# Patient Record
Sex: Male | Born: 1940 | Race: White | Hispanic: No | Marital: Married | Smoking: Never smoker
Health system: Southern US, Community
[De-identification: ages and names within clinical notes are randomized; demographics above are authoritative.]

## PROBLEM LIST (undated history)

## (undated) DIAGNOSIS — I2510 Atherosclerotic heart disease of native coronary artery without angina pectoris: Secondary | ICD-10-CM

## (undated) DIAGNOSIS — R410 Disorientation, unspecified: Secondary | ICD-10-CM

## (undated) DIAGNOSIS — Z9114 Patient's other noncompliance with medication regimen: Secondary | ICD-10-CM

## (undated) DIAGNOSIS — I4891 Unspecified atrial fibrillation: Secondary | ICD-10-CM

## (undated) DIAGNOSIS — Z91148 Patient's other noncompliance with medication regimen for other reason: Secondary | ICD-10-CM

## (undated) DIAGNOSIS — I219 Acute myocardial infarction, unspecified: Secondary | ICD-10-CM

## (undated) DIAGNOSIS — J90 Pleural effusion, not elsewhere classified: Secondary | ICD-10-CM

## (undated) DIAGNOSIS — I639 Cerebral infarction, unspecified: Secondary | ICD-10-CM

## (undated) DIAGNOSIS — M79606 Pain in leg, unspecified: Secondary | ICD-10-CM

## (undated) DIAGNOSIS — E785 Hyperlipidemia, unspecified: Secondary | ICD-10-CM

## (undated) DIAGNOSIS — E119 Type 2 diabetes mellitus without complications: Secondary | ICD-10-CM

## (undated) DIAGNOSIS — R600 Localized edema: Secondary | ICD-10-CM

## (undated) DIAGNOSIS — I255 Ischemic cardiomyopathy: Secondary | ICD-10-CM

## (undated) DIAGNOSIS — I10 Essential (primary) hypertension: Secondary | ICD-10-CM

## (undated) DIAGNOSIS — D369 Benign neoplasm, unspecified site: Secondary | ICD-10-CM

## (undated) DIAGNOSIS — G8929 Other chronic pain: Secondary | ICD-10-CM

## (undated) DIAGNOSIS — D7589 Other specified diseases of blood and blood-forming organs: Secondary | ICD-10-CM

## (undated) DIAGNOSIS — I5022 Chronic systolic (congestive) heart failure: Secondary | ICD-10-CM

## (undated) HISTORY — DX: Benign neoplasm, unspecified site: D36.9

## (undated) HISTORY — DX: Hyperlipidemia, unspecified: E78.5

## (undated) HISTORY — DX: Other specified diseases of blood and blood-forming organs: D75.89

## (undated) HISTORY — DX: Cerebral infarction, unspecified: I63.9

## (undated) HISTORY — DX: Ischemic cardiomyopathy: I25.5

## (undated) HISTORY — PX: CORONARY ARTERY BYPASS GRAFT: SHX141

## (undated) HISTORY — PX: TONSILLECTOMY: SUR1361

## (undated) HISTORY — DX: Unspecified atrial fibrillation: I48.91

---

## 2000-09-24 ENCOUNTER — Inpatient Hospital Stay (HOSPITAL_COMMUNITY): Admission: AD | Admit: 2000-09-24 | Discharge: 2000-10-09 | Payer: Self-pay | Admitting: Cardiology

## 2000-09-24 ENCOUNTER — Encounter: Payer: Self-pay | Admitting: *Deleted

## 2000-09-25 ENCOUNTER — Encounter: Payer: Self-pay | Admitting: *Deleted

## 2000-09-28 ENCOUNTER — Encounter: Payer: Self-pay | Admitting: Thoracic Surgery (Cardiothoracic Vascular Surgery)

## 2000-09-29 ENCOUNTER — Encounter: Payer: Self-pay | Admitting: Thoracic Surgery (Cardiothoracic Vascular Surgery)

## 2000-09-30 ENCOUNTER — Encounter: Payer: Self-pay | Admitting: Thoracic Surgery (Cardiothoracic Vascular Surgery)

## 2000-10-01 ENCOUNTER — Encounter: Payer: Self-pay | Admitting: Thoracic Surgery (Cardiothoracic Vascular Surgery)

## 2000-10-02 ENCOUNTER — Encounter: Payer: Self-pay | Admitting: Thoracic Surgery (Cardiothoracic Vascular Surgery)

## 2000-10-03 ENCOUNTER — Encounter: Payer: Self-pay | Admitting: Thoracic Surgery (Cardiothoracic Vascular Surgery)

## 2000-10-04 ENCOUNTER — Encounter: Payer: Self-pay | Admitting: Cardiothoracic Surgery

## 2000-10-17 ENCOUNTER — Inpatient Hospital Stay (HOSPITAL_COMMUNITY): Admission: EM | Admit: 2000-10-17 | Discharge: 2000-10-20 | Payer: Self-pay | Admitting: Cardiology

## 2000-11-04 ENCOUNTER — Encounter: Payer: Self-pay | Admitting: Thoracic Surgery (Cardiothoracic Vascular Surgery)

## 2000-11-04 ENCOUNTER — Encounter
Admission: RE | Admit: 2000-11-04 | Discharge: 2000-11-04 | Payer: Self-pay | Admitting: Thoracic Surgery (Cardiothoracic Vascular Surgery)

## 2006-07-27 ENCOUNTER — Ambulatory Visit: Payer: Self-pay | Admitting: Cardiology

## 2006-08-03 ENCOUNTER — Ambulatory Visit: Payer: Self-pay | Admitting: Cardiology

## 2006-09-03 ENCOUNTER — Ambulatory Visit: Payer: Self-pay | Admitting: Cardiology

## 2006-09-22 HISTORY — PX: CHOLECYSTECTOMY: SHX55

## 2009-09-15 ENCOUNTER — Encounter (INDEPENDENT_AMBULATORY_CARE_PROVIDER_SITE_OTHER): Payer: Self-pay | Admitting: Internal Medicine

## 2009-09-15 ENCOUNTER — Inpatient Hospital Stay (HOSPITAL_COMMUNITY): Admission: EM | Admit: 2009-09-15 | Discharge: 2009-09-18 | Payer: Self-pay | Admitting: Emergency Medicine

## 2009-09-15 ENCOUNTER — Ambulatory Visit: Payer: Self-pay | Admitting: Cardiology

## 2009-09-17 ENCOUNTER — Encounter (INDEPENDENT_AMBULATORY_CARE_PROVIDER_SITE_OTHER): Payer: Self-pay | Admitting: Internal Medicine

## 2009-09-18 ENCOUNTER — Encounter (INDEPENDENT_AMBULATORY_CARE_PROVIDER_SITE_OTHER): Payer: Self-pay | Admitting: Internal Medicine

## 2009-09-18 ENCOUNTER — Ambulatory Visit: Payer: Self-pay | Admitting: Vascular Surgery

## 2010-12-23 LAB — ETHANOL

## 2010-12-23 LAB — GLUCOSE, CAPILLARY
Glucose-Capillary: 231 mg/dL — ABNORMAL HIGH (ref 70–99)
Glucose-Capillary: 257 mg/dL — ABNORMAL HIGH (ref 70–99)
Glucose-Capillary: 268 mg/dL — ABNORMAL HIGH (ref 70–99)
Glucose-Capillary: 306 mg/dL — ABNORMAL HIGH (ref 70–99)
Glucose-Capillary: 309 mg/dL — ABNORMAL HIGH (ref 70–99)
Glucose-Capillary: 309 mg/dL — ABNORMAL HIGH (ref 70–99)
Glucose-Capillary: 321 mg/dL — ABNORMAL HIGH (ref 70–99)
Glucose-Capillary: 332 mg/dL — ABNORMAL HIGH (ref 70–99)
Glucose-Capillary: 334 mg/dL — ABNORMAL HIGH (ref 70–99)
Glucose-Capillary: 351 mg/dL — ABNORMAL HIGH (ref 70–99)
Glucose-Capillary: 406 mg/dL — ABNORMAL HIGH (ref 70–99)
Glucose-Capillary: 417 mg/dL — ABNORMAL HIGH (ref 70–99)

## 2010-12-23 LAB — LIPID PANEL
Cholesterol: 114 mg/dL (ref 0–200)
LDL Cholesterol: 49 mg/dL (ref 0–99)
Total CHOL/HDL Ratio: 2 RATIO
Total CHOL/HDL Ratio: 2.4 RATIO
Triglycerides: 62 mg/dL (ref <150)
Triglycerides: 84 mg/dL (ref <150)
VLDL: 12 mg/dL (ref 0–40)
VLDL: 17 mg/dL (ref 0–40)

## 2010-12-23 LAB — CK TOTAL AND CKMB (NOT AT ARMC)
CK, MB: 4.7 ng/mL — ABNORMAL HIGH (ref 0.3–4.0)
CK, MB: 5.9 ng/mL — ABNORMAL HIGH (ref 0.3–4.0)
Relative Index: 2.9 — ABNORMAL HIGH (ref 0.0–2.5)
Total CK: 183 U/L (ref 7–232)
Total CK: 205 U/L (ref 7–232)

## 2010-12-23 LAB — BASIC METABOLIC PANEL
CO2: 21 mEq/L (ref 19–32)
Calcium: 8.6 mg/dL (ref 8.4–10.5)
Chloride: 101 mEq/L (ref 96–112)
Chloride: 111 mEq/L (ref 96–112)
Creatinine, Ser: 2.07 mg/dL — ABNORMAL HIGH (ref 0.4–1.5)
Creatinine, Ser: 2.26 mg/dL — ABNORMAL HIGH (ref 0.4–1.5)
GFR calc Af Amer: 35 mL/min — ABNORMAL LOW (ref >60)
GFR calc Af Amer: 39 mL/min — ABNORMAL LOW (ref >60)
GFR calc Af Amer: 42 mL/min — ABNORMAL LOW (ref >60)
GFR calc non Af Amer: 29 mL/min — ABNORMAL LOW (ref >60)
GFR calc non Af Amer: 32 mL/min — ABNORMAL LOW (ref >60)
Potassium: 3.8 mEq/L (ref 3.5–5.1)
Sodium: 140 mEq/L (ref 135–145)

## 2010-12-23 LAB — URINALYSIS, ROUTINE W REFLEX MICROSCOPIC
Bilirubin Urine: NEGATIVE
Hgb urine dipstick: NEGATIVE
Ketones, ur: NEGATIVE mg/dL
Specific Gravity, Urine: 1.018 (ref 1.005–1.030)
Urobilinogen, UA: 0.2 mg/dL (ref 0.0–1.0)

## 2010-12-23 LAB — HEMOCCULT GUIAC POC 1CARD (OFFICE): Fecal Occult Bld: NEGATIVE

## 2010-12-23 LAB — CBC
HCT: 35.2 % — ABNORMAL LOW (ref 39.0–52.0)
HCT: 35.4 % — ABNORMAL LOW (ref 39.0–52.0)
Hemoglobin: 12.4 g/dL — ABNORMAL LOW (ref 13.0–17.0)
MCHC: 34.1 g/dL (ref 30.0–36.0)
MCHC: 34.3 g/dL (ref 30.0–36.0)
MCHC: 35 g/dL (ref 30.0–36.0)
MCHC: 35.3 g/dL (ref 30.0–36.0)
MCV: 107.6 fL — ABNORMAL HIGH (ref 78.0–100.0)
MCV: 108.3 fL — ABNORMAL HIGH (ref 78.0–100.0)
Platelets: 119 10*3/uL — ABNORMAL LOW (ref 150–400)
RBC: 3.24 MIL/uL — ABNORMAL LOW (ref 4.22–5.81)
RBC: 3.29 MIL/uL — ABNORMAL LOW (ref 4.22–5.81)
RDW: 13.1 % (ref 11.5–15.5)
RDW: 13.2 % (ref 11.5–15.5)
WBC: 7.2 10*3/uL (ref 4.0–10.5)
WBC: 8 10*3/uL (ref 4.0–10.5)
WBC: 9.4 10*3/uL (ref 4.0–10.5)

## 2010-12-23 LAB — COMPREHENSIVE METABOLIC PANEL
AST: 176 U/L — ABNORMAL HIGH (ref 0–37)
Albumin: 3.5 g/dL (ref 3.5–5.2)
BUN: 55 mg/dL — ABNORMAL HIGH (ref 6–23)
Calcium: 8.5 mg/dL (ref 8.4–10.5)
Creatinine, Ser: 2.68 mg/dL — ABNORMAL HIGH (ref 0.4–1.5)
Total Protein: 6.5 g/dL (ref 6.0–8.3)

## 2010-12-23 LAB — CARDIAC PANEL(CRET KIN+CKTOT+MB+TROPI)
Total CK: 156 U/L (ref 7–232)
Troponin I: 0.1 ng/mL — ABNORMAL HIGH (ref 0.00–0.06)

## 2010-12-23 LAB — FOLATE RBC: RBC Folate: 1727 ng/mL — ABNORMAL HIGH (ref 180–600)

## 2010-12-23 LAB — URINE CULTURE

## 2010-12-23 LAB — APTT: aPTT: 27 seconds (ref 24–37)

## 2010-12-23 LAB — TROPONIN I

## 2010-12-23 LAB — DIFFERENTIAL
Basophils Relative: 0 % (ref 0–1)
Lymphocytes Relative: 18 % (ref 12–46)
Lymphs Abs: 1.1 10*3/uL (ref 0.7–4.0)
Monocytes Absolute: 0.4 10*3/uL (ref 0.1–1.0)
Monocytes Relative: 7 % (ref 3–12)
Neutro Abs: 4.6 10*3/uL (ref 1.7–7.7)
Neutrophils Relative %: 73 % (ref 43–77)

## 2010-12-23 LAB — RAPID URINE DRUG SCREEN, HOSP PERFORMED
Benzodiazepines: NOT DETECTED
Opiates: NOT DETECTED

## 2010-12-23 LAB — AMMONIA: Ammonia: 31 umol/L (ref 11–35)

## 2010-12-23 LAB — PROTIME-INR: INR: 1.1 (ref 0.00–1.49)

## 2011-02-07 NOTE — Op Note (Signed)
[place]. [facility]  Patient:    Chris [name], Chris [name]                          MRN: [id number] Proc. Date: 09/28/00 Adm. Date:  [id number] Attending:  Learta Codding CC:         Veneda Melter, M.D. 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  OPERATION PERFORMED:  Median sternotomy, extracorporeal circulation, coronary artery bypass grafting x 4 (left internal mammary artery to left anterior descending, saphenous vein graft to first diagonal, sequential saphenous vein graft to posterior descending and posterolateral #2).  SURGEON:  Salvatore Decent. Dorris Fetch, M.D.  ASSISTANT:  Maxwell Marion, RN, FA  ANESTHESIA:  General.  FINDINGS:  Cardiomegaly with biventricular hypertrophy, dilatation, anterior apical scar, inferior scar, left anterior descending fair quality, small target. Main target is good quality but overall diffusely diseased coronaries.  INDICATIONS FOR PROCEDURE:  Chris [name] is a 70 year old diabetic gentleman with known three-vessel coronary disease who presented status post an acute myocardial infarction.  Cardiac catheterization revealed progression of three-vessel disease now with left main disease as well. There was ischemic cardiomyopathy with an ejection fraction of 30% and end diastolic pressure of 30.  The patient was referred for coronary artery bypass grafting. He was moderately high risk secondary to his comorbidities and his cardiac dysfunction.  The indications, risks, benefits and alternatives to the procedure were discussed in detail with the patient and his family.  They accepted the risks as outlined in his chart and agreed to proceed.  DESCRIPTION OF PROCEDURE:  Chris [name] was brought to the preop holding area on September 28, 2000.  Lines were placed to monitor arterial, central venous  and pulmonary arterial pressure.  EKG leads were placed for continuous telemetry. The patient was taken to the operating room, anesthetized and intubated.  A Foley catheter was placed.  Intravenous antibiotics were administered and the chest, abdomen and legs were prepped and draped in the usual fashion.  A median sternotomy was performed. Simultaneously incision was made in the medial aspect of the right leg and the greater saphenous vein was harvested in a standard fashion from the groin to the knee.  Prior to making incisions, a left femoral arterial line had been placed using a Seldinger technique.  The left internal mammary artery was harvested using standard technique.  The patient had a very stiff, noncompliant chest wall.  Mammary was of good quality and was harvested without undue difficulty.  The patient was fully heparinized prior to dividing the distal end of the mammary artery.  There was good flow through the cut end of the vessel.  The mammary artery was placed in a papaverine soaked sponge and placed into the above pleural space.  The pericardium was opened.  The ascending aorta was inspected.  There was no palpable atherosclerotic disease.  The aorta was cannulated via a concentric 2-0 Ethibond pledgeted pursestring sutures.  The dual stage venous cannula was placed via pursestring suture in the right atrial appendage.  Cardiopulmonary bypass was instituted and the patient was cooled to 32 degrees Celsius.  The coronary arteries were inspected and anastomotic sites were chosen.  The conduits were inspected and cut to length and a foam pad was placed in the pericardium to protect the phrenic nerve.  A temperature probe was placed in the myocardial  septum and a cardioplegia cannula was placed in the ascending aorta.  The sites chosen for grafting were the posterior descending vessel awa the large second posterolateral, large anterolateral diagonal branch and the LAD.  The  OM1 from the circumflex was too small to graft.  All vessels that had diffuse disease.  Only the LAD was significantly diseased at the site of the anastomosis.  The aorta was crossclamped.  The left ventricle was ____________ aortic root vent.  Cardiac arrest was then achieved with a combination of cold antegrade blood cardioplegia and topical iced saline.  After achieving a complete diastolic arrest and myocardial septal temperature of 10 degrees Celsius, the following distal anastomoses were performed.  First a reversed saphenous vein was placed sequentially to the posterior descending and second posterolateral branch.  The posterior descending was a 1.5 mm vessel.  It was good quality at the site of the anastomosis.  A side-to-side anastomosis was performed to the vein graft using a running 7-0 Prolene suture.  There was excellent flow through this anastomosis.  An end-to-side anastomosis then was performed of PL2 again using a running 7-0 Prolene suture.  Again PL2 was also 1.5 mm and good quality at the site of the anastomosis although it did have some significant distal disease.  At the completion of the second anastomosis, cardioplegia was administered down the graft.  There was good flow and good hemostasis.  Next, a reversed saphenous vein graft was placed end-to-side to the first diagonal branch of the LAD.  This was a large dominant anterolateral vessel which along with the popsterolateral branch supplied most of the lateral wall of the heart.  It was 1.5 mm in diameter at the site of the anastomosis.  It bifurcated distal to the anastomosis.  A 1.5 mm probe would only pass down one limb of the bifurcation.  The vessel was good quality at the site where the anastomosis was performed.  However, the anastomosis was done with a running 7-0 Prolene suture.  There was very good flow through the graft.  Cardioplegia was administered and there was good hemostasis.  Next, the left  internal mammary artery was brought through a window in the  pericardium anterior to the left phrenic nerve.  The distal end of the mammary was spatulated and was anastomosed end-to-side to the mid-LAD using a running 8-0 Prolene suture.  The LAD was only fair quality at the site of the anastomosis.  There was lateral plaque.  There was significant disease both proximal and distal to the anastomosis but a 1 mm probe did pass in both directions.  The anastomosis was performed with a running 8-0 Prolene suture. At the completion of the anastomosis, a bulldog clamp was removed.  Immediate and rapid septal rewarming was noted.  There was a small leak from the heel which was controlled with a single 8-0 Prolene suture.  The mammary pedicle was tacked to the epicardial surface of the heart with 6-0 Prolene sutures. The aortic crossclamp was removed.  Lidocaine was administered.  The total crossclamp time was 45 minutes.  The patient required two defibrillations with 20 joules before resuming sinus rhtyhm.  The vein grafts were cut to length.  A partial occlusion clamp was placed on the ascending aorta.  The cardioplegia cannula was removed.  The proximal vein graft anastomoses were performed at 4.4 mm punch aortotomies with running 6-0 Prolene sutures.  At the completion of the final proximal anastomosis, the patient was placed in Trendelenburg position.  Air was allowed to vent.  The partial clamp was removed.  Air was aspirated from the vein grafts and the bulldog clamps were removed.  All proximal and distal anastomoses were inspected for hemostasis.  The patient was loaded with milrinone during the proximal anastomoses and also rewarming was begun.  A dopamine drip was initiated just before coming off bypass.  Epicardial pacing wires were placed on the right ventricle and right atrium after the patient had been rewarmed to 37 degrees Celsius and all anastomoses were inspected  for hemostasis.  The patient was weaned from cardiopulmonary bypass on milrinone and dopamine drips and milrinone was at 0.3 and dopamine at 5 mcg/kg per minute.  The total bypass time was 106 minutes.  Transesophageal echocardiogram performed postbypass revealed no change in mild aortic insufficiency and mitral regurgitation.  There was slight improvement of ventricular function consistent with the ionotropic support.  A test dose of protamine was administered and was well tolerated.  The atrial and aortic cannulae were removed.  The remainder of the protamine was administered without incident.  The chest was irrigated with 1L of warm normal saline containing 1 gm of vancomycin.  Hemostasis was achieved.  The pericardium was reapproximated with interrupted 3-0 silk sutures.  The chest was drained with left pleural and two mediastinal chest tubes.  The sternum was closed with heavy gauge interrupted stainless steel wires.  The pectoralis fascia was closed wtih a running #1 Vicryl suture.  The subcutaneous tissues in both incisions was closed with running 2-0 Vicryl suture and the skin was closed with 3-0 Vicryl sutures.  Sponge, needle and instrument counts were correct at the end of the procedure.  The patient was in stable condition and was transported to the surgical intensive care unit. DD:  09/28/00 TD:  09/28/00 Job: [id number]

## 2011-02-07 NOTE — Procedures (Signed)
[place]. [facility]  Patient:    Chris [name], Chris [name]                          MRN: [id number] Proc. Date: 10/02/00 Adm. Date:  [id number] Attending:  Charlett Lango CC:         Anesthesia Department   Procedure Report  PROCEDURE:  Transesophageal echocardiographic monitoring during coronary artery bypass grafting.  BODY OF REPORT:  I was consulted by Dr. Andrey Spearman to place a transesophageal echocardiographic probe into Chris [name] because of a decreased LV function.  The patient was undergoing coronary artery bypass grafting and with films indicated to monitoring using the transesophageal echocardiographic probe during the surgery.  After uneventful monitoring line insertion, induction of anesthesia and endotracheal intubation, I passed the transesophageal echocardiographic probe easily into the stomach.  Initial short-axis view of the left ventricle showed some anterior hypokinesis, especially down towards the apex.  Other quadrants seemed to move pretty well.  Mitral valve was normal.  Aortic valve was normal.  Left ventricular outflow tract was normal.   Right side of the heart was normal.  Interatrial septum was normal.  Flow in the pulmonary veins was forward.  Patient was placed on cardiopulmonary bypass by Dr. Dorris Fetch and after the procedure was completed and prior to discontinuing cardiopulmonary bypass, the left ventricle looked unchanged from preoperatively.  The rest of the structures were within normal limits.  Cardiopulmonary bypass was discontinued uneventfully and the rest of the course was uneventful.  At the end of the procedure, the echocardiographic probe was removed from the patient and he was taken to the intensive care unit. DD:  10/02/00 TD:  10/03/00 Job: [id number]

## 2011-02-07 NOTE — Discharge Summary (Signed)
[place]. [facility]  Patient:    Chris [name], Chris [name]                          MRN: [id number] Adm. Date:  [id number] Attending:  Charlett Lango Dictator:   Sherrie George, P.A. CC:         Weyman Pedro, M.D.  Veneda Melter, M.D. [facility]   Referring Physician Discharge [place]  DATE OF BIRTH:  1941-03-30  ADMITTING PHYSICIAN:  Lewayne Bunting, M.D.  PRIMARY CARE PHYSICIAN:  Weyman Pedro, M.D.  CARDIOLOGIST OF RECORD:  Veneda Melter, M.D.  ADMISSION DIAGNOSES: 1. Chest pain, rule out myocardial infarction, status post myocardial    infarction 1993. 2. Adult onset diabetes mellitus x 50 years with a history of poor control. 3. Diabetic retinopathy. 4. Hypertension. 5. Hyperlipidemia. 6. Gastroesophageal reflux disease. 7. Questionable cerebrovascular accident with no residual in 1994.  DISCHARGE DIAGNOSES: 1. Acute myocardial infarction with three-vessel coronary artery disease;    ejection fraction 33%, 99% and 100% right coronary artery stenoses, 50%    left main, 80% left anterior descending artery, subtotal left circumflex    after the first obtuse marginal. 2. Adult onset diabetes mellitus x 50 years with poor control. 3. Diabetic retinopathy, diabetic gastroparesis, and bilateral diabetic    peripheral neuropathy. 4. Hypertension. 5. Gastroesophageal reflux disease. 6. Postoperative acute tubular necrosis, resolving, with creatinine elevation    up to 3.5. 7. Hyperlipidemia.  PROCEDURES: 1. Cardiac catheterization on September 23, 2000 - Dr. Andee Lineman. 2. Coronary artery bypass grafting x 4 with left internal mammary to the LAD,    saphenous vein graft to the diagonal, saphenous vein graft to the    posterior descending and posterolateral on September 29, 2000 by    Dr. Dorris Fetch.  BRIEF HISTORY:  The patient is a 70 year old white male transferred to [facility] from [facility] where he was evaluated in the ER for chest pain.  He had  a significant myocardial infarction in 1993.  He was catheterized at [facility] and CABG was recommended; however, the patient declined, opting for medical therapy.  He also has a history of hyperlipidemia, gastroesophageal reflux disease, tonsillectomy, hypertension, retinopathy, and peripheral vascular occlusive disease.  Patient states no heart trouble since 1993 until the night prior to admission.  He thought he had indigestion.  He woke up around 1 a.m. and took Axid without relief.  He got up and walked around.  The pain continued as a dull ache to the right of the sternum, painful to touch.  He did not take nitroglycerin.  He had shortness of breath, mild nausea, and diaphoresis.  ALLERGIES:  None known.  MEDICATIONS ON ADMISSION: 1. A.m. insulin is NPH 62 and 10 of regular. 2. P.m. insulin is NPH 24 and 10 of regular. 3. Niacin 500 mg t.i.d. 4. Captopril 100 mg p.o. b.i.d. 5. Pravachol 10 mg q.d. 6. Multivitamin. 7. Lasix 10 mg Saturday, Sunday, Monday, Wednesday, and Friday. 8. Aspirin 325 mg q.d. 9. Lopressor 100 mg b.i.d.  For further history and physical, please see the dictated note.  HOSPITAL COURSE:  Patient was admitted for observation and further treatment. CPKs were positive despite no significant changes on the EKG.  Patient was judged to have a subendocardial myocardial infarction and underwent cardiac catheterization with the above-noted results and an ejection fraction of 33%. At that point, patient was referred to CVTS and Dr. Charlett Lango for evaluation.  He evaluated  the patient, noted that with a 50% left main and unstable angina along with three-vessel disease, the best option would be the coronary artery bypass grafting.  The risks and benefits were discussed in detail and informed operative consent was obtained.  The patient was stabilized and then taken to the operating room on September 28, 2000.  Patient tolerated the procedure well and  returned to the ICU.  During transportation, he became hypotensive with systolic blood pressures down to the 40s to 50s and hypoxic with low saturations.  Heart rate was in the 70 to 80 range and sinus. It was noted that the sleeve had been pulled out of the IJA.  He was resuscitated by Dr. Dorris Fetch and stabilized overnight.  First postoperative morning, cardiac index was 2.5, urine output was 30-40 cc/hour.  Creatinine was up to 2.7, potassium was 5.  Hemoglobin was 9.3, hematocrit 24.6.  Patient was kept in the ICU for monitoring for ATN and was treated with diuretics.  He showed slow, steady improvement over the next several days after his creatinine peaked at 3.5 on postoperative day #2.  He also had some postoperative GI problems secondary to his chronic gastroparesis.  This was treated with Reglan and he showed slow, steady improvement with this.  Insulin control was also a problem and this was initially treated using ???Glucomander protocol and then as he was taking p.o. intake, his insulin was increased with his p.o. intake based on daily CBGs.  Postoperatively, the patient also developed atrial fibrillation.  This was initially treated with amiodarone and Cardizem drips.  He converted and was maintained on amiodarone.  He was also placed back on his beta blockers.  Patient has a bilateral peripheral neuropathy and a rolling walker was obtained along with a PT consult was requested but I cannot see where they have actually seen the patient.  A home health restorative nurse has been requested.  Patient has shown steady improvement by October 08, 2000.  His wounds are healing nicely.  He has some mild edema in his lower extremities although he is below his preoperative weight at 250 - his preoperative weight was 255.  He is alert, he is tolerating his diet well on the Reglan.  His glucoses are markedly improved with CBGs now in the range of 70 to 136 this a.m.  Overall, he is doing  well.  His telemetry shows sinus rhythm and his blood pressure is quite stable in the range of 130-150 systolic.  O2 saturations on room air are 93% and it was Dr. Robby Sermon opinion if he continues to do well, he could be discharged in the a.m. October 09, 2000.  He has been followed on a daily basis by Dr. Chales Abrahams, who agrees, and has increased his ACE today.  DISCHARGE MEDICATIONS:  At this point are as follows:  1. Metamucil at home as before.  2. Cordarone 200 mg q.12h.  3. Lopressor 100 mg q.12h.  4. NPH insulin 62 units in the a.m. and 22 units in the p.m.  5. Regular insulin 10 units in the a.m. and 8 units in the p.m.  6. Coated aspirin 81 mg q.d.  7. Altace 5 mg q.d.  8. Reglan 10 mg one before meals and h.s.  9. Protonix 40 mg p.o. q.d. 10. Tylox 1-2 p.o. q.4h. p.r.n.  ACTIVITY:  Patient is instructed to walk daily.  No lifting over 10 pounds, no driving, no strenuous activity.  DIET:  He is to maintain a diabetic  diet.  WOUND CARE:  Clean his incisions with plain soap and water.  FOLLOW-UP:  He is to see Dr. Chales Abrahams in two weeks and bring a chest x-ray from Dr. Donne Hazel office for Dr. Dorris Fetch to view.  Our office will arrange for follow-up in three weeks to see Dr. Dorris Fetch.  DISCHARGE LABORATORY:  Sodium 143, potassium 4.0, chloride 102, CO2 33, BUN 20, creatinine 1.6, glucose 89.  Hemoglobin 10.4, hematocrit 31.2, white count 9.0.  Platelets are 366,000.  CONDITION ON DISCHARGE:  Improved. DD:  10/08/00 TD:  10/09/00 Job: [id number]

## 2011-02-07 NOTE — Assessment & Plan Note (Signed)
[facility]                          [facility] OFFICE NOTE   Chris [name], Chris [name]                      MRN:          [id number]  DATE:09/03/2006                            DOB:          1941-03-02    HISTORY OF PRESENT ILLNESS:  The patient is a 70 year old male with a  history of ischemic heart disease status post coronary bypass grafting.  The patient was hospitalized on July 28, 2006 with palpitations. The  patient was ruled out for myocardial infarction. He underwent stress  testing which demonstrated an ejection fraction of 34%. The study was  interrupted by Dr. Diona Browner. There was a moderately large inferolateral  defect which was nonreversible. The ejection fraction by  echocardiography however was about 50-55%. The patient subsequently was  referred for a CardioNet monitor which showed occasional episodes of  nonsustained ventricular tachycardia. Ectopy varied between 7-12 beats  in a row. There was no sustained ventricular tachycardia however.  Overall the CardioNet monitor showed normal sinus rhythm with relatively  infrequent ectopy. The patient has been doing quite well. He however has  symptoms consistent with gallbladder disease. He states that he has  borborygmi after heavy meals and has frequent abdominal distention. He  is scheduled to undergo cholecystectomy. From a cardiac perspective  however he is stable with no chest pain or shortness of breath. He is  currently an NYHA class 2.   MEDICATIONS:  1. Metoprolol 50 mg p.o. b.i.d.  2. Aspirin 325 daily.  3. Niacin 250 b.i.d.  4. Niacin 5 mg p.o. daily.  5. Fish Oil 1000 mg p.o. daily.  6. Humulin insulin.  7. Lisinopril 40 mg a day.  8. Lovastatin 40 mg a day.  9. Magnesium 250 mg p.o. daily.  10.Lasix p.r.n.   PHYSICAL EXAMINATION:  VITAL SIGNS:  Blood pressure is 162/70, heart  rate 69 beats per minute. Weight is 230 pounds.  NECK:  Reveals normal carotid upstroke  and no carotid bruits.  LUNGS:  Clear breath sounds bilaterally.  HEART:  Regular rate and rhythm. Normal S1, S2. No murmurs, rubs or  gallops.  ABDOMEN:  Soft.  EXTREMITIES:  No cyanosis or clubbing or edema.   PROBLEM LIST:  1. Palpitations, stable.      a.     Infrequent nonsustained ventricular tachycardia by CardioNet       monitor.      b.     Ruled out for paroxysmal atrial fibrillation.      c.     Normal ejection fraction by echocardiography (50-55%).  2. Coronary artery disease.      a.     Status post coronary artery bypass grafting 2007 by Dr.       Dorris Fetch.      b.     Cardiolite November 2007 with fixed defect inferolateral       wall but no ischemia.  3. Diabetes mellitus.  4. Chronic cholecystitis requiring cholecystectomy.  5. Left pleural effusion.  6. Chronic renal insufficiency, creatinine 1.7 to 1.8.   PLAN:  1. From a cardiovascular perspective, the patient appears to  be stable      to undergo noncardiac surgery i.e. gallbladder surgery. The patient      had no significant ischemia by Cardiolite stress testing.  2. From a perspective palpitations, the patient appears to be stable      and continue on his current medical regimen. In light of his      relatively normal ejection fraction, no additional medical therapy      is required for his nonsustained ventricular tachycardia. EF is      lower on nuclear imaging. I will review 2DECHO to make sure the EF      has been accurately reported.  3. The patient will followup with [place] in a couple of months.     Learta Codding, MD,FACC  Electronically Signed    GED/MedQ  DD: 09/03/2006  DT: 09/03/2006  Job #: [id number]   cc:   Weyman Pedro

## 2011-02-07 NOTE — Discharge Summary (Signed)
[place]. [facility]  Patient:    Chris [name], Chris [name]                          MRN: [id number] Adm. Date:  [id number] Disc. Date: [id number] Attending:  Charlett Lango Dictator:   Maxwell Marion, RNFA CC:         Veneda Melter, M.D. [facility]  Weyman Pedro, M.D.   Discharge Summary  DATE OF BIRTH:  11-03-2040  ADDENDUM:  Additional medications on discharge include Lasix 40 mg p.o. q.d. and potassium chloride 10 mEq p.o. q.d.  FOLLOW-UP:  [name] has an appointment to see his cardiologist, Veneda Melter, M.D., on October 26, 2000.  He has an appointment to see Salvatore Decent. Dorris Fetch, M.D., on November 04, 2000.  He has been instructed to call his primary care [name], that is Weyman Pedro, M.D., and arrange to have blood work drawn next week for a BMET to follow his creatinine and his potassium. DD:  10/09/00 TD:  10/11/00 Job: [id number]

## 2011-02-07 NOTE — Discharge Summary (Signed)
[place]. [facility]  Patient:    Chris [name], Chris [name]                          MRN: [id number] Adm. Date:  [id number] Disc. Date: 10/20/00 Attending:  Talitha Givens Dictator:   Tereso Newcomer, P.A. CC:         [facility]             Salvatore Decent. Dorris Fetch, M.D.             Weyman Pedro, M.D.                           Discharge Summary  DISCHARGE DIAGNOSES: 1. Left pleural effusion, status post thoracentesis by Dr. Dorris Fetch    on October 19, 2000. 2. Chest pain probably secondary to #1, enzymes negative for myocardial    infarction. 3. Coronary artery disease. 4. Status post coronary artery bypass graft on September 28, 2000, by Dr.    Dorris Fetch.  Left internal mammary artery to left anterior descending,    saphenuos vein graft to diagonal, saphenuos vein graft to posterior    descending and posterolateral. 5. Renal insufficiency. 6. Insulin-dependent diabetes mellitus. 7. Hyperlipidemia. 8. Postoperative atrial fibrillation on amiodarone and Lopressor. 9. Chest tube wound infection.  PROCEDURE:  Thoracentesis for left pleural effusion by Dr. Dorris Fetch on October 11, 2000.  HISTORY OF PRESENT ILLNESS:  This 70 year old male was transferred from [facility] on October 17, 2000.  On that day he became apprehensive and could not rest or lie down and finally called Dr. Eliberto Ivory who advised him to go to the emergency room.  He had some chest pain and arm pain different than his preoperative pain.  He was transferred to [facility] for further evaluation.  PHYSICAL EXAMINATION:  GENERAL: A well-developed, well-nourished male in no acute distress.  VITAL SIGNS:  Blood pressure 130/80.  HEART: Normal sinus rhythm, heart rate 75.  NECK:  Without JVD.  CHEST: Decreased breath sounds, but clear.  ABDOMEN: No masses.  EXTREMITIES: Trace edema on the left and 2+ on the right.  EKG from [facility] revealed normal sinus rhythm with no acute  changes.  LABORATORY DATA:  Sodium 131, potassium 4.4, chloride 101, CO2 26, glucose 211, BUN 25, creatinine 1.8.  Cardiac enzymes; total CK 72, CK-MB 5.8, troponin I 0.04.  HOSPITAL COURSE:  The patient was admitted and ruled out for myocardial infarction by enzymes.  He was still feeling somewhat uncomfortable on October 18, 2000.  His examination was notable for decreased breath sounds in the left base and possible egophony.  Dr. Dorris Fetch saw the patient on October 18, 2000, to examine his chest tube sites.  The patient had been started on Keflex upon admission.  On initial physical examination the wounds were noted to be draining.  Dr. Dorris Fetch agreed with the Keflex and made recommendations on wound cleaning.  Chest x-ray revealed left lower effusion.  On October 19, 2000, Dr. Dorris Fetch performed thoracentesis.  Follow-up chest x-ray on October 20, 2000, revealed no pneumothorax, significant reduction in left pleural effusion, postthoracentesis persistent small bilateral effusions, left lower lobe atelectasis, with mild interstitial edema.  On the morning of January 29, it was felt that the patient was stable enough for discharge to home.  He will remain on diuretics for one week.  He also requested follow-up in our office in [place] instead of [place].  Upon admission it was noted that the patient was somewhat anxious because he was no longer on Pravachol and Niacin.  His old chart was reviewed and it looks like this medication was left off his medication list at discharge. Therefore, the patient has been advised about restarting this medication at his follow-up appointment next week.  OTHER LABS:  Prior to discharge; Sodium 139, potassium 3.8, chloride 103, CO2 27, BUN 19, creatinine 1.8, glucose 64.  DISCHARGE MEDICATIONS:  1. Keflex 500 mg t.i.d. for a total of 10 days.  2. Cordarone 200 mg b.i.d.  3. Lopressor 100 mg 1/2 tablet b.i.d.  4. Insulin NPH and Regular  as previously taken.  5. Coated aspirin 325 mg q.d.  6. Altace 5 mg q.d.  7. Protonix 40 mg q.d.  8. Lasix 40 mg 1/2 tablet q.d.  9. Reglan 10 mg t.i.d. with meals. 10. Nitroglycerin 0.4 mg sublingual p.r.n. chest pain.  ACTIVITY:  As tolerated.  DIET:  Low fat, low sodium, diabetic diet.  WOUND CARE:  The patient should keep his chest tube sites clean daily with peroxide and to cover them with a dry dressing.  He has been advised to stop taking his K-Dur.  As noted above, he is to ask about restarting his Pravachol and Niacin at his follow-up appointment next week.  Follow-up is with Dr. Myrtis Ser on Wednesday, October 28, 2000, at 3 p.m. in [place], [place].  He is to see Dr. Dorris Fetch on February 13, as scheduled previously. DD:  10/20/00 TD:  10/20/00 Job: [id number]

## 2011-02-07 NOTE — Procedures (Signed)
[place]. [facility]  Patient:    Chris [name], Chris [name]                          MRN: [id number] Proc. Date: 09/24/00 Attending:  Doylene Canning. Ladona Ridgel, M.D. [facility] CC:         Weyman Pedro, M.D., [facility]  Veneda Melter, M.D. [facility]   Procedure Report  CLINICAL NOTE:  Chris [name] is a very pleasant 70 year old man with juvenile-onset diabetes beginning at age 27, who is referred for left heart catheterization after presenting with congestive heart failure and chest pain. The patients history dates back to 1993, when he underwent heart catheterization at the [facility] and told that he needed bypass surgery.  He decided to proceed with medical therapy instead and did very well for over seven years.  Over the last week or so, he has had increasing dyspnea and yesterday developed fairly atypical chest pain radiating to his right arm and right shoulder.  He is admitted to the hospital, now referred for catheterization.  DESCRIPTION OF PROCEDURE:  After informed consent was obtained, the patient was taken to the diagnostic catheterization lab in the fasting state.  After the usual preparation and draping, intravenous Versed was given for sedation. After 20 cc of lidocaine was infiltrated in the right groin region, the 7 [place] hemostatic sheath was placed in the right femoral artery.  The left Judkins catheter was inserted percutaneously through the sheath and advanced into the left main coronary artery.  Selective coronary angiography of the left main system was then carried out.  The left Judkins catheter was removed, and the right Judkins catheter was inserted percutaneously through the right femoral artery hemostatic sheath and advanced into the right coronary artery. The right Judkins catheter was not successful in cannulating the right coronary artery and it was removed, and the no-torque catheter was subsequently inserted and placed in the right coronary artery.  The  no-torque catheter was removed from the right coronary artery, and the Judkins catheter was inserted retrograde across the aortic valve.  Left ventriculography was then carried out.  At this point, the catheters were removed, hemostasis was assured, and the patient was returned to his room in good condition.  COMPLICATIONS:  None.  RESULTS:  A. HEMODYNAMICS:  The LV pressure was 175/30.  The aortic pressure was 167/83. Pulmonary capillary wedge pressure was 30.  B. LEFT VENTRICULOGRAPHY:  Left ventriculography was performed in the RAO projection with a total of 36 cc of contrast injected at a rate of 12 cc/sec. This demonstrated a quantitative ejection fraction of 33% with global hypokinesis and posterobasal akinesis.  C. CORONARY ANGIOGRAPHY:  The left main coronary artery had a 60% distal stenosis.  It gave rise to a left circumflex coronary artery, which was subtotally occluded after a first obtuse marginal branch.  The distal left circumflex was not observed.  The left anterior descending coronary artery had an 80% proximal stenosis, which was very long and tubular.  It gave rise to a very large first diagonal branch, which had an 80% ostial stenosis and an 80% mid-stenosis.  This diagonal branch went nearly to the lateral apex of the left ventricle.  The second diagonal branch was a small vessel with diffuse disease, as was the mid- and distal LAD.  During injection of the left main coronary artery, there was noted to be very profound left-to-right collaterals supplying the posterior descending artery as well as posterolateral  branches of the right coronary artery.  The right coronary artery was occluded in the proximal section.  There was a 99% ostial stenosis as well.  There were right-to-right collaterals to the distal RCA.  CONCLUSION:  This study demonstrates severe three-vessel coronary disease with left main coronary artery disease.  It also demonstrates moderately  depressed LV systolic function with global hypokinesis and posterobasal akinesis.  In addition, there was a modest increase in the pulmonary capillary wedge pressure after 175 cc of IV contrast had been infused.  At this time, we would recommend consultation for surgical evaluation. DD:  09/24/00 TD:  09/24/00 Job: [id number]

## 2011-04-07 ENCOUNTER — Encounter: Payer: Self-pay | Admitting: *Deleted

## 2011-04-11 ENCOUNTER — Ambulatory Visit: Payer: Self-pay | Admitting: Cardiovascular Disease

## 2011-05-07 ENCOUNTER — Ambulatory Visit (INDEPENDENT_AMBULATORY_CARE_PROVIDER_SITE_OTHER): Payer: Self-pay | Admitting: Internal Medicine

## 2011-07-28 ENCOUNTER — Ambulatory Visit (INDEPENDENT_AMBULATORY_CARE_PROVIDER_SITE_OTHER): Payer: Self-pay | Admitting: Internal Medicine

## 2011-12-12 ENCOUNTER — Ambulatory Visit (INDEPENDENT_AMBULATORY_CARE_PROVIDER_SITE_OTHER): Payer: Medicare Other | Admitting: Urology

## 2011-12-12 DIAGNOSIS — N401 Enlarged prostate with lower urinary tract symptoms: Secondary | ICD-10-CM

## 2011-12-12 DIAGNOSIS — R972 Elevated prostate specific antigen [PSA]: Secondary | ICD-10-CM

## 2012-03-12 ENCOUNTER — Ambulatory Visit: Payer: Medicare Other | Admitting: Urology

## 2012-10-05 DIAGNOSIS — R079 Chest pain, unspecified: Secondary | ICD-10-CM

## 2012-10-06 DIAGNOSIS — R002 Palpitations: Secondary | ICD-10-CM

## 2012-10-06 DIAGNOSIS — R079 Chest pain, unspecified: Secondary | ICD-10-CM

## 2012-10-06 DIAGNOSIS — I2589 Other forms of chronic ischemic heart disease: Secondary | ICD-10-CM

## 2012-10-07 ENCOUNTER — Emergency Department (HOSPITAL_COMMUNITY)
Admission: EM | Admit: 2012-10-07 | Discharge: 2012-10-07 | Disposition: A | Discharge status: Home / Self Care | Payer: Medicare Other | Attending: Emergency Medicine | Admitting: Emergency Medicine

## 2012-10-07 ENCOUNTER — Encounter (HOSPITAL_COMMUNITY): Payer: Self-pay | Admitting: *Deleted

## 2012-10-07 DIAGNOSIS — Z862 Personal history of diseases of the blood and blood-forming organs and certain disorders involving the immune mechanism: Secondary | ICD-10-CM | POA: Insufficient documentation

## 2012-10-07 DIAGNOSIS — Z87448 Personal history of other diseases of urinary system: Secondary | ICD-10-CM | POA: Insufficient documentation

## 2012-10-07 DIAGNOSIS — Z79899 Other long term (current) drug therapy: Secondary | ICD-10-CM | POA: Insufficient documentation

## 2012-10-07 DIAGNOSIS — Z8679 Personal history of other diseases of the circulatory system: Secondary | ICD-10-CM | POA: Insufficient documentation

## 2012-10-07 DIAGNOSIS — Z794 Long term (current) use of insulin: Secondary | ICD-10-CM | POA: Insufficient documentation

## 2012-10-07 DIAGNOSIS — E119 Type 2 diabetes mellitus without complications: Secondary | ICD-10-CM | POA: Insufficient documentation

## 2012-10-07 DIAGNOSIS — Z951 Presence of aortocoronary bypass graft: Secondary | ICD-10-CM | POA: Insufficient documentation

## 2012-10-07 DIAGNOSIS — R339 Retention of urine, unspecified: Secondary | ICD-10-CM | POA: Insufficient documentation

## 2012-10-07 DIAGNOSIS — Z8639 Personal history of other endocrine, nutritional and metabolic disease: Secondary | ICD-10-CM | POA: Insufficient documentation

## 2012-10-07 LAB — URINALYSIS, MICROSCOPIC ONLY
Ketones, ur: NEGATIVE mg/dL
Leukocytes, UA: NEGATIVE
Nitrite: NEGATIVE
Protein, ur: NEGATIVE mg/dL
Urobilinogen, UA: 0.2 mg/dL (ref 0.0–1.0)

## 2012-10-07 NOTE — ED Notes (Signed)
Pt states after he received IV lasix this morning, he could not urinate; pt states he is more comfortable and denies pressure after foley catheter placed

## 2012-10-07 NOTE — ED Provider Notes (Signed)
History  This chart was scribed for Donnetta Hutching, MD by Shari Heritage, ED Scribe. The patient was seen in room APA10/APA10. Patient's care was started at 1428.  CSN: [id number]  Arrival date & time 10/07/12  1233   First MD Initiated Contact with Patient 10/07/12 1428      Chief Complaint  Patient presents with  . Urinary Retention     The history is provided by the patient. No language interpreter was used.    HPI Comments: Chris [name] is a 72 y.o. male who presents to the Emergency Department complaining of urinary retention onset yesterday. Patient says that he was admitted to [facility] with chest pain, diffuse swelling to face, neck and abdomen 2 days ago. He was given IV lasix likely due to fluid in lungs. Patient is now presenting with what appears to be BPH. He denies any pain or feeling of bladder fullness. Patient's wife says that he has never had this problem before. Patient signed out AMA from [facility] more than 1 hour ago. 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 Surgical history includes CABG (12 years ago). History provided by patient, wife and grandson, but many elements were vague and missing details.  Past Medical History  Diagnosis Date  . 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 Acute renal insufficiency     resolved  . Diabetes mellitus   . Macrocytosis     normal B12 & folate levels  . Ischemic cardiomyopathy     EF of 45-50% - posterior & inferior walls are severly hypokinetic  . Atrial fibrillation     not candidate for chronic anticoagulation with Coumadin given chrronic epistaxis as well as hemorrhagic conversion of his acute cva   . Hyperlipidemia     Past Surgical History  Procedure Date  . Cholecystectomy   . Coronary artery bypass graft   . Tonsillectomy     No family history on file.  History    Substance Use Topics  . Smoking status: Former Games developer  . Smokeless tobacco: Not on file  . Alcohol Use: No      Review of Systems A complete 10 system review of systems was obtained and all systems are negative except as noted in the HPI and PMH.   Allergies  Ciprofloxacin and Xanax  Home Medications   Current Outpatient Rx  Name  Route  Sig  Dispense  Refill  . INSULIN ISOPHANE HUMAN 100 UNIT/ML [place] SUSP      48 Units in the morning & 6 Units every evening            . INSULIN REGULAR HUMAN 100 UNIT/ML IJ SOLN   Subcutaneous   Inject 8 Units into the skin 2 (two) times daily before a meal.          . LISINOPRIL 20 MG PO TABS   Oral   Take 20 mg by mouth daily.           Marland Kitchen LOVASTATIN 40 MG PO TABS   Oral   Take 40 mg by mouth at bedtime.           Marland Kitchen MAGNESIUM 250 MG PO TABS   Oral   Take by mouth at bedtime.           Marland Kitchen METOPROLOL TARTRATE 50 MG PO TABS   Oral   Take 50 mg by mouth  2 (two) times daily.           . MULTIVITAMINS PO CAPS   Oral   Take 1 capsule by mouth daily.           Marland Kitchen NIACIN 500 MG PO TABS   Oral   Take 500 mg by mouth 2 (two) times daily with a meal.             Triage Vitals: BP 130/57  Pulse 76  Temp 97.9 F (36.6 C)  Resp 20  Ht 6\' 2"  (1.88 m)  Wt 244 lb (110.678 kg)  BMI 31.33 kg/m2  SpO2 99%  Physical Exam  Nursing note and vitals reviewed. Constitutional: He is oriented to person, place, and time. He appears well-developed and well-nourished.       Overweight.  HENT:  Head: Normocephalic and atraumatic.  Eyes: Conjunctivae normal and EOM are normal. Pupils are equal, round, and reactive to light.  Neck: Normal range of motion. Neck supple.  Cardiovascular: Normal rate, regular rhythm and normal heart sounds.   Pulmonary/Chest: Effort normal and breath sounds normal.  Abdominal: Soft. Bowel sounds are normal. He exhibits distension. There is tenderness in the suprapubic area.  Musculoskeletal: Normal  range of motion.  Neurological: He is alert and oriented to person, place, and time.  Skin: Skin is warm and dry.  Psychiatric: He has a normal mood and affect.    ED Course  Procedures (including critical care time) DIAGNOSTIC STUDIES: Oxygen Saturation is 99% on room air, normal by my interpretation.    COORDINATION OF CARE: 2:46 PM- Patient informed of current plan for treatment and evaluation and agrees with plan at this time. Foley catheter inserted by nursing staff. Good urinary outflow. Will order urinalysis.      Labs Reviewed  URINALYSIS, MICROSCOPIC ONLY - Abnormal; Notable for the following:    Bacteria, UA FEW (*)     Casts HYALINE CASTS (*)     All other components within normal limits  URINE CULTURE   No results found.   No diagnosis found.    MDM  Suspect urinary retention secondary to BPH.   Good urinary outflow with Foley catheter. Will see urologist tomorrow morning.      I personally performed the services described in this documentation, which was scribed in my presence. The recorded information has been reviewed and is accurate.    Donnetta Hutching, MD 10/07/12 [phone]

## 2012-10-07 NOTE — ED Notes (Signed)
Appt made with [facility] with Dr. Annabell Howells for 0845 on Friday 10-08-12 in [facility].  Todays Chart faxed to Office. [phone] as requested.

## 2012-10-07 NOTE — Discharge Instructions (Signed)
Acute Urinary Retention, Male Acute urinary retention is when you are unable to pee (urinate). This is a common in older males. Prostates can get bigger, which blocks the flow of pee.  HOME CARE  Only take medicine as told by your doctor.  If you are sent home with a tube that drains the bladder (catheter), there will be a drainage bag attached to it.  Keep the drainage bag empty.  Keep the drainage bag lower than your catheter.  Drink enough fluids to keep your pee clear or pale yellow.  Keep all follow-up appointments. GET HELP RIGHT AWAY IF:   You have chills.  You develop a fever.  You do not feel well. MAKE SURE YOU:   Understand these instructions.  Will watch your condition.  Will get help right away if you are not doing well or get worse. Document Released: 02/25/2008 Document Revised: 12/01/2011 Document Reviewed: 02/25/2008 [facility] Patient Information 2013 [place], [place].  See Dr. Annabell Howells tomorrow morning at 845 in [place].  Keep urinary catheter in until that time

## 2012-10-07 NOTE — ED Notes (Signed)
Pt was admitted to [facility] hospital Tuesday, pt states that the hospital told him that "it was his heart" but he is not sure, pt was being given lasix while in hospital, did not agree with treatment being given, was given lasix iv this am and pt feels that he was given too much and has not been able to urinate since being given the lasix iv, last time pt urinated was "sometime" this am, pt signed out AMA from [facility] about a hour ago,

## 2012-10-08 ENCOUNTER — Ambulatory Visit (INDEPENDENT_AMBULATORY_CARE_PROVIDER_SITE_OTHER): Payer: Medicare Other | Admitting: Urology

## 2012-10-08 DIAGNOSIS — N401 Enlarged prostate with lower urinary tract symptoms: Secondary | ICD-10-CM

## 2012-10-08 DIAGNOSIS — R338 Other retention of urine: Secondary | ICD-10-CM

## 2012-10-09 LAB — URINE CULTURE
Colony Count: NO GROWTH
Culture: NO GROWTH

## 2012-10-12 ENCOUNTER — Emergency Department (HOSPITAL_COMMUNITY): Payer: Medicare Other

## 2012-10-12 ENCOUNTER — Telehealth: Payer: Self-pay | Admitting: Gastroenterology

## 2012-10-12 ENCOUNTER — Encounter (HOSPITAL_COMMUNITY): Payer: Self-pay | Admitting: Emergency Medicine

## 2012-10-12 ENCOUNTER — Emergency Department (HOSPITAL_COMMUNITY)
Admission: EM | Admit: 2012-10-12 | Discharge: 2012-10-12 | Disposition: A | Discharge status: Home / Self Care | Payer: Medicare Other | Attending: Emergency Medicine | Admitting: Emergency Medicine

## 2012-10-12 DIAGNOSIS — Z794 Long term (current) use of insulin: Secondary | ICD-10-CM | POA: Insufficient documentation

## 2012-10-12 DIAGNOSIS — R142 Eructation: Secondary | ICD-10-CM | POA: Insufficient documentation

## 2012-10-12 DIAGNOSIS — Z87448 Personal history of other diseases of urinary system: Secondary | ICD-10-CM | POA: Insufficient documentation

## 2012-10-12 DIAGNOSIS — R109 Unspecified abdominal pain: Secondary | ICD-10-CM

## 2012-10-12 DIAGNOSIS — R141 Gas pain: Secondary | ICD-10-CM | POA: Insufficient documentation

## 2012-10-12 DIAGNOSIS — Z79899 Other long term (current) drug therapy: Secondary | ICD-10-CM | POA: Insufficient documentation

## 2012-10-12 DIAGNOSIS — R140 Abdominal distension (gaseous): Secondary | ICD-10-CM

## 2012-10-12 DIAGNOSIS — E119 Type 2 diabetes mellitus without complications: Secondary | ICD-10-CM | POA: Insufficient documentation

## 2012-10-12 DIAGNOSIS — Z7982 Long term (current) use of aspirin: Secondary | ICD-10-CM | POA: Insufficient documentation

## 2012-10-12 DIAGNOSIS — Z87891 Personal history of nicotine dependence: Secondary | ICD-10-CM | POA: Insufficient documentation

## 2012-10-12 DIAGNOSIS — Z862 Personal history of diseases of the blood and blood-forming organs and certain disorders involving the immune mechanism: Secondary | ICD-10-CM | POA: Insufficient documentation

## 2012-10-12 DIAGNOSIS — Z8673 Personal history of transient ischemic attack (TIA), and cerebral infarction without residual deficits: Secondary | ICD-10-CM | POA: Insufficient documentation

## 2012-10-12 DIAGNOSIS — E785 Hyperlipidemia, unspecified: Secondary | ICD-10-CM | POA: Insufficient documentation

## 2012-10-12 DIAGNOSIS — Z8679 Personal history of other diseases of the circulatory system: Secondary | ICD-10-CM | POA: Insufficient documentation

## 2012-10-12 LAB — CBC WITH DIFFERENTIAL/PLATELET
Basophils Absolute: 0 10*3/uL (ref 0.0–0.1)
Eosinophils Relative: 4 % (ref 0–5)
Lymphocytes Relative: 23 % (ref 12–46)
MCV: 98.5 fL (ref 78.0–100.0)
Neutrophils Relative %: 63 % (ref 43–77)
Platelets: 206 10*3/uL (ref 150–400)
RDW: 12.7 % (ref 11.5–15.5)
WBC: 6.7 10*3/uL (ref 4.0–10.5)

## 2012-10-12 LAB — BASIC METABOLIC PANEL
CO2: 30 mEq/L (ref 19–32)
Calcium: 9 mg/dL (ref 8.4–10.5)
GFR calc non Af Amer: 33 mL/min — ABNORMAL LOW (ref >90)
Sodium: 136 mEq/L (ref 135–145)

## 2012-10-12 LAB — URINE MICROSCOPIC-ADD ON

## 2012-10-12 LAB — URINALYSIS, ROUTINE W REFLEX MICROSCOPIC
Leukocytes, UA: NEGATIVE
Protein, ur: 30 mg/dL — AB
Urobilinogen, UA: 0.2 mg/dL (ref 0.0–1.0)

## 2012-10-12 NOTE — ED Notes (Signed)
MD at bedside. 

## 2012-10-12 NOTE — ED Provider Notes (Signed)
History     CSN: [id number]  Arrival date & time 10/12/12  4098   None     Chief Complaint  Patient presents with  . Abdominal Pain    (Consider location/radiation/quality/duration/timing/severity/associated sxs/prior treatment) HPI Comments: This patient presents here with complaints of "I have so many problems I can't count".  "I can't lay down and I can't stand up."  He tells me he is anxious and cannot stay still.  He can't really say for sure if he is in pain or having any specific symptoms.  This patient is a poor historian, I understand related to prior cva.  He is unable to explain to me why he is here in terms that I can completely understand.  The main symptom appears to be abdominal discomfort and distension.  From what I can see in the chart, he was admitted at [facility] a few weeks back, then signed out AMA and came here.  He was found to have urinary retention and a foley catheter was placed.  He is a diabetic and also has a history of CAD.    The history is provided by the patient.    Past Medical History  Diagnosis Date  . 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 Acute renal insufficiency     resolved  . Diabetes mellitus   . Macrocytosis     normal B12 & folate levels  . Ischemic cardiomyopathy     EF of 45-50% - posterior & inferior walls are severly hypokinetic  . Atrial fibrillation     not candidate for chronic anticoagulation with Coumadin given chrronic epistaxis as well as hemorrhagic conversion of his acute cva   . Hyperlipidemia     Past Surgical History  Procedure Date  . Cholecystectomy   . Coronary artery bypass graft   . Tonsillectomy     History reviewed. No pertinent family history.  History  Substance Use Topics  . Smoking status: Former Games developer  . Smokeless tobacco: Not on file  . Alcohol Use: No      Review of Systems  All other systems reviewed and are  negative.    Allergies  Ciprofloxacin and Xanax  Home Medications   Current Outpatient Rx  Name  Route  Sig  Dispense  Refill  . ASPIRIN EC 81 MG PO TBEC   Oral   Take 81 mg by mouth daily.         Marland Kitchen VITAMIN D-3 5000 UNITS PO TABS   Oral   Take 5,000 units of lipase by mouth daily.         Marland Kitchen FINASTERIDE 5 MG PO TABS   Oral   Take 5 mg by mouth daily.         . FUROSEMIDE 40 MG PO TABS   Oral   Take 40 mg by mouth 2 (two) times daily.         . INSULIN ISOPHANE HUMAN 100 UNIT/ML [place] SUSP   Subcutaneous   Inject 10-26 Units into the skin 2 (two) times daily. 26 Units in the morning & 10 Units every evening         . INSULIN REGULAR HUMAN 100 UNIT/ML IJ SOLN   Subcutaneous   Inject 10 Units into the skin 2 (two) times daily. 10units in the morning and 10 units in the evening.         Marland Kitchen LOVASTATIN 40 MG PO TABS  Oral   Take 40 mg by mouth at bedtime.           Marland Kitchen MAGNESIUM OXIDE 400 MG PO TABS   Oral   Take 400 mg by mouth daily.         Marland Kitchen METOCLOPRAMIDE HCL 5 MG PO TABS   Oral   Take 5 mg by mouth 2 (two) times daily as needed. Stomach Pain         . METOPROLOL TARTRATE 50 MG PO TABS   Oral   Take 50 mg by mouth 2 (two) times daily.           Marland Kitchen PREDNISOLONE ACETATE 1 % OP SUSP   Both Eyes   Place 1 drop into both eyes daily as needed. Eye Irritation           BP 174/64  Pulse 77  Temp 97.8 F (36.6 C) (Oral)  Resp 20  Ht 6\' 2"  (1.88 m)  Wt 245 lb (111.131 kg)  BMI 31.46 kg/m2  SpO2 97%  Physical Exam  Nursing note and vitals reviewed. Constitutional: He is oriented to person, place, and time. He appears well-developed and well-nourished. No distress.  HENT:  Head: Normocephalic and atraumatic.  Mouth/Throat: Oropharynx is clear and moist.  Neck: Normal range of motion. Neck supple.  Cardiovascular: Normal rate and regular rhythm.   No murmur heard. Pulmonary/Chest: Effort normal and breath sounds normal.  Abdominal: Soft.  Bowel sounds are normal.  Musculoskeletal: Normal range of motion. He exhibits no edema.  Lymphadenopathy:    He has no cervical adenopathy.  Neurological: He is alert and oriented to person, place, and time. No cranial nerve deficit. He exhibits normal muscle tone. Coordination normal.  Skin: Skin is warm and dry. He is not diaphoretic.    ED Course  Procedures (including critical care time)   Labs Reviewed  CBC WITH DIFFERENTIAL  BASIC METABOLIC PANEL  URINALYSIS, ROUTINE W REFLEX MICROSCOPIC   No results found.   No diagnosis found.   Date: 10/12/2012  Rate: 72  Rhythm: normal sinus rhythm  QRS Axis: normal  Intervals: normal  ST/T Wave abnormalities: nonspecific T wave changes  Conduction Disutrbances:none  Narrative Interpretation:   Old EKG Reviewed: unchanged    MDM  The patient presents here for complaints of abdominal distension and discomfort.  The workup reveals unremarkable labs and ct and urine that is unremarkable as well.  He is concerned about his stomach and would like to have referral made to GI.  I will provide him with the contact information for the Gastroenterologist on call today for him to arrange follow up.  He is a diabetic on insulin and these symptoms could be indicative of gastroparesis as I have found no other explanation.  He may benefit from an egd/colonoscopy as he has never had either.        Geoffery Lyons, MD 10/12/12 [phone]

## 2012-10-12 NOTE — Telephone Encounter (Signed)
Would be happy to see patient this Thursday at 11:30 am. It does not appear that his symptoms are urgent, but we can definitely work him in on Thursday.  If he is unable to do this, could offer the following week.

## 2012-10-12 NOTE — ED Notes (Signed)
Patient states that he was very uncomfortable last night, states that he felt as if he was having anxiety symptoms last night, states that he felt like he was short of breath, having abdominal pain, pacing the floor, having "hot flashes" and abdominal bloating.  Reports last BM was yesterday, however states that his bowel habits have changed and he has started becoming constipated over the last several months.  Wife states that the patient will go 5 and 6 days without having a bowel movement.

## 2012-10-12 NOTE — ED Notes (Signed)
Patient complaining of abdominal distention and indigestion starting last night. Patient states "I can't sleep or lay down. I keep burping."

## 2012-10-13 NOTE — Telephone Encounter (Signed)
Pt's wife is aware of OV on 1/23 at 1130 with AS

## 2012-10-14 ENCOUNTER — Encounter: Payer: Self-pay | Admitting: Gastroenterology

## 2012-10-14 ENCOUNTER — Ambulatory Visit (INDEPENDENT_AMBULATORY_CARE_PROVIDER_SITE_OTHER): Payer: Medicare Other | Admitting: Gastroenterology

## 2012-10-14 VITALS — BP 141/62 | HR 76 | Temp 97.6°F | Ht 74.0 in | Wt 249.6 lb

## 2012-10-14 DIAGNOSIS — R109 Unspecified abdominal pain: Secondary | ICD-10-CM

## 2012-10-14 DIAGNOSIS — K5900 Constipation, unspecified: Secondary | ICD-10-CM

## 2012-10-14 DIAGNOSIS — K219 Gastro-esophageal reflux disease without esophagitis: Secondary | ICD-10-CM

## 2012-10-14 MED ORDER — PEG 3350-KCL-NA BICARB-NACL 420 G PO SOLR: 4000.0000 mL | ORAL | Status: DC

## 2012-10-14 MED ORDER — OMEPRAZOLE 20 MG PO CPDR: 20.0000 mg | DELAYED_RELEASE_CAPSULE | Freq: Every day | ORAL | Status: DC

## 2012-10-14 MED ORDER — POLYETHYLENE GLYCOL 3350 17 GM/SCOOP PO POWD: 17.0000 g | Freq: Every day | ORAL | Status: DC

## 2012-10-14 NOTE — Progress Notes (Signed)
Primary Care Physician:  BLUTH, KIRK, MD Primary Gastroenterologist:  Dr. Rourk   Chief Complaint  Patient presents with  . Abdominal Pain    HPI:   Chris [name] is a 72-year-old male who presents today as a referral from the ED secondary to abdominal pain. He presented twice to the ED with complaints of abdominal pain and distension. CT noted small hiatal hernia, stable prominence of prostate gland, chronic L1 superior endplate compression fracture, no etiology for abdominal pain.  He is a poor historian, and his Chris [name] is present with him. He notes right-sided abdominal discomfort, decreased appetite, +nausea. Notes intermittent indigestion, occasional pill and solid food dysphagia. Notes intermittent gas/burping. +constipation. He has been avoiding fried foods, taking OTC antacids and pepto. Notes scant hematochezia with straining. +wt gain, no wt loss. No prior TCS or EGD.   Lab Results  Component Value Date   WBC 6.7 10/12/2012   HGB 13.2 10/12/2012   HCT 38.6* 10/12/2012   MCV 98.5 10/12/2012   PLT 206 10/12/2012     Past Medical History  Diagnosis Date  . 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 Acute renal insufficiency     resolved  . Diabetes mellitus   . Macrocytosis     normal B12 & folate levels  . Ischemic cardiomyopathy     EF of 45-50% - posterior & inferior walls are severly hypokinetic  . Atrial fibrillation     not candidate for chronic anticoagulation with Coumadin given chrronic epistaxis as well as hemorrhagic conversion of his acute cva   . Hyperlipidemia   . Hypertension     Past Surgical History  Procedure Date  . Cholecystectomy   . Coronary artery bypass graft   . Tonsillectomy     Current Outpatient Prescriptions  Medication Sig Dispense Refill  . aspirin EC 81 MG tablet Take 81 mg by mouth daily.      . Cholecalciferol (VITAMIN D-3) 5000 UNITS TABS Take 5,000 Units by mouth  daily.       . finasteride (PROSCAR) 5 MG tablet Take 5 mg by mouth daily.      . furosemide (LASIX) 40 MG tablet Take 40 mg by mouth 2 (two) times daily.      . insulin NPH (HUMULIN N,NOVOLIN N) 100 UNIT/ML injection Inject 10-26 Units into the skin 2 (two) times daily. 26 Units in the morning & 10 Units every evening      . insulin regular (HUMULIN R,NOVOLIN R) 100 units/mL injection Inject 10 Units into the skin 2 (two) times daily. 10units in the morning and 10 units in the evening.      . lovastatin (MEVACOR) 40 MG tablet Take 40 mg by mouth at bedtime.        . magnesium oxide (MAG-OX) 400 MG tablet Take 400 mg by mouth daily.      . metoprolol (LOPRESSOR) 50 MG tablet Take 50 mg by mouth 2 (two) times daily.        . metoCLOPramide (REGLAN) 5 MG tablet Take 5 mg by mouth 2 (two) times daily as needed. Stomach Pain      . omeprazole (PRILOSEC) 20 MG capsule Take 1 capsule (20 mg total) by mouth daily. 30 minutes before breakfast.  30 capsule  3  . polyethylene glycol powder (GLYCOLAX/MIRALAX) powder Take 17 g by mouth daily as needed. For constipation.      . polyethylene glycol-electrolytes (TRILYTE) 420 G   solution Take 4,000 mLs by mouth as directed.  4000 mL  0  . prednisoLONE acetate (PRED FORTE) 1 % ophthalmic suspension Place 1 drop into both eyes daily as needed. Eye Irritation        Allergies as of 10/14/2012 - Review Complete 10/14/2012  Allergen Reaction Noted  . Ciprofloxacin Other (See Comments) 10/07/2012  . Xanax (alprazolam) Other (See Comments) 10/07/2012    Family History  Problem Relation Age of Onset  . Colon cancer Neg Hx     History   Social History  . Marital Status: Single    Spouse Name: N/A    Number of Children: N/A  . Years of Education: N/A   Occupational History  . Not on file.   Social History Main Topics  . Smoking status: Former Smoker  . Smokeless tobacco: Not on file  . Alcohol Use: No  . Drug Use: No  . Sexually Active: Not on file    Other Topics Concern  . Not on file   Social History Narrative  . No narrative on file    Review of Systems: Gen: SEE HPI CV: +palpitations Resp: Denies shortness of breath at rest or with exertion. Denies wheezing or cough.  GI: SEE HPI GU : Denies urinary burning, urinary frequency, urinary hesitancy MS: +back pain Derm: Denies rash, itching, dry skin Psych: + depression Heme: Denies bruising, bleeding, and enlarged lymph nodes.  Physical Exam: BP 141/62  Pulse 76  Temp 97.6 F (36.4 C) (Oral)  Ht 6' 2" (1.88 m)  Wt 249 lb 9.6 oz (113.218 kg)  BMI 32.05 kg/m2 General:   Alert and oriented. Pleasant and cooperative. Well-nourished and well-developed.  Head:  Normocephalic and atraumatic. Eyes:  Without icterus, sclera clear and conjunctiva pink.  Ears:  Normal auditory acuity. Nose:  No deformity, discharge,  or lesions. Mouth:  No deformity or lesions, oral mucosa pink.  Neck:  Supple, without mass or thyromegaly. Lungs:  Clear to auscultation bilaterally. No wheezes, rales, or rhonchi. No distress.  Heart:  S1, S2 present without murmurs appreciated.  Abdomen:  +BS, soft, non-tender and non-distended. No HSM noted. No guarding or rebound. No masses appreciated.  Rectal:  Deferred  Msk:  Symmetrical without gross deformities. Normal posture. Extremities:  Without clubbing or edema. Neurologic:  Alert and  oriented x4;  grossly normal neurologically. Skin:  Intact without significant lesions or rashes. Cervical Nodes:  No significant cervical adenopathy. Psych:  Alert and cooperative. Normal mood and affect.    

## 2012-10-14 NOTE — Patient Instructions (Addendum)
Stop taking Reglan and Pepto Bismol.  Start taking Prilosec daily, 30 minutes before breakfast. This is for reflux, indigestion. Start taking Miralax every day to help with more productive and softer bowel movements. Stop if you have loose stools.  We have set you up for a colonoscopy and upper endoscopy with Dr. Jena Gauss in the near future.   You will need to take only a 1/2 dose of your insulin the day before the procedure. Do not take any insulin the morning of the procedure.   How to Increase Fiber in the Meal Plan for Diabetes Increasing fiber in the diet has many benefits including lowering blood cholesterol, helping to control blood glucose (sugar), preventing constipation, and aiding in weight management by helping you feel full longer. Start adding fiber to your diet slowly. A gradual substitution of high fiber foods for low fiber foods will allow the digestive tract to adjust. Most men under 10 years of age should aim to eat 38 g of fiber a day. Women should aim for 25 g. Over 26 years of age, most men need 30 g of fiber and most women need 21 g. Below are some suggestions for increasing fiber.  Try whole-wheat bread instead of white bread. Look for words high on the list of ingredients such as whole wheat, whole rye, or whole oats.   Try baked potato with skin instead of mashed potatoes.   Try a fresh apple with skin instead of applesauce.   Try a fresh orange instead of orange juice.   Try popcorn instead of potato chips.   Try bran cereal instead of corn flakes.   Try kidney, whole pinto, or garbanzo beans instead of bread.   Try whole-grain crackers instead of saltine crackers.   Try whole-wheat pasta instead of regular varieties.   While on a high fiber diet, drink enough water and fluids to keep your urine clear or pale yellow.   Eat a variety of high fiber foods such as fruits, vegetables, whole grains, nuts, and seeds.   Try to increase your intake of fiber by eating  high fiber foods instead of taking fiber pills or supplements that contain small amounts of fiber. There can be additional benefits for long-term health and blood glucose control with high fiber foods. Aim for 5 servings of fruit and vegetables per day.  SOURCES OF FIBER The following list shows the average dietary fiber for types of food in the various food groups. Starch and Bread / Dietary Fiber (g)  Whole-grain breads, 1 slice / 2 g   Whole grain,  cup / 2 g   Whole-grain cereals,  cup / 3 g   Bran cereals,  to  cup / 8 g   Starchy vegetables,  cup / 3 g   Legumes (beans, peas, lentils),  cup / 8 g   Oatmeal,  cup / 2 g   Whole-wheat pasta,  cup / 2 g   Brown rice,  cup / 2 g   Barley,  cup / 3 g  Meat and Meat Substitutes / Dietary Fiber (g) This group averages 0 grams of fiber. Exceptions are:  Nuts, seeds, 1 oz or  cup / 3 g   Chunky peanut butter, 2 tbs / 3 g  Vegetables / Dietary Fiber (g)  Cooked vegetables,  to  cup / 2 to 3 g   Raw vegetables, 1 to 2 cups / 2 to 3 g  Fruit / Dietary Fiber (g)  Raw or  cooked fruit,  cup or 1 small, fresh piece / 2 g  Milk / Dietary Fiber (g)  Milk, 1 cup or 8 oz / 0 g  Fats and Oils / Dietary Fiber (g)  Fats and oils, 1 tsp / 0 g  You can determine how much fiber you are eating by reading the Nutrition Facts panel on the labels of the foods you eat. FIBER IN SPECIFIC FOODS Cereals / Dietary Fiber (g)  All Bran,  cup / 9 g   All Bran with Extra Fiber,  cup / 13 g   Bran Flakes,  cup / 4 g   Cheerios,  cup / 1.5 g   Corn Bran,  cup / 4 g   Corn Flakes,  cup / 0.75 g   Cracklin' Oat Bran,  cup / 4 g   Fiber One,  cup / 13 g   Grape Nuts, 3 tbs / 3 g   Grape Nuts Flakes,  cup / 3 g   Noodles,  cup, cooked / 0.5 g   Nutrigrain Wheat,  cup / 3.5 g   Oatmeal,  cup, cooked / 1.1 g   Pasta, white (macaroni, spaghetti),  cup, cooked / 0.5 g   Pasta, whole-wheat (macaroni,  spaghetti),  cup, cooked / 2 g   [place],  cup, cooked / 3 g   Rice, wild,  cup, cooked / 0.5 g   Rice, brown,  cup, cooked / 1 g   Rice, white,  cup, cooked / 0.2 g   Shredded Wheat, bite-sized,  cup / 2 g   Total,  cup / 1.75 g   Wheat Chex,  cup / 2.5 g   Wheatena,  cup, cooked / 4 g   Wheaties,  cup / 2.75 g  Bread, Starchy Vegetables, and Dried Peas and Beans / Dietary Fiber (g)  Bagel, whole / 0.6 g   Baked beans in tomato sauce,  cup, cooked / 3 g   Bran muffin, 1 small / 2.5 g   Bread, cracked wheat, 1 slice / 2.5 g   Bread, pumpernickel, 1 slice / 2.5 g   Bread, white, 1 slice / 0.4 g   Bread, whole-wheat, 1 slice / 1.4 g   Corn,  cup, canned / 2.9 g   Kidney beans,  cup, cooked / 3.5 g   Lentils, cup, cooked / 3 g   Lima beans,  cup, cooked / 4 g   Navy beans,  cup, cooked / 4 g   Peas,  cup, cooked / 4 g   Popcorn, 3 cups popped, unbuttered / 3.5 g   Potato, baked (with skin), 1 small / 4 g   Potato, baked (without skin), 1 small / 2 g   Ry-Krisp, 4 crackers / 3 g   Saltine crackers, 6 squares / 0 g   Split peas,  cup, cooked / 2.5 g   Yams (sweet potato),  cup / 1.7 g  Fruit / Dietary Fiber (g)  Apple, 1 small, fresh, with skin / 4 g   Apple juice,  cup / 0.4 g   Apricots, 4 medium, fresh / 4 g   Apricots, 7 halves, dried / 2 g   Banana,  medium / 1.2 g   Blueberries,  cup / 2 g   Cantaloupe, melon / 1.3 g   Cherries,  cup, canned / 1.4 g   Grapefruit,  medium / 1.6 g   Grapes, 15 small / 1.2 g  Grape juice,  cup / 0.5 g   Orange, 1 medium, fresh / 2 g   Orange juice,  cup / 0.5 g   Peach, 1 medium,fresh, with skin / 2 g   Pear, 1 medium, fresh, with skin / 4 g   Pineapple, cup, canned / 0.7 g   Plums, 2 whole / 2 g   Prunes, 3 whole / 1.5 g   Raspberries, 1 cup / 6 g   Strawberries, 1  cup / 4 g   Watermelon, 1  cup / 0.5 g  Vegetables / Dietary Fiber (g)  Asparagus,   cup, cooked / 1 g   Beans, green and wax,  cup, cooked / 1.6 g   Beets,  cup, cooked / 1.8 g   Broccoli,  cup, cooked / 2.2 g   Brussels sprouts,  cup, cooked / 4 g   Cabbage,  cup, cooked / 2.5 g   Carrots,  cup, cooked / 2.3 g   Cauliflower,  cup, cooked / 1.1 g   Celery, 1 cup, raw / 1.5 g   Cucumber, 1 cup, raw / 0.8 g   Green pepper,  cup sliced, cooked / 1.5 g   Lettuce, 1 cup, sliced / 0.9 g   Mushrooms, 1 cup sliced, raw / 1.8 g   Onion, 1 cup sliced, raw / 1.6 g   Spinach,  cup, cooked / 2.4 g   Tomato, 1 medium, fresh / 1.5 g   Tomato juice,  cup / 0 g   Zucchini,  cup, cooked / 1.8 g  Document Released: 02/28/2002 Document Revised: 12/01/2011 Document Reviewed: 03/27/2009 [facility] Patient Information 2013 [place], [place].

## 2012-10-15 ENCOUNTER — Encounter (HOSPITAL_COMMUNITY): Payer: Self-pay | Admitting: Pharmacy Technician

## 2012-10-18 ENCOUNTER — Encounter: Payer: Self-pay | Admitting: Gastroenterology

## 2012-10-18 ENCOUNTER — Telehealth: Payer: Self-pay | Admitting: *Deleted

## 2012-10-18 DIAGNOSIS — R109 Unspecified abdominal pain: Secondary | ICD-10-CM | POA: Insufficient documentation

## 2012-10-18 DIAGNOSIS — K5900 Constipation, unspecified: Secondary | ICD-10-CM | POA: Insufficient documentation

## 2012-10-18 DIAGNOSIS — K219 Gastro-esophageal reflux disease without esophagitis: Secondary | ICD-10-CM | POA: Insufficient documentation

## 2012-10-18 NOTE — Assessment & Plan Note (Addendum)
OTC antacids and pepto currently, no PPI. No wt loss noted, in fact he has gained weight. Intermittent pill and solid food dysphagia noted as well. +nausea. Question underlying occult gastritis, PUD, could have gastroparesis as etiology for nausea but wouldn't explain pain. Gallbladder no longer remains. CT without evidence for significant abnormalities. Needs EGD for further evaluation, with dilation as appropriate.    Proceed with upper endoscopy at time of colonoscopy in the near future with Dr. Jena Gauss. The risks, benefits, and alternatives have been discussed in detail with patient. They have stated understanding and desire to proceed.  Start Prilosec daily  ADDENDUM 2/3: Pt unable to tolerate Prilosec, states causes worsening abdominal pain. Switched to Protonix BID. Will hold off on colonoscopy at time of EGD, as patient feels he will not be able to tolerate the prep due to abdominal pain, reflux, regurgitation. As of note, CT on file, multiple visits to ED. Compliance a concern, see multiple phone notes.

## 2012-10-18 NOTE — Assessment & Plan Note (Addendum)
Miralax daily prn. TCS as planned.   Addendum 2/3: TCS in future, hold off on TCS at time of EGD due to pt's concerns about tolerating prep.

## 2012-10-18 NOTE — Telephone Encounter (Signed)
Ms Schildt called today regarding her son. He is having a lot of gas discomfort and would like to speak with someone about it. Please call her back. Thank you.

## 2012-10-18 NOTE — Assessment & Plan Note (Addendum)
72 year old male, somewhat poor historian, presenting with vague right-sided abdominal discomfort, decreased appetite, nausea, and constipation. Scant hematochezia with straining. No prior TCS or EGD. CT on file from ED visits with small hiatal hernia, stable prominence of prostate gland, chronic L1 superior endplate compression fracture, no etiology for abdominal pain. He has been taking pepto, which likely is worsening constipation. Abdominal pain may be secondary to chronic constipation, with an element of chronic abdominal pain. Status post cholecystectomy in past. Needs lower GI evaluation for further assessment.   Proceed with TCS with Dr. Jena Gauss in near future: the risks, benefits, and alternatives have been discussed with the patient in detail. The patient states understanding and desires to proceed. Miralax daily prn Stop Reglan See "GERD"   ADDENDUM 2/3: Hold off on TCS until after EGD. Will need to f/u in the office after EGD to discuss timing of this.

## 2012-10-18 NOTE — Telephone Encounter (Signed)
Abd pain and worst when he eats. He diarrhea. He can't eat because it hurts so much. He can not sleep because of the pain. He thinks it is a ulcer. Please advise what he can do.

## 2012-10-19 MED ORDER — PANTOPRAZOLE SODIUM 40 MG PO TBEC: 40.0000 mg | DELAYED_RELEASE_TABLET | Freq: Every day | ORAL | Status: DC

## 2012-10-19 NOTE — Addendum Note (Signed)
Addended by: Nira Retort on: 10/19/2012 11:20 AM   Modules accepted: Orders

## 2012-10-19 NOTE — Telephone Encounter (Signed)
He needs to be on a PPI. Stop Prilosec. I am sending in Protonix. Limit pepto bismol. He has been dealing with constipation.

## 2012-10-19 NOTE — Progress Notes (Signed)
Faxed to PCP

## 2012-10-19 NOTE — Telephone Encounter (Signed)
Pt does not take Prilosec because it burns his stomach and hid throat. He is taking Pepto-Bismol. He is feeling a little bit better today.

## 2012-10-19 NOTE — Telephone Encounter (Signed)
Increase Prilosec to BID. Hold Miralax if having loose stools.  Clear liquid diet, advance as tolerated. Avoid spicy foods.

## 2012-10-20 NOTE — Telephone Encounter (Signed)
Pt is aware of new Rx. Told him to limit the Pepto-bismol

## 2012-10-21 ENCOUNTER — Encounter (HOSPITAL_COMMUNITY): Payer: Self-pay

## 2012-10-21 ENCOUNTER — Emergency Department (HOSPITAL_COMMUNITY)
Admission: EM | Admit: 2012-10-21 | Discharge: 2012-10-21 | Disposition: A | Discharge status: Home / Self Care | Payer: Medicare Other | Attending: Emergency Medicine | Admitting: Emergency Medicine

## 2012-10-21 ENCOUNTER — Emergency Department (HOSPITAL_COMMUNITY): Payer: Medicare Other

## 2012-10-21 ENCOUNTER — Other Ambulatory Visit: Payer: Self-pay

## 2012-10-21 DIAGNOSIS — E785 Hyperlipidemia, unspecified: Secondary | ICD-10-CM | POA: Insufficient documentation

## 2012-10-21 DIAGNOSIS — E119 Type 2 diabetes mellitus without complications: Secondary | ICD-10-CM | POA: Insufficient documentation

## 2012-10-21 DIAGNOSIS — Z8679 Personal history of other diseases of the circulatory system: Secondary | ICD-10-CM | POA: Insufficient documentation

## 2012-10-21 DIAGNOSIS — R141 Gas pain: Secondary | ICD-10-CM | POA: Insufficient documentation

## 2012-10-21 DIAGNOSIS — G8929 Other chronic pain: Secondary | ICD-10-CM | POA: Insufficient documentation

## 2012-10-21 DIAGNOSIS — R1033 Periumbilical pain: Secondary | ICD-10-CM | POA: Insufficient documentation

## 2012-10-21 DIAGNOSIS — R140 Abdominal distension (gaseous): Secondary | ICD-10-CM

## 2012-10-21 DIAGNOSIS — R142 Eructation: Secondary | ICD-10-CM | POA: Insufficient documentation

## 2012-10-21 DIAGNOSIS — Z794 Long term (current) use of insulin: Secondary | ICD-10-CM | POA: Insufficient documentation

## 2012-10-21 DIAGNOSIS — Z8673 Personal history of transient ischemic attack (TIA), and cerebral infarction without residual deficits: Secondary | ICD-10-CM | POA: Insufficient documentation

## 2012-10-21 DIAGNOSIS — Z87448 Personal history of other diseases of urinary system: Secondary | ICD-10-CM | POA: Insufficient documentation

## 2012-10-21 DIAGNOSIS — Z7982 Long term (current) use of aspirin: Secondary | ICD-10-CM | POA: Insufficient documentation

## 2012-10-21 DIAGNOSIS — Z862 Personal history of diseases of the blood and blood-forming organs and certain disorders involving the immune mechanism: Secondary | ICD-10-CM | POA: Insufficient documentation

## 2012-10-21 DIAGNOSIS — Z79899 Other long term (current) drug therapy: Secondary | ICD-10-CM | POA: Insufficient documentation

## 2012-10-21 DIAGNOSIS — I10 Essential (primary) hypertension: Secondary | ICD-10-CM | POA: Insufficient documentation

## 2012-10-21 DIAGNOSIS — Z87891 Personal history of nicotine dependence: Secondary | ICD-10-CM | POA: Insufficient documentation

## 2012-10-21 LAB — COMPREHENSIVE METABOLIC PANEL
ALT: 16 U/L (ref 0–53)
Albumin: 3.9 g/dL (ref 3.5–5.2)
Alkaline Phosphatase: 63 U/L (ref 39–117)
Chloride: 98 mEq/L (ref 96–112)
Glucose, Bld: 203 mg/dL — ABNORMAL HIGH (ref 70–99)
Potassium: 4.1 mEq/L (ref 3.5–5.1)
Sodium: 135 mEq/L (ref 135–145)
Total Bilirubin: 0.9 mg/dL (ref 0.3–1.2)
Total Protein: 7.5 g/dL (ref 6.0–8.3)

## 2012-10-21 LAB — CBC WITH DIFFERENTIAL/PLATELET
Basophils Relative: 0 % (ref 0–1)
Eosinophils Absolute: 0.1 10*3/uL (ref 0.0–0.7)
Hemoglobin: 12.8 g/dL — ABNORMAL LOW (ref 13.0–17.0)
Lymphs Abs: 1.4 10*3/uL (ref 0.7–4.0)
MCH: 34.4 pg — ABNORMAL HIGH (ref 26.0–34.0)
Neutro Abs: 3.8 10*3/uL (ref 1.7–7.7)
Neutrophils Relative %: 62 % (ref 43–77)
Platelets: 208 10*3/uL (ref 150–400)
RBC: 3.72 MIL/uL — ABNORMAL LOW (ref 4.22–5.81)

## 2012-10-21 LAB — URINALYSIS, ROUTINE W REFLEX MICROSCOPIC
Glucose, UA: NEGATIVE mg/dL
Specific Gravity, Urine: 1.02 (ref 1.005–1.030)

## 2012-10-21 LAB — URINE MICROSCOPIC-ADD ON

## 2012-10-21 MED ORDER — FAMOTIDINE 20 MG PO TABS
40.0000 mg | ORAL_TABLET | Freq: Once | ORAL | Status: AC
  Administered 2012-10-21: 40 mg via ORAL
  Filled 2012-10-21: qty 2

## 2012-10-21 MED ORDER — GI COCKTAIL ~~LOC~~
30.0000 mL | Freq: Once | ORAL | Status: AC
  Administered 2012-10-21: 30 mL via ORAL
  Filled 2012-10-21: qty 30

## 2012-10-21 NOTE — ED Provider Notes (Signed)
History     CSN: [id number]  Arrival date & time 10/21/12  4098   First MD Initiated Contact with Patient 10/21/12 1902      Chief Complaint  Patient presents with  . Abdominal Pain     HPI Pt was seen at 1930.   Per pt, c/o gradual onset and persistence of constant generalized abd "pain" for the past several months.  Describes the abd pain as "bloating" and "burning."  States he feels better "after I burp." Pt has been evaluated in the ED 1 week ago for same and referred to GI MD.  Pt has been eval by GI MD, rx prilosec and miralax, and is to be scheduled for colonoscopy on 10/27/12.  Pt states he continues to take pepto bismol despite being told not to by his GI MD.  Describes his stools as "black" after he took the pepto bismol. Denies any change in his symptoms over the past several months. Denies N/V/D, no fevers, no back pain, no rash, no CP/SOB, no blood in stools.  The symptoms have been associated with no other complaints. The patient has a significant history of similar symptoms previously, recently being evaluated for this complaint and multiple prior evals for same.     Past Medical History  Diagnosis Date  . 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 Acute renal insufficiency     resolved  . Diabetes mellitus   . Macrocytosis     normal B12 & folate levels  . Ischemic cardiomyopathy     EF of 45-50% - posterior & inferior walls are severly hypokinetic  . Atrial fibrillation     not candidate for chronic anticoagulation with Coumadin given chrronic epistaxis as well as hemorrhagic conversion of his acute cva   . Hyperlipidemia   . Hypertension     Past Surgical History  Procedure Date  . Cholecystectomy   . Coronary artery bypass graft   . Tonsillectomy     Family History  Problem Relation Age of Onset  . Colon cancer Neg Hx     History  Substance Use Topics  . Smoking status: Former  Games developer  . Smokeless tobacco: Not on file  . Alcohol Use: No    Review of Systems ROS: Statement: All systems negative except as marked or noted in the HPI; Constitutional: Negative for fever and chills. ; ; Eyes: Negative for eye pain, redness and discharge. ; ; ENMT: Negative for ear pain, hoarseness, nasal congestion, sinus pressure and sore throat. ; ; Cardiovascular: Negative for chest pain, palpitations, diaphoresis, dyspnea and peripheral edema. ; ; Respiratory: Negative for cough, wheezing and stridor. ; ; Gastrointestinal: +abd pain. Negative for nausea, vomiting, diarrhea, blood in stool, hematemesis, jaundice and rectal bleeding. . ; ; Genitourinary: Negative for dysuria, flank pain and hematuria. ; ; Musculoskeletal: Negative for back pain and neck pain. Negative for swelling and trauma.; ; Skin: Negative for pruritus, rash, abrasions, blisters, bruising and skin lesion.; ; Neuro: Negative for headache, lightheadedness and neck stiffness. Negative for weakness, altered level of consciousness , altered mental status, extremity weakness, paresthesias, involuntary movement, seizure and syncope.       Allergies  Ciprofloxacin and Xanax  Home Medications   Current Outpatient Rx  Name  Route  Sig  Dispense  Refill  . ASPIRIN EC 81 MG PO TBEC   Oral   Take 81 mg by mouth daily.         Marland Kitchen  VITAMIN D-3 5000 UNITS PO TABS   Oral   Take 5,000 Units by mouth daily.          Marland Kitchen FINASTERIDE 5 MG PO TABS   Oral   Take 5 mg by mouth daily.         . FUROSEMIDE 40 MG PO TABS   Oral   Take 40 mg by mouth 2 (two) times daily.         . INSULIN ISOPHANE HUMAN 100 UNIT/ML [place] SUSP   Subcutaneous   Inject 10-26 Units into the skin 2 (two) times daily. 26 Units in the morning & 10 Units every evening         . INSULIN REGULAR HUMAN 100 UNIT/ML IJ SOLN   Subcutaneous   Inject 10 Units into the skin 2 (two) times daily. 10units in the morning and 10 units in the evening.          Marland Kitchen LOVASTATIN 40 MG PO TABS   Oral   Take 40 mg by mouth at bedtime.           Marland Kitchen MAGNESIUM OXIDE 400 MG PO TABS   Oral   Take 400 mg by mouth daily.         Marland Kitchen METOPROLOL TARTRATE 50 MG PO TABS   Oral   Take 50 mg by mouth 2 (two) times daily.           Marland Kitchen OMEPRAZOLE 20 MG PO CPDR   Oral   Take 1 capsule (20 mg total) by mouth daily. 30 minutes before breakfast.   30 capsule   3   . POLYETHYLENE GLYCOL 3350 PO POWD   Oral   Take 17 g by mouth daily as needed. For constipation.         . PEG 3350-KCL-NA BICARB-NACL 420 G PO SOLR   Oral   Take 4,000 mLs by mouth as directed.   4000 mL   0   . PREDNISOLONE ACETATE 1 % OP SUSP   Both Eyes   Place 1 drop into both eyes daily as needed. Eye Irritation           BP 155/65  Pulse 76  Temp 97.8 F (36.6 C) (Oral)  Wt 253 lb (114.76 kg)  SpO2 98%  Physical Exam 1935: Physical examination:  Nursing notes reviewed; Vital signs and O2 SAT reviewed;  Constitutional: Well developed, Well nourished, Well hydrated, In no acute distress; Head:  Normocephalic, atraumatic; Eyes: EOMI, PERRL, No scleral icterus; ENMT: Mouth and pharynx normal, Mucous membranes moist; Neck: Supple, Full range of motion, No lymphadenopathy; Cardiovascular: Regular rate and rhythm, No murmur, rub, or gallop; Respiratory: Breath sounds clear & equal bilaterally, No rales, rhonchi, wheezes.  Speaking full sentences with ease, Normal respiratory effort/excursion; Chest: Nontender, Movement normal; Abdomen: Soft, Nontender, Nondistended, Normal bowel sounds. Rectal exam performed w/permission of pt and ED RN chaparone present.  Anal tone normal.  Non-tender, soft brown stool in rectal vault, heme neg.  No fissures, no external hemorrhoids, no palp masses.;; Genitourinary: No CVA tenderness; Extremities: Pulses normal, No tenderness, No edema, No calf edema or asymmetry.; Neuro: AA&Ox3, vague/rambling historian. Major CN grossly intact.  Speech clear. No gross  focal motor or sensory deficits in extremities.; Skin: Color normal, Warm, Dry.    ED Course  Procedures    MDM  MDM Reviewed: nursing note, previous chart and vitals Reviewed previous: labs, CT scan and ECG Interpretation: labs, ECG and x-ray    Date:  10/21/2012  Rate: 63  Rhythm: normal sinus rhythm and premature ventricular contractions (PVC)  QRS Axis: normal  Intervals: normal  ST/T Wave abnormalities: nonspecific ST/T changes  Conduction Disutrbances:nonspecific intraventricular conduction delay  Narrative Interpretation:   Old EKG Reviewed: unchanged; no significant changes from previous EKG dated 10/12/2012.     Results for orders placed during the hospital encounter of 10/21/12  CBC WITH DIFFERENTIAL      Component Value Range   WBC 6.1  4.0 - 10.5 K/uL   RBC 3.72 (*) 4.22 - 5.81 MIL/uL   Hemoglobin 12.8 (*) 13.0 - 17.0 g/dL   HCT 95.6 (*) 21.3 - 08.6 %   MCV 98.7  78.0 - 100.0 fL   MCH 34.4 (*) 26.0 - 34.0 pg   MCHC 34.9  30.0 - 36.0 g/dL   RDW 57.8  46.9 - 62.9 %   Platelets 208  150 - 400 K/uL   Neutrophils Relative 62  43 - 77 %   Neutro Abs 3.8  1.7 - 7.7 K/uL   Lymphocytes Relative 23  12 - 46 %   Lymphs Abs 1.4  0.7 - 4.0 K/uL   Monocytes Relative 13 (*) 3 - 12 %   Monocytes Absolute 0.8  0.1 - 1.0 K/uL   Eosinophils Relative 2  0 - 5 %   Eosinophils Absolute 0.1  0.0 - 0.7 K/uL   Basophils Relative 0  0 - 1 %   Basophils Absolute 0.0  0.0 - 0.1 K/uL  COMPREHENSIVE METABOLIC PANEL      Component Value Range   Sodium 135  135 - 145 mEq/L   Potassium 4.1  3.5 - 5.1 mEq/L   Chloride 98  96 - 112 mEq/L   CO2 28  19 - 32 mEq/L   Glucose, Bld 203 (*) 70 - 99 mg/dL   BUN 34 (*) 6 - 23 mg/dL   Creatinine, Ser 5.28 (*) 0.50 - 1.35 mg/dL   Calcium 9.1  8.4 - 41.3 mg/dL   Total Protein 7.5  6.0 - 8.3 g/dL   Albumin 3.9  3.5 - 5.2 g/dL   AST 23  0 - 37 U/L   ALT 16  0 - 53 U/L   Alkaline Phosphatase 63  39 - 117 U/L   Total Bilirubin 0.9  0.3 - 1.2  mg/dL   GFR calc non Af Amer 34 (*) >90 mL/min   GFR calc Af Amer 39 (*) >90 mL/min  LIPASE, BLOOD      Component Value Range   Lipase 15  11 - 59 U/L  TROPONIN I      Component Value Range   Troponin I <0.30  <0.30 ng/mL  URINALYSIS, ROUTINE W REFLEX MICROSCOPIC      Component Value Range   Color, Urine YELLOW  YELLOW   APPearance CLEAR  CLEAR   Specific Gravity, Urine 1.020  1.005 - 1.030   pH 5.5  5.0 - 8.0   Glucose, UA NEGATIVE  NEGATIVE mg/dL   Hgb urine dipstick TRACE (*) NEGATIVE   Bilirubin Urine NEGATIVE  NEGATIVE   Ketones, ur NEGATIVE  NEGATIVE mg/dL   Protein, ur 30 (*) NEGATIVE mg/dL   Urobilinogen, UA 0.2  0.0 - 1.0 mg/dL   Nitrite NEGATIVE  NEGATIVE   Leukocytes, UA NEGATIVE  NEGATIVE  URINE MICROSCOPIC-ADD ON      Component Value Range   WBC, UA 0-2  <3 WBC/hpf   RBC / HPF 0-2  <3 RBC/hpf  Bacteria, UA RARE  RARE   Ct Abdomen Pelvis Wo Contrast 10/12/2012  *RADIOLOGY REPORT*  Clinical Data: Abdominal distention.  Nausea.  Difficulty feeding. Abdominal pain.  Chronic renal insufficiency.  Diabetes.  CT ABDOMEN AND PELVIS WITHOUT CONTRAST  Technique:  Multidetector CT imaging of the abdomen and pelvis was performed following the standard protocol without intravenous contrast.  Comparison: Multiple exams, including 09/03/2012  Findings: Hiatal hernia observed.  Stable pancreatic atrophy. Noncontrast CT appearance of the liver, spleen, and adrenal glands is unremarkable.  Prior cholecystectomy noted.  Aortoiliac atherosclerotic calcification is present.  Chronic bilateral perirenal stranding appears stable.  No gastric distention noted.  Duodenum unremarkable.  No dilated bowel observed.  The appendix normal.  Stable prominence of prostate gland.  The prostate gland indents the urinary bladder base.  No free pelvic fluid is observed.  No pathologic retroperitoneal or porta hepatis adenopathy is identified.  No pathologic pelvic adenopathy is identified.  No renal or  ureteral calculi observed.  Mild prominence of perirenal adipose tissue is likely incidental.  Chronic L1 superior endplate compression fracture noted without significant bony retropulsion.  No lumbar malalignment.  IMPRESSION:  1.  Small hiatal hernia. 2.  Atherosclerosis. 3.  Stable prominence prostate gland. 4.  Chronic L1 superior endplate compression fracture. 5.  A specific cause for the patient's nausea and abdominal pain is not observed.   Original Report Authenticated By: Gaylyn Rong, M.D.    Dg Abd Acute W/chest 10/21/2012  *RADIOLOGY REPORT*  Clinical Data: Abdominal pain, nausea, vomiting, evaluate small bowel obstruction  ACUTE ABDOMEN SERIES (ABDOMEN 2 VIEW & CHEST 1 VIEW)  Comparison: CT abdomen/pelvis 10/12/2012  Findings: Frontal view of the chest demonstrates clear lung fields. No edema, focal airspace consolidation, pleural effusion or pneumothorax.  Unchanged enlargement of the cardiopericardial silhouette.  Status post median sternotomy with evidence of prior multivessel CABG.  No free air.  No evidence of dilated loops of bowel.  Gas is noted throughout the colon to the rectum.  Calcification of the bilateral vas deferens noted incidentally.  Surgical clips the right upper quadrant suggest prior cholecystectomy.  Lower lumbar degenerative disc disease and facet arthropathy.  No acute osseous abnormality.  IMPRESSION:  1.  No acute cardiopulmonary disease. 2.  Unremarkable, nonobstructed bowel gas pattern.   Original Report Authenticated By: Malachy Moan, M.D.     Results for MARQUIE, ADERHOLD (MRN [id number]) as of 10/21/2012 20:46  Ref. Range 09/18/2009 05:28 10/12/2012 05:35 10/21/2012 20:01  BUN Latest Range: 6-23 mg/dL 39 (H) 34 (H) 34 (H)  Creatinine Latest Range: 0.50-1.35 mg/dL 9.56 (H) 2.13 (H) 0.86 (H)    Results for GOODWIN, KAMPHAUS (MRN [id number]) as of 10/21/2012 20:46  Ref. Range 09/16/2009 02:40 09/17/2009 05:07 09/18/2009 05:28 10/12/2012 05:35 10/21/2012 20:01  Hemoglobin  Latest Range: 13.0-17.0 g/dL 52.8 (L) 41.3 (L) 24.4 (L) 13.2 12.8 (L)  HCT Latest Range: 39.0-52.0 % 35.4 (L) 35.1 (L) 35.2 (L) 38.6 (L) 36.7 (L)      2200:  Has tol PO well while in the ED without N/V.  No stooling while in the ED.  Workup without acute abnl; H/H and BUN/Cr per baseline.  Will not repeat CT scan as it was negative for acute process 5 days ago.  Feels better after meds and wants to go home now.  Pt encouraged to follow the diet his GI MD gave him (high fiber) and keep his colonoscopy appt for next week.  Dx and testing d/w pt and family.  Questions answered.  Verb understanding, agreeable to d/c home with outpt f/u.   Laray Anger, DO 10/24/12 1909

## 2012-10-21 NOTE — ED Notes (Signed)
Pt c/o abdominal pain that he describes as burning. Pt states "it starts in my belly and goes up to my throat".

## 2012-10-21 NOTE — ED Notes (Signed)
Pt reports abd pain from taking his Prilosec.  Pt reports "it burns when i take it".  Pt reports hx of stomach ulcer.

## 2012-10-22 ENCOUNTER — Ambulatory Visit (INDEPENDENT_AMBULATORY_CARE_PROVIDER_SITE_OTHER): Payer: Medicare Other | Admitting: Urology

## 2012-10-22 DIAGNOSIS — R338 Other retention of urine: Secondary | ICD-10-CM

## 2012-10-22 DIAGNOSIS — N401 Enlarged prostate with lower urinary tract symptoms: Secondary | ICD-10-CM

## 2012-10-23 LAB — URINE CULTURE
Colony Count: NO GROWTH
Culture: NO GROWTH

## 2012-10-25 ENCOUNTER — Telehealth: Payer: Self-pay | Admitting: *Deleted

## 2012-10-25 ENCOUNTER — Other Ambulatory Visit: Payer: Self-pay | Admitting: Internal Medicine

## 2012-10-25 DIAGNOSIS — K219 Gastro-esophageal reflux disease without esophagitis: Secondary | ICD-10-CM

## 2012-10-25 DIAGNOSIS — R1310 Dysphagia, unspecified: Secondary | ICD-10-CM

## 2012-10-25 MED ORDER — PANTOPRAZOLE SODIUM 40 MG PO TBEC: 40.0000 mg | DELAYED_RELEASE_TABLET | Freq: Two times a day (BID) | ORAL | Status: DC

## 2012-10-25 NOTE — Telephone Encounter (Signed)
Spoke with pt- he continues to have pain in his stomach, cant eat, + nausea, no vomiting,has watery diarrhea (last episode- was 3am this morning), bloating. Pt is concerned about drinking the trilyte and doing his procedure on Wednesday. He is not taking the prilosec or the protonix. Stopped miralax too. Pt went to ED last week and was given pepcid AC and "something to drink" and felt better for 2 days but the pain has come back again. Pt stated he was not taking pepto although ED note says he is taking pepto.    Pt stated 2-3 times that he would really like to speak with AS and would appreciate her calling him today if possible.

## 2012-10-25 NOTE — Addendum Note (Signed)
Addended by: Nira Retort on: 10/25/2012 01:42 PM   Modules accepted: Orders

## 2012-10-25 NOTE — Telephone Encounter (Signed)
DONE

## 2012-10-25 NOTE — Telephone Encounter (Signed)
Tried to call pt- LMOM 

## 2012-10-25 NOTE — Telephone Encounter (Signed)
Ms Faulcon called today for her son. He is having bad stomach pain. He is scheduled for a colonoscopy on Wednesday. Please follow up with them. Thank you.

## 2012-10-25 NOTE — Telephone Encounter (Signed)
I contacted pt and spoke with him first. He seems to be somewhat confused regarding the instructions we had given him regarding Prilosec. He claims he was not told to stop Prilosec. I sent a prescription for Protonix last week, and we have documentation that we asked him to stop Prilosec and start Protonix. He had also been taking pepto bismol, as documented in the ED note recently, despite [place] asking him to stop (he was complaining of constipation at the last visit).    He states he has stopped pepto. I asked to speak with his wife. She states understanding regarding the NEW prescription for Protonix instead of Prilosec. Protonix BID sent to pharmacy. Pt had noted Prilosec "messed" up his stomach. Question side-effect. Will stop Prilosec.   At this point, patient is very concerned about drinking the prep due to his symptoms. I have discussed with this wife, and we will cancel the colonoscopy. PROCEED ONLY WITH EGD. He will need a TCS in the very near future. CT and AAS on file, labs normal. No acute issues currently.

## 2012-10-25 NOTE — Telephone Encounter (Signed)
Chris [name],  Please cancel the TCS for this pt. Only proceed with EGD as planned.

## 2012-10-26 ENCOUNTER — Encounter (HOSPITAL_COMMUNITY): Payer: Self-pay | Admitting: Pharmacy Technician

## 2012-10-26 MED ORDER — SODIUM CHLORIDE 0.45 % IV SOLN
INTRAVENOUS | Status: DC
  Administered 2012-10-27: 08:00:00 via INTRAVENOUS

## 2012-10-27 ENCOUNTER — Ambulatory Visit (HOSPITAL_COMMUNITY): Admission: RE | Admit: 2012-10-27 | Payer: Medicare Other | Source: Ambulatory Visit | Admitting: Internal Medicine

## 2012-10-27 ENCOUNTER — Encounter (HOSPITAL_COMMUNITY): Payer: Self-pay

## 2012-10-27 ENCOUNTER — Encounter (HOSPITAL_COMMUNITY): Admission: RE | Payer: Self-pay | Source: Ambulatory Visit

## 2012-10-27 ENCOUNTER — Ambulatory Visit (HOSPITAL_COMMUNITY)
Admission: RE | Admit: 2012-10-27 | Discharge: 2012-10-27 | Disposition: A | Discharge status: Home / Self Care | Payer: Medicare Other | Source: Ambulatory Visit | Attending: Internal Medicine | Admitting: Internal Medicine

## 2012-10-27 ENCOUNTER — Encounter (HOSPITAL_COMMUNITY)
Admission: RE | Disposition: A | Discharge status: Home / Self Care | Payer: Self-pay | Source: Ambulatory Visit | Attending: Internal Medicine

## 2012-10-27 DIAGNOSIS — E119 Type 2 diabetes mellitus without complications: Secondary | ICD-10-CM | POA: Insufficient documentation

## 2012-10-27 DIAGNOSIS — R1310 Dysphagia, unspecified: Secondary | ICD-10-CM

## 2012-10-27 DIAGNOSIS — R110 Nausea: Secondary | ICD-10-CM | POA: Insufficient documentation

## 2012-10-27 DIAGNOSIS — K222 Esophageal obstruction: Secondary | ICD-10-CM | POA: Insufficient documentation

## 2012-10-27 DIAGNOSIS — R109 Unspecified abdominal pain: Secondary | ICD-10-CM

## 2012-10-27 DIAGNOSIS — I10 Essential (primary) hypertension: Secondary | ICD-10-CM | POA: Insufficient documentation

## 2012-10-27 DIAGNOSIS — Z794 Long term (current) use of insulin: Secondary | ICD-10-CM | POA: Insufficient documentation

## 2012-10-27 DIAGNOSIS — K225 Diverticulum of esophagus, acquired: Secondary | ICD-10-CM

## 2012-10-27 DIAGNOSIS — K219 Gastro-esophageal reflux disease without esophagitis: Secondary | ICD-10-CM

## 2012-10-27 DIAGNOSIS — Z01812 Encounter for preprocedural laboratory examination: Secondary | ICD-10-CM | POA: Insufficient documentation

## 2012-10-27 DIAGNOSIS — K5730 Diverticulosis of large intestine without perforation or abscess without bleeding: Secondary | ICD-10-CM | POA: Insufficient documentation

## 2012-10-27 DIAGNOSIS — R112 Nausea with vomiting, unspecified: Secondary | ICD-10-CM

## 2012-10-27 HISTORY — PX: ESOPHAGOGASTRODUODENOSCOPY (EGD) WITH ESOPHAGEAL DILATION: SHX5812

## 2012-10-27 SURGERY — COLONOSCOPY, ESOPHAGOGASTRODUODENOSCOPY (EGD) AND ESOPHAGEAL DILATION (ED)
Anesthesia: Moderate Sedation

## 2012-10-27 SURGERY — ESOPHAGOGASTRODUODENOSCOPY (EGD) WITH ESOPHAGEAL DILATION
Anesthesia: Moderate Sedation

## 2012-10-27 MED ORDER — ONDANSETRON HCL 4 MG/2ML IJ SOLN
INTRAMUSCULAR | Status: AC
  Filled 2012-10-27: qty 2

## 2012-10-27 MED ORDER — MEPERIDINE HCL 100 MG/ML IJ SOLN
INTRAMUSCULAR | Status: DC | PRN
  Administered 2012-10-27: 50 mg via INTRAVENOUS

## 2012-10-27 MED ORDER — MIDAZOLAM HCL 5 MG/5ML IJ SOLN
INTRAMUSCULAR | Status: DC | PRN
  Administered 2012-10-27: 1 mg via INTRAVENOUS
  Administered 2012-10-27: 2 mg via INTRAVENOUS

## 2012-10-27 MED ORDER — MIDAZOLAM HCL 5 MG/5ML IJ SOLN
INTRAMUSCULAR | Status: AC
  Filled 2012-10-27: qty 10

## 2012-10-27 MED ORDER — MEPERIDINE HCL 100 MG/ML IJ SOLN
INTRAMUSCULAR | Status: AC
  Filled 2012-10-27: qty 1

## 2012-10-27 MED ORDER — BUTAMBEN-TETRACAINE-BENZOCAINE 2-2-14 % EX AERO
INHALATION_SPRAY | CUTANEOUS | Status: DC | PRN
  Administered 2012-10-27: 2 via TOPICAL

## 2012-10-27 MED ORDER — ONDANSETRON HCL 4 MG/2ML IJ SOLN
INTRAMUSCULAR | Status: DC | PRN
  Administered 2012-10-27: 4 mg via INTRAVENOUS

## 2012-10-27 MED ORDER — STERILE WATER FOR IRRIGATION IR SOLN
Status: DC | PRN
  Administered 2012-10-27: 08:00:00

## 2012-10-27 NOTE — Interval H&P Note (Signed)
History and Physical Interval Note:  10/27/2012 8:05 AM  Chris [name]  has presented today for surgery, with the diagnosis of GERD AND DYSPHAGIA  The various methods of treatment have been discussed with the patient and family. After consideration of risks, benefits and other options for treatment, the patient has consented to  Procedure(s) (LRB) with comments: ESOPHAGOGASTRODUODENOSCOPY (EGD) WITH ESOPHAGEAL DILATION (N/A) - 7:30 as a surgical intervention .  The patient's history has been reviewed, patient examined, no change in status, stable for surgery.  I have reviewed the patient's chart and labs.  Questions were answered to the patient's satisfaction.     [name]  Patient symptoms without change. Start Protonix yesterday;  EGD with esophageal dilation as appropriate today.The risks, benefits, limitations, alternatives and imponderables have been reviewed with the patient. Potential for esophageal dilation, biopsy, etc. have also been reviewed.  Questions have been answered. All parties agreeable.

## 2012-10-27 NOTE — Op Note (Signed)
[facility] [address] [place], [address]   ENDOSCOPY PROCEDURE REPORT  PATIENT: Chris, [name]  MR#: [id number] BIRTHDATE: December 09, 1940 , 71  yrs. old GENDER: Male ENDOSCOPIST: R.  Roetta Sessions, MD FACP FACG REFERRED BY:  Ernestine Conrad, M.D. PROCEDURE DATE:  10/27/2012 PROCEDURE:     EGD with balloon dilation esophageal ring followed by biopsy  INDICATIONS:      Abdominal pain; intermittent nausea and vomiting. Esophageal dysphagia.  INFORMED CONSENT:   The risks, benefits, limitations, alternatives and imponderables have been discussed.  The potential for biopsy, esophogeal dilation, etc. have also been reviewed.  Questions have been answered.  All parties agreeable.  Please see the history and physical in the medical record for more information.  MEDICATIONS:   Versed 3 mg IV and Demerol 75 mg IV in divided doses. Cetacaine spray.  DESCRIPTION OF PROCEDURE:   The EG-[id number] ([id number])  endoscope was introduced through the mouth and advanced to the second portion of the duodenum without difficulty or limitations.  The mucosal surfaces were surveyed very carefully during advancement of the scope and upon withdrawal.  Retroflexion view of the proximal stomach and esophagogastric junction was performed.      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 There was an incomplete Schatzki's ring. There was also salmon-colored epithelium coming up above the GE junction across the ring into the diverticulum; there was also  a 2 mm distal esophageal nodule as well. No esophagitis. The GE junction was easily traversed; the ring appeared to be noncritical. Stomach empty. 3 cm hiatal hernia. Couple of antral erosions. No ulcer or infiltrating process. Pylorus was patent. Examination of thebulb and second portion revealed no abnormalities.  THERAPEUTIC / DIAGNOSTIC MANEUVERS PERFORMED:  A graduated TTS balloon was placed  across the ring and inflated to 20 mm and then taken down. The encroachment on the lumen of the esophagus at the level of the ring was felt to be non-critical. A maximal balloon dilation of 20 mm, placed across the ring appeared to not do you very much. There was a significant "ledge" on one quarter of the ring adjacent to the diverticulum. This was disrupted with "bite on bite" with cold biopsy forceps. This done without difficulty or apparent complicationl. The nodule was removed with one pass of the biopsy forceps. Finally, biopsies of the abnormal epithelium extending into the diverticulum were taken for histologic study.   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 Incomplete, noncritical, Schatzki's ring; status-  post balloon dilation biopsy forceps disruption. Distal esophageal nodule  - status post biopsy and removal. Salmon-colored distal esophageal epithelium. Query short segment Barrett's esophagus. Status post biopsy. Hiatal hernia. Antral erosions.  RECOMMENDATIONS:  Discussed with wife. Patient went to the ER with abdominal pain after taking one Protonix. It did not agree with him. We'll, therefore, stop Protonix; start Dexilant 60 mg orally daily. Go my office for free samples.  Followup on biopsies. Patient had a gastric emptying study recently at [facility] according to the wife. I do not have that report for repeat at this time. Further recommendations to follow.  _______________________________ R. Roetta Sessions, MD FACP [facility] eSigned:  R. Roetta Sessions, MD FACP [facility] 10/27/2012 9:06 AM     CC:  [name], [name] MR#: [id number]

## 2012-10-27 NOTE — Progress Notes (Signed)
#  3 boxes of dexilant left at front desk for pt to pick up per RMR.

## 2012-10-27 NOTE — H&P (View-Only) (Signed)
Primary Care Physician:  Ernestine Conrad, MD Primary Gastroenterologist:  Dr. Jena Gauss   Chief Complaint  Patient presents with  . Abdominal Pain    HPI:   Chris [name] is a 72 year old male who presents today as a referral from the ED secondary to abdominal pain. He presented twice to the ED with complaints of abdominal pain and distension. CT noted small hiatal hernia, stable prominence of prostate gland, chronic L1 superior endplate compression fracture, no etiology for abdominal pain.  He is a poor historian, and his wife is present with him. He notes right-sided abdominal discomfort, decreased appetite, +nausea. Notes intermittent indigestion, occasional pill and solid food dysphagia. Notes intermittent gas/burping. +constipation. He has been avoiding fried foods, taking OTC antacids and pepto. Notes scant hematochezia with straining. +wt gain, no wt loss. No prior TCS or EGD.   Lab Results  Component Value Date   WBC 6.7 10/12/2012   HGB 13.2 10/12/2012   HCT 38.6* 10/12/2012   MCV 98.5 10/12/2012   PLT 206 10/12/2012     Past Medical History  Diagnosis Date  . 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 Acute renal insufficiency     resolved  . Diabetes mellitus   . Macrocytosis     normal B12 & folate levels  . Ischemic cardiomyopathy     EF of 45-50% - posterior & inferior walls are severly hypokinetic  . Atrial fibrillation     not candidate for chronic anticoagulation with Coumadin given chrronic epistaxis as well as hemorrhagic conversion of his acute cva   . Hyperlipidemia   . Hypertension     Past Surgical History  Procedure Date  . Cholecystectomy   . Coronary artery bypass graft   . Tonsillectomy     Current Outpatient Prescriptions  Medication Sig Dispense Refill  . aspirin EC 81 MG tablet Take 81 mg by mouth daily.      . Cholecalciferol (VITAMIN D-3) 5000 UNITS TABS Take 5,000 Units by mouth  daily.       . finasteride (PROSCAR) 5 MG tablet Take 5 mg by mouth daily.      . furosemide (LASIX) 40 MG tablet Take 40 mg by mouth 2 (two) times daily.      . insulin NPH (HUMULIN N,NOVOLIN N) 100 UNIT/ML injection Inject 10-26 Units into the skin 2 (two) times daily. 26 Units in the morning & 10 Units every evening      . insulin regular (HUMULIN R,NOVOLIN R) 100 units/mL injection Inject 10 Units into the skin 2 (two) times daily. 10units in the morning and 10 units in the evening.      . lovastatin (MEVACOR) 40 MG tablet Take 40 mg by mouth at bedtime.        . magnesium oxide (MAG-OX) 400 MG tablet Take 400 mg by mouth daily.      . metoprolol (LOPRESSOR) 50 MG tablet Take 50 mg by mouth 2 (two) times daily.        . metoCLOPramide (REGLAN) 5 MG tablet Take 5 mg by mouth 2 (two) times daily as needed. Stomach Pain      . omeprazole (PRILOSEC) 20 MG capsule Take 1 capsule (20 mg total) by mouth daily. 30 minutes before breakfast.  30 capsule  3  . polyethylene glycol powder (GLYCOLAX/MIRALAX) powder Take 17 g by mouth daily as needed. For constipation.      . polyethylene glycol-electrolytes (TRILYTE) 420 G  solution Take 4,000 mLs by mouth as directed.  4000 mL  0  . prednisoLONE acetate (PRED FORTE) 1 % ophthalmic suspension Place 1 drop into both eyes daily as needed. Eye Irritation        Allergies as of 10/14/2012 - Review Complete 10/14/2012  Allergen Reaction Noted  . Ciprofloxacin Other (See Comments) 10/07/2012  . Xanax (alprazolam) Other (See Comments) 10/07/2012    Family History  Problem Relation Age of Onset  . Colon cancer Neg Hx     History   Social History  . Marital Status: Single    Spouse Name: N/A    Number of Children: N/A  . Years of Education: N/A   Occupational History  . Not on file.   Social History Main Topics  . Smoking status: Former Games developer  . Smokeless tobacco: Not on file  . Alcohol Use: No  . Drug Use: No  . Sexually Active: Not on file    Other Topics Concern  . Not on file   Social History Narrative  . No narrative on file    Review of Systems: Gen: SEE HPI CV: +palpitations Resp: Denies shortness of breath at rest or with exertion. Denies wheezing or cough.  GI: SEE HPI GU : Denies urinary burning, urinary frequency, urinary hesitancy MS: +back pain Derm: Denies rash, itching, dry skin Psych: + depression Heme: Denies bruising, bleeding, and enlarged lymph nodes.  Physical Exam: BP 141/62  Pulse 76  Temp 97.6 F (36.4 C) (Oral)  Ht 6\' 2"  (1.88 m)  Wt 249 lb 9.6 oz (113.218 kg)  BMI 32.05 kg/m2 General:   Alert and oriented. Pleasant and cooperative. Well-nourished and well-developed.  Head:  Normocephalic and atraumatic. Eyes:  Without icterus, sclera clear and conjunctiva pink.  Ears:  Normal auditory acuity. Nose:  No deformity, discharge,  or lesions. Mouth:  No deformity or lesions, oral mucosa pink.  Neck:  Supple, without mass or thyromegaly. Lungs:  Clear to auscultation bilaterally. No wheezes, rales, or rhonchi. No distress.  Heart:  S1, S2 present without murmurs appreciated.  Abdomen:  +BS, soft, non-tender and non-distended. No HSM noted. No guarding or rebound. No masses appreciated.  Rectal:  Deferred  Msk:  Symmetrical without gross deformities. Normal posture. Extremities:  Without clubbing or edema. Neurologic:  Alert and  oriented x4;  grossly normal neurologically. Skin:  Intact without significant lesions or rashes. Cervical Nodes:  No significant cervical adenopathy. Psych:  Alert and cooperative. Normal mood and affect.

## 2012-10-29 ENCOUNTER — Encounter: Payer: Self-pay | Admitting: Internal Medicine

## 2012-11-01 ENCOUNTER — Encounter: Payer: Self-pay | Admitting: *Deleted

## 2012-11-01 ENCOUNTER — Telehealth: Payer: Self-pay | Admitting: *Deleted

## 2012-11-01 ENCOUNTER — Encounter (HOSPITAL_COMMUNITY): Payer: Self-pay | Admitting: Internal Medicine

## 2012-11-01 NOTE — Telephone Encounter (Signed)
Ms Gaspard called today. Chris [name] has been experiencing some pain and bloating over the weekend. She gave him a dexilant this am which seems to have helped him. She would like a call back. Thank you.

## 2012-11-01 NOTE — Telephone Encounter (Signed)
Spoke with pt- he is still having a lot of bloating and belching and some pain, especially in the mornings. He takes a dexilant and the bloating and belching seem to go away but then comes back in the afternoon. He has been taking a pepcid AC in the afternoon. He stated it was hard to take care of his diabetes when he is having a hard time with eating. He is not sure what to do at this point.   Pt stated he would prefer a return call from RMR if possible.

## 2012-11-02 MED ORDER — DEXLANSOPRAZOLE 60 MG PO CPDR: 60.0000 mg | DELAYED_RELEASE_CAPSULE | Freq: Every day | ORAL | Status: DC

## 2012-11-02 NOTE — Telephone Encounter (Signed)
Sent rx to [facility] mail order pharmacy. Received GES from [facility] hospital. Faxed copy to [facility] for RMR to review.

## 2012-11-02 NOTE — Addendum Note (Signed)
Addended by: Myra Rude on: 11/02/2012 11:30 AM   Modules accepted: Orders

## 2012-11-02 NOTE — Telephone Encounter (Signed)
Per RMR- pt needs ov in 6 weeks with extender.  Dawn, please schedule.

## 2012-11-02 NOTE — Telephone Encounter (Signed)
I called patient today and discussed his condition. He just started Dexilant less than a week ago. Says it works well in the morning but wears off later -- complains primarily of belching and bloating. He takes Pepcid AC with good results in the evening. Patient states he had a normal gastric imaging study in December 2013 at an outside hospital. Those results are not available for review at this time. I suggested he continue Dexilant 60 mg orally in the morning and we will get him a prescription called in to his mail order pharmacy. He may use Pepcid AC in the evening. Further recommendations to follow pending review of GES.

## 2012-11-03 ENCOUNTER — Encounter: Payer: Self-pay | Admitting: Internal Medicine

## 2012-11-03 NOTE — Telephone Encounter (Signed)
appt made

## 2012-11-03 NOTE — Progress Notes (Unsigned)
Patient ID: Chris [name], male   DOB: Oct 14, 1940, 72 y.o.   MRN: [id number] I reviewed gastric imaging study report done on this patient at [facility] on 08/27/2012. Only 18% of radiotracer retained in the stomach at 120 minutes. This was felt to be a normal study.

## 2012-11-07 ENCOUNTER — Emergency Department (HOSPITAL_COMMUNITY): Payer: Medicare Other

## 2012-11-07 ENCOUNTER — Telehealth (INDEPENDENT_AMBULATORY_CARE_PROVIDER_SITE_OTHER): Payer: Self-pay | Admitting: Internal Medicine

## 2012-11-07 ENCOUNTER — Emergency Department (HOSPITAL_COMMUNITY)
Admission: EM | Admit: 2012-11-07 | Discharge: 2012-11-07 | Disposition: A | Discharge status: Home / Self Care | Payer: Medicare Other | Attending: Emergency Medicine | Admitting: Emergency Medicine

## 2012-11-07 ENCOUNTER — Encounter (HOSPITAL_COMMUNITY): Payer: Self-pay | Admitting: *Deleted

## 2012-11-07 DIAGNOSIS — R141 Gas pain: Secondary | ICD-10-CM | POA: Insufficient documentation

## 2012-11-07 DIAGNOSIS — E785 Hyperlipidemia, unspecified: Secondary | ICD-10-CM | POA: Insufficient documentation

## 2012-11-07 DIAGNOSIS — Z951 Presence of aortocoronary bypass graft: Secondary | ICD-10-CM | POA: Insufficient documentation

## 2012-11-07 DIAGNOSIS — Z87448 Personal history of other diseases of urinary system: Secondary | ICD-10-CM | POA: Insufficient documentation

## 2012-11-07 DIAGNOSIS — K219 Gastro-esophageal reflux disease without esophagitis: Secondary | ICD-10-CM | POA: Insufficient documentation

## 2012-11-07 DIAGNOSIS — R143 Flatulence: Secondary | ICD-10-CM | POA: Insufficient documentation

## 2012-11-07 DIAGNOSIS — Z79899 Other long term (current) drug therapy: Secondary | ICD-10-CM | POA: Insufficient documentation

## 2012-11-07 DIAGNOSIS — R142 Eructation: Secondary | ICD-10-CM | POA: Insufficient documentation

## 2012-11-07 DIAGNOSIS — I10 Essential (primary) hypertension: Secondary | ICD-10-CM | POA: Insufficient documentation

## 2012-11-07 DIAGNOSIS — Z7982 Long term (current) use of aspirin: Secondary | ICD-10-CM | POA: Insufficient documentation

## 2012-11-07 DIAGNOSIS — Z8673 Personal history of transient ischemic attack (TIA), and cerebral infarction without residual deficits: Secondary | ICD-10-CM | POA: Insufficient documentation

## 2012-11-07 DIAGNOSIS — R109 Unspecified abdominal pain: Secondary | ICD-10-CM

## 2012-11-07 DIAGNOSIS — E119 Type 2 diabetes mellitus without complications: Secondary | ICD-10-CM | POA: Insufficient documentation

## 2012-11-07 DIAGNOSIS — G479 Sleep disorder, unspecified: Secondary | ICD-10-CM | POA: Insufficient documentation

## 2012-11-07 DIAGNOSIS — Z9889 Other specified postprocedural states: Secondary | ICD-10-CM | POA: Insufficient documentation

## 2012-11-07 DIAGNOSIS — Z8679 Personal history of other diseases of the circulatory system: Secondary | ICD-10-CM | POA: Insufficient documentation

## 2012-11-07 DIAGNOSIS — Z872 Personal history of diseases of the skin and subcutaneous tissue: Secondary | ICD-10-CM | POA: Insufficient documentation

## 2012-11-07 DIAGNOSIS — Z87891 Personal history of nicotine dependence: Secondary | ICD-10-CM | POA: Insufficient documentation

## 2012-11-07 DIAGNOSIS — Z8719 Personal history of other diseases of the digestive system: Secondary | ICD-10-CM | POA: Insufficient documentation

## 2012-11-07 DIAGNOSIS — R630 Anorexia: Secondary | ICD-10-CM | POA: Insufficient documentation

## 2012-11-07 LAB — CBC WITH DIFFERENTIAL/PLATELET
Basophils Absolute: 0 10*3/uL (ref 0.0–0.1)
Basophils Relative: 0 % (ref 0–1)
HCT: 36 % — ABNORMAL LOW (ref 39.0–52.0)
Hemoglobin: 12.4 g/dL — ABNORMAL LOW (ref 13.0–17.0)
Lymphocytes Relative: 25 % (ref 12–46)
MCHC: 34.4 g/dL (ref 30.0–36.0)
Monocytes Absolute: 0.5 10*3/uL (ref 0.1–1.0)
Monocytes Relative: 8 % (ref 3–12)
Neutro Abs: 3.5 10*3/uL (ref 1.7–7.7)
Neutrophils Relative %: 65 % (ref 43–77)
WBC: 5.4 10*3/uL (ref 4.0–10.5)

## 2012-11-07 LAB — COMPREHENSIVE METABOLIC PANEL
BUN: 24 mg/dL — ABNORMAL HIGH (ref 6–23)
CO2: 29 mEq/L (ref 19–32)
Calcium: 9.2 mg/dL (ref 8.4–10.5)
Creatinine, Ser: 1.71 mg/dL — ABNORMAL HIGH (ref 0.50–1.35)
GFR calc Af Amer: 44 mL/min — ABNORMAL LOW (ref >90)
GFR calc non Af Amer: 38 mL/min — ABNORMAL LOW (ref >90)
Glucose, Bld: 269 mg/dL — ABNORMAL HIGH (ref 70–99)
Total Protein: 7 g/dL (ref 6.0–8.3)

## 2012-11-07 NOTE — ED Notes (Signed)
Patient with no complaints at this time. Respirations even and unlabored. Skin warm/dry. Discharge instructions reviewed with patient at this time. Patient given opportunity to voice concerns/ask questions. Patient discharged at this time and left Emergency Department with steady gait.  Patient and family with questions about bland diets. Information given to patient and family on this. Verbal understanding obtained.

## 2012-11-07 NOTE — Telephone Encounter (Signed)
Pt needs ov with [place] this week

## 2012-11-07 NOTE — Telephone Encounter (Signed)
Patient's wife called stating that her husband Chris [name] is not feeling any better he continues to complain of bloating and abdominal pain has diminished intake. He had EGD by Dr. Jena Gauss on 10/27/2012. His symptoms are the same as they were prior to the procedure. I also reviewed abdominopelvic CT that he had on 10/12/2012 which did not show any abnormality to account for his symptoms. Following the recommendations made. Stop Mevacor for the time being. Do not take furosemide today or tomorrow. Stay on full liquids. If unable to keep fluids down he can go back to emergency room again. Dr. Jena Gauss will make further recommendations tomorrow.

## 2012-11-07 NOTE — ED Provider Notes (Signed)
History  This chart was scribed for Donnetta Hutching, MD by Erskine Emery, ED Scribe. This patient was seen in room APA04/APA04 and the patient's care was started at 15:08.   CSN: [id number]  Arrival date & time 11/07/12  1325   First MD Initiated Contact with Patient 11/07/12 1508      Chief Complaint  Patient presents with  . Abdominal Pain  . burping     (Consider location/radiation/quality/duration/timing/severity/associated sxs/prior treatment) The history is provided by the patient and the spouse. No language interpreter was used.   Chris [name] is a 72 y.o. male who presents to the Emergency Department complaining of burning abdominal pain, abdominal bloating, and burping. Pt reports some associated sleep disturbance and decreased appetite. He reports one small bowel movement in the last 48 hours. Pt has been seeing his GI specialist, Dr. 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 Pt has no h/o bowel obstruction but a h/o constipation and surgical h/o cholecystectomy in 2008 and CABG in 2001. Pt was here 1/30 for the same complaint, where they did an abdominal x-ray.   Dr. Loney Hering in [place] is the pt's PCP and Dr. Jena Gauss is the pt's GI specialist.  Past Medical History  Diagnosis Date  . 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 Acute renal insufficiency     resolved  . Diabetes mellitus   . Macrocytosis     normal B12 & folate levels  . Ischemic cardiomyopathy     EF of 45-50% - posterior & inferior walls are severly hypokinetic  . Atrial fibrillation     not candidate for chronic anticoagulation with Coumadin given chrronic epistaxis as well as hemorrhagic conversion of his acute cva   . Hyperlipidemia   . Hypertension   . Complication of anesthesia   . PONV (postoperative nausea and vomiting)     Past Surgical History  Procedure  Laterality Date  . Cholecystectomy    . Coronary artery bypass graft    . Tonsillectomy    . Esophagogastroduodenoscopy (egd) with esophageal dilation N/A 10/27/2012    Procedure: ESOPHAGOGASTRODUODENOSCOPY (EGD) WITH ESOPHAGEAL DILATION;  Surgeon: Corbin Ade, MD;  Location: [facility];  Service: Endoscopy;  Laterality: N/A;  7:30    Family History  Problem Relation Age of Onset  . Colon cancer Neg Hx     History  Substance Use Topics  . Smoking status: Former Games developer  . Smokeless tobacco: Not on file  . Alcohol Use: No      Review of Systems  Gastrointestinal: Positive for abdominal pain and constipation.       Burping  Psychiatric/Behavioral: Positive for sleep disturbance.  All other systems reviewed and are negative.    Allergies  Ciprofloxacin and Xanax  Home Medications   Current Outpatient Rx  Name  Route  Sig  Dispense  Refill  . aspirin EC 81 MG tablet   Oral   Take 81 mg by mouth daily.         . Cholecalciferol (VITAMIN D-3) 5000 UNITS TABS   Oral   Take 5,000 Units by mouth daily.          Marland Kitchen dexlansoprazole (DEXILANT) 60 MG capsule   Oral   Take 1 capsule (60 mg total) by mouth daily.   90 capsule   3   . finasteride (PROSCAR) 5  MG tablet   Oral   Take 5 mg by mouth daily.         . furosemide (LASIX) 40 MG tablet   Oral   Take 40 mg by mouth 2 (two) times daily as needed (Swelling).          . insulin NPH (HUMULIN N,NOVOLIN N) 100 UNIT/ML injection   Subcutaneous   Inject 10-26 Units into the skin 2 (two) times daily. 26 Units in the morning & 10 Units every evening         . insulin regular (HUMULIN R,NOVOLIN R) 100 units/mL injection   Subcutaneous   Inject 10 Units into the skin 2 (two) times daily. 10units in the morning and 10 units in the evening.         . lovastatin (MEVACOR) 40 MG tablet   Oral   Take 40 mg by mouth at bedtime.           . magnesium oxide (MAG-OX) 400 MG tablet   Oral   Take 400 mg by  mouth daily.         . metoprolol (LOPRESSOR) 50 MG tablet   Oral   Take 50 mg by mouth 2 (two) times daily.           . polyethylene glycol powder (GLYCOLAX/MIRALAX) powder   Oral   Take 17 g by mouth daily as needed. For constipation.         . prednisoLONE acetate (PRED FORTE) 1 % ophthalmic suspension   Both Eyes   Place 1 drop into both eyes daily as needed. Eye Irritation         . polyethylene glycol-electrolytes (TRILYTE) 420 G solution   Oral   Take 4,000 mLs by mouth as directed.   4000 mL   0     Triage Vitals: BP 153/58  Pulse 60  Temp(Src) 98 F (36.7 C) (Oral)  Resp 16  Ht 6\' 2"  (1.88 m)  Wt 248 lb (112.492 kg)  BMI 31.83 kg/m2  SpO2 98%  Physical Exam  Nursing note and vitals reviewed. Constitutional: He is oriented to person, place, and time. He appears well-developed and well-nourished.  HENT:  Head: Normocephalic and atraumatic.  Eyes: Conjunctivae and EOM are normal. Pupils are equal, round, and reactive to light.  Neck: Normal range of motion. Neck supple.  Cardiovascular: Normal rate, regular rhythm and normal heart sounds.   Pulmonary/Chest: Effort normal and breath sounds normal.  Abdominal: Soft. Bowel sounds are normal. He exhibits distension. There is no tenderness.  Abdomen is slightly distended and protuberant but nontender.  Musculoskeletal: Normal range of motion.  Neurological: He is alert and oriented to person, place, and time.  Skin: Skin is warm and dry.  Psychiatric: He has a normal mood and affect.    ED Course  Procedures (including critical care time) DIAGNOSTIC STUDIES: Oxygen Saturation is 98% on room air, normal by my interpretation.    COORDINATION OF CARE: 15:18--I evaluated the patient and we discussed a treatment plan including acute abdominal series to which the pt agreed.   Results for orders placed during the hospital encounter of 11/07/12  COMPREHENSIVE METABOLIC PANEL      Result Value Range    Sodium 139  135 - 145 mEq/L   Potassium 4.8  3.5 - 5.1 mEq/L   Chloride 102  96 - 112 mEq/L   CO2 29  19 - 32 mEq/L   Glucose, Bld 269 (*) 70 - 99 mg/dL  BUN 24 (*) 6 - 23 mg/dL   Creatinine, Ser 1.61 (*) 0.50 - 1.35 mg/dL   Calcium 9.2  8.4 - 09.6 mg/dL   Total Protein 7.0  6.0 - 8.3 g/dL   Albumin 3.7  3.5 - 5.2 g/dL   AST 27  0 - 37 U/L   ALT 22  0 - 53 U/L   Alkaline Phosphatase 67  39 - 117 U/L   Total Bilirubin 1.1  0.3 - 1.2 mg/dL   GFR calc non Af Amer 38 (*) >90 mL/min   GFR calc Af Amer 44 (*) >90 mL/min  CBC WITH DIFFERENTIAL      Result Value Range   WBC 5.4  4.0 - 10.5 K/uL   RBC 3.61 (*) 4.22 - 5.81 MIL/uL   Hemoglobin 12.4 (*) 13.0 - 17.0 g/dL   HCT 04.5 (*) 40.9 - 81.1 %   MCV 99.7  78.0 - 100.0 fL   MCH 34.3 (*) 26.0 - 34.0 pg   MCHC 34.4  30.0 - 36.0 g/dL   RDW 91.4  78.2 - 95.6 %   Platelets 163  150 - 400 K/uL   Neutrophils Relative 65  43 - 77 %   Neutro Abs 3.5  1.7 - 7.7 K/uL   Lymphocytes Relative 25  12 - 46 %   Lymphs Abs 1.4  0.7 - 4.0 K/uL   Monocytes Relative 8  3 - 12 %   Monocytes Absolute 0.5  0.1 - 1.0 K/uL   Eosinophils Relative 2  0 - 5 %   Eosinophils Absolute 0.1  0.0 - 0.7 K/uL   Basophils Relative 0  0 - 1 %   Basophils Absolute 0.0  0.0 - 0.1 K/uL  LIPASE, BLOOD      Result Value Range   Lipase 14  11 - 59 U/L    Dg Abd Acute W/chest  11/07/2012  *RADIOLOGY REPORT*  Clinical Data: Abdominal pain, shortness of breath.  ACUTE ABDOMEN SERIES (ABDOMEN 2 VIEW & CHEST 1 VIEW)  Comparison:  10/21/2012  Findings: Cardiomegaly.  Prior CABG.  Lungs are clear.  No effusions.  Prior cholecystectomy.  Nonobstructive bowel gas pattern.  No free air.  No organomegaly or suspicious calcification.  Degenerative changes in the lumbar spine and hips.  IMPRESSION: Borderline cardiomegaly.  No acute cardiopulmonary disease.  Prior cholecystectomy.  No evidence of bowel obstruction or free air.   Original Report Authenticated By: Charlett Nose, M.D.      No diagnosis found.    MDM  Patient has chronic abdominal bloating. No acute abdomen. Abdominal series reveals no obstruction. White count normal. Can followup with primary care Dr.      I personally performed the services described in this documentation, which was scribed in my presence. The recorded information has been reviewed and is accurate.    Donnetta Hutching, MD 11/07/12 [phone]

## 2012-11-07 NOTE — ED Notes (Signed)
Pt states burning sensation to entire abdomen. Also states continuous burping. No burping noted in triage. States his abdomen was hurting so bad last night that he was unable to sleep. Pt sees GI for same.

## 2012-11-08 ENCOUNTER — Telehealth: Payer: Self-pay | Admitting: Urgent Care

## 2012-11-08 NOTE — Telephone Encounter (Signed)
Spoke w/ Dr Karilyn Cota Pt's wife called over the weekend with c/o abd pain & bloating States been to ER x 3 Needs OV this week Thanks

## 2012-11-08 NOTE — Telephone Encounter (Signed)
Patient was in the ER over the weekend w/abdominal pain and bloating they added Gas X and they are worried if its ok to take with the Dexilant and his wife is also asking for a diet to follow to keep him from going through this please advise

## 2012-11-08 NOTE — Telephone Encounter (Signed)
Pt may take both meds He can pick up gas/bloat diet as well. Keep OV this week as scheduled

## 2012-11-08 NOTE — Telephone Encounter (Signed)
Pt may take both meds

## 2012-11-08 NOTE — Telephone Encounter (Signed)
Pt aware, was scheduled for 11/25/12. Per KJ- pt needs urgent ov this week. Rescheduled him for tomorrow with AS. Please cancel 11/25/12 ov.

## 2012-11-09 ENCOUNTER — Encounter: Payer: Self-pay | Admitting: Gastroenterology

## 2012-11-09 ENCOUNTER — Ambulatory Visit (INDEPENDENT_AMBULATORY_CARE_PROVIDER_SITE_OTHER): Payer: Medicare Other | Admitting: Gastroenterology

## 2012-11-09 VITALS — BP 132/84 | Temp 98.3°F | Ht 74.0 in | Wt 246.4 lb

## 2012-11-09 DIAGNOSIS — K219 Gastro-esophageal reflux disease without esophagitis: Secondary | ICD-10-CM

## 2012-11-09 DIAGNOSIS — R109 Unspecified abdominal pain: Secondary | ICD-10-CM

## 2012-11-09 NOTE — Patient Instructions (Addendum)
Continue to take Dexilant 60 mg in the morning. You may take Pepcid in the afternoon/evening as needed.  Use Gas-X as needed. Please follow the bloating handout. I have also provided a diet for reflux.  You may resume your lasix as previously prescribed by your doctor.  Dr. Jena Gauss will see you again in a few weeks to see how you are doing and talk about a colonoscopy.   Diet for Gastroesophageal Reflux Disease, Adult Reflux (acid reflux) is when acid from your stomach flows up into the esophagus. When acid comes in contact with the esophagus, the acid causes irritation and soreness (inflammation) in the esophagus. When reflux happens often or so severely that it causes damage to the esophagus, it is called gastroesophageal reflux disease (GERD). Nutrition therapy can help ease the discomfort of GERD. FOODS OR DRINKS TO AVOID OR LIMIT  Smoking or chewing tobacco. Nicotine is one of the most potent stimulants to acid production in the gastrointestinal tract.  Caffeinated and decaffeinated coffee and black tea.  Regular or low-calorie carbonated beverages or energy drinks (caffeine-free carbonated beverages are allowed).   Strong spices, such as black pepper, white pepper, red pepper, cayenne, curry powder, and chili powder.  Peppermint or spearmint.  Chocolate.  High-fat foods, including meats and fried foods. Extra added fats including oils, butter, salad dressings, and nuts. Limit these to less than 8 tsp per day.  Fruits and vegetables if they are not tolerated, such as citrus fruits or tomatoes.  Alcohol.  Any food that seems to aggravate your condition. If you have questions regarding your diet, call your caregiver or a registered dietitian. OTHER THINGS THAT MAY HELP GERD INCLUDE:   Eating your meals slowly, in a relaxed setting.  Eating 5 to 6 small meals per day instead of 3 large meals.  Eliminating food for a period of time if it causes distress.  Not lying down until  3 hours after eating a meal.  Keeping the head of your bed raised 6 to 9 inches (15 to 23 cm) by using a foam wedge or blocks under the legs of the bed. Lying flat may make symptoms worse.  Being physically active. Weight loss may be helpful in reducing reflux in overweight or obese adults.  Wear loose fitting clothing EXAMPLE MEAL PLAN This meal plan is approximately 2,000 calories based on https://www.[facility].org/ meal planning guidelines. Breakfast   cup cooked oatmeal.  1 cup strawberries.  1 cup low-fat milk.  1 oz almonds. Snack  1 cup cucumber slices.  6 oz yogurt (made from low-fat or fat-free milk). Lunch  2 slice whole-wheat bread.  2 oz sliced [place].  2 tsp mayonnaise.  1 cup blueberries.  1 cup snap peas. Snack  6 whole-wheat crackers.  1 oz string cheese. Dinner   cup brown rice.  1 cup mixed veggies.  1 tsp olive oil.  3 oz grilled fish. Document Released: 09/08/2005 Document Revised: 12/01/2011 Document Reviewed: 07/25/2011 [facility] Patient Information 2013 [place], [place].

## 2012-11-09 NOTE — Progress Notes (Addendum)
Referring Provider: Ernestine Conrad, MD Primary Care Physician:  Ernestine Conrad, MD Primary Gastroenterologist: Dr. Jena Gauss   Chief Complaint  Patient presents with  . Bloated    HPI:   [name] returns today in follow-up after endoscopy; he is actually here as an urgent visit, as he has called into our office multiple times regarding abdominal pain, bloating. He has been to the ED several times. EGD by Dr. Jena Gauss without significant findings to account for symptoms. Dilation at time of EGD. He has had a non-contrast CT performed as documented previously in the medical record. He is somewhat of a poor historian. He is quite concerned that the PPI may be causing his symptoms. However, he has had these issues even prior to starting a PPI. He has thus far tried Prilosec, Protonix, and now is on Dexilant, all within seeing [place] initially in consult.  Notes severe gas over the weekend, had to get up and walk. Trouble sleeping due to severe gas discomfort. Cheerios for breakfast and dinner, sandwhich for lunch. Coffee three times a day. No prior colonoscopy. Intermittent nausea, states decreased appetite from this. Wants nausea medication. Hurts a lot after eating cereal. Apple juice goes down ok, not water. States water just "hits" and comes back. He continues to refuse proceeding with a colonoscopy at this time due to fear of not being able to tolerate the prep.   However, he notes some improvement with Dexilant in the morning, Pepcid in evening, and gas-x prn.   Past Medical History  Diagnosis Date  . 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 Acute renal insufficiency     resolved  . Diabetes mellitus   . Macrocytosis     normal B12 & folate levels  . Ischemic cardiomyopathy     EF of 45-50% - posterior & inferior walls are severly hypokinetic  . Atrial fibrillation     not candidate for chronic anticoagulation with Coumadin  given chrronic epistaxis as well as hemorrhagic conversion of his acute cva   . Hyperlipidemia   . Hypertension   . Complication of anesthesia   . PONV (postoperative nausea and vomiting)     Past Surgical History  Procedure Laterality Date  . Cholecystectomy    . Coronary artery bypass graft    . Tonsillectomy    . Esophagogastroduodenoscopy (egd) with esophageal dilation N/A 10/27/2012    Dr. Jena Gauss: distal esophageal diverticulum, non-complete Schatzki's ring s/p dilation, distal esophageal nodule with benign path, negative Barrett's    Current Outpatient Prescriptions  Medication Sig Dispense Refill  . aspirin EC 81 MG tablet Take 81 mg by mouth daily.      . Cholecalciferol (VITAMIN D-3) 5000 UNITS TABS Take 5,000 Units by mouth daily.       Marland Kitchen dexlansoprazole (DEXILANT) 60 MG capsule Take 1 capsule (60 mg total) by mouth daily.  90 capsule  3  . famotidine (PEPCID AC) 10 MG chewable tablet Chew 10 mg by mouth 2 (two) times daily.      . finasteride (PROSCAR) 5 MG tablet Take 5 mg by mouth daily.      . furosemide (LASIX) 40 MG tablet Take 40 mg by mouth 2 (two) times daily as needed (Swelling).       . insulin NPH (HUMULIN N,NOVOLIN N) 100 UNIT/ML injection Inject 10-26 Units into the skin 2 (two) times daily. 26 Units in the morning & 10 Units every evening      .  insulin regular (HUMULIN R,NOVOLIN R) 100 units/mL injection Inject 10 Units into the skin 2 (two) times daily. 10units in the morning and 10 units in the evening.      . lovastatin (MEVACOR) 40 MG tablet Take 40 mg by mouth at bedtime.        . magnesium oxide (MAG-OX) 400 MG tablet Take 400 mg by mouth daily.      . metoprolol (LOPRESSOR) 50 MG tablet Take 50 mg by mouth 2 (two) times daily.        . polyethylene glycol powder (GLYCOLAX/MIRALAX) powder Take 17 g by mouth daily as needed. For constipation.      . prednisoLONE acetate (PRED FORTE) 1 % ophthalmic suspension Place 1 drop into both eyes daily as needed. Eye  Irritation      . simethicone (GAS-X) 80 MG chewable tablet Chew 80 mg by mouth every 6 (six) hours as needed for flatulence.      . polyethylene glycol-electrolytes (TRILYTE) 420 G solution Take 4,000 mLs by mouth as directed.  4000 mL  0   No current facility-administered medications for this visit.    Allergies as of 11/09/2012 - Review Complete 11/07/2012  Allergen Reaction Noted  . Ciprofloxacin Other (See Comments) 10/07/2012  . Xanax (alprazolam) Other (See Comments) 10/07/2012    Family History  Problem Relation Age of Onset  . Colon cancer Neg Hx     History   Social History  . Marital Status: Married    Spouse Name: N/A    Number of Children: N/A  . Years of Education: N/A   Social History Main Topics  . Smoking status: Former Games developer  . Smokeless tobacco: None  . Alcohol Use: No  . Drug Use: No  . Sexually Active: None   Other Topics Concern  . None   Social History Narrative  . None    Review of Systems: Negative unless mentioned in HPI.   Physical Exam: BP 132/84  Temp(Src) 98.3 F (36.8 C) (Oral)  Ht 6\' 2"  (1.88 m)  Wt 246 lb 6.4 oz (111.766 kg)  BMI 31.62 kg/m2 General:   Alert and oriented. No distress noted. Pleasant and cooperative.  Head:  Normocephalic and atraumatic. Eyes:  Conjuctiva clear without scleral icterus. Mouth:  Oral mucosa pink and moist. Good dentition. No lesions. Heart:  S1, S2 present without murmurs, rubs, or gallops. Regular rate and rhythm. Abdomen:  +BS, soft, non-tender, appears mildly distended but SOFT. No rebound or guarding. No HSM or masses noted. Wife states she believes his "stomach is smaller than normal".  Msk:  Symmetrical without gross deformities. Normal posture. Extremities:  1-2+ edema bilaterally  Neurologic:  Alert and  oriented x4;  grossly normal neurologically. Skin:  Intact without significant lesions or rashes. Psych:  Alert and cooperative. Normal mood and affect.

## 2012-11-10 ENCOUNTER — Encounter: Payer: Self-pay | Admitting: Gastroenterology

## 2012-11-10 ENCOUNTER — Telehealth: Payer: Self-pay | Admitting: Gastroenterology

## 2012-11-10 NOTE — Telephone Encounter (Signed)
Noted! Thank you

## 2012-11-10 NOTE — Assessment & Plan Note (Signed)
EGD on file without evidence of Barrett's. Incomplete Schatzki's ring dilated. Seems to tolerate Dexilant well in the morning with Pepcid prn at night. I see no issue with this.

## 2012-11-10 NOTE — Progress Notes (Signed)
Faxed to PCP

## 2012-11-10 NOTE — Telephone Encounter (Signed)
Patient is scheduled for CTA on Thurs Feb 20th at 5:30 and Mrs. Bogart is aware

## 2012-11-10 NOTE — Telephone Encounter (Signed)
Pt called again to office. I spoke with Dr. Jena Gauss regarding pt's case. Dr. Jena Gauss has reviewed most recent CT with radiologist (Dr. Lanice Shirts). Recommended evaluation with CT angiogram. Need to assess for mesenteric ischemia. Needs colonoscopy in the future as well. CT angiogram as soon as possible to be scheduled.   I contacted pt's wife. She states patient has had no abdominal pain last night or this morning. He wants to eat eggs. He has had issues with his blood sugar fluctuating quite often. I informed her of the test we wanted to order. She states understanding.  Chris [name], I have put the order in. We need to have this done as soon as possible, if tomorrow it would be great.

## 2012-11-10 NOTE — Assessment & Plan Note (Addendum)
Described as gas discomfort, bloating. Non-contrast CT on file without concerning findings. EGD on file. Question if diet is causing some of his symptoms. He is concerned that the PPIs are causing these issues, and he brought the drug insert with him for me to review. However, he was having these issues prior to introduction of a PPI. He has failed Prilosec and Protonix. Will continue with Dexilant now, Pepcid prn in the evenings, gas-x prn as this has helped per pt. Bloat diet handout provided. GERD diet handout provided. Again, I stressed the importance of an initial screening colonoscopy. He still feels like this may be difficult due to his symptoms. He has agreed to consider this in the next few weeks, and he will follow up with Dr. Jena Gauss only at that time.

## 2012-11-11 ENCOUNTER — Ambulatory Visit (HOSPITAL_COMMUNITY)
Admission: RE | Admit: 2012-11-11 | Discharge: 2012-11-11 | Disposition: A | Discharge status: Home / Self Care | Payer: Medicare Other | Source: Ambulatory Visit | Attending: Gastroenterology | Admitting: Gastroenterology

## 2012-11-11 DIAGNOSIS — R109 Unspecified abdominal pain: Secondary | ICD-10-CM

## 2012-11-11 MED ORDER — IOHEXOL 350 MG/ML SOLN: 80.0000 mL | Freq: Once | INTRAVENOUS | Status: AC | PRN

## 2012-11-15 ENCOUNTER — Telehealth: Payer: Self-pay | Admitting: Gastroenterology

## 2012-11-15 MED ORDER — RABEPRAZOLE SODIUM 20 MG PO TBEC: 20.0000 mg | DELAYED_RELEASE_TABLET | Freq: Every day | ORAL | Status: DC

## 2012-11-15 NOTE — Telephone Encounter (Signed)
States he doesn't believe Dexilant is working. Took gas-x last night. Noted abdominal swelling when laying down. Had to get up and walk around, belch.  States he is having hot flashes, breaks out into a sweat. Felt like his feet were burning yesterday. Afebrile. Concerned regarding contrast for CT. Discussed drinking plenty of fluids before and after scan. Encouraged him to keep upcoming appt for Wednesday. Will need colonoscopy in future once CT reviewed for any evidence of mesenteric ischemia.   Aciphex sent to pharmacy.

## 2012-11-16 ENCOUNTER — Telehealth: Payer: Self-pay | Admitting: Gastroenterology

## 2012-11-16 NOTE — Telephone Encounter (Signed)
Returned pt's call. He states he has been studying the "bloat" diet.  States picked up the Aciphex yesterday and had a good night. Burping much improved.  States he thinks his system has "worked out its own plan".  He has been eating candy but realizes now this is not helpful.  Trying to change his diet, and now he is able to eat and drink water without regurgitating.  Cut down on the candy.  He notes significant improvement in his symptoms. He is requesting postponement of CT angiogram. I discussed with him the importance of evaluating for mesenteric ischemia. He continues to decline this.   We will cancel the test for tomorrow; the patient is well aware that our recommendations are proceeding with the CT. He has continued to decline this. I have asked him to give [place] a progress report in the next few days.   Soledad Gerlach, please cancel the CT angiogram. Thanks!

## 2012-11-16 NOTE — Telephone Encounter (Signed)
Pt called this morning asking for AS. He wants to go over things with her that they discussed yesterday. He is feeling better and wants to know if they can call off whatever he is scheduled for tomorrow. I told him AS is rounding at the hospital and would be in at 1030 to start seeing patients and that I would let her be aware that he called and she can call him back at [phone]

## 2012-11-16 NOTE — Telephone Encounter (Signed)
CT A has been cancelled

## 2012-11-17 ENCOUNTER — Ambulatory Visit (HOSPITAL_COMMUNITY): Admission: RE | Admit: 2012-11-17 | Payer: Medicare Other | Source: Ambulatory Visit

## 2012-11-18 ENCOUNTER — Telehealth: Payer: Self-pay | Admitting: Internal Medicine

## 2012-11-18 NOTE — Telephone Encounter (Signed)
Pt aware of what AS said and he is willing to have the CT now.  Leigh-Ann  Can you please reschedule him. Thanks

## 2012-11-18 NOTE — Telephone Encounter (Signed)
Patient is R/S for CT A on Tuesday March 4th at 2:15 and I spoke to Mrs. Fick and she is aware

## 2012-11-18 NOTE — Telephone Encounter (Signed)
Noted  

## 2012-11-18 NOTE — Telephone Encounter (Signed)
Pt's wife called to speak with AS. She didn't give any info about why she needed AS. Please call her at [phone]

## 2012-11-18 NOTE — Telephone Encounter (Signed)
I called and talked with Chris [name] and he said that the new medication (Aciphex) worked but then last night was bad he could not sleep, So this morning he took a Dexliant and it worked but he can not eat when he take the Dexliant. He wants to know if there is something he can take with the Dexliant to help he eat.Please advise

## 2012-11-18 NOTE — Telephone Encounter (Signed)
We really have no further recommendations at this point UNTIL he completes the CT angiogram. He called me earlier this week wanting to cancel this, despite our advice.  At this point, it is imperative he undergoes this test. We need to make sure we are not dealing with an ischemic process. Please let him know that is my recommendation. He has tried all of the PPIs already.   I am copying Dr. Jena Gauss on this as an FYI. I recommend a CT angiogram this week.

## 2012-11-18 NOTE — Telephone Encounter (Signed)
I agree with recommendations already made and have no new ones for the patient at this time.

## 2012-11-23 ENCOUNTER — Ambulatory Visit (HOSPITAL_COMMUNITY)
Admission: RE | Admit: 2012-11-23 | Discharge: 2012-11-23 | Disposition: A | Discharge status: Home / Self Care | Payer: Medicare Other | Source: Ambulatory Visit | Attending: Gastroenterology | Admitting: Gastroenterology

## 2012-11-23 DIAGNOSIS — R109 Unspecified abdominal pain: Secondary | ICD-10-CM | POA: Insufficient documentation

## 2012-11-23 DIAGNOSIS — R9389 Abnormal findings on diagnostic imaging of other specified body structures: Secondary | ICD-10-CM | POA: Insufficient documentation

## 2012-11-23 MED ORDER — IOHEXOL 350 MG/ML SOLN
80.0000 mL | Freq: Once | INTRAVENOUS | Status: AC | PRN
  Administered 2012-11-23: 80 mL via INTRAVENOUS

## 2012-11-24 NOTE — Progress Notes (Signed)
Quick Note:  No concern for mesenteric ischemia. SMA widely patent, celiac widely patent. Pancreatic atrophy Indeterminate lesion in right kidney, please cc results to PCP for further follow-up (renal ultrasound in 6 months).  Needs OV to discuss colonoscopy. I copied Dr. Jena Gauss for his review, patient well known to [place].  ______

## 2012-11-25 ENCOUNTER — Ambulatory Visit: Payer: Medicare Other | Admitting: Gastroenterology

## 2012-11-25 ENCOUNTER — Encounter: Payer: Self-pay | Admitting: Internal Medicine

## 2012-11-29 NOTE — Progress Notes (Signed)
Quick Note:  Tried to call pt- NA ______ 

## 2012-11-30 ENCOUNTER — Telehealth: Payer: Self-pay

## 2012-11-30 MED ORDER — ONDANSETRON HCL 4 MG PO TABS: 4.0000 mg | ORAL_TABLET | Freq: Three times a day (TID) | ORAL | Status: DC | PRN

## 2012-11-30 NOTE — Telephone Encounter (Signed)
Pts wife called today- pt still has the pain on the R side with nausea and he still cant eat. No vomiting. I asked her if he was still taking the Aciphex, and she stated they never got the aciphex- per the last 2 telephone notes the pt stated he was taking aciphex. I called the pharmacy and they stated he picked it up on 11/15/12. The wife said they were unable to get any more dexilant because it has a 200.00 copay. He is taking the gas-x.  She said AS was going to send something to the pharmacy to help with his stomach and they never received it either. They want to know what they need to do now.

## 2012-11-30 NOTE — Telephone Encounter (Signed)
I have sent prescription for Zofran BID. Take in morning and night. Avoid constipation.  Miralax daily as needed for constipation. Hopefully this will help with the nausea.

## 2012-11-30 NOTE — Telephone Encounter (Signed)
Needs colonoscopy per plan

## 2012-11-30 NOTE — Telephone Encounter (Signed)
Make sure taking the Aciphex. I will need to discuss this further with Dr. Jena Gauss. He still needs a colonoscopy, which will be scheduled in near future. Pancreatic atrophy noted on abdominal imaging; I am unsure if this is simply due to aging or different etiology.  If not already on a probiotic, make sure he is taking daily.  Further recommendations after case is discussed further.

## 2012-11-30 NOTE — Telephone Encounter (Signed)
Pt aware. He is taking the generic for Aciphex. Informed him to keep appt with RMR.

## 2012-12-01 NOTE — Progress Notes (Signed)
Faxed to Dr Loney Hering

## 2012-12-03 ENCOUNTER — Encounter: Payer: Self-pay | Admitting: Internal Medicine

## 2012-12-06 ENCOUNTER — Encounter: Payer: Self-pay | Admitting: Internal Medicine

## 2012-12-13 ENCOUNTER — Ambulatory Visit: Payer: Medicare Other | Admitting: Gastroenterology

## 2012-12-15 ENCOUNTER — Encounter: Payer: Self-pay | Admitting: Internal Medicine

## 2012-12-15 ENCOUNTER — Ambulatory Visit: Payer: Medicare Other | Admitting: Internal Medicine

## 2012-12-15 ENCOUNTER — Ambulatory Visit (INDEPENDENT_AMBULATORY_CARE_PROVIDER_SITE_OTHER): Payer: Medicare Other | Admitting: Internal Medicine

## 2012-12-15 VITALS — BP 168/70 | HR 71 | Temp 97.5°F | Ht 74.0 in | Wt 244.4 lb

## 2012-12-15 DIAGNOSIS — R143 Flatulence: Secondary | ICD-10-CM

## 2012-12-15 DIAGNOSIS — R1013 Epigastric pain: Secondary | ICD-10-CM

## 2012-12-15 DIAGNOSIS — R140 Abdominal distension (gaseous): Secondary | ICD-10-CM

## 2012-12-15 DIAGNOSIS — K219 Gastro-esophageal reflux disease without esophagitis: Secondary | ICD-10-CM

## 2012-12-15 DIAGNOSIS — K3189 Other diseases of stomach and duodenum: Secondary | ICD-10-CM

## 2012-12-15 NOTE — Patient Instructions (Addendum)
Continue Aciphex 20 mg daily  Add Carafate 1 gram 4x daily  Schedule colonoscopy soon

## 2012-12-15 NOTE — Progress Notes (Signed)
Primary Care Physician:  BLUTH, KIRK, MD Primary Gastroenterologist:  Dr. Dayten Juba  Pre-Procedure History & Physical: HPI:  Chris [name] is a 71 y.o. male here for followup of abdominal bloating, intermittent vague abdominal pain. CT angiogram negative for mesenteric ischemia or other process that would explain any significant degree of abdominal pain. 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 EGD-Shatzki's ring - dilated recently. Patient swallowing better. No significant bowel issues  - denies constipation, diarrhea, melena or hematochezia. His never had a colonoscopy. He seen Dr. Wrenn for urinary tract issues. Multiple emergency department visits. Patient appears to be perseverating regarding some of his upper GI tract symptoms. Multiple emergency department visits. Overall symptoms, better with the institution of low-dose lorazepam. Also, AcipHex 20 mg daily seems to be helping his symptoms as well.  Prior course of metoclopramide did not help. Gastric emptying study well within normal limits in December of last year.  Past Medical History  Diagnosis Date  . 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 Acute renal insufficiency     resolved  . Diabetes mellitus   . Macrocytosis     normal B12 & folate levels  . Ischemic cardiomyopathy     EF of 45-50% - posterior & inferior walls are severly hypokinetic  . Atrial fibrillation     not candidate for chronic anticoagulation with Coumadin given chrronic epistaxis as well as hemorrhagic conversion of his acute cva   . Hyperlipidemia   . Hypertension   . Complication of anesthesia   . PONV (postoperative nausea and vomiting)     Past Surgical History  Procedure Laterality Date  . Cholecystectomy    . Coronary artery bypass graft    . Tonsillectomy    . Esophagogastroduodenoscopy (egd) with esophageal dilation N/A  10/27/2012    Dr. Deryl Ports: distal esophageal diverticulum, non-complete Schatzki's ring s/p dilation, distal esophageal nodule with benign path, negative Barrett's    Prior to Admission medications   Medication Sig Start Date End Date Taking? Authorizing Provider  aspirin EC 81 MG tablet Take 81 mg by mouth daily.   Yes Historical Provider, MD  Cholecalciferol (VITAMIN D-3) 5000 UNITS TABS Take 5,000 Units by mouth daily.    Yes Historical Provider, MD  famotidine (PEPCID AC) 10 MG chewable tablet Chew 10 mg by mouth 2 (two) times daily.   Yes Historical Provider, MD  finasteride (PROSCAR) 5 MG tablet Take 5 mg by mouth daily. 10/08/12  Yes Historical Provider, MD  furosemide (LASIX) 40 MG tablet Take 40 mg by mouth 2 (two) times daily as needed (Swelling).    Yes Historical Provider, MD  insulin NPH (HUMULIN N,NOVOLIN N) 100 UNIT/ML injection Inject 16 Units into the skin 2 (two) times daily. 16 Units in the morning   Yes Historical Provider, MD  insulin regular (HUMULIN R,NOVOLIN R) 100 units/mL injection Inject 6 Units into the skin 2 (two) times daily.    Yes Historical Provider, MD  lovastatin (MEVACOR) 40 MG tablet Take 40 mg by mouth at bedtime.     Yes Historical Provider, MD  magnesium oxide (MAG-OX) 400 MG tablet Take 400 mg by mouth daily.   Yes Historical Provider, MD  metoprolol (LOPRESSOR) 50 MG tablet Take 50 mg by mouth 2 (two) times daily.     Yes Historical Provider, MD  ondansetron (ZOFRAN) 4 MG tablet Take 1 tablet (4 mg total)   by mouth every 8 (eight) hours as needed for nausea. 11/30/12  Yes Anna W Sams, NP  polyethylene glycol powder (GLYCOLAX/MIRALAX) powder Take 17 g by mouth daily as needed. For constipation. 10/14/12  Yes Anna W Sams, NP  prednisoLONE acetate (PRED FORTE) 1 % ophthalmic suspension Place 1 drop into both eyes daily as needed. Eye Irritation   Yes Historical Provider, MD  RABEprazole (ACIPHEX) 20 MG tablet Take 1 tablet (20 mg total) by mouth daily. 11/15/12  Yes  Anna W Sams, NP  simethicone (GAS-X) 80 MG chewable tablet Chew 80 mg by mouth every 6 (six) hours as needed for flatulence.   Yes Historical Provider, MD  dexlansoprazole (DEXILANT) 60 MG capsule Take 1 capsule (60 mg total) by mouth daily. 11/02/12   Liley Rake M Magaline Steinberg, MD  polyethylene glycol-electrolytes (TRILYTE) 420 G solution Take 4,000 mLs by mouth as directed. 10/14/12   Aleza Pew M Carrick Rijos, MD    Allergies as of 12/15/2012 - Review Complete 12/15/2012  Allergen Reaction Noted  . Ciprofloxacin Other (See Comments) 10/07/2012  . Xanax (alprazolam) Other (See Comments) 10/07/2012    Family History  Problem Relation Age of Onset  . Colon cancer Neg Hx     History   Social History  . Marital Status: Married    Spouse Name: N/A    Number of Children: N/A  . Years of Education: N/A   Occupational History  . Not on file.   Social History Main Topics  . Smoking status: Former Smoker  . Smokeless tobacco: Not on file  . Alcohol Use: No  . Drug Use: No  . Sexually Active: Not on file   Other Topics Concern  . Not on file   Social History Narrative  . No narrative on file    Review of Systems: See HPI, otherwise negative ROS  Physical Exam: BP 168/70  Pulse 71  Temp(Src) 97.5 F (36.4 C) (Oral)  Ht 6' 2" (1.88 m)  Wt 244 lb 6.4 oz (110.859 kg)  BMI 31.37 kg/m2 General:   Alert,  Well-developed, well-nourished, obese, pleasant and cooperative in NAD Skin:  Intact without significant lesions or rashes. Eyes:  Sclera clear, no icterus.   Conjunctiva pink. Ears:  Normal auditory acuity. Nose:  No deformity, discharge,  or lesions. Mouth:  No deformity or lesions. Neck:  Supple; no masses or thyromegaly. No significant cervical adenopathy. Lungs:  Clear throughout to auscultation.   No wheezes, crackles, or rhonchi. No acute distress. Heart:  Regular rate and rhythm; no murmurs, clicks, rubs,  or gallops. Abdomen: Obese. Positive bowel sounds. Soft entirely nontender  without appreciable mass or organomegaly  Pulses:  Normal pulses noted. Extremities:  Without clubbing or edema.  Impression/Plan:  71-year-old gentleman with rather nonspecific symptoms of gas bloating and vague abdominal discomfort. Paucity of actual nausea or vomiting. No significant change in bowel function. No real alarm symptoms. I feel it is somewhat reassuring as to his negative workup thus far. I do feel that this nice gentleman is somewhat preoccupied with his bowel symptoms.  He likely does have an element of GERD.  He is far overdue for his first ever screening colonoscopy   Recommendations:  Continue AcipHex 20 mg daily. Continue low-dose lorazepam. I'll add Carafate suspension 1 g 4 times a day to his regimen. I recommended to both he and his wife that he undergo his first ever screening colonoscopy in the near future.The risks, benefits, limitations, alternatives and imponderables have been reviewed with the patient.   Questions have been answered. All parties are agreeable.   Patient needs a renal ultrasound in 6 months per radiologist recommendations. 

## 2012-12-16 ENCOUNTER — Encounter (HOSPITAL_COMMUNITY): Payer: Self-pay | Admitting: Pharmacy Technician

## 2012-12-16 NOTE — Progress Notes (Signed)
Nicd in the computer for 6 month f/u Renal U/S

## 2012-12-23 ENCOUNTER — Ambulatory Visit (HOSPITAL_COMMUNITY)
Admission: RE | Admit: 2012-12-23 | Discharge: 2012-12-23 | Disposition: A | Discharge status: Home / Self Care | Payer: Medicare Other | Source: Ambulatory Visit | Attending: Internal Medicine | Admitting: Internal Medicine

## 2012-12-23 ENCOUNTER — Encounter (HOSPITAL_COMMUNITY)
Admission: RE | Disposition: A | Discharge status: Home / Self Care | Payer: Self-pay | Source: Ambulatory Visit | Attending: Internal Medicine

## 2012-12-23 ENCOUNTER — Encounter (HOSPITAL_COMMUNITY): Payer: Self-pay | Admitting: *Deleted

## 2012-12-23 DIAGNOSIS — E119 Type 2 diabetes mellitus without complications: Secondary | ICD-10-CM | POA: Insufficient documentation

## 2012-12-23 DIAGNOSIS — Z1211 Encounter for screening for malignant neoplasm of colon: Secondary | ICD-10-CM | POA: Insufficient documentation

## 2012-12-23 DIAGNOSIS — D126 Benign neoplasm of colon, unspecified: Secondary | ICD-10-CM | POA: Insufficient documentation

## 2012-12-23 DIAGNOSIS — Z883 Allergy status to other anti-infective agents status: Secondary | ICD-10-CM | POA: Insufficient documentation

## 2012-12-23 DIAGNOSIS — Z79899 Other long term (current) drug therapy: Secondary | ICD-10-CM | POA: Insufficient documentation

## 2012-12-23 DIAGNOSIS — D129 Benign neoplasm of anus and anal canal: Secondary | ICD-10-CM | POA: Insufficient documentation

## 2012-12-23 DIAGNOSIS — Q391 Atresia of esophagus with tracheo-esophageal fistula: Secondary | ICD-10-CM | POA: Insufficient documentation

## 2012-12-23 DIAGNOSIS — R140 Abdominal distension (gaseous): Secondary | ICD-10-CM

## 2012-12-23 DIAGNOSIS — Z7982 Long term (current) use of aspirin: Secondary | ICD-10-CM | POA: Insufficient documentation

## 2012-12-23 DIAGNOSIS — Q619 Cystic kidney disease, unspecified: Secondary | ICD-10-CM | POA: Insufficient documentation

## 2012-12-23 DIAGNOSIS — E785 Hyperlipidemia, unspecified: Secondary | ICD-10-CM | POA: Insufficient documentation

## 2012-12-23 DIAGNOSIS — K620 Anal polyp: Secondary | ICD-10-CM

## 2012-12-23 DIAGNOSIS — R1013 Epigastric pain: Secondary | ICD-10-CM

## 2012-12-23 DIAGNOSIS — Z8673 Personal history of transient ischemic attack (TIA), and cerebral infarction without residual deficits: Secondary | ICD-10-CM | POA: Insufficient documentation

## 2012-12-23 DIAGNOSIS — I2589 Other forms of chronic ischemic heart disease: Secondary | ICD-10-CM | POA: Insufficient documentation

## 2012-12-23 DIAGNOSIS — Z888 Allergy status to other drugs, medicaments and biological substances status: Secondary | ICD-10-CM | POA: Insufficient documentation

## 2012-12-23 DIAGNOSIS — D128 Benign neoplasm of rectum: Secondary | ICD-10-CM | POA: Insufficient documentation

## 2012-12-23 DIAGNOSIS — Z87891 Personal history of nicotine dependence: Secondary | ICD-10-CM | POA: Insufficient documentation

## 2012-12-23 DIAGNOSIS — I10 Essential (primary) hypertension: Secondary | ICD-10-CM | POA: Insufficient documentation

## 2012-12-23 DIAGNOSIS — K219 Gastro-esophageal reflux disease without esophagitis: Secondary | ICD-10-CM

## 2012-12-23 DIAGNOSIS — Z794 Long term (current) use of insulin: Secondary | ICD-10-CM | POA: Insufficient documentation

## 2012-12-23 DIAGNOSIS — K621 Rectal polyp: Secondary | ICD-10-CM

## 2012-12-23 DIAGNOSIS — I4891 Unspecified atrial fibrillation: Secondary | ICD-10-CM | POA: Insufficient documentation

## 2012-12-23 HISTORY — PX: COLONOSCOPY: SHX5424

## 2012-12-23 LAB — GLUCOSE, CAPILLARY: Glucose-Capillary: 138 mg/dL — ABNORMAL HIGH (ref 70–99)

## 2012-12-23 SURGERY — COLONOSCOPY
Anesthesia: Moderate Sedation

## 2012-12-23 MED ORDER — MEPERIDINE HCL 100 MG/ML IJ SOLN
INTRAMUSCULAR | Status: DC | PRN
  Administered 2012-12-23: 50 mg via INTRAVENOUS

## 2012-12-23 MED ORDER — ONDANSETRON HCL 4 MG/2ML IJ SOLN
INTRAMUSCULAR | Status: AC
  Filled 2012-12-23: qty 2

## 2012-12-23 MED ORDER — SIMETHICONE 40 MG/0.6ML PO SUSP
ORAL | Status: DC | PRN
  Administered 2012-12-23: 10:00:00

## 2012-12-23 MED ORDER — MEPERIDINE HCL 100 MG/ML IJ SOLN
INTRAMUSCULAR | Status: AC
  Filled 2012-12-23: qty 2

## 2012-12-23 MED ORDER — ONDANSETRON HCL 4 MG/2ML IJ SOLN
INTRAMUSCULAR | Status: DC | PRN
  Administered 2012-12-23: 4 mg via INTRAVENOUS

## 2012-12-23 MED ORDER — MIDAZOLAM HCL 5 MG/5ML IJ SOLN
INTRAMUSCULAR | Status: AC
  Filled 2012-12-23: qty 10

## 2012-12-23 MED ORDER — MIDAZOLAM HCL 5 MG/5ML IJ SOLN
INTRAMUSCULAR | Status: DC | PRN
  Administered 2012-12-23: 2 mg via INTRAVENOUS

## 2012-12-23 MED ORDER — SODIUM CHLORIDE 0.9 % IV SOLN
INTRAVENOUS | Status: DC
  Administered 2012-12-23: 09:00:00 via INTRAVENOUS

## 2012-12-23 NOTE — Interval H&P Note (Signed)
History and Physical Interval Note:  12/23/2012 10:12 AM  Chris [name]  has presented today for surgery, with the diagnosis of screening  The various methods of treatment have been discussed with the patient and family. After consideration of risks, benefits and other options for treatment, the patient has consented to  Procedure(s) with comments: COLONOSCOPY (N/A) - 9:45 as a surgical intervention .  The patient's history has been reviewed, patient examined, no change in status, stable for surgery.  I have reviewed the patient's chart and labs.  Questions were answered to the patient's satisfaction.     Colonoscopy (first ever screening exam) per plan today.The risks, benefits, limitations, alternatives and imponderables have been reviewed with the patient. Questions have been answered. All parties are agreeable.    Chris [name]

## 2012-12-23 NOTE — Interval H&P Note (Signed)
History and Physical Interval Note:  12/23/2012 10:19 AM  Chris [name]  has presented today for surgery, with the diagnosis of screening  The various methods of treatment have been discussed with the patient and family. After consideration of risks, benefits and other options for treatment, the patient has consented to  Procedure(s) with comments: COLONOSCOPY (N/A) - 9:45 as a surgical intervention .  The patient's history has been reviewed, patient examined, no change in status, stable for surgery.  I have reviewed the patient's chart and labs.  Questions were answered to the patient's satisfaction.     Chris [name]  Colonoscopy, first ever screening examination, today per plan.  The risks, benefits, limitations, alternatives and imponderables have been reviewed with the patient. Questions have been answered. All parties are agreeable.

## 2012-12-23 NOTE — Op Note (Signed)
[facility] [address] [place], [address]   COLONOSCOPY PROCEDURE REPORT  PATIENT: Chris [name], Chris [name]  MR#:         [id number] BIRTHDATE: 15-Jan-1941 , 71  yrs. old GENDER: Male ENDOSCOPIST: R.  Roetta Sessions, MD FACP FACG REFERRED BY:  Ernestine Conrad, M.D. PROCEDURE DATE:  12/23/2012 PROCEDURE:     Colonoscopy with biopsy and multiple snare polypectomies  INDICATIONS: First-ever average risk screening colonoscopy  INFORMED CONSENT:  The risks, benefits, alternatives and imponderables including but not limited to bleeding, perforation as well as the possibility of a missed lesion have been reviewed.  The potential for biopsy, lesion removal, etc. have also been discussed.  Questions have been answered.  All parties agreeable. Please see the history and physical in the medical record for more information.  MEDICATIONS: Versed 3 mg IV and Demerol 50 mg IV in divided doses. Zofran 4 mg IV.  DESCRIPTION OF PROCEDURE:  After a digital rectal exam was performed, the EC-[id number] ([id number])  colonoscope was advanced from the anus through the rectum and colon to the area of the cecum, ileocecal valve and appendiceal orifice.  The cecum was deeply intubated.  These structures were well-seen and photographed for the record.  From the level of the cecum and ileocecal valve, the scope was slowly and cautiously withdrawn.  The mucosal surfaces were carefully surveyed utilizing scope tip deflection to facilitate fold flattening as needed.  The scope was pulled down into the rectum where a thorough examination including retroflexion was performed.    FINDINGS:  Adequate preparation. Normal rectum. (1)  5 mm pedunculated  polyp in the mid sigmoid segment. The patient had another (1) 5mm transverse colon polyp and (2) 5 mm polyp in the mid ascending segment.  There was a single diminutive polyp in the rectum at 10 cm from the anal verge.  THERAPEUTIC / DIAGNOSTIC MANEUVERS  PERFORMED:  The above-mentioned polyps were either cold/hot snared or cold biopsied - removed.  COMPLICATIONS: None  CECAL WITHDRAWAL TIME:  14 minutes  IMPRESSION:  Multiple rectal and colonic polyps-removed as described above  RECOMMENDATIONS: Followup on pathology. As far as dyspepsia symptoms are concerned, he is doing very well on AcipHex and Carafate-he should continue that regimen.   _______________________________ eSigned:  R. Roetta Sessions, MD FACP [facility] 12/23/2012 11:07 AM   CC:

## 2012-12-23 NOTE — H&P (View-Only) (Signed)
Primary Care Physician:  Ernestine Conrad, MD Primary Gastroenterologist:  Dr. Jena Gauss  Pre-Procedure History & Physical: HPI:  BERT PTACEK is a 72 y.o. male here for followup of abdominal bloating, intermittent vague abdominal pain. CT angiogram negative for mesenteric ischemia or other process that would explain any significant degree of abdominal pain. 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 EGD-Shatzki's ring - dilated recently. Patient swallowing better. No significant bowel issues  - denies constipation, diarrhea, melena or hematochezia. His never had a colonoscopy. He seen Dr. Annabell Howells for urinary tract issues. Multiple emergency department visits. Patient appears to be perseverating regarding some of his upper GI tract symptoms. Multiple emergency department visits. Overall symptoms, better with the institution of low-dose lorazepam. Also, AcipHex 20 mg daily seems to be helping his symptoms as well.  Prior course of metoclopramide did not help. Gastric emptying study well within normal limits in December of last year.  Past Medical History  Diagnosis Date  . 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 Acute renal insufficiency     resolved  . Diabetes mellitus   . Macrocytosis     normal B12 & folate levels  . Ischemic cardiomyopathy     EF of 45-50% - posterior & inferior walls are severly hypokinetic  . Atrial fibrillation     not candidate for chronic anticoagulation with Coumadin given chrronic epistaxis as well as hemorrhagic conversion of his acute cva   . Hyperlipidemia   . Hypertension   . Complication of anesthesia   . PONV (postoperative nausea and vomiting)     Past Surgical History  Procedure Laterality Date  . Cholecystectomy    . Coronary artery bypass graft    . Tonsillectomy    . Esophagogastroduodenoscopy (egd) with esophageal dilation N/A  10/27/2012    Dr. Jena Gauss: distal esophageal diverticulum, non-complete Schatzki's ring s/p dilation, distal esophageal nodule with benign path, negative Barrett's    Prior to Admission medications   Medication Sig Start Date End Date Taking? Authorizing Provider  aspirin EC 81 MG tablet Take 81 mg by mouth daily.   Yes Historical Provider, MD  Cholecalciferol (VITAMIN D-3) 5000 UNITS TABS Take 5,000 Units by mouth daily.    Yes Historical Provider, MD  famotidine (PEPCID AC) 10 MG chewable tablet Chew 10 mg by mouth 2 (two) times daily.   Yes Historical Provider, MD  finasteride (PROSCAR) 5 MG tablet Take 5 mg by mouth daily. 10/08/12  Yes Historical Provider, MD  furosemide (LASIX) 40 MG tablet Take 40 mg by mouth 2 (two) times daily as needed (Swelling).    Yes Historical Provider, MD  insulin NPH (HUMULIN N,NOVOLIN N) 100 UNIT/ML injection Inject 16 Units into the skin 2 (two) times daily. 16 Units in the morning   Yes Historical Provider, MD  insulin regular (HUMULIN R,NOVOLIN R) 100 units/mL injection Inject 6 Units into the skin 2 (two) times daily.    Yes Historical Provider, MD  lovastatin (MEVACOR) 40 MG tablet Take 40 mg by mouth at bedtime.     Yes Historical Provider, MD  magnesium oxide (MAG-OX) 400 MG tablet Take 400 mg by mouth daily.   Yes Historical Provider, MD  metoprolol (LOPRESSOR) 50 MG tablet Take 50 mg by mouth 2 (two) times daily.     Yes Historical Provider, MD  ondansetron (ZOFRAN) 4 MG tablet Take 1 tablet (4 mg total)  by mouth every 8 (eight) hours as needed for nausea. 11/30/12  Yes Nira Retort, NP  polyethylene glycol powder (GLYCOLAX/MIRALAX) powder Take 17 g by mouth daily as needed. For constipation. 10/14/12  Yes Nira Retort, NP  prednisoLONE acetate (PRED FORTE) 1 % ophthalmic suspension Place 1 drop into both eyes daily as needed. Eye Irritation   Yes Historical Provider, MD  RABEprazole (ACIPHEX) 20 MG tablet Take 1 tablet (20 mg total) by mouth daily. 11/15/12  Yes  Nira Retort, NP  simethicone (GAS-X) 80 MG chewable tablet Chew 80 mg by mouth every 6 (six) hours as needed for flatulence.   Yes Historical Provider, MD  dexlansoprazole (DEXILANT) 60 MG capsule Take 1 capsule (60 mg total) by mouth daily. 11/02/12   Corbin Ade, MD  polyethylene glycol-electrolytes (TRILYTE) 420 G solution Take 4,000 mLs by mouth as directed. 10/14/12   Corbin Ade, MD    Allergies as of 12/15/2012 - Review Complete 12/15/2012  Allergen Reaction Noted  . Ciprofloxacin Other (See Comments) 10/07/2012  . Xanax (alprazolam) Other (See Comments) 10/07/2012    Family History  Problem Relation Age of Onset  . Colon cancer Neg Hx     History   Social History  . Marital Status: Married    Spouse Name: N/A    Number of Children: N/A  . Years of Education: N/A   Occupational History  . Not on file.   Social History Main Topics  . Smoking status: Former Games developer  . Smokeless tobacco: Not on file  . Alcohol Use: No  . Drug Use: No  . Sexually Active: Not on file   Other Topics Concern  . Not on file   Social History Narrative  . No narrative on file    Review of Systems: See HPI, otherwise negative ROS  Physical Exam: BP 168/70  Pulse 71  Temp(Src) 97.5 F (36.4 C) (Oral)  Ht 6\' 2"  (1.88 m)  Wt 244 lb 6.4 oz (110.859 kg)  BMI 31.37 kg/m2 General:   Alert,  Well-developed, well-nourished, obese, pleasant and cooperative in NAD Skin:  Intact without significant lesions or rashes. Eyes:  Sclera clear, no icterus.   Conjunctiva pink. Ears:  Normal auditory acuity. Nose:  No deformity, discharge,  or lesions. Mouth:  No deformity or lesions. Neck:  Supple; no masses or thyromegaly. No significant cervical adenopathy. Lungs:  Clear throughout to auscultation.   No wheezes, crackles, or rhonchi. No acute distress. Heart:  Regular rate and rhythm; no murmurs, clicks, rubs,  or gallops. Abdomen: Obese. Positive bowel sounds. Soft entirely nontender  without appreciable mass or organomegaly  Pulses:  Normal pulses noted. Extremities:  Without clubbing or edema.  Impression/Plan:  72 year old gentleman with rather nonspecific symptoms of gas bloating and vague abdominal discomfort. Paucity of actual nausea or vomiting. No significant change in bowel function. No real alarm symptoms. I feel it is somewhat reassuring as to his negative workup thus far. I do feel that this nice gentleman is somewhat preoccupied with his bowel symptoms.  He likely does have an element of GERD.  He is far overdue for his first ever screening colonoscopy   Recommendations:  Continue AcipHex 20 mg daily. Continue low-dose lorazepam. I'll add Carafate suspension 1 g 4 times a day to his regimen. I recommended to both he and his wife that he undergo his first ever screening colonoscopy in the near future.The risks, benefits, limitations, alternatives and imponderables have been reviewed with the patient.  Questions have been answered. All parties are agreeable.   Patient needs a renal ultrasound in 6 months per radiologist recommendations.

## 2012-12-24 ENCOUNTER — Encounter: Payer: Self-pay | Admitting: Internal Medicine

## 2012-12-27 ENCOUNTER — Encounter (HOSPITAL_COMMUNITY): Payer: Self-pay | Admitting: Internal Medicine

## 2013-02-02 ENCOUNTER — Telehealth: Payer: Self-pay | Admitting: Internal Medicine

## 2013-02-02 NOTE — Telephone Encounter (Signed)
Pt's wife called to say that he has been constipated 3 or 4 days now and wanted to know what he could take or what he needed to do. Please advise if he needs to be brought in for an OV or if RMR can call him something in. [phone]

## 2013-02-03 NOTE — Telephone Encounter (Signed)
If MiraLax failed in senna is working for him he may continue that preparation

## 2013-02-03 NOTE — Telephone Encounter (Signed)
Spoke with pts wife- she said he has stopped taking the miralax because it was making his stomach hurt and burn. Pt didn't have BM for several days. He drank a half a bottle of mag citrate last night and had a good BM this morning. Pt also went to [facility] and got a box of docusate sodium with senna in it and is going to start taking them today. Informed them that I would let RMR know and if he has any further recommendations I will let them know.

## 2013-02-04 ENCOUNTER — Emergency Department (HOSPITAL_COMMUNITY)
Admission: EM | Admit: 2013-02-04 | Discharge: 2013-02-04 | Disposition: A | Discharge status: Home / Self Care | Payer: Medicare Other | Attending: Emergency Medicine | Admitting: Emergency Medicine

## 2013-02-04 ENCOUNTER — Encounter (HOSPITAL_COMMUNITY): Payer: Self-pay | Admitting: *Deleted

## 2013-02-04 DIAGNOSIS — Z794 Long term (current) use of insulin: Secondary | ICD-10-CM | POA: Insufficient documentation

## 2013-02-04 DIAGNOSIS — I10 Essential (primary) hypertension: Secondary | ICD-10-CM | POA: Insufficient documentation

## 2013-02-04 DIAGNOSIS — I252 Old myocardial infarction: Secondary | ICD-10-CM | POA: Insufficient documentation

## 2013-02-04 DIAGNOSIS — E785 Hyperlipidemia, unspecified: Secondary | ICD-10-CM | POA: Insufficient documentation

## 2013-02-04 DIAGNOSIS — R739 Hyperglycemia, unspecified: Secondary | ICD-10-CM

## 2013-02-04 DIAGNOSIS — E1169 Type 2 diabetes mellitus with other specified complication: Secondary | ICD-10-CM | POA: Insufficient documentation

## 2013-02-04 DIAGNOSIS — Z7982 Long term (current) use of aspirin: Secondary | ICD-10-CM | POA: Insufficient documentation

## 2013-02-04 DIAGNOSIS — Z79899 Other long term (current) drug therapy: Secondary | ICD-10-CM | POA: Insufficient documentation

## 2013-02-04 DIAGNOSIS — Z8679 Personal history of other diseases of the circulatory system: Secondary | ICD-10-CM | POA: Insufficient documentation

## 2013-02-04 HISTORY — DX: Acute myocardial infarction, unspecified: I21.9

## 2013-02-04 LAB — CBC WITH DIFFERENTIAL/PLATELET
Basophils Absolute: 0 10*3/uL (ref 0.0–0.1)
Basophils Relative: 0 % (ref 0–1)
Eosinophils Absolute: 0 10*3/uL (ref 0.0–0.7)
Eosinophils Relative: 1 % (ref 0–5)
Lymphs Abs: 1.2 10*3/uL (ref 0.7–4.0)
MCH: 33.8 pg (ref 26.0–34.0)
MCV: 102 fL — ABNORMAL HIGH (ref 78.0–100.0)
Neutrophils Relative %: 64 % (ref 43–77)
Platelets: 195 10*3/uL (ref 150–400)
RBC: 3.94 MIL/uL — ABNORMAL LOW (ref 4.22–5.81)
RDW: 12.3 % (ref 11.5–15.5)

## 2013-02-04 LAB — URINE MICROSCOPIC-ADD ON

## 2013-02-04 LAB — BASIC METABOLIC PANEL
Calcium: 9 mg/dL (ref 8.4–10.5)
GFR calc Af Amer: 41 mL/min — ABNORMAL LOW (ref >90)
GFR calc non Af Amer: 36 mL/min — ABNORMAL LOW (ref >90)
Glucose, Bld: 592 mg/dL (ref 70–99)
Potassium: 4.5 mEq/L (ref 3.5–5.1)
Sodium: 132 mEq/L — ABNORMAL LOW (ref 135–145)

## 2013-02-04 LAB — URINALYSIS, ROUTINE W REFLEX MICROSCOPIC
Leukocytes, UA: NEGATIVE
Nitrite: NEGATIVE
Protein, ur: 30 mg/dL — AB
Specific Gravity, Urine: 1.01 (ref 1.005–1.030)
Urobilinogen, UA: 0.2 mg/dL (ref 0.0–1.0)

## 2013-02-04 LAB — GLUCOSE, CAPILLARY: Glucose-Capillary: 547 mg/dL — ABNORMAL HIGH (ref 70–99)

## 2013-02-04 MED ORDER — SODIUM CHLORIDE 0.9 % IV SOLN: INTRAVENOUS | Status: DC

## 2013-02-04 MED ORDER — SODIUM CHLORIDE 0.9 % IV BOLUS (SEPSIS)
1000.0000 mL | Freq: Once | INTRAVENOUS | Status: AC
  Administered 2013-02-04: 1000 mL via INTRAVENOUS

## 2013-02-04 NOTE — ED Notes (Signed)
Patient voided urine. Sample sent to lab as ordered.

## 2013-02-04 NOTE — ED Notes (Signed)
BS has been 575,dizzy, and "disoriented" at home.  Had switched from N and R insulin and to Lantus. Yesterday.Took N and R today since elevated BS today

## 2013-02-04 NOTE — ED Provider Notes (Signed)
History     CSN: [id number]  Arrival date & time 02/04/13  4696   First MD Initiated Contact with Patient 02/04/13 1940      Chief Complaint  Patient presents with  . Hyperglycemia    (Consider location/radiation/quality/duration/timing/severity/associated sxs/prior treatment) HPI Comments: Chris [name] is a 72 y.o. Male who complains of hyperglycemia and confusion. He felt like he had trouble understanding what people were saying, when they were talking to him, tonight. He was changed to a new form of insulin, yesterday. He started taking Lantus 30 units yesterday morning, and stopped taking his usual Novolin N and and insulin regular, yesterday. Today, he talked 36 units of Lantus, because his morning glucose was 396. Today, he checked his glucose, just prior to coming in. It was 575 at that time. So he took insulin and 5 units and insulin R2 units. He denies fever, chills, nausea, vomiting, weakness, or dizziness. There are no known modifying factors.  The history is provided by the patient.    Past Medical History  Diagnosis Date  . 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 Diabetes mellitus   . Macrocytosis     normal B12 & folate levels  . Ischemic cardiomyopathy     EF of 45-50% - posterior & inferior walls are severly hypokinetic  . Atrial fibrillation     not candidate for chronic anticoagulation with Coumadin given chrronic epistaxis as well as hemorrhagic conversion of his acute cva   . Hyperlipidemia   . Hypertension   . Complication of anesthesia   . PONV (postoperative nausea and vomiting)   . Acute renal insufficiency     resolved  . Myocardial infarct     Past Surgical History  Procedure Laterality Date  . Coronary artery bypass graft    . Tonsillectomy    . Esophagogastroduodenoscopy (egd) with esophageal dilation N/A 10/27/2012    Dr. Jena Gauss: distal esophageal diverticulum, non-complete  Schatzki's ring s/p dilation, distal esophageal nodule with benign path, negative Barrett's  . Cholecystectomy  2008  . Colonoscopy N/A 12/23/2012    Procedure: COLONOSCOPY;  Surgeon: Corbin Ade, MD;  Location: [facility];  Service: Endoscopy;  Laterality: N/A;  9:45  . Cardiac surgery      Family History  Problem Relation Age of Onset  . Colon cancer Neg Hx   . Colon polyps Sister   . Colon polyps Sister     History  Substance Use Topics  . Smoking status: Never Smoker   . Smokeless tobacco: Not on file  . Alcohol Use: No      Review of Systems  All other systems reviewed and are negative.    Allergies  Ciprofloxacin and Xanax  Home Medications   Current Outpatient Rx  Name  Route  Sig  Dispense  Refill  . aspirin EC 81 MG tablet   Oral   Take 81 mg by mouth daily.         . Cholecalciferol (VITAMIN D-3) 5000 UNITS TABS   Oral   Take 5,000 Units by mouth daily.          . finasteride (PROSCAR) 5 MG tablet   Oral   Take 5 mg by mouth daily.         . furosemide (LASIX) 40 MG tablet   Oral   Take 40 mg by mouth 2 (two) times daily as needed (Swelling).          Marland Kitchen  insulin glargine (LANTUS) 100 UNIT/ML injection   Subcutaneous   Inject 30-36 Units into the skin daily.         . insulin NPH (HUMULIN N,NOVOLIN N) 100 UNIT/ML injection   Subcutaneous   Inject 5 Units into the skin once.          . insulin regular (NOVOLIN R,HUMULIN R) 100 units/mL injection   Subcutaneous   Inject 2 Units into the skin once.         Marland Kitchen LORazepam (ATIVAN) 0.5 MG tablet   Oral   Take 0.5 mg by mouth 2 (two) times daily as needed for anxiety.         . lovastatin (MEVACOR) 40 MG tablet   Oral   Take 40 mg by mouth at bedtime.           . magnesium oxide (MAG-OX) 400 MG tablet   Oral   Take 400 mg by mouth daily.         . metoprolol (LOPRESSOR) 50 MG tablet   Oral   Take 50 mg by mouth 2 (two) times daily.           . ondansetron (ZOFRAN)  4 MG tablet   Oral   Take 4 mg by mouth daily as needed for nausea.         . polyethylene glycol powder (GLYCOLAX/MIRALAX) powder   Oral   Take 17 g by mouth daily as needed. For constipation.         . RABEprazole (ACIPHEX) 20 MG tablet   Oral   Take 20 mg by mouth every morning.           BP 162/70  Pulse 84  Temp(Src) 97.1 F (36.2 C) (Oral)  Resp 18  Ht 6\' 3"  (1.905 m)  Wt 233 lb (105.688 kg)  BMI 29.12 kg/m2  SpO2 97%  Physical Exam  Nursing note and vitals reviewed. Constitutional: He is oriented to person, place, and time. He appears well-developed.  Elderly, frail  HENT:  Head: Normocephalic and atraumatic.  Right Ear: External ear normal.  Left Ear: External ear normal.  Eyes: Conjunctivae and EOM are normal. Pupils are equal, round, and reactive to light.  Neck: Normal range of motion and phonation normal. Neck supple.  Cardiovascular: Normal rate, regular rhythm, normal heart sounds and intact distal pulses.   Pulmonary/Chest: Effort normal and breath sounds normal. He exhibits no bony tenderness.  Abdominal: Soft. Normal appearance. There is no tenderness.  Musculoskeletal: Normal range of motion.  Healing ulcer, left medial ankle. No associated redness, drainage or fluctuance.  Neurological: He is alert and oriented to person, place, and time. He has normal strength. No cranial nerve deficit or sensory deficit. He exhibits normal muscle tone. Coordination normal.  No dysarthria or aphasia. Memory is intact.  Skin: Skin is warm, dry and intact.  Psychiatric: He has a normal mood and affect. His behavior is normal. Judgment and thought content normal.    ED Course  Procedures (including critical care time)   Medications  0.9 %  sodium chloride infusion (not administered)  sodium chloride 0.9 % bolus 1,000 mL (0 mLs Intravenous Stopped 02/04/13 2125)  sodium chloride 0.9 % bolus 1,000 mL (1,000 mLs Intravenous New Bag/Given 02/04/13 2108)     Patient Vitals for the past 24 hrs:  BP Temp Temp src Pulse Resp SpO2 Height Weight  02/04/13 1924 162/70 mmHg 97.1 F (36.2 C) Oral 84 18 97 % 6\' 3"  (1.905 m)  233 lb (105.688 kg)    10:33 PM Reevaluation with update and discussion. After initial assessment and treatment, an updated evaluation reveals he feels better after IV fluids. CBG improved to 458. He has a sliding scale, to use, with regular insulin.Mancel Bale L    Labs Reviewed  GLUCOSE, CAPILLARY - Abnormal; Notable for the following:    Glucose-Capillary 547 (*)    All other components within normal limits  CBC WITH DIFFERENTIAL - Abnormal; Notable for the following:    RBC 3.94 (*)    MCV 102.0 (*)    All other components within normal limits  BASIC METABOLIC PANEL - Abnormal; Notable for the following:    Sodium 132 (*)    Chloride 94 (*)    Glucose, Bld 592 (*)    BUN 32 (*)    Creatinine, Ser 1.80 (*)    GFR calc non Af Amer 36 (*)    GFR calc Af Amer 41 (*)    All other components within normal limits  URINALYSIS, ROUTINE W REFLEX MICROSCOPIC - Abnormal; Notable for the following:    Glucose, UA >1000 (*)    Hgb urine dipstick SMALL (*)    Protein, ur 30 (*)    All other components within normal limits  GLUCOSE, CAPILLARY - Abnormal; Notable for the following:    Glucose-Capillary 458 (*)    All other components within normal limits  URINE CULTURE  URINE MICROSCOPIC-ADD ON      1. Hyperglycemia       MDM  Hyperglycemia associated with a change in his insulin treatment. Doubt metabolic instability, ketosis or significant infection. He, stable for discharge with outpatient management.   Nursing Notes Reviewed/ Care Coordinated, and agree without changes. Applicable Imaging Reviewed.  Interpretation of Laboratory Data incorporated into ED treatment   Plan: Home Medications- sliding scale insulin; Home Treatments- oral hydration; Recommended follow up- PCP, in 4 days, as  scheduled          Flint Melter, MD 02/04/13 2234

## 2013-02-04 NOTE — ED Notes (Signed)
CRITICAL VALUE ALERT  Critical value received:  Glucose 592  Date of notification:  02/04/13  Time of notification:  2048  Critical value read back:yes  Nurse who received alert:  Santiago Bur, RN  MD notified (1st page):    Time of first page:    MD notified (2nd page):  Time of second page:  Responding MD:  Dr. Gray Bernhardt  Time MD responded:  2048

## 2013-02-08 LAB — URINE CULTURE

## 2013-02-09 ENCOUNTER — Telehealth: Payer: Self-pay | Admitting: Internal Medicine

## 2013-02-09 NOTE — Progress Notes (Signed)
ED Antimicrobial Stewardship Positive Culture Follow Up   Chris [name] is an 72 y.o. male who presented to [facility] on 02/04/2013 with a chief complaint of dizziness/disoriented d/t hyperglycemia after switching insulin formulations  Chief Complaint  Patient presents with  . Hyperglycemia    Recent Results (from the past 720 hour(s))  URINE CULTURE     Status: None   Collection Time    02/04/13  9:08 PM      Result Value Range Status   Specimen Description URINE, CLEAN CATCH   Final   Special Requests NONE   Final   Culture  Setup Time 02/04/2013 21:30   Final   Colony Count 35,000 COLONIES/ML   Final   Culture ENTEROCOCCUS SPECIES   Final   Report Status 02/08/2013 FINAL   Final   Organism ID, Bacteria ENTEROCOCCUS SPECIES   Final    []  Treated with , organism resistant to prescribed antimicrobial []  Patient discharged originally without antimicrobial agent and treatment is now indicated  The patient was asymptomatic of a UTI with a small colony count of 35k -- would not need treatment.  ED Provider: Elpidio Anis, PA-C  [name] 02/09/2013, 1:01 PM Infectious Diseases Pharmacist Phone# [phone]

## 2013-02-09 NOTE — Telephone Encounter (Signed)
Wife called concerned about why her husband is having problems with constipation ever since having this tcs. She said it has gotten worse since having procedure. Please advise and call her [phone]

## 2013-02-09 NOTE — Telephone Encounter (Signed)
Tried to call pt- NA 

## 2013-02-09 NOTE — ED Notes (Signed)
Post ED Visit - Positive Culture Follow-up  Culture report reviewed by antimicrobial stewardship pharmacist: []  Wes Dulaney, Pharm.D., BCPS []  Celedonio Miyamoto, Pharm.D., BCPS [x]  Georgina Pillion, Pharm.D., BCPS []  [place], [address].D., BCPS, AAHIVP []  Estella Husk, Pharm.D., BCPS, AAHIV  Positive urine culture  no further patient follow-up is required at this time.  Larena Sox 02/09/2013, 4:13 PM

## 2013-02-10 ENCOUNTER — Emergency Department (HOSPITAL_COMMUNITY): Payer: Medicare Other

## 2013-02-10 ENCOUNTER — Emergency Department (HOSPITAL_COMMUNITY)
Admission: EM | Admit: 2013-02-10 | Discharge: 2013-02-10 | Disposition: A | Discharge status: Home / Self Care | Payer: Medicare Other | Attending: Emergency Medicine | Admitting: Emergency Medicine

## 2013-02-10 DIAGNOSIS — R300 Dysuria: Secondary | ICD-10-CM | POA: Insufficient documentation

## 2013-02-10 DIAGNOSIS — Z8719 Personal history of other diseases of the digestive system: Secondary | ICD-10-CM | POA: Insufficient documentation

## 2013-02-10 DIAGNOSIS — Z794 Long term (current) use of insulin: Secondary | ICD-10-CM | POA: Insufficient documentation

## 2013-02-10 DIAGNOSIS — Z862 Personal history of diseases of the blood and blood-forming organs and certain disorders involving the immune mechanism: Secondary | ICD-10-CM | POA: Insufficient documentation

## 2013-02-10 DIAGNOSIS — R142 Eructation: Secondary | ICD-10-CM | POA: Insufficient documentation

## 2013-02-10 DIAGNOSIS — R141 Gas pain: Secondary | ICD-10-CM | POA: Insufficient documentation

## 2013-02-10 DIAGNOSIS — Z87448 Personal history of other diseases of urinary system: Secondary | ICD-10-CM | POA: Insufficient documentation

## 2013-02-10 DIAGNOSIS — R143 Flatulence: Secondary | ICD-10-CM | POA: Insufficient documentation

## 2013-02-10 DIAGNOSIS — Z7982 Long term (current) use of aspirin: Secondary | ICD-10-CM | POA: Insufficient documentation

## 2013-02-10 DIAGNOSIS — Z951 Presence of aortocoronary bypass graft: Secondary | ICD-10-CM | POA: Insufficient documentation

## 2013-02-10 DIAGNOSIS — E119 Type 2 diabetes mellitus without complications: Secondary | ICD-10-CM | POA: Insufficient documentation

## 2013-02-10 DIAGNOSIS — I252 Old myocardial infarction: Secondary | ICD-10-CM | POA: Insufficient documentation

## 2013-02-10 DIAGNOSIS — Z8679 Personal history of other diseases of the circulatory system: Secondary | ICD-10-CM | POA: Insufficient documentation

## 2013-02-10 DIAGNOSIS — I10 Essential (primary) hypertension: Secondary | ICD-10-CM | POA: Insufficient documentation

## 2013-02-10 DIAGNOSIS — K5909 Other constipation: Secondary | ICD-10-CM

## 2013-02-10 DIAGNOSIS — Z9089 Acquired absence of other organs: Secondary | ICD-10-CM | POA: Insufficient documentation

## 2013-02-10 DIAGNOSIS — E785 Hyperlipidemia, unspecified: Secondary | ICD-10-CM | POA: Insufficient documentation

## 2013-02-10 DIAGNOSIS — Z9889 Other specified postprocedural states: Secondary | ICD-10-CM | POA: Insufficient documentation

## 2013-02-10 DIAGNOSIS — K5900 Constipation, unspecified: Secondary | ICD-10-CM | POA: Insufficient documentation

## 2013-02-10 DIAGNOSIS — Z79899 Other long term (current) drug therapy: Secondary | ICD-10-CM | POA: Insufficient documentation

## 2013-02-10 DIAGNOSIS — Z8673 Personal history of transient ischemic attack (TIA), and cerebral infarction without residual deficits: Secondary | ICD-10-CM | POA: Insufficient documentation

## 2013-02-10 DIAGNOSIS — I4891 Unspecified atrial fibrillation: Secondary | ICD-10-CM | POA: Insufficient documentation

## 2013-02-10 DIAGNOSIS — R110 Nausea: Secondary | ICD-10-CM | POA: Insufficient documentation

## 2013-02-10 LAB — URINALYSIS, ROUTINE W REFLEX MICROSCOPIC
Glucose, UA: NEGATIVE mg/dL
Ketones, ur: NEGATIVE mg/dL
Leukocytes, UA: NEGATIVE
pH: 6 (ref 5.0–8.0)

## 2013-02-10 LAB — GLUCOSE, CAPILLARY: Glucose-Capillary: 210 mg/dL — ABNORMAL HIGH (ref 70–99)

## 2013-02-10 NOTE — Telephone Encounter (Signed)
Tried to call pt- LMOM 

## 2013-02-10 NOTE — ED Notes (Signed)
Patient states he does not need anything at this time. 

## 2013-02-10 NOTE — ED Notes (Signed)
Pt witnessed walking out of room, wife stated that he needs his niacin, and he gets restless and has decided to leave. Declined to sign ama forms. Offered the pt a wheelchair to car. Declined. Instructed to return to ed if he changes his mind or conditions worsens.

## 2013-02-10 NOTE — ED Notes (Signed)
States that he has been having trouble urinating and trouble having bowel movements since his colonoscopy in April 2014.  States his last regular bowel movement was about 2 days ago.  States that he is feeling constipated.  States he is having right upper abdominal pain and mid lower abdominal pain.

## 2013-02-10 NOTE — ED Notes (Signed)
Pt reports that he has been having trouble w/ constipation since his colonoscopy/endoscopy in early April.  They report attempting to contact the office w/ no success. Today having generalized ab pain. Denies any nausea or vomiting. Pt is reading from his discharge papers and reports having several of the symptoms. (gas, bloating and constiaption). He was seen in the ed for high blood sugar last week.

## 2013-02-10 NOTE — ED Provider Notes (Signed)
History    This chart was scribed for Chris B. Bernette Mayers, MD by Quintella Reichert, ED scribe.  This patient was seen in room [facility]03/[facility]03 and the patient's care was started at 9:00 AM.   CSN: [id number]  Arrival date & time 02/10/13  0843      Chief Complaint  Patient presents with  . Dysuria  . Abdominal Pain     The history is provided by the patient and the spouse. No language interpreter was used.    HPI Comments: Chris [name] is a 72 y.o. male who presents to the Emergency Department complaining of constipation that began 1 month ago subsequent to a colonoscopy, with accompanying difficulty urinating, bloating, mild nausea and mild abdominal pain.  Pt reports that he has been having infrequent BMs since the colonoscopy, with his last BM being 2 days ago.  He states that when he attempts to move his bowels he "can't do that and I can't pee."   He reports that nausea and abdominal pain are relieved by having a BM.   Pt states that he has not yet consulted with a urologist in regard to his present symptoms, and came to the ED today because he is not able to schedule a urology appointment until June 21.  He expresses concern that his symptoms may be due to prostate swelling.  Pt denies emesis, fever, or any other associated symptoms.   Pt was prescribed Miralax but states that he has not been taking it because he does not believe it was effective and he states it caused dehydration.  He reports he then attempted to treat symptoms with an OTC medication.  Pt has h/o DM and reports a BG of 114 this morning.   Past Medical History  Diagnosis Date  . 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 Diabetes mellitus   . Macrocytosis     normal B12 & folate levels  . Ischemic cardiomyopathy     EF of 45-50% - posterior & inferior walls are severly hypokinetic  . Atrial fibrillation     not candidate for chronic  anticoagulation with Coumadin given chrronic epistaxis as well as hemorrhagic conversion of his acute cva   . Hyperlipidemia   . Hypertension   . Complication of anesthesia   . PONV (postoperative nausea and vomiting)   . Acute renal insufficiency     resolved  . Myocardial infarct     Past Surgical History  Procedure Laterality Date  . Coronary artery bypass graft    . Tonsillectomy    . Esophagogastroduodenoscopy (egd) with esophageal dilation N/A 10/27/2012    Dr. Jena Gauss: distal esophageal diverticulum, non-complete Schatzki's ring s/p dilation, distal esophageal nodule with benign path, negative Barrett's  . Cholecystectomy  2008  . Colonoscopy N/A 12/23/2012    Procedure: COLONOSCOPY;  Surgeon: Corbin Ade, MD;  Location: [facility];  Service: Endoscopy;  Laterality: N/A;  9:45  . Cardiac surgery      Family History  Problem Relation Age of Onset  . Colon cancer Neg Hx   . Colon polyps Sister   . Colon polyps Sister     History  Substance Use Topics  . Smoking status: Never Smoker   . Smokeless tobacco: Not on file  . Alcohol Use: No      Review of Systems A complete 10 system review of systems was obtained and all systems are negative except as noted  in the HPI and PMH.    Allergies  Ciprofloxacin and Xanax  Home Medications   Current Outpatient Rx  Name  Route  Sig  Dispense  Refill  . aspirin EC 81 MG tablet   Oral   Take 81 mg by mouth daily.         . Cholecalciferol (VITAMIN D-3) 5000 UNITS TABS   Oral   Take 5,000 Units by mouth daily.          . finasteride (PROSCAR) 5 MG tablet   Oral   Take 5 mg by mouth daily.         . furosemide (LASIX) 40 MG tablet   Oral   Take 40 mg by mouth 2 (two) times daily as needed (Swelling).          . insulin glargine (LANTUS) 100 UNIT/ML injection   Subcutaneous   Inject 30-36 Units into the skin daily.         . insulin NPH (HUMULIN N,NOVOLIN N) 100 UNIT/ML injection   Subcutaneous    Inject 5 Units into the skin once.          . insulin regular (NOVOLIN R,HUMULIN R) 100 units/mL injection   Subcutaneous   Inject 2 Units into the skin once.         Marland Kitchen LORazepam (ATIVAN) 0.5 MG tablet   Oral   Take 0.5 mg by mouth 2 (two) times daily as needed for anxiety.         . lovastatin (MEVACOR) 40 MG tablet   Oral   Take 40 mg by mouth at bedtime.           . magnesium oxide (MAG-OX) 400 MG tablet   Oral   Take 400 mg by mouth daily.         . metoprolol (LOPRESSOR) 50 MG tablet   Oral   Take 50 mg by mouth 2 (two) times daily.           . ondansetron (ZOFRAN) 4 MG tablet   Oral   Take 4 mg by mouth daily as needed for nausea.         . polyethylene glycol powder (GLYCOLAX/MIRALAX) powder   Oral   Take 17 g by mouth daily as needed. For constipation.         . RABEprazole (ACIPHEX) 20 MG tablet   Oral   Take 20 mg by mouth every morning.           BP 174/72  Pulse 54  Temp(Src) 97.4 F (36.3 C) (Oral)  Resp 16  Ht 6\' 3"  (1.905 m)  Wt 240 lb (108.863 kg)  BMI 30 kg/m2  SpO2 100%  Physical Exam  Nursing note and vitals reviewed. Constitutional: He is oriented to person, place, and time. He appears well-developed and well-nourished.  HENT:  Head: Normocephalic and atraumatic.  Eyes: EOM are normal. Pupils are equal, round, and reactive to light.  Neck: Normal range of motion. Neck supple.  Cardiovascular: Normal rate, normal heart sounds and intact distal pulses.   Pulmonary/Chest: Effort normal and breath sounds normal.  Abdominal: Soft. Bowel sounds are normal. He exhibits no distension and no mass. There is no tenderness. There is no rebound and no guarding.  Musculoskeletal: Normal range of motion. He exhibits no edema and no tenderness.  Neurological: He is alert and oriented to person, place, and time. He has normal strength. No cranial nerve deficit or sensory deficit.  Skin: Skin is  warm and dry. No rash noted.  Psychiatric:  He has a normal mood and affect.    ED Course  Procedures (including critical care time)  DIAGNOSTIC STUDIES: Oxygen Saturation is 100% on room air, normal by my interpretation.    COORDINATION OF CARE: 9:08 AM-Discussed treatment plan which includes imaging and labs with pt at bedside and pt agreed to plan.      Labs Reviewed  URINALYSIS, ROUTINE W REFLEX MICROSCOPIC - Abnormal; Notable for the following:    Specific Gravity, Urine <1.005 (*)    Hgb urine dipstick SMALL (*)    Protein, ur TRACE (*)    All other components within normal limits  GLUCOSE, CAPILLARY - Abnormal; Notable for the following:    Glucose-Capillary 210 (*)    All other components within normal limits  URINE MICROSCOPIC-ADD ON   Dg Abd Acute W/chest  02/10/2013   *RADIOLOGY REPORT*  Clinical Data: Right-sided mid and lower abdominal pain over the past 2 weeks.  Nausea.  Constipation.  Colonoscopy 12/23/2012 with polyp removal.  ACUTE ABDOMEN SERIES (ABDOMEN 2 VIEW & CHEST 1 VIEW)  Comparison: CT abdomen and pelvis 11/23/2012, 10/12/2012.  Acute abdomen series 11/07/2012, 10/21/2012.  Findings: Bowel gas pattern unremarkable without evidence of obstruction or significant ileus.  No evidence of free air or significant air fluid levels on the erect image.  Large stool burden throughout the colon from cecum to rectum.  Calcification of the vasa deferens.  No visible opaque urinary tract calculi. Degenerative changes involving the lumbar spine.  Prior sternotomy for CABG.  Cardiac silhouette enlarged but stable. Interval development of a moderate-sized right pleural effusion with associated consolidation in the right lower lobe.  Mild linear atelectasis in the left base.  Pulmonary vascularity normal without evidence of pulmonary edema.  IMPRESSION:  1.  No acute abdominal abnormality.  Large stool burden. 2.  Stable cardiomegaly.  Moderate sized right pleural effusion and associated passive atelectasis in the right lower  lobe.  Mild left lower lobe atelectasis.   Original Report Authenticated By: Hulan Saas, M.D.     No diagnosis found.    MDM  UA unremarkable, AAS as above, no respiratory symptoms to correlate to effusion seen. Pt apparently has eloped prior to getting these results.       I personally performed the services described in this documentation, which was scribed in my presence. The recorded information has been reviewed and is accurate.     Chris B. Bernette Mayers, MD 02/10/13 1102

## 2013-02-15 ENCOUNTER — Encounter: Payer: Self-pay | Admitting: General Practice

## 2013-02-15 NOTE — Telephone Encounter (Signed)
Called pts wife, informed her that I had been trying to get in touch with her. She said someone had stolen her husbands medicine and he had been constipated. They did take him to the ED and they were able to speak with the pharmacy and get his medicine refilled. She stated he was feeling much better now.

## 2013-03-11 ENCOUNTER — Ambulatory Visit: Payer: Medicare Other | Admitting: Urology

## 2013-05-05 ENCOUNTER — Encounter: Payer: Self-pay | Admitting: Internal Medicine

## 2013-05-05 ENCOUNTER — Telehealth: Payer: Self-pay | Admitting: Internal Medicine

## 2013-05-05 NOTE — Telephone Encounter (Signed)
Letter has been mailed to the patient 

## 2013-05-05 NOTE — Telephone Encounter (Signed)
Message copied by Glendora Score on Thu May 05, 2013  4:01 PM ------      Message from: Tonye Pearson      Created: Thu May 05, 2013  3:32 PM       Pre recall list need renal U/S                  Thanks      Ginger       ------

## 2013-05-13 ENCOUNTER — Ambulatory Visit (INDEPENDENT_AMBULATORY_CARE_PROVIDER_SITE_OTHER): Payer: Medicare Other | Admitting: Urology

## 2013-05-13 DIAGNOSIS — N401 Enlarged prostate with lower urinary tract symptoms: Secondary | ICD-10-CM

## 2013-05-13 DIAGNOSIS — N138 Other obstructive and reflux uropathy: Secondary | ICD-10-CM

## 2013-07-15 ENCOUNTER — Ambulatory Visit (INDEPENDENT_AMBULATORY_CARE_PROVIDER_SITE_OTHER): Payer: Medicare Other | Admitting: Urology

## 2013-07-15 DIAGNOSIS — N138 Other obstructive and reflux uropathy: Secondary | ICD-10-CM

## 2013-07-15 DIAGNOSIS — R339 Retention of urine, unspecified: Secondary | ICD-10-CM

## 2013-07-15 DIAGNOSIS — N401 Enlarged prostate with lower urinary tract symptoms: Secondary | ICD-10-CM

## 2013-07-23 ENCOUNTER — Emergency Department (HOSPITAL_COMMUNITY): Payer: Medicare Other

## 2013-07-23 ENCOUNTER — Encounter (HOSPITAL_COMMUNITY): Payer: Self-pay | Admitting: Emergency Medicine

## 2013-07-23 ENCOUNTER — Inpatient Hospital Stay (HOSPITAL_COMMUNITY)
Admission: EM | Admit: 2013-07-23 | Discharge: 2013-07-27 | DRG: 194 | Disposition: A | Discharge status: Home / Self Care | Payer: Medicare Other | Attending: Internal Medicine | Admitting: Internal Medicine

## 2013-07-23 DIAGNOSIS — R109 Unspecified abdominal pain: Secondary | ICD-10-CM

## 2013-07-23 DIAGNOSIS — I482 Chronic atrial fibrillation, unspecified: Secondary | ICD-10-CM | POA: Diagnosis present

## 2013-07-23 DIAGNOSIS — I10 Essential (primary) hypertension: Secondary | ICD-10-CM | POA: Diagnosis present

## 2013-07-23 DIAGNOSIS — Z794 Long term (current) use of insulin: Secondary | ICD-10-CM

## 2013-07-23 DIAGNOSIS — K5900 Constipation, unspecified: Secondary | ICD-10-CM

## 2013-07-23 DIAGNOSIS — I252 Old myocardial infarction: Secondary | ICD-10-CM

## 2013-07-23 DIAGNOSIS — I639 Cerebral infarction, unspecified: Secondary | ICD-10-CM | POA: Diagnosis present

## 2013-07-23 DIAGNOSIS — I2589 Other forms of chronic ischemic heart disease: Secondary | ICD-10-CM | POA: Diagnosis present

## 2013-07-23 DIAGNOSIS — K219 Gastro-esophageal reflux disease without esophagitis: Secondary | ICD-10-CM | POA: Diagnosis present

## 2013-07-23 DIAGNOSIS — E119 Type 2 diabetes mellitus without complications: Secondary | ICD-10-CM | POA: Diagnosis present

## 2013-07-23 DIAGNOSIS — Z79899 Other long term (current) drug therapy: Secondary | ICD-10-CM

## 2013-07-23 DIAGNOSIS — I4891 Unspecified atrial fibrillation: Secondary | ICD-10-CM

## 2013-07-23 DIAGNOSIS — I509 Heart failure, unspecified: Secondary | ICD-10-CM | POA: Diagnosis present

## 2013-07-23 DIAGNOSIS — Z951 Presence of aortocoronary bypass graft: Secondary | ICD-10-CM

## 2013-07-23 DIAGNOSIS — J90 Pleural effusion, not elsewhere classified: Secondary | ICD-10-CM

## 2013-07-23 DIAGNOSIS — E785 Hyperlipidemia, unspecified: Secondary | ICD-10-CM | POA: Diagnosis present

## 2013-07-23 DIAGNOSIS — I5022 Chronic systolic (congestive) heart failure: Secondary | ICD-10-CM | POA: Diagnosis present

## 2013-07-23 DIAGNOSIS — J189 Pneumonia, unspecified organism: Principal | ICD-10-CM | POA: Diagnosis present

## 2013-07-23 DIAGNOSIS — E1165 Type 2 diabetes mellitus with hyperglycemia: Secondary | ICD-10-CM | POA: Diagnosis present

## 2013-07-23 DIAGNOSIS — I6350 Cerebral infarction due to unspecified occlusion or stenosis of unspecified cerebral artery: Secondary | ICD-10-CM

## 2013-07-23 DIAGNOSIS — Z8673 Personal history of transient ischemic attack (TIA), and cerebral infarction without residual deficits: Secondary | ICD-10-CM

## 2013-07-23 LAB — CBC WITH DIFFERENTIAL/PLATELET
Basophils Absolute: 0 10*3/uL (ref 0.0–0.1)
HCT: 37.2 % — ABNORMAL LOW (ref 39.0–52.0)
Lymphocytes Relative: 19 % (ref 12–46)
Neutro Abs: 5.4 10*3/uL (ref 1.7–7.7)
Platelets: 209 10*3/uL (ref 150–400)
RDW: 12.4 % (ref 11.5–15.5)
WBC: 7.9 10*3/uL (ref 4.0–10.5)

## 2013-07-23 LAB — GLUCOSE, CAPILLARY
Glucose-Capillary: 61 mg/dL — ABNORMAL LOW (ref 70–99)
Glucose-Capillary: 129 mg/dL — ABNORMAL HIGH (ref 70–99)

## 2013-07-23 LAB — URINALYSIS, ROUTINE W REFLEX MICROSCOPIC
Glucose, UA: NEGATIVE mg/dL
Ketones, ur: NEGATIVE mg/dL
Protein, ur: NEGATIVE mg/dL

## 2013-07-23 LAB — URINE MICROSCOPIC-ADD ON

## 2013-07-23 LAB — COMPREHENSIVE METABOLIC PANEL
ALT: 24 U/L (ref 0–53)
AST: 30 U/L (ref 0–37)
CO2: 33 mEq/L — ABNORMAL HIGH (ref 19–32)
Chloride: 98 mEq/L (ref 96–112)
GFR calc non Af Amer: 36 mL/min — ABNORMAL LOW (ref >90)
Potassium: 4.1 mEq/L (ref 3.5–5.1)
Sodium: 135 mEq/L (ref 135–145)
Total Bilirubin: 1 mg/dL (ref 0.3–1.2)

## 2013-07-23 MED ORDER — LISINOPRIL 10 MG PO TABS: 20.0000 mg | ORAL_TABLET | Freq: Every day | ORAL | Status: DC

## 2013-07-23 MED ORDER — PREDNISOLONE ACETATE 1 % OP SUSP
1.0000 [drp] | Freq: Four times a day (QID) | OPHTHALMIC | Status: DC | PRN
  Filled 2013-07-23: qty 1

## 2013-07-23 MED ORDER — ONDANSETRON HCL 4 MG/2ML IJ SOLN: 4.0000 mg | Freq: Four times a day (QID) | INTRAMUSCULAR | Status: DC | PRN

## 2013-07-23 MED ORDER — AZITHROMYCIN 250 MG PO TABS
500.0000 mg | ORAL_TABLET | ORAL | Status: DC
  Administered 2013-07-24 – 2013-07-27 (×4): 500 mg via ORAL
  Filled 2013-07-23 (×4): qty 2

## 2013-07-23 MED ORDER — ACETAMINOPHEN 325 MG PO TABS: 650.0000 mg | ORAL_TABLET | Freq: Four times a day (QID) | ORAL | Status: DC | PRN

## 2013-07-23 MED ORDER — LORAZEPAM 0.5 MG PO TABS
0.5000 mg | ORAL_TABLET | Freq: Two times a day (BID) | ORAL | Status: DC | PRN
  Administered 2013-07-24 – 2013-07-26 (×3): 0.5 mg via ORAL
  Filled 2013-07-23 (×3): qty 1

## 2013-07-23 MED ORDER — DEXTROSE 5 % IV SOLN
500.0000 mg | Freq: Once | INTRAVENOUS | Status: AC
  Administered 2013-07-23: 500 mg via INTRAVENOUS

## 2013-07-23 MED ORDER — FINASTERIDE 5 MG PO TABS
5.0000 mg | ORAL_TABLET | Freq: Every day | ORAL | Status: DC
  Administered 2013-07-27: 5 mg via ORAL
  Filled 2013-07-23 (×7): qty 1

## 2013-07-23 MED ORDER — ONDANSETRON HCL 4 MG PO TABS
4.0000 mg | ORAL_TABLET | Freq: Four times a day (QID) | ORAL | Status: DC | PRN
  Administered 2013-07-23 – 2013-07-25 (×2): 4 mg via ORAL
  Filled 2013-07-23 (×2): qty 1

## 2013-07-23 MED ORDER — GLUCOSE 40 % PO GEL
ORAL | Status: AC
  Administered 2013-07-23: 15 g via ORAL
  Filled 2013-07-23: qty 1

## 2013-07-23 MED ORDER — GLUCOSE 40 % PO GEL: 1.0000 | Freq: Once | ORAL | Status: AC

## 2013-07-23 MED ORDER — FUROSEMIDE 40 MG PO TABS
40.0000 mg | ORAL_TABLET | Freq: Two times a day (BID) | ORAL | Status: DC | PRN
  Administered 2013-07-25: 40 mg via ORAL
  Filled 2013-07-23: qty 1

## 2013-07-23 MED ORDER — AZITHROMYCIN 250 MG PO TABS: 500.0000 mg | ORAL_TABLET | ORAL | Status: DC

## 2013-07-23 MED ORDER — ASPIRIN EC 81 MG PO TBEC
81.0000 mg | DELAYED_RELEASE_TABLET | Freq: Every day | ORAL | Status: DC
  Administered 2013-07-24 – 2013-07-27 (×3): 81 mg via ORAL
  Filled 2013-07-23 (×4): qty 1

## 2013-07-23 MED ORDER — DEXTROSE 5 % IV SOLN
1.0000 g | Freq: Once | INTRAVENOUS | Status: AC
  Administered 2013-07-23: 1 g via INTRAVENOUS
  Filled 2013-07-23: qty 10

## 2013-07-23 MED ORDER — HYDROMORPHONE HCL PF 1 MG/ML IJ SOLN
1.0000 mg | Freq: Once | INTRAMUSCULAR | Status: DC
  Filled 2013-07-23: qty 1

## 2013-07-23 MED ORDER — FINASTERIDE 5 MG PO TABS
ORAL_TABLET | ORAL | Status: AC
  Filled 2013-07-23: qty 1

## 2013-07-23 MED ORDER — INSULIN ASPART 100 UNIT/ML ~~LOC~~ SOLN: 0.0000 [IU] | Freq: Every day | SUBCUTANEOUS | Status: DC

## 2013-07-23 MED ORDER — POLYETHYLENE GLYCOL 3350 17 G PO PACK: 17.0000 g | PACK | Freq: Every day | ORAL | Status: DC | PRN

## 2013-07-23 MED ORDER — HYDROCODONE-ACETAMINOPHEN 5-325 MG PO TABS: 1.0000 | ORAL_TABLET | ORAL | Status: DC | PRN

## 2013-07-23 MED ORDER — FLEET ENEMA 7-19 GM/118ML RE ENEM: 1.0000 | ENEMA | Freq: Once | RECTAL | Status: AC | PRN

## 2013-07-23 MED ORDER — HEPARIN SODIUM (PORCINE) 5000 UNIT/ML IJ SOLN
5000.0000 [IU] | Freq: Three times a day (TID) | INTRAMUSCULAR | Status: DC
  Administered 2013-07-23 – 2013-07-25 (×7): 5000 [IU] via SUBCUTANEOUS
  Filled 2013-07-23 (×7): qty 1

## 2013-07-23 MED ORDER — ACETAMINOPHEN 650 MG RE SUPP: 650.0000 mg | Freq: Four times a day (QID) | RECTAL | Status: DC | PRN

## 2013-07-23 MED ORDER — INSULIN GLARGINE 100 UNIT/ML ~~LOC~~ SOLN
40.0000 [IU] | Freq: Every day | SUBCUTANEOUS | Status: DC
  Filled 2013-07-23 (×3): qty 0.4

## 2013-07-23 MED ORDER — DEXTROSE 5 % IV SOLN
1.0000 g | INTRAVENOUS | Status: DC
  Administered 2013-07-24 – 2013-07-25 (×2): 1 g via INTRAVENOUS
  Filled 2013-07-23 (×4): qty 10

## 2013-07-23 MED ORDER — SODIUM CHLORIDE 0.9 % IV BOLUS (SEPSIS)
1000.0000 mL | Freq: Once | INTRAVENOUS | Status: AC
  Administered 2013-07-23: 1000 mL via INTRAVENOUS

## 2013-07-23 MED ORDER — SORBITOL 70 % SOLN: 30.0000 mL | Freq: Every day | Status: DC | PRN

## 2013-07-23 MED ORDER — DEXTROSE 5 % IV SOLN
1.0000 g | INTRAVENOUS | Status: DC
  Filled 2013-07-23: qty 10

## 2013-07-23 MED ORDER — INSULIN ASPART 100 UNIT/ML ~~LOC~~ SOLN
0.0000 [IU] | Freq: Three times a day (TID) | SUBCUTANEOUS | Status: DC
  Administered 2013-07-24: 5 [IU] via SUBCUTANEOUS
  Administered 2013-07-24: 3 [IU] via SUBCUTANEOUS
  Administered 2013-07-25: 5 [IU] via SUBCUTANEOUS
  Administered 2013-07-25 (×2): 2 [IU] via SUBCUTANEOUS
  Administered 2013-07-26 (×2): 3 [IU] via SUBCUTANEOUS

## 2013-07-23 MED ORDER — METOPROLOL TARTRATE 50 MG PO TABS
50.0000 mg | ORAL_TABLET | Freq: Two times a day (BID) | ORAL | Status: DC
  Administered 2013-07-23 – 2013-07-27 (×9): 50 mg via ORAL
  Filled 2013-07-23 (×9): qty 1

## 2013-07-23 MED ORDER — ONDANSETRON HCL 4 MG/2ML IJ SOLN
4.0000 mg | Freq: Once | INTRAMUSCULAR | Status: AC
  Administered 2013-07-23: 4 mg via INTRAVENOUS
  Filled 2013-07-23: qty 2

## 2013-07-23 MED ORDER — LISINOPRIL 10 MG PO TABS
20.0000 mg | ORAL_TABLET | Freq: Every day | ORAL | Status: DC
  Administered 2013-07-23 – 2013-07-26 (×4): 20 mg via ORAL
  Filled 2013-07-23 (×4): qty 2

## 2013-07-23 MED ORDER — IOHEXOL 300 MG/ML  SOLN
50.0000 mL | Freq: Once | INTRAMUSCULAR | Status: AC | PRN
  Administered 2013-07-23: 50 mL via ORAL

## 2013-07-23 MED ORDER — SODIUM CHLORIDE 0.9 % IJ SOLN
3.0000 mL | Freq: Two times a day (BID) | INTRAMUSCULAR | Status: DC
  Administered 2013-07-23 – 2013-07-27 (×8): 3 mL via INTRAVENOUS

## 2013-07-23 MED ORDER — SIMVASTATIN 20 MG PO TABS
20.0000 mg | ORAL_TABLET | Freq: Every day | ORAL | Status: DC
  Administered 2013-07-23 – 2013-07-26 (×4): 20 mg via ORAL
  Filled 2013-07-23 (×4): qty 1

## 2013-07-23 MED ORDER — METOCLOPRAMIDE HCL 10 MG PO TABS: 10.0000 mg | ORAL_TABLET | Freq: Three times a day (TID) | ORAL | Status: DC | PRN

## 2013-07-23 MED ORDER — SIMETHICONE 80 MG PO CHEW
80.0000 mg | CHEWABLE_TABLET | Freq: Four times a day (QID) | ORAL | Status: DC | PRN
  Administered 2013-07-24 – 2013-07-26 (×4): 80 mg via ORAL
  Filled 2013-07-23 (×4): qty 1

## 2013-07-23 MED ORDER — IOHEXOL 300 MG/ML  SOLN
80.0000 mL | Freq: Once | INTRAMUSCULAR | Status: AC | PRN
  Administered 2013-07-23: 80 mL via INTRAVENOUS

## 2013-07-23 NOTE — ED Notes (Signed)
Repeated oral glucose, given coke to drink, pt alert at this time, pt is not symptomatic at this time.

## 2013-07-23 NOTE — H&P (Signed)
[facility] History and Physical  Chris [name]:[id number] DOB: October 27, 1940 DOA: 07/23/2013  Referring physician: Emergency Department PCP: Donzetta Sprung, MD  Specialists:   Chief Complaint: Abd pain  HPI: Chris [name] is a 72 y.o. male  With a hx of CVA, anemia, afib, htn, who presented to the ED initially with complaints of abd discomfort. In the ED, the patient was found to have a large L pleural effusion with findings suggestive of possible PNA. The patient was started on abx and the hospitalist was consulted for admission.  Review of Systems: Per above, the remainder of 10pt ros reviewed and are neg  Past Medical History  Diagnosis Date  . 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 Diabetes mellitus   . Macrocytosis     normal B12 & folate levels  . Ischemic cardiomyopathy     EF of 45-50% - posterior & inferior walls are severly hypokinetic  . Atrial fibrillation     not candidate for chronic anticoagulation with Coumadin given chrronic epistaxis as well as hemorrhagic conversion of his acute cva   . Hyperlipidemia   . Hypertension   . Complication of anesthesia   . PONV (postoperative nausea and vomiting)   . Acute renal insufficiency     resolved  . Myocardial infarct    Past Surgical History  Procedure Laterality Date  . Coronary artery bypass graft    . Tonsillectomy    . Esophagogastroduodenoscopy (egd) with esophageal dilation N/A 10/27/2012    Dr. Jena Gauss: distal esophageal diverticulum, non-complete Schatzki's ring s/p dilation, distal esophageal nodule with benign path, negative Barrett's  . Cholecystectomy  2008  . Colonoscopy N/A 12/23/2012    Procedure: COLONOSCOPY;  Surgeon: Corbin Ade, MD;  Location: [facility];  Service: Endoscopy;  Laterality: N/A;  9:45  . Cardiac surgery     Social History:  reports that he has never smoked. He does not have any smokeless tobacco  history on file. He reports that he does not drink alcohol or use illicit drugs.  where does patient live--home, ALF, SNF? and with whom if at home?  Can patient participate in ADLs?  Allergies  Allergen Reactions  . Ciprofloxacin Other (See Comments)    Sores all on legs.   . Xanax [Alprazolam] Other (See Comments)    Not in right state of mind.     Family History  Problem Relation Age of Onset  . Colon cancer Neg Hx   . Colon polyps Sister   . Colon polyps Sister     (be sure to complete)  Prior to Admission medications   Medication Sig Start Date End Date Taking? Authorizing Provider  aspirin EC 81 MG tablet Take 81 mg by mouth daily.   Yes Historical Provider, MD  Cholecalciferol (VITAMIN D-3) 5000 UNITS TABS Take 5,000 Units by mouth daily.    Yes Historical Provider, MD  finasteride (PROSCAR) 5 MG tablet Take 5 mg by mouth daily. 10/08/12  Yes Historical Provider, MD  furosemide (LASIX) 40 MG tablet Take 40 mg by mouth 2 (two) times daily as needed (Swelling).    Yes Historical Provider, MD  insulin glargine (LANTUS) 100 UNIT/ML injection Inject 40 Units into the skin daily.  02/03/13  Yes Historical Provider, MD  lisinopril (PRINIVIL,ZESTRIL) 20 MG tablet Take 1 tablet by mouth daily with lunch. 06/28/13  Yes Historical Provider, MD  LORazepam (ATIVAN) 0.5 MG tablet Take 0.5 mg  by mouth 2 (two) times daily as needed for anxiety.   Yes Historical Provider, MD  lovastatin (MEVACOR) 40 MG tablet Take 40 mg by mouth at bedtime.     Yes Historical Provider, MD  magnesium oxide (MAG-OX) 400 MG tablet Take 400 mg by mouth daily.   Yes Historical Provider, MD  metoCLOPramide (REGLAN) 10 MG tablet Take 1 tablet by mouth 3 (three) times daily as needed (digestion problems).  07/19/13  Yes Historical Provider, MD  metoprolol (LOPRESSOR) 50 MG tablet Take 50 mg by mouth 2 (two) times daily.     Yes Historical Provider, MD  prednisoLONE acetate (PRED FORTE) 1 % ophthalmic suspension Place 1  drop into both eyes 4 (four) times daily as needed (Dry Eyes).   Yes Historical Provider, MD  simethicone (GAS-X) 80 MG chewable tablet Chew 80 mg by mouth every 6 (six) hours as needed for flatulence.   Yes Historical Provider, MD   Physical Exam: Filed Vitals:   07/23/13 1044 07/23/13 1048 07/23/13 1245 07/23/13 1442  BP:  159/82 160/88 169/94  Pulse:  80 82 80  Temp:  97.8 F (36.6 C)    TempSrc:  Oral    Resp: 20 20 18 18   Height: 6\' 3"  (1.905 m) 6\' 3"  (1.905 m)    Weight: 108.863 kg (240 lb) 108.863 kg (240 lb)    SpO2: 99% 99% 97% 96%     General:  Awake, in nad  Eyes: PERRL b  ENT: membrane moist, dentition fair  Neck: trachea midline, neck supple  Cardiovascular: irregular, s1, s2  Respiratory: normal resp effort, decreased Bs on the L  Abdomen: soft, nondistended  Skin: normal skin turgor, patch of erythema w/ central blister on post of LLE  Musculoskeletal: perfused, no clubbing  Psychiatric: mood/affect normal // no auditory/visual hallucinations  Neurologic: cn2-12 grossly intact, strength/sensation intact  Labs on Admission:  Basic Metabolic Panel:  Recent Labs Lab 07/23/13 1052  NA 135  K 4.1  CL 98  CO2 33*  GLUCOSE 61*  BUN 34*  CREATININE 1.77*  CALCIUM 9.5   Liver Function Tests:  Recent Labs Lab 07/23/13 1052  AST 30  ALT 24  ALKPHOS 81  BILITOT 1.0  PROT 7.6  ALBUMIN 3.5    Recent Labs Lab 07/23/13 1052  LIPASE 57   No results found for this basename: AMMONIA,  in the last 168 hours CBC:  Recent Labs Lab 07/23/13 1052  WBC 7.9  NEUTROABS 5.4  HGB 12.6*  HCT 37.2*  MCV 100.5*  PLT 209   Cardiac Enzymes: No results found for this basename: CKTOTAL, CKMB, CKMBINDEX, TROPONINI,  in the last 168 hours  BNP (last 3 results) No results found for this basename: PROBNP,  in the last 8760 hours CBG:  Recent Labs Lab 07/23/13 1047 07/23/13 1436  GLUCAP 61* 50*    Radiological Exams on Admission: Dg Chest 1  View  07/23/2013   CLINICAL DATA:  Shortness of breath.  EXAM: CHEST - 1 VIEW  COMPARISON:  02/10/2013  FINDINGS: Opacification throughout the right mid and lower chest region. Findings are consistent with a large right pleural effusion. Prominent lung markings may represent mild edema. Heart size is upper limits of normal. Patient has median sternotomy wires.  IMPRESSION: Large right pleural effusion.  Mild edema or vascular congestion.   Electronically Signed   By: Richarda Overlie M.D.   On: 07/23/2013 13:38   Ct Abdomen Pelvis W Contrast  07/23/2013   CLINICAL DATA:  Upper abdominal pain and nausea  EXAM: CT ABDOMEN AND PELVIS WITH CONTRAST  TECHNIQUE: Multidetector CT imaging of the abdomen and pelvis was performed using the standard protocol following bolus administration of intravenous contrast.  CONTRAST:  50mL OMNIPAQUE IOHEXOL 300 MG/ML SOLN, 80mL OMNIPAQUE IOHEXOL 300 MG/ML SOLN  COMPARISON:  11/23/2012  FINDINGS: New right middle lobe and lower lobe consolidation with moderate right pleural effusion partly visualized. Small left pleural effusion with curvilinear left basilar atelectasis or scarring again noted.  Cholecystectomy clips are noted. Liver, adrenal glands, fatty infiltrated pancreas, spleen are unremarkable. There is renal cortical thinning with areas of focal renal cortical scarring without hydronephrosis or radiopaque renal or ureteral calculus. Mild to moderate atheromatous aortic calcification without aneurysm.  There is minimal stranding about the bladder with trace adjacent fluid. This finding is stable. Mild prostatomegaly again noted, 5.8 cm maximally. This produces impression upon the bladder base. No bowel wall thickening or focal segmental dilatation. The appendix is normal. Seminal vesicle calcification is identified. No lymphadenopathy. Presacral edema is present. No lytic or sclerotic osseous lesion or acute finding. Degenerative change noted in the spine.  IMPRESSION: New moderate  right pleural effusion with collapse/ consolidation of the right middle and lower lobes, respectively. This may be due to adjacent mass effect although superimposed pneumonia could be obscured.  Stable trace left pleural effusion.  No specific abnormality is identified to explain the history of abdominal pain; there is stable minimal stranding about the bladder with adjacent trace fluid, of unknown etiology.   Electronically Signed   By: Christiana Pellant M.D.   On: 07/23/2013 13:51    Assessment/Plan Active Problems:   GERD (gastroesophageal reflux disease)   Abdominal pain   A-fib   Pleural effusion, left   CVA (cerebral infarction)   HTN (hypertension)   DM (diabetes mellitus)   1. Suspected CAP 1. Cont with empiric rocephin and azithromycin 2. Presently afebrile 3. Will admit to med/tele bed 4. F/u pan cultures 2. Afib 1. Rate controlled 2. Cont ASA for now, consider therapeutic anticoagulation for secondary stroke prevention? 3. Cont meds for now 3. Pleural effusion 1. Question secondary to pneumonia 2. Cont abx for now 3. Consider IR guided thoracentesis  4. DM 1. Cont home insulin 2. Will add SSI coverage 3. Will check A1c 5. DVT prophylaxis 1. Heparin sub q  Code Status: Full (must indicate code status--if unknown or must be presumed, indicate so) Family Communication: Pt in room (indicate person spoken with, if applicable, with phone number if by telephone) Disposition Plan: Pending (indicate anticipated LOS)  Time spent:  Shirelle Tootle K [facility] Pager [phone]  If 7PM-7AM, please contact night-coverage [email or web address] Password [email or web address] 07/23/2013, 2:51 PM

## 2013-07-23 NOTE — ED Provider Notes (Signed)
CSN: [id number]     Arrival date & time 07/23/13  1029 History  This chart was scribed for Hilario Quarry, MD,  by Ashley Jacobs, ED Scribe. The patient was seen in room APA17/[facility]17 and the patient's care was started at 10:45 AM.   First MD Initiated Contact with Patient 07/23/13 1029     Chief Complaint  Patient presents with  . Shortness of Breath  . Abdominal Pain   (Consider location/radiation/quality/duration/timing/severity/associated sxs/prior Treatment) Patient is a 72 y.o. male presenting with shortness of breath and abdominal pain. The history is provided by the patient, medical records and a relative. No language interpreter was used.  Shortness of Breath Associated symptoms: abdominal pain   Associated symptoms: no vomiting   Abdominal Pain Associated symptoms: constipation and shortness of breath   Associated symptoms: no diarrhea, no nausea and no vomiting    HPI Comments: Chris [name] is a 72 y.o. male ,who was advised to go to the ED at his doctor appointment this morning, presents to the Emergency Department complaining of lower diffuse lower abdominal pain for the past four days. Pt had a laxative this morning and states he has irregular bowel movements at baseline. He is eating and drinking regularly. Pt's wife mentions that he switched a Rx t 3-4 months ago and now he complains the drugs "dehydrates" him. He had a laxative, Gas X,  and suppository this morning with no improvement. Pt had previous similar episode that resulted in a visit to the ED where he experienced impacted bowels. He also complains of SOB that presented this morning. He has the associated symptom of irregular heart beat, but he explains this is an on going concern for month. He denies having recurrent abdominal pain. This morning at his doctor  appointement he had a glucose level of 65, upon arrival he has a level of 61. Pt has a hx of constipation. He has allergies to Ciprofloxacin and Xanax.    Past  Medical History  Diagnosis Date  . 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 Diabetes mellitus   . Macrocytosis     normal B12 & folate levels  . Ischemic cardiomyopathy     EF of 45-50% - posterior & inferior walls are severly hypokinetic  . Atrial fibrillation     not candidate for chronic anticoagulation with Coumadin given chrronic epistaxis as well as hemorrhagic conversion of his acute cva   . Hyperlipidemia   . Hypertension   . Complication of anesthesia   . PONV (postoperative nausea and vomiting)   . Acute renal insufficiency     resolved  . Myocardial infarct    Past Surgical History  Procedure Laterality Date  . Coronary artery bypass graft    . Tonsillectomy    . Esophagogastroduodenoscopy (egd) with esophageal dilation N/A 10/27/2012    Dr. Jena Gauss: distal esophageal diverticulum, non-complete Schatzki's ring s/p dilation, distal esophageal nodule with benign path, negative Barrett's  . Cholecystectomy  2008  . Colonoscopy N/A 12/23/2012    Procedure: COLONOSCOPY;  Surgeon: Corbin Ade, MD;  Location: [facility];  Service: Endoscopy;  Laterality: N/A;  9:45  . Cardiac surgery     Family History  Problem Relation Age of Onset  . Colon cancer Neg Hx   . Colon polyps Sister   . Colon polyps Sister    History  Substance Use Topics  . Smoking status: Never Smoker   .  Smokeless tobacco: Not on file  . Alcohol Use: No    Review of Systems  Constitutional: Negative for appetite change.  Respiratory: Positive for shortness of breath.   Gastrointestinal: Positive for abdominal pain, constipation and abdominal distention. Negative for nausea, vomiting, diarrhea and blood in stool.  All other systems reviewed and are negative.    Allergies  Ciprofloxacin and Xanax  Home Medications   Current Outpatient Rx  Name  Route  Sig  Dispense  Refill  . aspirin EC 81 MG tablet   Oral   Take 81 mg  by mouth daily.         . Cholecalciferol (VITAMIN D-3) 5000 UNITS TABS   Oral   Take 5,000 Units by mouth daily.          . finasteride (PROSCAR) 5 MG tablet   Oral   Take 5 mg by mouth daily.         . furosemide (LASIX) 40 MG tablet   Oral   Take 40 mg by mouth 2 (two) times daily as needed (Swelling).          . insulin glargine (LANTUS) 100 UNIT/ML injection   Subcutaneous   Inject 40 Units into the skin daily.          Marland Kitchen LORazepam (ATIVAN) 0.5 MG tablet   Oral   Take 0.5 mg by mouth 2 (two) times daily as needed for anxiety.         . lovastatin (MEVACOR) 40 MG tablet   Oral   Take 40 mg by mouth at bedtime.           . magnesium oxide (MAG-OX) 400 MG tablet   Oral   Take 400 mg by mouth daily.         . metoprolol (LOPRESSOR) 50 MG tablet   Oral   Take 50 mg by mouth 2 (two) times daily.           . ondansetron (ZOFRAN) 4 MG tablet   Oral   Take 4 mg by mouth daily as needed for nausea.         . polyethylene glycol powder (GLYCOLAX/MIRALAX) powder   Oral   Take 17 g by mouth daily as needed. For constipation.         . prednisoLONE acetate (PRED FORTE) 1 % ophthalmic suspension   Both Eyes   Place 1 drop into both eyes 4 (four) times daily as needed (Dry Eyes).         . RABEprazole (ACIPHEX) 20 MG tablet   Oral   Take 20 mg by mouth every morning.         . simethicone (GAS-X) 80 MG chewable tablet   Oral   Chew 80 mg by mouth every 6 (six) hours as needed for flatulence.          BP 159/82  Pulse 80  Temp(Src) 97.8 F (36.6 C) (Oral)  Resp 20  Ht 6\' 3"  (1.905 m)  Wt 240 lb (108.863 kg)  BMI 30 kg/m2  SpO2 99% Physical Exam  Abdominal: He exhibits distension.    ED Course  Procedures (including critical care time) DIAGNOSTIC STUDIES: Oxygen Saturation is 99% on room air, normal by my interpretation.    COORDINATION OF CARE: 10:50 AM Discussed course of care with pt . Pt understands and agrees.  Labs  Review Labs Reviewed - No data to display Imaging Review No results found.  EKG Interpretation   None  Date: 07/23/2013  Rate: 84  Rhythm: atrial fibrillation  QRS Axis: normal  Intervals: normal  ST/T Wave abnormalities: nonspecific ST/T changes  Conduction Disutrbances:none  Narrative Interpretation:   Old EKG Reviewed: unchanged I personally performed the services described in this documentation, which was scribed in my presence. The recorded information has been reviewed and considered.   MDM  No diagnosis found. 72 y.o. Male with iddm , ho paroxysmal  a fib, presents today with abdominal discomfort and dyspnea.  Work up significant for new large right pleural effusion and atrial fibrillation.  Cannot rule post obstructive pneumonia.  Will treat with antibiotics and consult hospitalist for admission.     Hilario Quarry, MD 07/23/13 [phone]

## 2013-07-23 NOTE — ED Notes (Signed)
Pt and wife reports that he has been sick for 1 week w/ generalized ab pain, woke this am and thinks his "heart is out of rhythm".  Vague with history.

## 2013-07-23 NOTE — ED Notes (Signed)
Pt states feels better since receiving fluids, denies pain at this time.

## 2013-07-23 NOTE — ED Notes (Signed)
Pt does not want to take any pain medicine at this time. Requested to "give it some time and see how the fluids do, I have been dehydrated in the past and the fluids took care of my pain and I'm not really hurting at the moment".

## 2013-07-24 DIAGNOSIS — K5900 Constipation, unspecified: Secondary | ICD-10-CM

## 2013-07-24 LAB — COMPREHENSIVE METABOLIC PANEL
ALT: 18 U/L (ref 0–53)
AST: 25 U/L (ref 0–37)
Alkaline Phosphatase: 69 U/L (ref 39–117)
CO2: 30 mEq/L (ref 19–32)
Calcium: 8.5 mg/dL (ref 8.4–10.5)
Chloride: 101 mEq/L (ref 96–112)
GFR calc Af Amer: 39 mL/min — ABNORMAL LOW (ref >90)
GFR calc non Af Amer: 34 mL/min — ABNORMAL LOW (ref >90)
Glucose, Bld: 136 mg/dL — ABNORMAL HIGH (ref 70–99)
Potassium: 3.8 mEq/L (ref 3.5–5.1)
Sodium: 137 mEq/L (ref 135–145)
Total Bilirubin: 0.5 mg/dL (ref 0.3–1.2)
Total Protein: 6.4 g/dL (ref 6.0–8.3)

## 2013-07-24 LAB — CBC
Hemoglobin: 11.1 g/dL — ABNORMAL LOW (ref 13.0–17.0)
MCH: 34.4 pg — ABNORMAL HIGH (ref 26.0–34.0)
MCHC: 33.3 g/dL (ref 30.0–36.0)
MCV: 103.1 fL — ABNORMAL HIGH (ref 78.0–100.0)
RDW: 11.9 % (ref 11.5–15.5)

## 2013-07-24 LAB — GLUCOSE, CAPILLARY
Glucose-Capillary: 128 mg/dL — ABNORMAL HIGH (ref 70–99)
Glucose-Capillary: 220 mg/dL — ABNORMAL HIGH (ref 70–99)

## 2013-07-24 LAB — STREP PNEUMONIAE URINARY ANTIGEN: Strep Pneumo Urinary Antigen: NEGATIVE

## 2013-07-24 MED ORDER — LUBIPROSTONE 24 MCG PO CAPS
24.0000 ug | ORAL_CAPSULE | Freq: Two times a day (BID) | ORAL | Status: DC
  Administered 2013-07-24 – 2013-07-27 (×5): 24 ug via ORAL
  Filled 2013-07-24 (×10): qty 1

## 2013-07-24 MED ORDER — INSULIN GLARGINE 100 UNIT/ML ~~LOC~~ SOLN: 35.0000 [IU] | Freq: Every day | SUBCUTANEOUS | Status: DC

## 2013-07-24 MED ORDER — INSULIN GLARGINE 100 UNIT/ML ~~LOC~~ SOLN
35.0000 [IU] | Freq: Every day | SUBCUTANEOUS | Status: DC
  Administered 2013-07-24 – 2013-07-26 (×3): 35 [IU] via SUBCUTANEOUS
  Filled 2013-07-24 (×5): qty 0.35

## 2013-07-24 NOTE — Progress Notes (Signed)
[facility] PROGRESS NOTE  Chris [name] ZOX:[id number] DOB: 09-30-1940 DOA: 07/23/2013 PCP: Donzetta Sprung, MD  Assessment/Plan: Suspected CAP  1. Cont with empiric rocephin and azithromycin 2. Presently afebrile 3. F/u pan cultures Afib  1. Rate controlled 2. Cont ASA for now, consider therapeutic anticoagulation for secondary stroke prevention? 3. Cont meds for now Pleural effusion  1. Question secondary to pneumonia 2. Cont abx for now 3. Consider IR guided thoracentesis when available 4. Sats appropriate on minimal O2 support DM  1. Cont home insulin 2. Will add SSI coverage 3. Will check A1c DVT prophylaxis  1. Heparin sub q  Code Status: Full Family Communication: Pt in room (indicate person spoken with, relationship, and if by phone, the number) Disposition Plan: Pending  Antibiotics:  Rocephin 07/23/13>>>  Azithromycin 07/23/13>>>  HPI/Subjective: No complaints. Eager to go home.  Objective: Filed Vitals:   07/23/13 1442 07/23/13 1540 07/23/13 2148 07/24/13 0639  BP: 169/94 162/75 152/65 149/72  Pulse: 80 91 79 74  Temp:  98.1 F (36.7 C) 98.1 F (36.7 C) 98.3 F (36.8 C)  TempSrc:  Oral Oral Oral  Resp: 18 18 20 17   Height:  6\' 3"  (1.905 m)    Weight:  108.5 kg (239 lb 3.2 oz)    SpO2: 96% 99% 93% 95%    Intake/Output Summary (Last 24 hours) at 07/24/13 1134 Last data filed at 07/24/13 0900  Gross per 24 hour  Intake    720 ml  Output      0 ml  Net    720 ml   Filed Weights   07/23/13 1044 07/23/13 1048 07/23/13 1540  Weight: 108.863 kg (240 lb) 108.863 kg (240 lb) 108.5 kg (239 lb 3.2 oz)    Exam:   General:  Awake, in nad  Cardiovascular: irregular, s1, s2  Respiratory: normal resp effort, no wheezing, decreased BS  Abdomen: soft, nondisnteded  Musculoskeletal: perfused, no clubbing   Data Reviewed: Basic Metabolic Panel:  Recent Labs Lab 07/23/13 1052 07/24/13 0530  NA 135 137  K 4.1 3.8  CL 98 101  CO2 33* 30   GLUCOSE 61* 136*  BUN 34* 31*  CREATININE 1.77* 1.88*  CALCIUM 9.5 8.5   Liver Function Tests:  Recent Labs Lab 07/23/13 1052 07/24/13 0530  AST 30 25  ALT 24 18  ALKPHOS 81 69  BILITOT 1.0 0.5  PROT 7.6 6.4  ALBUMIN 3.5 3.0*    Recent Labs Lab 07/23/13 1052  LIPASE 57   No results found for this basename: AMMONIA,  in the last 168 hours CBC:  Recent Labs Lab 07/23/13 1052 07/24/13 0530  WBC 7.9 6.0  NEUTROABS 5.4  --   HGB 12.6* 11.1*  HCT 37.2* 33.3*  MCV 100.5* 103.1*  PLT 209 175   Cardiac Enzymes: No results found for this basename: CKTOTAL, CKMB, CKMBINDEX, TROPONINI,  in the last 168 hours BNP (last 3 results) No results found for this basename: PROBNP,  in the last 8760 hours CBG:  Recent Labs Lab 07/23/13 1700 07/23/13 2132 07/24/13 0254 07/24/13 0747 07/24/13 1116  GLUCAP 129* 99 131* 128* 220*    Recent Results (from the past 240 hour(s))  CULTURE, BLOOD (ROUTINE X 2)     Status: None   Collection Time    07/23/13  4:10 PM      Result Value Range Status   Specimen Description     Final   Value: BLOOD BLOOD RIGHT ARM BOTTLES DRAWN AEROBIC AND ANAEROBIC  Special Requests [facility]   Final   Culture PENDING   Incomplete   Report Status PENDING   Incomplete  CULTURE, BLOOD (ROUTINE X 2)     Status: None   Collection Time    07/23/13  4:20 PM      Result Value Range Status   Specimen Description     Final   Value: BLOOD BLOOD LEFT HAND BOTTLES DRAWN AEROBIC AND ANAEROBIC   Special Requests [facility]   Final   Culture PENDING   Incomplete   Report Status PENDING   Incomplete     Studies: Dg Chest 1 View  07/23/2013   CLINICAL DATA:  Shortness of breath.  EXAM: CHEST - 1 VIEW  COMPARISON:  02/10/2013  FINDINGS: Opacification throughout the right mid and lower chest region. Findings are consistent with a large right pleural effusion. Prominent lung markings may represent mild edema. Heart size is upper limits of normal. Patient has median  sternotomy wires.  IMPRESSION: Large right pleural effusion.  Mild edema or vascular congestion.   Electronically Signed   By: Richarda Overlie M.D.   On: 07/23/2013 13:38   Ct Abdomen Pelvis W Contrast  07/23/2013   CLINICAL DATA:  Upper abdominal pain and nausea  EXAM: CT ABDOMEN AND PELVIS WITH CONTRAST  TECHNIQUE: Multidetector CT imaging of the abdomen and pelvis was performed using the standard protocol following bolus administration of intravenous contrast.  CONTRAST:  50mL OMNIPAQUE IOHEXOL 300 MG/ML SOLN, 80mL OMNIPAQUE IOHEXOL 300 MG/ML SOLN  COMPARISON:  11/23/2012  FINDINGS: New right middle lobe and lower lobe consolidation with moderate right pleural effusion partly visualized. Small left pleural effusion with curvilinear left basilar atelectasis or scarring again noted.  Cholecystectomy clips are noted. Liver, adrenal glands, fatty infiltrated pancreas, spleen are unremarkable. There is renal cortical thinning with areas of focal renal cortical scarring without hydronephrosis or radiopaque renal or ureteral calculus. Mild to moderate atheromatous aortic calcification without aneurysm.  There is minimal stranding about the bladder with trace adjacent fluid. This finding is stable. Mild prostatomegaly again noted, 5.8 cm maximally. This produces impression upon the bladder base. No bowel wall thickening or focal segmental dilatation. The appendix is normal. Seminal vesicle calcification is identified. No lymphadenopathy. Presacral edema is present. No lytic or sclerotic osseous lesion or acute finding. Degenerative change noted in the spine.  IMPRESSION: New moderate right pleural effusion with collapse/ consolidation of the right middle and lower lobes, respectively. This may be due to adjacent mass effect although superimposed pneumonia could be obscured.  Stable trace left pleural effusion.  No specific abnormality is identified to explain the history of abdominal pain; there is stable minimal stranding  about the bladder with adjacent trace fluid, of unknown etiology.   Electronically Signed   By: Christiana Pellant M.D.   On: 07/23/2013 13:51    Scheduled Meds: . aspirin EC  81 mg Oral Daily  . azithromycin  500 mg Oral Q24H  . cefTRIAXone (ROCEPHIN)  IV  1 g Intravenous Q24H  . finasteride  5 mg Oral Daily  . heparin  5,000 Units Subcutaneous Q8H  .  HYDROmorphone (DILAUDID) injection  1 mg Intravenous Once  . insulin aspart  0-15 Units Subcutaneous TID WC  . insulin aspart  0-5 Units Subcutaneous QHS  . insulin glargine  35 Units Subcutaneous Daily  . lisinopril  20 mg Oral Q lunch  . metoprolol  50 mg Oral BID  . simvastatin  20 mg Oral q1800  . sodium  chloride  3 mL Intravenous Q12H   Continuous Infusions:   Active Problems:   GERD (gastroesophageal reflux disease)   Abdominal pain   A-fib   Pleural effusion, left   CVA (cerebral infarction)   HTN (hypertension)   DM (diabetes mellitus)    Time spent:    CHIU, STEPHEN K  [facility] Pager [phone]. If 7PM-7AM, please contact night-coverage at [email or web address], password [facility] 07/24/2013, 11:34 AM  LOS: 1 day

## 2013-07-24 NOTE — Progress Notes (Signed)
ANTIBIOTIC CONSULT NOTE - INITIAL  Pharmacy Consult for Antibiotic renal adjustment Indication: rule out pneumonia  Allergies  Allergen Reactions  . Ciprofloxacin Other (See Comments)    Sores all on legs.   . Xanax [Alprazolam] Other (See Comments)    Not in right state of mind.     Patient Measurements: Height: 6\' 3"  (190.5 cm) Weight: 239 lb 3.2 oz (108.5 kg) IBW/kg (Calculated) : 84.5 Adjusted Body Weight:   Vital Signs: Temp: 98.3 F (36.8 C) (11/02 0639) Temp src: Oral (11/02 0639) BP: 149/72 mmHg (11/02 0639) Pulse Rate: 74 (11/02 0639) Intake/Output from previous day: 11/01 0701 - 11/02 0700 In: 240 [P.O.:240] Out: -  Intake/Output from this shift:    Labs:  Recent Labs  07/23/13 1052 07/24/13 0530  WBC 7.9 6.0  HGB 12.6* 11.1*  PLT 209 175  CREATININE 1.77* 1.88*   Estimated Creatinine Clearance: 45.9 ml/min (by C-G formula based on Cr of 1.88). No results found for this basename: VANCOTROUGH, Leodis Binet, VANCORANDOM, GENTTROUGH, GENTPEAK, GENTRANDOM, TOBRATROUGH, TOBRAPEAK, TOBRARND, AMIKACINPEAK, AMIKACINTROU, AMIKACIN,  in the last 72 hours   Microbiology: Recent Results (from the past 720 hour(s))  CULTURE, BLOOD (ROUTINE X 2)     Status: None   Collection Time    07/23/13  4:10 PM      Result Value Range Status   Specimen Description     Final   Value: BLOOD BLOOD RIGHT ARM BOTTLES DRAWN AEROBIC AND ANAEROBIC   Special Requests [facility]   Final   Culture PENDING   Incomplete   Report Status PENDING   Incomplete  CULTURE, BLOOD (ROUTINE X 2)     Status: None   Collection Time    07/23/13  4:20 PM      Result Value Range Status   Specimen Description     Final   Value: BLOOD BLOOD LEFT HAND BOTTLES DRAWN AEROBIC AND ANAEROBIC   Special Requests [facility]   Final   Culture PENDING   Incomplete   Report Status PENDING   Incomplete    Medical History: Past Medical History  Diagnosis Date  . Acute cerebrovascular accident     presumed embolic from  his atrial fibrillation, with hemorrhagic conbersion  . Diabetes mellitus   . Macrocytosis     normal B12 & folate levels  . Ischemic cardiomyopathy     EF of 45-50% - posterior & inferior walls are severly hypokinetic  . Atrial fibrillation     not candidate for chronic anticoagulation with Coumadin given chrronic epistaxis as well as hemorrhagic conversion of his acute cva   . Hyperlipidemia   . Hypertension   . Complication of anesthesia   . PONV (postoperative nausea and vomiting)   . Acute renal insufficiency     resolved  . Myocardial infarct     Medications:  Scheduled:  . aspirin EC  81 mg Oral Daily  . azithromycin  500 mg Oral Q24H  . cefTRIAXone (ROCEPHIN)  IV  1 g Intravenous Q24H  . finasteride  5 mg Oral Daily  . heparin  5,000 Units Subcutaneous Q8H  .  HYDROmorphone (DILAUDID) injection  1 mg Intravenous Once  . insulin aspart  0-15 Units Subcutaneous TID WC  . insulin aspart  0-5 Units Subcutaneous QHS  . insulin glargine  40 Units Subcutaneous Daily  . lisinopril  20 mg Oral Q lunch  . metoprolol  50 mg Oral BID  . simvastatin  20 mg Oral q1800  . sodium chloride  3 mL Intravenous Q12H   Assessment: Possible post obstructive pneumonia Rocephin 1 GM IV every 24 hours started Azithromycin 500 mg IV x1, then 500 mg po daily started CrCl 45.9 ml/min  Goal of Therapy:  Eradicate infection  Plan:  Continue Rocephin 1 GM IV every 24 hours Continue Azithromycin 500 mg po daily No renal adjustment needed.   Chris [name], Chris [name] Chris [name] 07/24/2013,8:02 AM

## 2013-07-25 ENCOUNTER — Inpatient Hospital Stay (HOSPITAL_COMMUNITY): Payer: Medicare Other

## 2013-07-25 LAB — GLUCOSE, CAPILLARY
Glucose-Capillary: 127 mg/dL — ABNORMAL HIGH (ref 70–99)
Glucose-Capillary: 174 mg/dL — ABNORMAL HIGH (ref 70–99)
Glucose-Capillary: 242 mg/dL — ABNORMAL HIGH (ref 70–99)
Glucose-Capillary: 65 mg/dL — ABNORMAL LOW (ref 70–99)

## 2013-07-25 LAB — CBC
Hemoglobin: 12.5 g/dL — ABNORMAL LOW (ref 13.0–17.0)
MCH: 34.6 pg — ABNORMAL HIGH (ref 26.0–34.0)
MCV: 102.2 fL — ABNORMAL HIGH (ref 78.0–100.0)
Platelets: 219 10*3/uL (ref 150–400)
RDW: 12.7 % (ref 11.5–15.5)

## 2013-07-25 LAB — BASIC METABOLIC PANEL
Calcium: 9.2 mg/dL (ref 8.4–10.5)
Creatinine, Ser: 2.11 mg/dL — ABNORMAL HIGH (ref 0.50–1.35)
GFR calc Af Amer: 34 mL/min — ABNORMAL LOW (ref >90)
GFR calc non Af Amer: 29 mL/min — ABNORMAL LOW (ref >90)
Glucose, Bld: 130 mg/dL — ABNORMAL HIGH (ref 70–99)
Potassium: 4.1 mEq/L (ref 3.5–5.1)

## 2013-07-25 NOTE — Progress Notes (Signed)
Verified with Dr. Rhona Leavens & Dr. Tyron Russell that it is okay to proceed with thoracentesis tomorrow despite pt being on 81mg  ASA. I have explained this to patient multiple times, and notified Dr. Rhona Leavens that pt seems to still be slightly concerned. I explained to patient that thoracentesis was low bleeding risk per Dr. Rhona Leavens. Sheryn Bison '

## 2013-07-25 NOTE — Progress Notes (Signed)
[facility] PROGRESS NOTE  BRAILEN MACNEAL NWG:[id number] DOB: Mar 02, 1941 DOA: 07/23/2013 PCP: Donzetta Sprung, MD  Assessment/Plan: Suspected CAP  1. Cont with empiric rocephin and azithromycin 2. Presently afebrile 3. F/u pan cultures Afib  1. Rate controlled 2. Cont ASA for now, consider therapeutic anticoagulation for secondary stroke prevention? 3. Cont meds for now Pleural effusion  1. Question secondary to pneumonia 2. Cont abx for now 3. IR guided thoracentesis ordered, pending 4. Sats appropriate on minimal O2 support 5. Will f/u fluid studies DM  1. Cont home insulin 2. Will add SSI coverage 3. Will check A1c DVT prophylaxis  1. Heparin sub q  Code Status: Full Family Communication: Pt in room (indicate person spoken with, relationship, and if by phone, the number) Disposition Plan: Pending  Antibiotics:  Rocephin 07/23/13>>>  Azithromycin 07/23/13>>>  HPI/Subjective: No complaints. Eager to go home.  Objective: Filed Vitals:   07/24/13 0639 07/24/13 1300 07/24/13 2128 07/25/13 0436  BP: 149/72 148/73 138/56 147/70  Pulse: 74 78 76 76  Temp: 98.3 F (36.8 C) 98.1 F (36.7 C) 98.8 F (37.1 C) 98 F (36.7 C)  TempSrc: Oral Oral Oral Oral  Resp: 17 18 20 20   Height:      Weight:      SpO2: 95% 96% 96% 96%    Intake/Output Summary (Last 24 hours) at 07/25/13 0759 Last data filed at 07/25/13 0300  Gross per 24 hour  Intake    240 ml  Output    151 ml  Net     89 ml   Filed Weights   07/23/13 1044 07/23/13 1048 07/23/13 1540  Weight: 108.863 kg (240 lb) 108.863 kg (240 lb) 108.5 kg (239 lb 3.2 oz)    Exam:   General:  Awake, in nad  Cardiovascular: irregular, s1, s2  Respiratory: normal resp effort, no wheezing, decreased BS  Abdomen: soft, nondisnteded  Musculoskeletal: perfused, no clubbing   Data Reviewed: Basic Metabolic Panel:  Recent Labs Lab 07/23/13 1052 07/24/13 0530 07/25/13 0553  NA 135 137 140  K 4.1 3.8 4.1   CL 98 101 102  CO2 33* 30 29  GLUCOSE 61* 136* 130*  BUN 34* 31* 34*  CREATININE 1.77* 1.88* 2.11*  CALCIUM 9.5 8.5 9.2   Liver Function Tests:  Recent Labs Lab 07/23/13 1052 07/24/13 0530  AST 30 25  ALT 24 18  ALKPHOS 81 69  BILITOT 1.0 0.5  PROT 7.6 6.4  ALBUMIN 3.5 3.0*    Recent Labs Lab 07/23/13 1052  LIPASE 57   No results found for this basename: AMMONIA,  in the last 168 hours CBC:  Recent Labs Lab 07/23/13 1052 07/24/13 0530 07/25/13 0553  WBC 7.9 6.0 7.3  NEUTROABS 5.4  --   --   HGB 12.6* 11.1* 12.5*  HCT 37.2* 33.3* 36.9*  MCV 100.5* 103.1* 102.2*  PLT 209 175 219   Cardiac Enzymes: No results found for this basename: CKTOTAL, CKMB, CKMBINDEX, TROPONINI,  in the last 168 hours BNP (last 3 results) No results found for this basename: PROBNP,  in the last 8760 hours CBG:  Recent Labs Lab 07/24/13 0747 07/24/13 1116 07/24/13 1637 07/24/13 2133 07/25/13 0003  GLUCAP 128* 220* 199* 65* 174*    Recent Results (from the past 240 hour(s))  CULTURE, BLOOD (ROUTINE X 2)     Status: None   Collection Time    07/23/13  4:10 PM      Result Value Range Status   Specimen  Description     Final   Value: BLOOD BLOOD RIGHT ARM BOTTLES DRAWN AEROBIC AND ANAEROBIC   Special Requests 8CC   Final   Culture PENDING   Incomplete   Report Status PENDING   Incomplete  CULTURE, BLOOD (ROUTINE X 2)     Status: None   Collection Time    07/23/13  4:20 PM      Result Value Range Status   Specimen Description     Final   Value: BLOOD BLOOD LEFT HAND BOTTLES DRAWN AEROBIC AND ANAEROBIC   Special Requests [facility]   Final   Culture PENDING   Incomplete   Report Status PENDING   Incomplete     Studies: Dg Chest 1 View  07/23/2013   CLINICAL DATA:  Shortness of breath.  EXAM: CHEST - 1 VIEW  COMPARISON:  02/10/2013  FINDINGS: Opacification throughout the right mid and lower chest region. Findings are consistent with a large right pleural effusion. Prominent lung  markings may represent mild edema. Heart size is upper limits of normal. Patient has median sternotomy wires.  IMPRESSION: Large right pleural effusion.  Mild edema or vascular congestion.   Electronically Signed   By: Richarda Overlie M.D.   On: 07/23/2013 13:38   Ct Abdomen Pelvis W Contrast  07/23/2013   CLINICAL DATA:  Upper abdominal pain and nausea  EXAM: CT ABDOMEN AND PELVIS WITH CONTRAST  TECHNIQUE: Multidetector CT imaging of the abdomen and pelvis was performed using the standard protocol following bolus administration of intravenous contrast.  CONTRAST:  50mL OMNIPAQUE IOHEXOL 300 MG/ML SOLN, 80mL OMNIPAQUE IOHEXOL 300 MG/ML SOLN  COMPARISON:  11/23/2012  FINDINGS: New right middle lobe and lower lobe consolidation with moderate right pleural effusion partly visualized. Small left pleural effusion with curvilinear left basilar atelectasis or scarring again noted.  Cholecystectomy clips are noted. Liver, adrenal glands, fatty infiltrated pancreas, spleen are unremarkable. There is renal cortical thinning with areas of focal renal cortical scarring without hydronephrosis or radiopaque renal or ureteral calculus. Mild to moderate atheromatous aortic calcification without aneurysm.  There is minimal stranding about the bladder with trace adjacent fluid. This finding is stable. Mild prostatomegaly again noted, 5.8 cm maximally. This produces impression upon the bladder base. No bowel wall thickening or focal segmental dilatation. The appendix is normal. Seminal vesicle calcification is identified. No lymphadenopathy. Presacral edema is present. No lytic or sclerotic osseous lesion or acute finding. Degenerative change noted in the spine.  IMPRESSION: New moderate right pleural effusion with collapse/ consolidation of the right middle and lower lobes, respectively. This may be due to adjacent mass effect although superimposed pneumonia could be obscured.  Stable trace left pleural effusion.  No specific  abnormality is identified to explain the history of abdominal pain; there is stable minimal stranding about the bladder with adjacent trace fluid, of unknown etiology.   Electronically Signed   By: Christiana Pellant M.D.   On: 07/23/2013 13:51    Scheduled Meds: . aspirin EC  81 mg Oral Daily  . azithromycin  500 mg Oral Q24H  . cefTRIAXone (ROCEPHIN)  IV  1 g Intravenous Q24H  . finasteride  5 mg Oral Daily  . heparin  5,000 Units Subcutaneous Q8H  .  HYDROmorphone (DILAUDID) injection  1 mg Intravenous Once  . insulin aspart  0-15 Units Subcutaneous TID WC  . insulin aspart  0-5 Units Subcutaneous QHS  . insulin glargine  35 Units Subcutaneous Daily  . lisinopril  20 mg Oral Q  lunch  . lubiprostone  24 mcg Oral BID WC  . metoprolol  50 mg Oral BID  . simvastatin  20 mg Oral q1800  . sodium chloride  3 mL Intravenous Q12H   Continuous Infusions:   Active Problems:   GERD (gastroesophageal reflux disease)   Abdominal pain   A-fib   Pleural effusion, left   CVA (cerebral infarction)   HTN (hypertension)   DM (diabetes mellitus)    Time spent:    Nayla Dias K  [facility] Pager [phone]. If 7PM-7AM, please contact night-coverage at [email or web address], password [email or web address] 07/25/2013, 7:59 AM  LOS: 2 days

## 2013-07-26 ENCOUNTER — Encounter (HOSPITAL_COMMUNITY): Payer: Self-pay

## 2013-07-26 ENCOUNTER — Inpatient Hospital Stay (HOSPITAL_COMMUNITY): Payer: Medicare Other

## 2013-07-26 LAB — GLUCOSE, CAPILLARY
Glucose-Capillary: 98 mg/dL (ref 70–99)
Glucose-Capillary: 186 mg/dL — ABNORMAL HIGH (ref 70–99)
Glucose-Capillary: 200 mg/dL — ABNORMAL HIGH (ref 70–99)
Glucose-Capillary: 120 mg/dL — ABNORMAL HIGH (ref 70–99)

## 2013-07-26 LAB — BASIC METABOLIC PANEL
Calcium: 8.7 mg/dL (ref 8.4–10.5)
Creatinine, Ser: 2.18 mg/dL — ABNORMAL HIGH (ref 0.50–1.35)
GFR calc Af Amer: 33 mL/min — ABNORMAL LOW (ref >90)
GFR calc non Af Amer: 28 mL/min — ABNORMAL LOW (ref >90)
Glucose, Bld: 122 mg/dL — ABNORMAL HIGH (ref 70–99)
Potassium: 3.7 mEq/L (ref 3.5–5.1)
Sodium: 141 mEq/L (ref 135–145)

## 2013-07-26 LAB — BODY FLUID CELL COUNT WITH DIFFERENTIAL
Eos, Fluid: 0 %
Lymphs, Fluid: 51 %
Neutrophil Count, Fluid: 13 % (ref 0–25)
Other Cells, Fluid: 0 %

## 2013-07-26 LAB — CBC
Hemoglobin: 11.5 g/dL — ABNORMAL LOW (ref 13.0–17.0)
MCH: 34 pg (ref 26.0–34.0)
MCHC: 33.3 g/dL (ref 30.0–36.0)
RDW: 12.7 % (ref 11.5–15.5)

## 2013-07-26 MED ORDER — CEFUROXIME AXETIL 250 MG PO TABS
500.0000 mg | ORAL_TABLET | Freq: Two times a day (BID) | ORAL | Status: DC
  Administered 2013-07-26 – 2013-07-27 (×2): 500 mg via ORAL
  Filled 2013-07-26 (×3): qty 2

## 2013-07-26 MED ORDER — CEFDINIR 125 MG/5ML PO SUSR: 250.0000 mg | Freq: Two times a day (BID) | ORAL | Status: DC

## 2013-07-26 NOTE — Progress Notes (Signed)
Patient refused Aspirin several times today.  Stated that he did not want to take due to the risk for bleeding and the thoracentesis.  RN asked Dr. Rhona Leavens about the patient receiving Aspirin.  Dr. Rhona Leavens stated that it was ok for the patient to receive the Aspirin 81 mg today.  Patient refused.  Stated that he would think about it.  Dr. Rhona Leavens notified via text page.

## 2013-07-26 NOTE — Progress Notes (Signed)
Thoracentesis complete no signs of distress. 1700 ml yellow pleural fluid removed.

## 2013-07-26 NOTE — Progress Notes (Addendum)
[facility] PROGRESS NOTE  Chris [name] WUJ:[id number] DOB: 1941/03/20 DOA: 07/23/2013 PCP: Donzetta Sprung, MD  Assessment/Plan: Suspected CAP  1. Cont with empiric rocephin and azithromycin 2. Consider transition to PO abx today 3. Remains afebrile 4. Blood cx neg thus far Afib  1. Rate controlled 2. Cont ASA for now, consider therapeutic anticoagulation for secondary stroke prevention? (hx of DM, HTN, CVA) 3. Cont meds for now Pleural effusion  1. Question secondary to pneumonia 2. Cont abx for now 3. IR guided thoracentesis ordered, pending results 4. Remains on minimal O2 support 5. Will f/u fluid studies DM  1. Cont home insulin 2. Will add SSI coverage 3. Will check A1c DVT prophylaxis  1. Heparin sub q  Code Status: Full Family Communication: Pt in room (indicate person spoken with, relationship, and if by phone, the number) Disposition Plan: Pending  Antibiotics:  Rocephin 07/23/13>>>07/26/13  Azithromycin 07/23/13>>>  omnicef 07/26/13>>>  HPI/Subjective: No complaints. Eager to go home.  Objective: Filed Vitals:   07/24/13 2128 07/25/13 0436 07/25/13 2122 07/26/13 0603  BP: 138/56 147/70 150/68 157/88  Pulse: 76 76 75 75  Temp: 98.8 F (37.1 C) 98 F (36.7 C) 98.2 F (36.8 C) 98 F (36.7 C)  TempSrc: Oral Oral Oral Oral  Resp: 20 20 18 19   Height:      Weight:      SpO2: 96% 96% 93% 93%    Intake/Output Summary (Last 24 hours) at 07/26/13 0804 Last data filed at 07/25/13 1800  Gross per 24 hour  Intake    240 ml  Output      0 ml  Net    240 ml   Filed Weights   07/23/13 1044 07/23/13 1048 07/23/13 1540  Weight: 108.863 kg (240 lb) 108.863 kg (240 lb) 108.5 kg (239 lb 3.2 oz)    Exam:   General:  Awake, in nad  Cardiovascular: irregular, s1, s2  Respiratory: normal resp effort, no wheezing, decreased BS  Abdomen: soft, nondisnteded  Musculoskeletal: perfused, no clubbing   Data Reviewed: Basic Metabolic Panel:  Recent  Labs Lab 07/23/13 1052 07/24/13 0530 07/25/13 0553 07/26/13 0552  NA 135 137 140 141  K 4.1 3.8 4.1 3.7  CL 98 101 102 105  CO2 33* 30 29 27   GLUCOSE 61* 136* 130* 122*  BUN 34* 31* 34* 33*  CREATININE 1.77* 1.88* 2.11* 2.18*  CALCIUM 9.5 8.5 9.2 8.7   Liver Function Tests:  Recent Labs Lab 07/23/13 1052 07/24/13 0530  AST 30 25  ALT 24 18  ALKPHOS 81 69  BILITOT 1.0 0.5  PROT 7.6 6.4  ALBUMIN 3.5 3.0*    Recent Labs Lab 07/23/13 1052  LIPASE 57   No results found for this basename: AMMONIA,  in the last 168 hours CBC:  Recent Labs Lab 07/23/13 1052 07/24/13 0530 07/25/13 0553 07/26/13 0552  WBC 7.9 6.0 7.3 6.7  NEUTROABS 5.4  --   --   --   HGB 12.6* 11.1* 12.5* 11.5*  HCT 37.2* 33.3* 36.9* 34.5*  MCV 100.5* 103.1* 102.2* 102.1*  PLT 209 175 219 200   Cardiac Enzymes: No results found for this basename: CKTOTAL, CKMB, CKMBINDEX, TROPONINI,  in the last 168 hours BNP (last 3 results) No results found for this basename: PROBNP,  in the last 8760 hours CBG:  Recent Labs Lab 07/25/13 0738 07/25/13 1115 07/25/13 1625 07/25/13 2119 07/26/13 0734  GLUCAP 121* 127* 242* 98 123*    Recent Results (from the  past 240 hour(s))  CULTURE, BLOOD (ROUTINE X 2)     Status: None   Collection Time    07/23/13  4:10 PM      Result Value Range Status   Specimen Description     Final   Value: BLOOD BLOOD RIGHT ARM BOTTLES DRAWN AEROBIC AND ANAEROBIC   Special Requests 8CC   Final   Culture NO GROWTH 2 DAYS   Final   Report Status PENDING   Incomplete  CULTURE, BLOOD (ROUTINE X 2)     Status: None   Collection Time    07/23/13  4:20 PM      Result Value Range Status   Specimen Description     Final   Value: BLOOD BLOOD LEFT HAND BOTTLES DRAWN AEROBIC AND ANAEROBIC   Special Requests 8CC   Final   Culture NO GROWTH 2 DAYS   Final   Report Status PENDING   Incomplete     Studies: No results found.  Scheduled Meds: . aspirin EC  81 mg Oral Daily  .  azithromycin  500 mg Oral Q24H  . cefTRIAXone (ROCEPHIN)  IV  1 g Intravenous Q24H  . finasteride  5 mg Oral Daily  .  HYDROmorphone (DILAUDID) injection  1 mg Intravenous Once  . insulin aspart  0-15 Units Subcutaneous TID WC  . insulin aspart  0-5 Units Subcutaneous QHS  . insulin glargine  35 Units Subcutaneous Daily  . lisinopril  20 mg Oral Q lunch  . lubiprostone  24 mcg Oral BID WC  . metoprolol  50 mg Oral BID  . simvastatin  20 mg Oral q1800  . sodium chloride  3 mL Intravenous Q12H   Continuous Infusions:   Active Problems:   GERD (gastroesophageal reflux disease)   Abdominal pain   A-fib   Pleural effusion, left   CVA (cerebral infarction)   HTN (hypertension)   DM (diabetes mellitus)  Time spent:  CHIU, STEPHEN K  [facility] Pager [phone]. If 7PM-7AM, please contact night-coverage at [email or web address], password [facility] 07/26/2013, 8:04 AM  LOS: 3 days

## 2013-07-26 NOTE — Care Management Note (Signed)
    Page 1 of 1   07/26/2013     12:04:43 PM   CARE MANAGEMENT NOTE 07/26/2013  Patient:  Chris [name], Chris [name]   Account Number:  [id number]  Date Initiated:  07/24/2013  Documentation initiated by:  Lanier Clam  Subjective/Objective Assessment:   72 y/o m admitted w/abd pain.pna,gerd.     Action/Plan:   From home.   Anticipated DC Date:  07/27/2013   Anticipated DC Plan:  HOME/SELF CARE         Choice offered to / List presented to:             Status of service:  Completed, signed off Medicare Important Message given?   (If response is "NO", the following Medicare IM given date fields will be blank) Date Medicare IM given:   Date Additional Medicare IM given:    Discharge Disposition:    Per UR Regulation:  Reviewed for med. necessity/level of care/duration of stay  If discussed at Long Length of Stay Meetings, dates discussed:    Comments:  07/26/13 Rosemary Holms RN BSN CM No HH needs identified or anticipated

## 2013-07-26 NOTE — Procedures (Signed)
PreOperative Dx: RIGHT pleural effusion Postoperative Dx: RIGHT pleural effusion Procedure:   US guided RIGHT thoracentesis Radiologist:  Tyron Russell Anesthesia:  8 ml of 1% lidocaine Specimen:  1800 ml of clear yellow colored fluid EBL:   < 1 ml Complications: None

## 2013-07-26 NOTE — Progress Notes (Signed)
Patient states he takes Lasix 40 mg daily.  Currently has order for Lasix PRN for swelling.  Dr. Rhona Leavens notified via text page.

## 2013-07-27 DIAGNOSIS — J90 Pleural effusion, not elsewhere classified: Secondary | ICD-10-CM | POA: Diagnosis present

## 2013-07-27 DIAGNOSIS — J189 Pneumonia, unspecified organism: Principal | ICD-10-CM

## 2013-07-27 DIAGNOSIS — E119 Type 2 diabetes mellitus without complications: Secondary | ICD-10-CM

## 2013-07-27 LAB — BASIC METABOLIC PANEL
BUN: 31 mg/dL — ABNORMAL HIGH (ref 6–23)
CO2: 27 mEq/L (ref 19–32)
Chloride: 108 mEq/L (ref 96–112)
Creatinine, Ser: 2.03 mg/dL — ABNORMAL HIGH (ref 0.50–1.35)
GFR calc non Af Amer: 31 mL/min — ABNORMAL LOW (ref >90)
Glucose, Bld: 60 mg/dL — ABNORMAL LOW (ref 70–99)
Potassium: 3.6 mEq/L (ref 3.5–5.1)
Sodium: 143 mEq/L (ref 135–145)

## 2013-07-27 LAB — CBC WITH DIFFERENTIAL/PLATELET
Eosinophils Absolute: 0.2 10*3/uL (ref 0.0–0.7)
HCT: 35.7 % — ABNORMAL LOW (ref 39.0–52.0)
Hemoglobin: 11.8 g/dL — ABNORMAL LOW (ref 13.0–17.0)
Lymphocytes Relative: 21 % (ref 12–46)
Lymphs Abs: 1.7 10*3/uL (ref 0.7–4.0)
MCHC: 33.1 g/dL (ref 30.0–36.0)
Monocytes Absolute: 0.8 10*3/uL (ref 0.1–1.0)
Monocytes Relative: 10 % (ref 3–12)
Neutro Abs: 5.5 10*3/uL (ref 1.7–7.7)
RBC: 3.5 MIL/uL — ABNORMAL LOW (ref 4.22–5.81)

## 2013-07-27 LAB — GLUCOSE, CAPILLARY: Glucose-Capillary: 53 mg/dL — ABNORMAL LOW (ref 70–99)

## 2013-07-27 LAB — LACTATE DEHYDROGENASE: LDH: 208 U/L (ref 94–250)

## 2013-07-27 MED ORDER — AZITHROMYCIN 250 MG PO TABS: ORAL_TABLET | ORAL | Status: DC

## 2013-07-27 MED ORDER — LUBIPROSTONE 24 MCG PO CAPS: 24.0000 ug | ORAL_CAPSULE | Freq: Every day | ORAL | Status: DC

## 2013-07-27 MED ORDER — FUROSEMIDE 40 MG PO TABS: 40.0000 mg | ORAL_TABLET | Freq: Two times a day (BID) | ORAL | Status: DC

## 2013-07-27 MED ORDER — CEFUROXIME AXETIL 500 MG PO TABS: 500.0000 mg | ORAL_TABLET | Freq: Two times a day (BID) | ORAL | Status: DC

## 2013-07-27 NOTE — Discharge Summary (Addendum)
Physician Discharge Summary  Chris [name] ZOX:[id number] DOB: 17-Jul-1941 DOA: 07/23/2013  PCP: Donzetta Sprung, MD  Admit date: 07/23/2013 Discharge date: 07/27/2013  Time spent: 45 minutes  Recommendations for Outpatient Follow-up:  1. Dr.Ed Juanetta Gosling in 1 week 2. Needs CXR in 1 week 3. PLEASE FU CYTOLOGY of PLEURAL FLUID  Discharge Diagnoses:    Community acquired pneumonia   Right pleural effusion   GERD (gastroesophageal reflux disease)   Abdominal pain   A-fib   CVA (cerebral infarction)   HTN (hypertension)   DM (diabetes mellitus)   CHronic systolic CHF  Discharge Condition: stable  Diet recommendation:low sodium  Filed Weights   07/23/13 1044 07/23/13 1048 07/23/13 1540  Weight: 108.863 kg (240 lb) 108.863 kg (240 lb) 108.5 kg (239 lb 3.2 oz)    History of present illness:  HPI: Chris [name] is a 72 y.o. male  With a hx of CVA, anemia, afib, htn, who presented to the ED initially with complaints of abd discomfort. In the ED, the patient was found to have a large R pleural effusion with findings suggestive of possible PNA. The patient was started on abx and the hospitalist was consulted for admission.  Hospital Course:  He was admitted with CAP and large R pleural effusion, clinically improved some but most significantly after thoracentesis. Had of clear fluid removed from R lung per IR. Pleural fluid analysis was most consistent with transudate Suspect Parapneumonic effusion vs related to acute on chronic systolic CHF But CYTOLOGY still pending at this point, pt was anxious to be discharged and medically stable, and hence advised to Fu with Pulmonary Dr.Hawkins in 1 week get CXR and FU CYTOLOGY. He will complete Abx course for pneumonia and advised to continue lasix as well  Procedures:  Thoracentesis  Discharge Exam: Filed Vitals:   07/27/13 0919  BP: 126/54  Pulse: 81  Temp:   Resp:     General: AAOx3 Cardiovascular: S1S2/RRR Respiratory:  diminished at bases  Discharge Instructions  Discharge Orders   Future Orders Complete By Expires   Diet - low sodium heart healthy  As directed    Increase activity slowly  As directed        Medication List         aspirin EC 81 MG tablet  Take 81 mg by mouth daily.     azithromycin 250 MG tablet  Commonly known as:  ZITHROMAX  For 5 days     cefUROXime 500 MG tablet  Commonly known as:  CEFTIN  Take 1 tablet (500 mg total) by mouth 2 (two) times daily with a meal. For 5 days     finasteride 5 MG tablet  Commonly known as:  PROSCAR  Take 5 mg by mouth daily.     furosemide 40 MG tablet  Commonly known as:  LASIX  Take 1 tablet (40 mg total) by mouth 2 (two) times daily.     GAS-X 80 MG chewable tablet  Generic drug:  simethicone  Chew 80 mg by mouth every 6 (six) hours as needed for flatulence.     insulin glargine 100 UNIT/ML injection  Commonly known as:  LANTUS  Inject 40 Units into the skin daily.     lisinopril 20 MG tablet  Commonly known as:  PRINIVIL,ZESTRIL  Take 1 tablet by mouth daily with lunch.     LORazepam 0.5 MG tablet  Commonly known as:  ATIVAN  Take 0.5 mg by mouth 2 (two) times daily as  needed for anxiety.     lovastatin 40 MG tablet  Commonly known as:  MEVACOR  Take 40 mg by mouth at bedtime.     lubiprostone 24 MCG capsule  Commonly known as:  AMITIZA  Take 1 capsule (24 mcg total) by mouth daily with breakfast.     magnesium oxide 400 MG tablet  Commonly known as:  MAG-OX  Take 400 mg by mouth daily.     metoCLOPramide 10 MG tablet  Commonly known as:  REGLAN  Take 1 tablet by mouth 3 (three) times daily as needed (digestion problems).     metoprolol 50 MG tablet  Commonly known as:  LOPRESSOR  Take 50 mg by mouth 2 (two) times daily.     prednisoLONE acetate 1 % ophthalmic suspension  Commonly known as:  PRED FORTE  Place 1 drop into both eyes 4 (four) times daily as needed (Dry Eyes).     Vitamin D-3 5000 UNITS Tabs   Take 5,000 Units by mouth daily.       Allergies  Allergen Reactions  . Ciprofloxacin Other (See Comments)    Sores all on legs.   . Xanax [Alprazolam] Other (See Comments)    Not in right state of mind.        Follow-up Information   Follow up with HAWKINS,EDWARD L, MD In 1 week.   Specialty:  Pulmonary Disease   Contact information:   [address] [place] [address] [phone]       Follow up with FU CXR and Cytology. (Please check FU CXR and VERY IMPORTANT TO FU on CYTOLOGY of pleural fluid)        The results of significant diagnostics from this hospitalization (including imaging, microbiology, ancillary and laboratory) are listed below for reference.    Significant Diagnostic Studies: Dg Chest 1 View  07/26/2013   CLINICAL DATA:  Post thoracentesis.  EXAM: CHEST - 1 VIEW  COMPARISON:  Chest radiograph 07/23/2013.  FINDINGS: Marked interval reduction in the right pleural effusion. Small right pleural effusion persists. Cardiomegaly. No pneumothorax is identified status post thoracentesis. Monitoring leads project over the chest. CABG. Aortic arch atherosclerosis.  IMPRESSION: Interval decrease in right pleural effusion, now small following uncomplicated right thoracentesis.   Electronically Signed   By: Andreas Newport M.D.   On: 07/26/2013 09:06   Dg Chest 1 View  07/23/2013   CLINICAL DATA:  Shortness of breath.  EXAM: CHEST - 1 VIEW  COMPARISON:  02/10/2013  FINDINGS: Opacification throughout the right mid and lower chest region. Findings are consistent with a large right pleural effusion. Prominent lung markings may represent mild edema. Heart size is upper limits of normal. Patient has median sternotomy wires.  IMPRESSION: Large right pleural effusion.  Mild edema or vascular congestion.   Electronically Signed   By: Richarda Overlie M.D.   On: 07/23/2013 13:38   Ct Abdomen Pelvis W Contrast  07/23/2013   CLINICAL DATA:  Upper abdominal pain and nausea   EXAM: CT ABDOMEN AND PELVIS WITH CONTRAST  TECHNIQUE: Multidetector CT imaging of the abdomen and pelvis was performed using the standard protocol following bolus administration of intravenous contrast.  CONTRAST:  50mL OMNIPAQUE IOHEXOL 300 MG/ML SOLN, 80mL OMNIPAQUE IOHEXOL 300 MG/ML SOLN  COMPARISON:  11/23/2012  FINDINGS: New right middle lobe and lower lobe consolidation with moderate right pleural effusion partly visualized. Small left pleural effusion with curvilinear left basilar atelectasis or scarring again noted.  Cholecystectomy clips are noted.  Liver, adrenal glands, fatty infiltrated pancreas, spleen are unremarkable. There is renal cortical thinning with areas of focal renal cortical scarring without hydronephrosis or radiopaque renal or ureteral calculus. Mild to moderate atheromatous aortic calcification without aneurysm.  There is minimal stranding about the bladder with trace adjacent fluid. This finding is stable. Mild prostatomegaly again noted, 5.8 cm maximally. This produces impression upon the bladder base. No bowel wall thickening or focal segmental dilatation. The appendix is normal. Seminal vesicle calcification is identified. No lymphadenopathy. Presacral edema is present. No lytic or sclerotic osseous lesion or acute finding. Degenerative change noted in the spine.  IMPRESSION: New moderate right pleural effusion with collapse/ consolidation of the right middle and lower lobes, respectively. This may be due to adjacent mass effect although superimposed pneumonia could be obscured.  Stable trace left pleural effusion.  No specific abnormality is identified to explain the history of abdominal pain; there is stable minimal stranding about the bladder with adjacent trace fluid, of unknown etiology.   Electronically Signed   By: Christiana Pellant M.D.   On: 07/23/2013 13:51   US Thoracentesis Asp Pleural Space W/img Guide  07/26/2013   CLINICAL DATA:  Large right pleural effusion  EXAM:  ULTRASOUND GUIDED RIGHT THORACENTESIS  COMPARISON:  Chest radiograph 07/23/2013 and  FINDINGS: A total of approximately 1800 mL of clear yellow fluid was removed. A fluid sample of 180 mLsent for laboratory analysis.  IMPRESSION: Successful ultrasound guided right thoracentesis yielding 1800 mL of pleural fluid.  PROCEDURE: An ultrasound guided thoracentesis was thoroughly discussed with the patient and questions answered. The benefits, risks, alternatives and complications were also discussed. The patient understands and wishes to proceed with the procedure. Written consent was obtained. Time-out cortical followup.  Ultrasound was performed to localize and mark an adequate pocket of fluid in the right chest. The area was then prepped and draped in the normal sterile fashion. 1% Lidocaine was used for local anesthesia. Under ultrasound guidance a standard 8 French thoracentesis catheter was introduced. Thoracentesis was performed by syringe pump. The catheter was removed and a dressing applied.  Complications:  None   Electronically Signed   By: Ulyses Southward M.D.   On: 07/26/2013 10:47    Microbiology: Recent Results (from the past 240 hour(s))  CULTURE, BLOOD (ROUTINE X 2)     Status: None   Collection Time    07/23/13  4:10 PM      Result Value Range Status   Specimen Description BLOOD RIGHT ARM   Final   Special Requests BOTTLES DRAWN AEROBIC AND ANAEROBIC 8CC   Final   Culture NO GROWTH 4 DAYS   Final   Report Status PENDING   Incomplete  CULTURE, BLOOD (ROUTINE X 2)     Status: None   Collection Time    07/23/13  4:20 PM      Result Value Range Status   Specimen Description BLOOD LEFT HAND   Final   Special Requests BOTTLES DRAWN AEROBIC AND ANAEROBIC 8CC   Final   Culture NO GROWTH 4 DAYS   Final   Report Status PENDING   Incomplete  BODY FLUID CULTURE     Status: None   Collection Time    07/26/13  8:30 AM      Result Value Range Status   Specimen Description FLUID LEFT PLEURAL   Final    Special Requests NONE   Final   Gram Stain     Final   Value: RARE WBC PRESENT,BOTH  PMN AND MONONUCLEAR     NO ORGANISMS SEEN     Performed at [facility]   Culture     Final   Value: NO GROWTH 1 DAY     Performed at [facility]   Report Status PENDING   Incomplete     Labs: Basic Metabolic Panel:  Recent Labs Lab 07/23/13 1052 07/24/13 0530 07/25/13 0553 07/26/13 0552 07/27/13 0623  NA 135 137 140 141 143  K 4.1 3.8 4.1 3.7 3.6  CL 98 101 102 105 108  CO2 33* 30 29 27 27   GLUCOSE 61* 136* 130* 122* 60*  BUN 34* 31* 34* 33* 31*  CREATININE 1.77* 1.88* 2.11* 2.18* 2.03*  CALCIUM 9.5 8.5 9.2 8.7 8.5   Liver Function Tests:  Recent Labs Lab 07/23/13 1052 07/24/13 0530  AST 30 25  ALT 24 18  ALKPHOS 81 69  BILITOT 1.0 0.5  PROT 7.6 6.4  ALBUMIN 3.5 3.0*    Recent Labs Lab 07/23/13 1052  LIPASE 57   No results found for this basename: AMMONIA,  in the last 168 hours CBC:  Recent Labs Lab 07/23/13 1052 07/24/13 0530 07/25/13 0553 07/26/13 0552 07/27/13 0623  WBC 7.9 6.0 7.3 6.7 8.2  NEUTROABS 5.4  --   --   --  5.5  HGB 12.6* 11.1* 12.5* 11.5* 11.8*  HCT 37.2* 33.3* 36.9* 34.5* 35.7*  MCV 100.5* 103.1* 102.2* 102.1* 102.0*  PLT 209 175 219 200 202   Cardiac Enzymes: No results found for this basename: CKTOTAL, CKMB, CKMBINDEX, TROPONINI,  in the last 168 hours BNP: BNP (last 3 results) No results found for this basename: PROBNP,  in the last 8760 hours CBG:  Recent Labs Lab 07/26/13 1127 07/26/13 1707 07/26/13 2027 07/27/13 0752 07/27/13 0812  GLUCAP 186* 200* 120* 53* 72       Signed:  Christifer Chapdelaine  [facility] 07/27/2013, 4:08 PM

## 2013-07-27 NOTE — Progress Notes (Signed)
Patient given d/c instructions and prescriptions. Verbalizes understanding. IV cath removed and intact. No pain/swelling at site.

## 2013-07-28 LAB — CULTURE, BLOOD (ROUTINE X 2)
Culture: NO GROWTH
Culture: NO GROWTH

## 2013-07-29 LAB — BODY FLUID CULTURE: Culture: NO GROWTH

## 2013-08-03 ENCOUNTER — Inpatient Hospital Stay (HOSPITAL_COMMUNITY)
Admission: EM | Admit: 2013-08-03 | Discharge: 2013-08-07 | DRG: 186 | Disposition: A | Discharge status: Home Health | Payer: Medicare Other | Attending: Internal Medicine | Admitting: Internal Medicine

## 2013-08-03 ENCOUNTER — Encounter (HOSPITAL_COMMUNITY): Payer: Self-pay | Admitting: Emergency Medicine

## 2013-08-03 ENCOUNTER — Emergency Department (HOSPITAL_COMMUNITY): Payer: Medicare Other

## 2013-08-03 DIAGNOSIS — E1129 Type 2 diabetes mellitus with other diabetic kidney complication: Secondary | ICD-10-CM | POA: Diagnosis present

## 2013-08-03 DIAGNOSIS — I639 Cerebral infarction, unspecified: Secondary | ICD-10-CM

## 2013-08-03 DIAGNOSIS — J90 Pleural effusion, not elsewhere classified: Principal | ICD-10-CM | POA: Diagnosis present

## 2013-08-03 DIAGNOSIS — I2589 Other forms of chronic ischemic heart disease: Secondary | ICD-10-CM | POA: Diagnosis present

## 2013-08-03 DIAGNOSIS — I5033 Acute on chronic diastolic (congestive) heart failure: Secondary | ICD-10-CM | POA: Diagnosis present

## 2013-08-03 DIAGNOSIS — Z7982 Long term (current) use of aspirin: Secondary | ICD-10-CM

## 2013-08-03 DIAGNOSIS — Z951 Presence of aortocoronary bypass graft: Secondary | ICD-10-CM

## 2013-08-03 DIAGNOSIS — Z794 Long term (current) use of insulin: Secondary | ICD-10-CM

## 2013-08-03 DIAGNOSIS — I129 Hypertensive chronic kidney disease with stage 1 through stage 4 chronic kidney disease, or unspecified chronic kidney disease: Secondary | ICD-10-CM | POA: Diagnosis present

## 2013-08-03 DIAGNOSIS — E785 Hyperlipidemia, unspecified: Secondary | ICD-10-CM | POA: Diagnosis present

## 2013-08-03 DIAGNOSIS — R109 Unspecified abdominal pain: Secondary | ICD-10-CM

## 2013-08-03 DIAGNOSIS — E1165 Type 2 diabetes mellitus with hyperglycemia: Secondary | ICD-10-CM | POA: Diagnosis present

## 2013-08-03 DIAGNOSIS — J189 Pneumonia, unspecified organism: Secondary | ICD-10-CM

## 2013-08-03 DIAGNOSIS — K219 Gastro-esophageal reflux disease without esophagitis: Secondary | ICD-10-CM

## 2013-08-03 DIAGNOSIS — I252 Old myocardial infarction: Secondary | ICD-10-CM

## 2013-08-03 DIAGNOSIS — I2510 Atherosclerotic heart disease of native coronary artery without angina pectoris: Secondary | ICD-10-CM | POA: Diagnosis present

## 2013-08-03 DIAGNOSIS — N184 Chronic kidney disease, stage 4 (severe): Secondary | ICD-10-CM | POA: Diagnosis present

## 2013-08-03 DIAGNOSIS — E1121 Type 2 diabetes mellitus with diabetic nephropathy: Secondary | ICD-10-CM | POA: Diagnosis present

## 2013-08-03 DIAGNOSIS — Z23 Encounter for immunization: Secondary | ICD-10-CM

## 2013-08-03 DIAGNOSIS — I10 Essential (primary) hypertension: Secondary | ICD-10-CM

## 2013-08-03 DIAGNOSIS — I4891 Unspecified atrial fibrillation: Secondary | ICD-10-CM

## 2013-08-03 DIAGNOSIS — E119 Type 2 diabetes mellitus without complications: Secondary | ICD-10-CM

## 2013-08-03 DIAGNOSIS — K5900 Constipation, unspecified: Secondary | ICD-10-CM

## 2013-08-03 DIAGNOSIS — Z77090 Contact with and (suspected) exposure to asbestos: Secondary | ICD-10-CM

## 2013-08-03 DIAGNOSIS — I509 Heart failure, unspecified: Secondary | ICD-10-CM

## 2013-08-03 LAB — PRO B NATRIURETIC PEPTIDE: Pro B Natriuretic peptide (BNP): 1834 pg/mL — ABNORMAL HIGH (ref 0–125)

## 2013-08-03 LAB — CBC WITH DIFFERENTIAL/PLATELET
Basophils Absolute: 0 10*3/uL (ref 0.0–0.1)
Basophils Relative: 0 % (ref 0–1)
Hemoglobin: 12.2 g/dL — ABNORMAL LOW (ref 13.0–17.0)
MCHC: 34.1 g/dL (ref 30.0–36.0)
Monocytes Relative: 9 % (ref 3–12)
Neutro Abs: 4.8 10*3/uL (ref 1.7–7.7)
Neutrophils Relative %: 68 % (ref 43–77)
Platelets: 237 10*3/uL (ref 150–400)
RBC: 3.57 MIL/uL — ABNORMAL LOW (ref 4.22–5.81)
RDW: 12.5 % (ref 11.5–15.5)

## 2013-08-03 LAB — BASIC METABOLIC PANEL
Chloride: 99 mEq/L (ref 96–112)
GFR calc Af Amer: 47 mL/min — ABNORMAL LOW (ref >90)
GFR calc non Af Amer: 41 mL/min — ABNORMAL LOW (ref >90)
Potassium: 4.3 mEq/L (ref 3.5–5.1)
Sodium: 136 mEq/L (ref 135–145)

## 2013-08-03 LAB — GLUCOSE, CAPILLARY
Glucose-Capillary: 163 mg/dL — ABNORMAL HIGH (ref 70–99)
Glucose-Capillary: 156 mg/dL — ABNORMAL HIGH (ref 70–99)

## 2013-08-03 LAB — TROPONIN I: Troponin I: <0.3 ng/mL (ref <0.30)

## 2013-08-03 MED ORDER — HEPARIN SODIUM (PORCINE) 5000 UNIT/ML IJ SOLN
5000.0000 [IU] | Freq: Three times a day (TID) | INTRAMUSCULAR | Status: DC
  Filled 2013-08-03 (×15): qty 1

## 2013-08-03 MED ORDER — LISINOPRIL 20 MG PO TABS
20.0000 mg | ORAL_TABLET | Freq: Every day | ORAL | Status: DC
Start: 2013-08-04 — End: 2013-08-07
  Administered 2013-08-04 – 2013-08-06 (×3): 20 mg via ORAL
  Filled 2013-08-03 (×5): qty 1

## 2013-08-03 MED ORDER — SIMETHICONE 80 MG PO CHEW
80.0000 mg | CHEWABLE_TABLET | Freq: Four times a day (QID) | ORAL | Status: DC | PRN
  Administered 2013-08-04 (×2): 80 mg via ORAL
  Filled 2013-08-03 (×2): qty 1

## 2013-08-03 MED ORDER — SODIUM CHLORIDE 0.9 % IJ SOLN
3.0000 mL | Freq: Two times a day (BID) | INTRAMUSCULAR | Status: DC
  Administered 2013-08-03 – 2013-08-05 (×5): 3 mL via INTRAVENOUS

## 2013-08-03 MED ORDER — ASPIRIN 81 MG PO CHEW
CHEWABLE_TABLET | ORAL | Status: AC
  Filled 2013-08-03: qty 1

## 2013-08-03 MED ORDER — PREDNISOLONE ACETATE 1 % OP SUSP
1.0000 [drp] | Freq: Four times a day (QID) | OPHTHALMIC | Status: DC | PRN
  Filled 2013-08-03: qty 1

## 2013-08-03 MED ORDER — VITAMIN D3 25 MCG (1000 UNIT) PO TABS
5000.0000 [IU] | ORAL_TABLET | Freq: Every day | ORAL | Status: DC
  Administered 2013-08-04 – 2013-08-07 (×4): 5000 [IU] via ORAL
  Filled 2013-08-03 (×6): qty 5

## 2013-08-03 MED ORDER — METOPROLOL TARTRATE 50 MG PO TABS
50.0000 mg | ORAL_TABLET | Freq: Two times a day (BID) | ORAL | Status: DC
  Administered 2013-08-03 – 2013-08-07 (×8): 50 mg via ORAL
  Filled 2013-08-03 (×9): qty 1

## 2013-08-03 MED ORDER — INSULIN GLARGINE 100 UNIT/ML ~~LOC~~ SOLN
40.0000 [IU] | Freq: Every day | SUBCUTANEOUS | Status: DC
  Administered 2013-08-04 – 2013-08-05 (×2): 40 [IU] via SUBCUTANEOUS
  Filled 2013-08-03 (×6): qty 0.4

## 2013-08-03 MED ORDER — ASPIRIN EC 81 MG PO TBEC
81.0000 mg | DELAYED_RELEASE_TABLET | Freq: Every day | ORAL | Status: DC
  Administered 2013-08-04 – 2013-08-07 (×4): 81 mg via ORAL
  Filled 2013-08-03 (×5): qty 1

## 2013-08-03 MED ORDER — MAGNESIUM OXIDE 400 MG PO TABS
400.0000 mg | ORAL_TABLET | Freq: Every day | ORAL | Status: DC
  Administered 2013-08-04 – 2013-08-07 (×4): 400 mg via ORAL
  Filled 2013-08-03 (×5): qty 1

## 2013-08-03 MED ORDER — LUBIPROSTONE 24 MCG PO CAPS
24.0000 ug | ORAL_CAPSULE | Freq: Every day | ORAL | Status: DC
  Administered 2013-08-04 – 2013-08-07 (×4): 24 ug via ORAL
  Filled 2013-08-03 (×5): qty 1

## 2013-08-03 MED ORDER — METOCLOPRAMIDE HCL 10 MG PO TABS
10.0000 mg | ORAL_TABLET | Freq: Three times a day (TID) | ORAL | Status: DC | PRN
  Administered 2013-08-04: 11:00:00 10 mg via ORAL
  Filled 2013-08-03: qty 1

## 2013-08-03 MED ORDER — SIMVASTATIN 20 MG PO TABS
20.0000 mg | ORAL_TABLET | Freq: Every day | ORAL | Status: DC
  Administered 2013-08-04 – 2013-08-06 (×3): 20 mg via ORAL
  Filled 2013-08-03 (×4): qty 1

## 2013-08-03 MED ORDER — FUROSEMIDE 10 MG/ML IJ SOLN
40.0000 mg | Freq: Once | INTRAMUSCULAR | Status: AC
  Administered 2013-08-03: 40 mg via INTRAVENOUS
  Filled 2013-08-03: qty 4

## 2013-08-03 MED ORDER — LORAZEPAM 0.5 MG PO TABS: 0.5000 mg | ORAL_TABLET | Freq: Two times a day (BID) | ORAL | Status: DC | PRN

## 2013-08-03 NOTE — ED Provider Notes (Addendum)
CSN: [id number]     Arrival date & time 08/03/13  1317 History  This chart was scribed for Gilda Crease, * by Ardelia Mems, ED Scribe. This patient was seen in room [facility]07[facility]07 and the patient's care was started at 1:37 PM.   Chief Complaint  Patient presents with  . Chest Pain    The history is provided by the patient. No language interpreter was used.    HPI Comments: Chris [name] is a 72 y.o. male who presents to the Emergency Department complaining of intermittent central and left-sided chest pain onset today. He is unable to describe this pain. He reports associated SOB onset last night that continues today. He states that he feels like his lungs are "full". He states that he had 2 L of fluid drained from his right lung about a week ago. He states that he has had fluid drawn from his lungs one time prior to that as well. 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 He states that he has not had any recent changes in his baseline leg swelling.   Past Medical History  Diagnosis Date  . 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 Diabetes mellitus   . Macrocytosis     normal B12 & folate levels  . Ischemic cardiomyopathy     EF of 45-50% - posterior & inferior walls are severly hypokinetic  . Atrial fibrillation     not candidate for chronic anticoagulation with Coumadin given chrronic epistaxis as well as hemorrhagic conversion of his acute cva   . Hyperlipidemia   . Hypertension   . Complication of anesthesia   . PONV (postoperative nausea and vomiting)   . Acute renal insufficiency     resolved  . Myocardial infarct    Past Surgical History  Procedure Laterality Date  . Coronary artery bypass graft    . Tonsillectomy    . Esophagogastroduodenoscopy (egd) with esophageal dilation N/A 10/27/2012    Dr. Jena Gauss: distal esophageal  diverticulum, non-complete Schatzki's ring s/p dilation, distal esophageal nodule with benign path, negative Barrett's  . Cholecystectomy  2008  . Colonoscopy N/A 12/23/2012    Procedure: COLONOSCOPY;  Surgeon: Corbin Ade, MD;  Location: [facility];  Service: Endoscopy;  Laterality: N/A;  9:45  . Cardiac surgery     Family History  Problem Relation Age of Onset  . Colon cancer Neg Hx   . Colon polyps Sister   . Colon polyps Sister    History  Substance Use Topics  . Smoking status: Never Smoker   . Smokeless tobacco: Not on file  . Alcohol Use: No    Review of Systems  Respiratory: Positive for shortness of breath.   Cardiovascular: Positive for chest pain and leg swelling (baseline).  All other systems reviewed and are negative.   Allergies  Ciprofloxacin; Finasteride; and Xanax  Home Medications   Current Outpatient Rx  Name  Route  Sig  Dispense  Refill  . aspirin EC 81 MG tablet   Oral   Take 81 mg by mouth daily.         . Cholecalciferol (VITAMIN D-3) 5000 UNITS TABS   Oral   Take 5,000 Units by mouth daily.          . furosemide (LASIX) 40 MG tablet   Oral   Take 40 mg by mouth  3 (three) times daily as needed for fluid.         Marland Kitchen insulin glargine (LANTUS) 100 UNIT/ML injection   Subcutaneous   Inject 40 Units into the skin daily.          Marland Kitchen lisinopril (PRINIVIL,ZESTRIL) 20 MG tablet   Oral   Take 1 tablet by mouth daily with lunch.         Marland Kitchen LORazepam (ATIVAN) 0.5 MG tablet   Oral   Take 0.5 mg by mouth 2 (two) times daily as needed for anxiety.         . lovastatin (MEVACOR) 40 MG tablet   Oral   Take 40 mg by mouth at bedtime.           Marland Kitchen lubiprostone (AMITIZA) 24 MCG capsule   Oral   Take 1 capsule (24 mcg total) by mouth daily with breakfast.   30 capsule   0   . magnesium oxide (MAG-OX) 400 MG tablet   Oral   Take 400 mg by mouth daily.         . metoprolol (LOPRESSOR) 50 MG tablet   Oral   Take 50 mg by mouth 2  (two) times daily.           . simethicone (GAS-X) 80 MG chewable tablet   Oral   Chew 80 mg by mouth every 6 (six) hours as needed for flatulence.         Marland Kitchen azithromycin (ZITHROMAX) 250 MG tablet      For 5 days   5 each   0   . cefUROXime (CEFTIN) 500 MG tablet   Oral   Take 1 tablet (500 mg total) by mouth 2 (two) times daily with a meal. For 5 days   10 tablet   0   . metoCLOPramide (REGLAN) 10 MG tablet   Oral   Take 1 tablet by mouth 3 (three) times daily as needed (digestion problems).          . prednisoLONE acetate (PRED FORTE) 1 % ophthalmic suspension   Both Eyes   Place 1 drop into both eyes 4 (four) times daily as needed (Dry Eyes).          Triage Vitals: BP 152/66  Pulse 79  Temp(Src) 98 F (36.7 C) (Oral)  Resp 24  SpO2 98%  Physical Exam  Nursing note and vitals reviewed. Constitutional: He is oriented to person, place, and time. He appears well-developed and well-nourished. No distress.  HENT:  Head: Normocephalic and atraumatic.  Right Ear: Hearing normal.  Left Ear: Hearing normal.  Nose: Nose normal.  Mouth/Throat: Oropharynx is clear and moist and mucous membranes are normal.  Eyes: Conjunctivae and EOM are normal. Pupils are equal, round, and reactive to light.  Neck: Normal range of motion. Neck supple.  Cardiovascular: S1 normal and S2 normal.  Exam reveals no gallop and no friction rub.   No murmur heard. Heart irregular  Pulmonary/Chest: Effort normal and breath sounds normal. No respiratory distress. He exhibits no tenderness.  Abdominal: Soft. Normal appearance and bowel sounds are normal. There is no hepatosplenomegaly. There is no tenderness. There is no rebound, no guarding, no tenderness at McBurney's point and negative Murphy's sign. No hernia.  Musculoskeletal: Normal range of motion. He exhibits edema.  Bilateral leg edema (stasis dermatitis)  Neurological: He is alert and oriented to person, place, and time. He has  normal strength. No cranial nerve deficit or sensory deficit. Coordination normal. GCS  eye subscore is 4. GCS verbal subscore is 5. GCS motor subscore is 6.  Skin: Skin is warm, dry and intact. No rash noted. No cyanosis.  Psychiatric: He has a normal mood and affect. His speech is normal and behavior is normal. Thought content normal.    ED Course  Procedures (including critical care time)  DIAGNOSTIC STUDIES: Oxygen Saturation is 98% on RA, normal by my interpretation.    COORDINATION OF CARE: 1:44 PM- Pt advised of plan for treatment and pt agrees.  Labs Review Labs Reviewed  CBC WITH DIFFERENTIAL - Abnormal; Notable for the following:    RBC 3.57 (*)    Hemoglobin 12.2 (*)    HCT 35.8 (*)    MCV 100.3 (*)    MCH 34.2 (*)    All other components within normal limits  BASIC METABOLIC PANEL - Abnormal; Notable for the following:    Glucose, Bld 172 (*)    BUN 31 (*)    Creatinine, Ser 1.60 (*)    GFR calc non Af Amer 41 (*)    GFR calc Af Amer 47 (*)    All other components within normal limits  GLUCOSE, CAPILLARY - Abnormal; Notable for the following:    Glucose-Capillary 163 (*)    All other components within normal limits  PRO B NATRIURETIC PEPTIDE - Abnormal; Notable for the following:    Pro B Natriuretic peptide (BNP) 1834.0 (*)    All other components within normal limits  TROPONIN I   Imaging Review Dg Chest Portable 1 View  08/03/2013   CLINICAL DATA:  72 year old male with chest pain and fluid collecting in the right lung. Initial encounter.  EXAM: PORTABLE CHEST - 1 VIEW  COMPARISON:  08/02/2013 and earlier.  FINDINGS: Portable AP upright view at 1336 hrs. Large right pleural effusion. Subtotal opacification of the right lung. No significant interval change allowing for differences in positioning.  Stable cardiac size and mediastinal contours. Sequelae of CABG. No pneumothorax or edema. The left lung remains grossly clear.  IMPRESSION: Large right pleural  effusion, stable since yesterday.   Electronically Signed   By: Augusto Gamble M.D.   On: 08/03/2013 14:09    EKG Interpretation     Ventricular Rate:  77 PR Interval:    QRS Duration: 122 QT Interval:  410 QTC Calculation: 463 R Axis:   100 Text Interpretation:  Atrial fibrillation Rightward axis Anteroseptal infarct (cited on or before 23-Jul-2013) ST \\T \ T wave abnormality, consider inferior ischemia Abnormal ECG No significant change since last tracing            MDM  Diagnosis: 1. Right pleural effusion 2. Congestive heart failure 3. Chest pain  Patient presents to ER with complaints of progressively worsening shortness of breath and chest pain. The patient was hospitalized nearly 2 weeks ago for right pleural effusion. He had thoracentesis at that time. He was unable to get an appointment with Doctor Juanetta Gosling for followup as well as recommended. He did see Doctor Reuel Boom in the office yesterday. Because of his increased swelling, shortness of breath, and was told to double up his Lasix which he did but has not had any significant urine output. Is experiencing intermittent pain in the center and left side of his chest. He does have a history of bypass surgery, previous history of MI. EKG shows atrial fibrillation with rate control. EKG does not show any significant change from prior EKG. Troponin negative. BNP elevated over the patient's baseline. He has  some abdominal distention without tenderness. This is likely secondary to his congestive heart failure. Patient also experiencing swelling of his legs with sequela of chronic swelling/dermatitis. No concern for cellulitis.  Patient is not visibly short of breath at rest, has had progressively worsening exercise intolerance since discharge from the hospital. Patient has not responded to increased doses of Lasix at home. This is consistent with acutely decompensated congestive heart failure with reaccumulation of right pleural effusion.  Cytology of his previous thoracentesis showed significant mesothelial cells. 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  Based on the patient's intermittent chest pain with previous coronary artery disease history, increased signs and symptoms of congestive heart failure, he'll require hospitalization for further evaluation. Patient will also require management of recurrent right pleural effusion.  Addendum: Discussed with Doctor Sherrie Mustache, [name]. She recommends transfer to [facility]. She is concerned about the quick reaccumulation of right pleural effusion and possible need for cardiothoracic involvement. Will discuss with on-call hospitalist at [facility] for transfer.  I personally performed the services described in this documentation, which was scribed in my presence. The recorded information has been reviewed and is accurate.      Gilda Crease, MD 08/03/13 1545  Gilda Crease, MD 08/03/13 [phone]

## 2013-08-03 NOTE — H&P (Signed)
[facility] History and Physical  Chris [name]:[id number] DOB: 06/20/1941 DOA: 08/03/2013  Referring physician: ED PCP: Donzetta Sprung, MD   Chief Complaint: Chest pain  HPI: Chris [name] is a 72 y.o. male who presents to the ED with c/o intermittent chest pain.  Has associated SOB onset last night as well that continues today.  Last week he had a very large R pleural effusion drained, with over 2L pleural fluid out.  CXR demonstrates that the pleural effusion has recurred, he is being sent to [facility] for possible CTS evaluation.  Review of Systems: 12 systems reviewed and otherwise negative.  Past Medical History  Diagnosis Date  . 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 Diabetes mellitus   . Macrocytosis     normal B12 & folate levels  . Ischemic cardiomyopathy     EF of 45-50% - posterior & inferior walls are severly hypokinetic  . Atrial fibrillation     not candidate for chronic anticoagulation with Coumadin given chrronic epistaxis as well as hemorrhagic conversion of his acute cva   . Hyperlipidemia   . Hypertension   . Complication of anesthesia   . PONV (postoperative nausea and vomiting)   . Acute renal insufficiency     resolved  . Myocardial infarct   . CHF (congestive heart failure)    Past Surgical History  Procedure Laterality Date  . Coronary artery bypass graft    . Tonsillectomy    . Esophagogastroduodenoscopy (egd) with esophageal dilation N/A 10/27/2012    Dr. Jena Gauss: distal esophageal diverticulum, non-complete Schatzki's ring s/p dilation, distal esophageal nodule with benign path, negative Barrett's  . Cholecystectomy  2008  . Colonoscopy N/A 12/23/2012    Procedure: COLONOSCOPY;  Surgeon: Corbin Ade, MD;  [facility];  Service: Endoscopy;  Laterality: N/A;  9:45  . Cardiac surgery     Social History:  reports that he has never smoked. He has never used  smokeless tobacco. He reports that he does not drink alcohol or use illicit drugs. 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  Allergies  Allergen Reactions  . Ciprofloxacin Other (See Comments)    Sores all on legs.   . Finasteride     Affected  Patients testicles  . Xanax [Alprazolam] Other (See Comments)    Not in right state of mind.     Family History  Problem Relation Age of Onset  . Colon cancer Neg Hx   . Colon polyps Sister   . Colon polyps Sister     Prior to Admission medications   Medication Sig Start Date End Date Taking? Authorizing Provider  aspirin EC 81 MG tablet Take 81 mg by mouth daily.   Yes Historical Provider, MD  Cholecalciferol (VITAMIN D-3) 5000 UNITS TABS Take 5,000 Units by mouth daily.    Yes Historical Provider, MD  furosemide (LASIX) 40 MG tablet Take 40 mg by mouth 3 (three) times daily as needed for fluid. 07/27/13  Yes Zannie Cove, MD  insulin glargine (LANTUS) 100 UNIT/ML injection Inject 40 Units into the skin daily.  02/03/13  Yes Historical Provider, MD  lisinopril (PRINIVIL,ZESTRIL) 20 MG tablet Take 1 tablet by mouth daily with lunch. 06/28/13  Yes Historical Provider, MD  LORazepam (ATIVAN) 0.5 MG tablet Take 0.5 mg by mouth 2 (two) times daily as needed for anxiety.   Yes Historical Provider, MD  lovastatin (MEVACOR) 40 MG tablet Take  40 mg by mouth at bedtime.     Yes Historical Provider, MD  lubiprostone (AMITIZA) 24 MCG capsule Take 1 capsule (24 mcg total) by mouth daily with breakfast. 07/27/13  Yes Zannie Cove, MD  magnesium oxide (MAG-OX) 400 MG tablet Take 400 mg by mouth daily.   Yes Historical Provider, MD  metoprolol (LOPRESSOR) 50 MG tablet Take 50 mg by mouth 2 (two) times daily.     Yes Historical Provider, MD  simethicone (GAS-X) 80 MG chewable tablet Chew 80 mg by mouth every 6 (six) hours as needed for flatulence.   Yes Historical Provider, MD  azithromycin (ZITHROMAX) 250 MG tablet For 5 days 07/27/13   Zannie Cove, MD  cefUROXime (CEFTIN) 500 MG tablet Take 1 tablet (500 mg total) by mouth 2 (two) times daily with a meal. For 5 days 07/27/13   Zannie Cove, MD  metoCLOPramide (REGLAN) 10 MG tablet Take 1 tablet by mouth 3 (three) times daily as needed (digestion problems).  07/19/13   Historical Provider, MD  prednisoLONE acetate (PRED FORTE) 1 % ophthalmic suspension Place 1 drop into both eyes 4 (four) times daily as needed (Dry Eyes).    Historical Provider, MD   Physical Exam: Filed Vitals:   08/03/13 1934  BP: 150/81  Pulse: 90  Temp: 97.7 F (36.5 C)  Resp: 20    General:  NAD, resting comfortably in bed Eyes: PEERLA EOMI ENT: mucous membranes moist Neck: supple w/o JVD Cardiovascular: RRR w/o MRG Respiratory: diminished BS over R base and R middle lung field Abdomen: soft, nt, nd, bs+ Skin: no rash nor lesion Musculoskeletal: MAE, full ROM all 4 extremities Psychiatric: normal tone and affect Neurologic: AAOx3, grossly non-focal  Labs on Admission:  Basic Metabolic Panel:  Recent Labs Lab 08/03/13 1347  NA 136  K 4.3  CL 99  CO2 28  GLUCOSE 172*  BUN 31*  CREATININE 1.60*  CALCIUM 9.1   Liver Function Tests: No results found for this basename: AST, ALT, ALKPHOS, BILITOT, PROT, ALBUMIN,  in the last 168 hours No results found for this basename: LIPASE, AMYLASE,  in the last 168 hours No results found for this basename: AMMONIA,  in the last 168 hours CBC:  Recent Labs Lab 08/03/13 1347  WBC 7.0  NEUTROABS 4.8  HGB 12.2*  HCT 35.8*  MCV 100.3*  PLT 237   Cardiac Enzymes:  Recent Labs Lab 08/03/13 1347  TROPONINI <0.30    BNP (last 3 results)  Recent Labs  08/03/13 1347  PROBNP 1834.0*   CBG:  Recent Labs Lab 08/03/13 1343 08/03/13 1836  GLUCAP 163* 156*    Radiological Exams on Admission: Dg Chest Portable 1 View  08/03/2013   CLINICAL DATA:  72 year old male with chest pain and fluid collecting in the right lung. Initial  encounter.  EXAM: PORTABLE CHEST - 1 VIEW  COMPARISON:  08/02/2013 and earlier.  FINDINGS: Portable AP upright view at 1336 hrs. Large right pleural effusion. Subtotal opacification of the right lung. No significant interval change allowing for differences in positioning.  Stable cardiac size and mediastinal contours. Sequelae of CABG. No pneumothorax or edema. The left lung remains grossly clear.  IMPRESSION: Large right pleural effusion, stable since yesterday.   Electronically Signed   By: Augusto Gamble M.D.   On: 08/03/2013 14:09    EKG: Independently reviewed.  Assessment/Plan Principal Problem:   Pleural effusion, right   1. Recurrent R pleural effusion - while this could represent a new diagnosis  of CHF, I am somewhat concerned that this represents a neoplastic process and especially concerned about the possibility of mesothelioma in this patient.  Especially given the rapid recurrence despite drainage on 11/4.  He has a long exposure history to asbestos in the past, and the cytology on his pleural fluid from last week showed "atypical cells" in the form of "reactive mesothelial cells" that were present.  Likely needs CTS consult re: need for VATS?    Code Status: Full (must indicate code status--if unknown or must be presumed, indicate so) Family Communication: No family in room (indicate person spoken with, if applicable, with phone number if by telephone) Disposition Plan: Admit to inpatient (indicate anticipated LOS)  Time spent: 70 min  GARDNER, JARED M. [facility] Pager [phone]  If 7PM-7AM, please contact night-coverage [email or web address] Password [facility] 08/03/2013, 9:04 PM

## 2013-08-03 NOTE — Progress Notes (Signed)
PENDING ACCEPTANCE TRANFER NOTE:  Call received from:    Dutch Quint, in [facility]  REASON FOR REQUESTING TRANSFER:    Large right-sided pleural effusion  HPI:   72 year old with history of CHF, diabetes and atrial fibrillation. Was recently discharged from the hospital after he was treated for pneumonia and right-sided pleural effusion. Presents back to the hospital with large right-sided pleural effusion, obviously reaccumulated in less than 1 week. referred to [facility] for further management (if cardiothoracic surgery consult as needed for pleurodesis).   PLAN:  According to telephone report, this patient was accepted for transfer to [facility],   Under [facility] team:  10,  I have requested an order be written to call Flow Manager at [phone] upon patient arrival to the floor for final physician assignment who will do the admission and give admitting orders.  SIGNED: Clint Lipps, MD [facility]  08/03/2013, 4:26 PM

## 2013-08-03 NOTE — ED Notes (Signed)
Pt c/o pain to central and left chest started today. Sob started last night. Cant describe pain. No resp distress noted. Slight sob noted. Alert/oriented. C/o bloating in abd x 2 years

## 2013-08-04 DIAGNOSIS — R109 Unspecified abdominal pain: Secondary | ICD-10-CM

## 2013-08-04 LAB — PROTIME-INR: INR: 1.07 (ref 0.00–1.49)

## 2013-08-04 LAB — BASIC METABOLIC PANEL
CO2: 22 mEq/L (ref 19–32)
Calcium: 8.9 mg/dL (ref 8.4–10.5)
Chloride: 102 mEq/L (ref 96–112)
Glucose, Bld: 123 mg/dL — ABNORMAL HIGH (ref 70–99)
Sodium: 139 mEq/L (ref 135–145)

## 2013-08-04 LAB — GLUCOSE, CAPILLARY
Glucose-Capillary: 124 mg/dL — ABNORMAL HIGH (ref 70–99)
Glucose-Capillary: 109 mg/dL — ABNORMAL HIGH (ref 70–99)

## 2013-08-04 LAB — CBC
Hemoglobin: 12.4 g/dL — ABNORMAL LOW (ref 13.0–17.0)
MCH: 34.1 pg — ABNORMAL HIGH (ref 26.0–34.0)
Platelets: 239 10*3/uL (ref 150–400)
RBC: 3.64 MIL/uL — ABNORMAL LOW (ref 4.22–5.81)
WBC: 6.1 10*3/uL (ref 4.0–10.5)

## 2013-08-04 MED ORDER — FUROSEMIDE 40 MG PO TABS
40.0000 mg | ORAL_TABLET | Freq: Two times a day (BID) | ORAL | Status: DC
  Administered 2013-08-04 – 2013-08-05 (×2): 40 mg via ORAL
  Filled 2013-08-04 (×4): qty 1

## 2013-08-04 MED ORDER — INSULIN ASPART 100 UNIT/ML ~~LOC~~ SOLN
0.0000 [IU] | Freq: Three times a day (TID) | SUBCUTANEOUS | Status: DC
  Administered 2013-08-05: 17:00:00 2 [IU] via SUBCUTANEOUS
  Administered 2013-08-05 – 2013-08-06 (×2): 1 [IU] via SUBCUTANEOUS

## 2013-08-04 MED ORDER — INSULIN ASPART 100 UNIT/ML ~~LOC~~ SOLN: 0.0000 [IU] | Freq: Every day | SUBCUTANEOUS | Status: DC

## 2013-08-04 MED ORDER — FUROSEMIDE 40 MG PO TABS: 40.0000 mg | ORAL_TABLET | Freq: Two times a day (BID) | ORAL | Status: DC

## 2013-08-04 MED ORDER — CALCIUM CARBONATE ANTACID 500 MG PO CHEW
1.0000 | CHEWABLE_TABLET | Freq: Two times a day (BID) | ORAL | Status: DC
  Administered 2013-08-04 – 2013-08-07 (×7): 200 mg via ORAL
  Filled 2013-08-04 (×8): qty 1

## 2013-08-04 MED ORDER — INSULIN ASPART 100 UNIT/ML ~~LOC~~ SOLN
3.0000 [IU] | Freq: Three times a day (TID) | SUBCUTANEOUS | Status: DC
  Administered 2013-08-05 – 2013-08-06 (×4): 3 [IU] via SUBCUTANEOUS

## 2013-08-04 MED ORDER — PANTOPRAZOLE SODIUM 40 MG PO TBEC: 40.0000 mg | DELAYED_RELEASE_TABLET | Freq: Every day | ORAL | Status: DC

## 2013-08-04 MED ORDER — PANTOPRAZOLE SODIUM 40 MG PO TBEC
40.0000 mg | DELAYED_RELEASE_TABLET | Freq: Two times a day (BID) | ORAL | Status: DC
  Administered 2013-08-04 – 2013-08-07 (×7): 40 mg via ORAL
  Filled 2013-08-04 (×7): qty 1

## 2013-08-04 MED ORDER — SENNOSIDES-DOCUSATE SODIUM 8.6-50 MG PO TABS
2.0000 | ORAL_TABLET | Freq: Two times a day (BID) | ORAL | Status: DC
  Administered 2013-08-04 – 2013-08-06 (×4): 2 via ORAL
  Filled 2013-08-04 (×8): qty 2

## 2013-08-04 MED ORDER — POLYETHYLENE GLYCOL 3350 17 G PO PACK
17.0000 g | PACK | Freq: Every day | ORAL | Status: DC
  Filled 2013-08-04 (×4): qty 1

## 2013-08-04 NOTE — Consult Note (Signed)
PULMONARY  / CRITICAL CARE MEDICINE  Name: Chris [name] MRN: [id number] DOB: [birth year]    ADMISSION DATE:  08/03/2013 CONSULTATION DATE:  08/04/13  REFERRING MD :  Dr. Radonna Ricker  PRIMARY SERVICE:  [facility]  CHIEF COMPLAINT:  SOB, right pleural effusion  BRIEF PATIENT DESCRIPTION: 72 y.o. Male with insidious onset of SOB.  Was found to have right sided pleural effusion suspected transudative drained 07/26/13 at [facility].  Patient returned to ED with chest pain and SOB 11/12 and was found to have recurrent pleural effusion.  PCCM asked to consult.  SIGNIFICANT EVENTS / STUDIES:  11/4 Thoracentesis with 2 L fluid removed.  Suspected transudative ------------------------------------------------------------- 11/12 admitted 11/12 Echo >>>>   CULTURES: 11/4 Pleural fluid >> neg  ANTIBIOTICS: none  HISTORY OF PRESENT ILLNESS:  72 y.o. Male with PMH of A. Fib, HTN, DM presented to [facility] with chest pain and shortness of breath.  Patient states that he has gotten progressively more short of breath since last December.  Patient is now unable to walk around the house unless he is able to hold something without becoming very short of breath and needing to rest.  He underwent a thoracentesis on 11/4 for similar symptoms at [facility].  He was also treated for CAP with azithromycin and ceftin PO.  Patient states that he has had an echocardiogram performed in the past at [facility] and the doctor told him he had a "very weak heart that only pumps about 20%."  Patient states that he has had a non-productive cough, SOB, DOE, abdominal pain and bloating, indigestion, and mild pleuritic chest pain.  He denies anginal chest pain, edema, PND, and orthopnea.  Patient is now retired, but worked in [employer] for 40 years.  Patient states that he had direct contact with asbestos.  Patient does not smoke.     PAST MEDICAL HISTORY :  Past Medical History  Diagnosis Date  . Acute  cerebrovascular accident     presumed embolic from his atrial fibrillation, with hemorrhagic conbersion  . Diabetes mellitus   . Macrocytosis     normal B12 & folate levels  . Ischemic cardiomyopathy     EF of 45-50% - posterior & inferior walls are severly hypokinetic  . Atrial fibrillation     not candidate for chronic anticoagulation with Coumadin given chrronic epistaxis as well as hemorrhagic conversion of his acute cva   . Hyperlipidemia   . Hypertension   . Complication of anesthesia   . PONV (postoperative nausea and vomiting)   . Acute renal insufficiency     resolved  . Myocardial infarct   . CHF (congestive heart failure)    Past Surgical History  Procedure Laterality Date  . Coronary artery bypass graft    . Tonsillectomy    . Esophagogastroduodenoscopy (egd) with esophageal dilation N/A 10/27/2012    Dr. Jena Gauss: distal esophageal diverticulum, non-complete Schatzki's ring s/p dilation, distal esophageal nodule with benign path, negative Barrett's  . Cholecystectomy  2008  . Colonoscopy N/A 12/23/2012    Procedure: COLONOSCOPY;  Surgeon: Corbin Ade, MD;  [facility];  Service: Endoscopy;  Laterality: N/A;  9:45  . Cardiac surgery     Prior to Admission medications   Medication Sig Start Date End Date Taking? Authorizing Provider  aspirin EC 81 MG tablet Take 81 mg by mouth daily.   Yes Historical Provider, MD  Cholecalciferol (VITAMIN D-3) 5000 UNITS TABS Take 5,000 Units by mouth daily.  Yes Historical Provider, MD  furosemide (LASIX) 40 MG tablet Take 40 mg by mouth 3 (three) times daily as needed for fluid. 07/27/13  Yes Zannie Cove, MD  insulin glargine (LANTUS) 100 UNIT/ML injection Inject 40 Units into the skin daily.  02/03/13  Yes Historical Provider, MD  lisinopril (PRINIVIL,ZESTRIL) 20 MG tablet Take 1 tablet by mouth daily with lunch. 06/28/13  Yes Historical Provider, MD  LORazepam (ATIVAN) 0.5 MG tablet Take 0.5 mg by mouth 2 (two) times daily  as needed for anxiety.   Yes Historical Provider, MD  lovastatin (MEVACOR) 40 MG tablet Take 40 mg by mouth at bedtime.     Yes Historical Provider, MD  lubiprostone (AMITIZA) 24 MCG capsule Take 1 capsule (24 mcg total) by mouth daily with breakfast. 07/27/13  Yes Zannie Cove, MD  magnesium oxide (MAG-OX) 400 MG tablet Take 400 mg by mouth daily.   Yes Historical Provider, MD  metoprolol (LOPRESSOR) 50 MG tablet Take 50 mg by mouth 2 (two) times daily.     Yes Historical Provider, MD  simethicone (GAS-X) 80 MG chewable tablet Chew 80 mg by mouth every 6 (six) hours as needed for flatulence.   Yes Historical Provider, MD  azithromycin (ZITHROMAX) 250 MG tablet For 5 days 07/27/13   Zannie Cove, MD  cefUROXime (CEFTIN) 500 MG tablet Take 1 tablet (500 mg total) by mouth 2 (two) times daily with a meal. For 5 days 07/27/13   Zannie Cove, MD  metoCLOPramide (REGLAN) 10 MG tablet Take 1 tablet by mouth 3 (three) times daily as needed (digestion problems).  07/19/13   Historical Provider, MD  prednisoLONE acetate (PRED FORTE) 1 % ophthalmic suspension Place 1 drop into both eyes 4 (four) times daily as needed (Dry Eyes).    Historical Provider, MD   Allergies  Allergen Reactions  . Ciprofloxacin Other (See Comments)    Sores all on legs.   . Finasteride     Affected  Patients testicles  . Xanax [Alprazolam] Other (See Comments)    Not in right state of mind.     FAMILY HISTORY:  Family History  Problem Relation Age of Onset  . Colon cancer Neg Hx   . Colon polyps Sister   . Colon polyps Sister    SOCIAL HISTORY:  reports that he has never smoked. He has never used smokeless tobacco. He reports that he does not drink alcohol or use illicit drugs.  REVIEW OF SYSTEMS:   See HPI  SUBJECTIVE:   VITAL SIGNS: Temp:  [97.2 F (36.2 C)-98 F (36.7 C)] 98 F (36.7 C) (11/13 1043) Pulse Rate:  [71-90] 90 (11/13 1043) Resp:  [14-24] 16 (11/13 1043) BP: (108-150)/(49-81) 108/64 mmHg  (11/13 1043) SpO2:  [95 %-99 %] 98 % (11/13 1043) Weight:  [223 lb 1.6 oz (101.197 kg)-225 lb 4.8 oz (102.195 kg)] 223 lb 1.6 oz (101.197 kg) (11/13 1043)  PHYSICAL EXAMINATION: General:  Pleasant, obese white male, NAD Neuro:  A&Ox4, PERL, appropriate conversational skills, moves extremities x 4 HEENT:  NCAT, oropharynx clear, MM moist and pink.  Fair dentition Neck:  No obvious JVD Cardiovascular:  RRR, No appreciable M/R/G Lungs:  Diminished breath sounds in right middle and lower lobe, Soft fine crackles heard in R upper lobe.  Abdomen:  Protuberant, +BS, soft, non-tender Musculoskeletal:  Moves extremities x4, Mild edema, no clubbing Skin:  No rashes or noted breakdown   Recent Labs Lab 08/03/13 1347 08/04/13 0515  NA 136 139  K 4.3 3.6  CL 99 102  CO2 28 22  BUN 31* 31*  CREATININE 1.60* 1.63*  GLUCOSE 172* 123*    Recent Labs Lab 08/03/13 1347 08/04/13 0515  HGB 12.2* 12.4*  HCT 35.8* 35.8*  WBC 7.0 6.1  PLT 237 239   Dg Chest Portable 1 View  08/03/2013   CLINICAL DATA:  72 year old male with chest pain and fluid collecting in the right lung. Initial encounter.  EXAM: PORTABLE CHEST - 1 VIEW  COMPARISON:  08/02/2013 and earlier.  FINDINGS: Portable AP upright view at 1336 hrs. Large right pleural effusion. Subtotal opacification of the right lung. No significant interval change allowing for differences in positioning.  Stable cardiac size and mediastinal contours. Sequelae of CABG. No pneumothorax or edema. The left lung remains grossly clear.  IMPRESSION: Large right pleural effusion, stable since yesterday.   Electronically Signed   By: Augusto Gamble M.D.   On: 08/03/2013 14:09    ASSESSMENT / PLAN:  Recurrent right pleural effusion:  Differential includes transudative process (likely CHF due to leg swelling and elevated BNP) versus exudative process.  Thora done 11/4 was not sent for protein analysis.    - Echo today - Thoracentsis pending results from Echo -  If thora will send pleural fluid off for cytology, cells with dif, glucose, protein, and LDH.    Suspect that this effusion is likely due to CHF, however there have been several notes stating that mesothelial reactive cells were found in pleural fluid.  Not likely an active issue.  Would diurese since patient is in no distress, thora only if in respiratory distress since previous fluid was transudative.  Will follow up.  Alyson Reedy, M.D. Pulmonary and Critical Care Medicine [facility] Pager: [phone]  08/04/2013, 1:44 PM

## 2013-08-04 NOTE — Progress Notes (Signed)
[facility] PROGRESS NOTE   Assessment/Plan: Pleural effusion, right: - recurrent completed a course of antibiotic ? Effusion due to swill consult pulmonary. - last time most consistent with transudate. - consult PCCM, ? Mild need CVTS. - echo pending, last echo 2009 showed EF 55%.   Code Status: Full  Family Communication: No family in room  Disposition Plan: Admit to inpatient    Consultants:  PCCM  Procedures:  Thoracocentesis 11.13.2014    Antibiotics:  None  HPI/Subjective: Relates his SOB is unchanged.  Objective: Filed Vitals:   08/03/13 1934 08/04/13 0151 08/04/13 0555 08/04/13 1043  BP: 150/81 118/49 134/58 108/64  Pulse: 90 76 83 90  Temp: 97.7 F (36.5 C) 97.2 F (36.2 C) 97.7 F (36.5 C) 98 F (36.7 C)  TempSrc: Oral Oral Oral Oral  Resp: 20 18 16 16   Height: 6\' 2"  (1.88 m)   6\' 2"  (1.88 m)  Weight: 102.195 kg (225 lb 4.8 oz)  101.197 kg (223 lb 1.6 oz) 101.197 kg (223 lb 1.6 oz)  SpO2: 96% 97% 95% 98%    Intake/Output Summary (Last 24 hours) at 08/04/13 1129 Last data filed at 08/04/13 1000  Gross per 24 hour  Intake    360 ml  Output   1550 ml  Net  -1190 ml   Filed Weights   08/03/13 1934 08/04/13 0555 08/04/13 1043  Weight: 102.195 kg (225 lb 4.8 oz) 101.197 kg (223 lb 1.6 oz) 101.197 kg (223 lb 1.6 oz)    Exam:  General: Alert, awake, oriented x3, in no acute distress.  HEENT: No bruits, no goiter.  Heart: Regular rate and rhythm, without murmurs, rubs, gallops.  Lungs: Good air movement, bilateral air movement.  Abdomen: Soft, nontender, nondistended, positive bowel sounds.  Neuro: Grossly intact, nonfocal.   Data Reviewed: Basic Metabolic Panel:  Recent Labs Lab 08/03/13 1347 08/04/13 0515  NA 136 139  K 4.3 3.6  CL 99 102  CO2 28 22  GLUCOSE 172* 123*  BUN 31* 31*  CREATININE 1.60* 1.63*  CALCIUM 9.1 8.9   Liver Function Tests: No results found for this basename: AST, ALT, ALKPHOS, BILITOT, PROT,  ALBUMIN,  in the last 168 hours No results found for this basename: LIPASE, AMYLASE,  in the last 168 hours No results found for this basename: AMMONIA,  in the last 168 hours CBC:  Recent Labs Lab 08/03/13 1347 08/04/13 0515  WBC 7.0 6.1  NEUTROABS 4.8  --   HGB 12.2* 12.4*  HCT 35.8* 35.8*  MCV 100.3* 98.4  PLT 237 239   Cardiac Enzymes:  Recent Labs Lab 08/03/13 1347  TROPONINI <0.30   BNP (last 3 results)  Recent Labs  08/03/13 1347  PROBNP 1834.0*   CBG:  Recent Labs Lab 08/03/13 1343 08/03/13 1836 08/03/13 2103 08/04/13 0622 08/04/13 1106  GLUCAP 163* 156* 124* 109* 199*    Recent Results (from the past 240 hour(s))  BODY FLUID CULTURE     Status: None   Collection Time    07/26/13  8:30 AM      Result Value Range Status   Specimen Description FLUID LEFT PLEURAL   Final   Special Requests NONE   Final   Gram Stain     Final   Value: RARE WBC PRESENT,BOTH PMN AND MONONUCLEAR     NO ORGANISMS SEEN     Performed at [facility]   Culture     Final   Value: NO GROWTH 3 DAYS  Performed at [facility]   Report Status 07/29/2013 FINAL   Final     Studies: Dg Chest Portable 1 View  08/03/2013   CLINICAL DATA:  72 year old male with chest pain and fluid collecting in the right lung. Initial encounter.  EXAM: PORTABLE CHEST - 1 VIEW  COMPARISON:  08/02/2013 and earlier.  FINDINGS: Portable AP upright view at 1336 hrs. Large right pleural effusion. Subtotal opacification of the right lung. No significant interval change allowing for differences in positioning.  Stable cardiac size and mediastinal contours. Sequelae of CABG. No pneumothorax or edema. The left lung remains grossly clear.  IMPRESSION: Large right pleural effusion, stable since yesterday.   Electronically Signed   By: Augusto Gamble M.D.   On: 08/03/2013 14:09    Scheduled Meds: . aspirin EC  81 mg Oral Daily  . calcium carbonate  1 tablet Oral BID  . cholecalciferol  5,000  Units Oral Daily  . heparin  5,000 Units Subcutaneous Q8H  . insulin glargine  40 Units Subcutaneous Daily  . lisinopril  20 mg Oral Q lunch  . lubiprostone  24 mcg Oral Q breakfast  . magnesium oxide  400 mg Oral Daily  . metoprolol  50 mg Oral BID  . simvastatin  20 mg Oral q1800  . sodium chloride  3 mL Intravenous Q12H   Continuous Infusions:    Marinda Elk  [facility] Pager [phone]. If 8PM-8AM, please contact night-coverage at [email or web address], password [facility] 08/04/2013, 11:29 AM  LOS: 1 day

## 2013-08-04 NOTE — Progress Notes (Signed)
Utilization review completed. Cedar Roseman, RN, BSN. 

## 2013-08-05 DIAGNOSIS — E119 Type 2 diabetes mellitus without complications: Secondary | ICD-10-CM

## 2013-08-05 DIAGNOSIS — I509 Heart failure, unspecified: Secondary | ICD-10-CM

## 2013-08-05 DIAGNOSIS — I10 Essential (primary) hypertension: Secondary | ICD-10-CM

## 2013-08-05 DIAGNOSIS — I359 Nonrheumatic aortic valve disorder, unspecified: Secondary | ICD-10-CM

## 2013-08-05 LAB — BASIC METABOLIC PANEL
BUN: 39 mg/dL — ABNORMAL HIGH (ref 6–23)
CO2: 25 mEq/L (ref 19–32)
Chloride: 102 mEq/L (ref 96–112)
Creatinine, Ser: 2.02 mg/dL — ABNORMAL HIGH (ref 0.50–1.35)
GFR calc Af Amer: 36 mL/min — ABNORMAL LOW (ref >90)
Glucose, Bld: 123 mg/dL — ABNORMAL HIGH (ref 70–99)
Potassium: 4.1 mEq/L (ref 3.5–5.1)
Sodium: 140 mEq/L (ref 135–145)

## 2013-08-05 LAB — GLUCOSE, CAPILLARY
Glucose-Capillary: 134 mg/dL — ABNORMAL HIGH (ref 70–99)
Glucose-Capillary: 164 mg/dL — ABNORMAL HIGH (ref 70–99)

## 2013-08-05 LAB — HEMOGLOBIN A1C: Mean Plasma Glucose: 186 mg/dL — ABNORMAL HIGH (ref <117)

## 2013-08-05 MED ORDER — FUROSEMIDE 40 MG PO TABS
40.0000 mg | ORAL_TABLET | Freq: Once | ORAL | Status: DC
  Filled 2013-08-05: qty 1

## 2013-08-05 MED ORDER — FUROSEMIDE 10 MG/ML IJ SOLN
80.0000 mg | Freq: Two times a day (BID) | INTRAMUSCULAR | Status: DC
  Administered 2013-08-05 – 2013-08-06 (×3): 80 mg via INTRAVENOUS
  Filled 2013-08-05 (×6): qty 8

## 2013-08-05 MED ORDER — FUROSEMIDE 80 MG PO TABS
80.0000 mg | ORAL_TABLET | Freq: Two times a day (BID) | ORAL | Status: DC
  Filled 2013-08-05 (×2): qty 1

## 2013-08-05 NOTE — Progress Notes (Signed)
Feels better No distress Able to lay flat without distress  Filed Vitals:   08/05/13 0504 08/05/13 0922 08/05/13 1143 08/05/13 1404  BP: 123/55 124/50 128/56 133/62  Pulse: 66  86 90  Temp: 97.9 F (36.6 C) 97.4 F (36.3 C)  97.6 F (36.4 C)  TempSrc: Oral Oral  Oral  Resp: 18 20  20   Height:      Weight: 100.517 kg (221 lb 9.6 oz)     SpO2: 97% 98%  96%    NAD JVP not well visualized Dull with diminished BS and bronchial BS in RLL aprox 1/3 up RRR s M NABS Chronic stasis changes and LE edema  I have reviewed all of today's lab results. Relevant abnormalities are discussed in the A/P section  CXR: NNF   IMPRESSION: R pleural effusion - transudate by chemistries on prior thoracentesis Symptoms overall improved with diuresis   PLAN: Recheck CXR and follow effusion intermittently while diuresing/optimizing mgmt of CHF If medically optimized and effusion not radiographically improved, we can consider repeat thoracentesis   PCCM will sign off. Please call if we can be of further assistance  Billy Fischer, MD ; [facility] [phone].  After 5:30 PM or weekends, call [phone]

## 2013-08-05 NOTE — Progress Notes (Signed)
Patient c/o feeling like he had a low blood sugar.  Blood sugar obtained x 1.  CBG = 134. RN will continue to monitor. Louretta Parma, RN

## 2013-08-05 NOTE — Progress Notes (Signed)
Echo Lab  2D Echocardiogram completed.  Casimer Russett L Shamari Lofquist, RDCS 08/05/2013 11:28 AM

## 2013-08-05 NOTE — Progress Notes (Addendum)
[facility] PROGRESS NOTE   Assessment/Plan: Acute heart failure and Pleural effusion, right: - Recurrent completed a course of antibiotic. - lLast time most consistent with transudate. - Echo pending, last echo 2009 showed EF 55%.  - Change lasix to IV, has JVD and lower extremity edema.  Diabetic nephropathy: - At baseline cont lisinopril.  Uncontrolled DM: - On Lantus plus SSI. - Improved controlled.  Code Status: Full  Family Communication: No family in room  Disposition Plan: Admit to inpatient    Consultants:  PCCM  Procedures:  Thoracocentesis 11.13.2014    Antibiotics:  None  HPI/Subjective: Relates his SOB is unchanged. - he is a very poor historian  Objective: Filed Vitals:   08/04/13 1448 08/04/13 2059 08/05/13 0504 08/05/13 0922  BP: 106/45 133/63 123/55 124/50  Pulse: 75 91 66   Temp: 98.3 F (36.8 C) 97.4 F (36.3 C) 97.9 F (36.6 C) 97.4 F (36.3 C)  TempSrc: Oral Oral Oral Oral  Resp: 18 18 18 20   Height:      Weight:   100.517 kg (221 lb 9.6 oz)   SpO2: 96% 99% 97% 98%    Intake/Output Summary (Last 24 hours) at 08/05/13 1003 Last data filed at 08/05/13 0910  Gross per 24 hour  Intake    630 ml  Output    300 ml  Net    330 ml   Filed Weights   08/04/13 0555 08/04/13 1043 08/05/13 0504  Weight: 101.197 kg (223 lb 1.6 oz) 101.197 kg (223 lb 1.6 oz) 100.517 kg (221 lb 9.6 oz)    Exam:  General: Alert, awake, oriented x3, in no acute distress.  HEENT: No bruits, no goiter. + JVD Heart: Regular rate and rhythm, woody edema Lungs: Good air movement, bilateral air movement.  Abdomen: Soft, nontender, nondistended, positive bowel sounds.     Data Reviewed: Basic Metabolic Panel:  Recent Labs Lab 08/03/13 1347 08/04/13 0515 08/05/13 0425  NA 136 139 140  K 4.3 3.6 4.1  CL 99 102 102  CO2 28 22 25   GLUCOSE 172* 123* 123*  BUN 31* 31* 39*  CREATININE 1.60* 1.63* 2.02*  CALCIUM 9.1 8.9 8.7   Liver Function  Tests: No results found for this basename: AST, ALT, ALKPHOS, BILITOT, PROT, ALBUMIN,  in the last 168 hours No results found for this basename: LIPASE, AMYLASE,  in the last 168 hours No results found for this basename: AMMONIA,  in the last 168 hours CBC:  Recent Labs Lab 08/03/13 1347 08/04/13 0515  WBC 7.0 6.1  NEUTROABS 4.8  --   HGB 12.2* 12.4*  HCT 35.8* 35.8*  MCV 100.3* 98.4  PLT 237 239   Cardiac Enzymes:  Recent Labs Lab 08/03/13 1347  TROPONINI <0.30   BNP (last 3 results)  Recent Labs  08/03/13 1347  PROBNP 1834.0*   CBG:  Recent Labs Lab 08/04/13 1106 08/04/13 1612 08/04/13 2118 08/05/13 0412 08/05/13 0615  GLUCAP 199* 307* 340* 134* 145*    No results found for this or any previous visit (from the past 240 hour(s)).   Studies: Dg Chest Portable 1 View  08/03/2013   CLINICAL DATA:  72 year old male with chest pain and fluid collecting in the right lung. Initial encounter.  EXAM: PORTABLE CHEST - 1 VIEW  COMPARISON:  08/02/2013 and earlier.  FINDINGS: Portable AP upright view at 1336 hrs. Large right pleural effusion. Subtotal opacification of the right lung. No significant interval change allowing for differences in positioning.  Stable  cardiac size and mediastinal contours. Sequelae of CABG. No pneumothorax or edema. The left lung remains grossly clear.  IMPRESSION: Large right pleural effusion, stable since yesterday.   Electronically Signed   By: Augusto Gamble M.D.   On: 08/03/2013 14:09    Scheduled Meds: . aspirin EC  81 mg Oral Daily  . calcium carbonate  1 tablet Oral BID  . cholecalciferol  5,000 Units Oral Daily  . furosemide  40 mg Oral BID  . heparin  5,000 Units Subcutaneous Q8H  . insulin aspart  0-5 Units Subcutaneous QHS  . insulin aspart  0-9 Units Subcutaneous TID WC  . insulin aspart  3 Units Subcutaneous TID WC  . insulin glargine  40 Units Subcutaneous Daily  . lisinopril  20 mg Oral Q lunch  . lubiprostone  24 mcg Oral Q  breakfast  . magnesium oxide  400 mg Oral Daily  . metoprolol  50 mg Oral BID  . pantoprazole  40 mg Oral BID  . polyethylene glycol  17 g Oral Daily  . senna-docusate  2 tablet Oral BID  . simvastatin  20 mg Oral q1800  . sodium chloride  3 mL Intravenous Q12H   Continuous Infusions:    Marinda Elk  [facility] Pager [phone]. If 8PM-8AM, please contact night-coverage at [email or web address], password [facility] 08/05/2013, 10:03 AM  LOS: 2 days

## 2013-08-06 ENCOUNTER — Inpatient Hospital Stay (HOSPITAL_COMMUNITY): Payer: Medicare Other

## 2013-08-06 DIAGNOSIS — E1121 Type 2 diabetes mellitus with diabetic nephropathy: Secondary | ICD-10-CM | POA: Diagnosis present

## 2013-08-06 DIAGNOSIS — I509 Heart failure, unspecified: Secondary | ICD-10-CM

## 2013-08-06 DIAGNOSIS — N184 Chronic kidney disease, stage 4 (severe): Secondary | ICD-10-CM | POA: Diagnosis present

## 2013-08-06 LAB — GLUCOSE, CAPILLARY
Glucose-Capillary: 74 mg/dL (ref 70–99)
Glucose-Capillary: 118 mg/dL — ABNORMAL HIGH (ref 70–99)
Glucose-Capillary: 209 mg/dL — ABNORMAL HIGH (ref 70–99)
Glucose-Capillary: 146 mg/dL — ABNORMAL HIGH (ref 70–99)
Glucose-Capillary: 52 mg/dL — ABNORMAL LOW (ref 70–99)
Glucose-Capillary: 80 mg/dL (ref 70–99)

## 2013-08-06 LAB — CBC
Hemoglobin: 13 g/dL (ref 13.0–17.0)
MCH: 34.3 pg — ABNORMAL HIGH (ref 26.0–34.0)
MCV: 100.5 fL — ABNORMAL HIGH (ref 78.0–100.0)
Platelets: 213 10*3/uL (ref 150–400)
RBC: 3.79 MIL/uL — ABNORMAL LOW (ref 4.22–5.81)
WBC: 7 10*3/uL (ref 4.0–10.5)

## 2013-08-06 LAB — HEPATIC FUNCTION PANEL
ALT: 24 U/L (ref 0–53)
Bilirubin, Direct: 0.2 mg/dL (ref 0.0–0.3)
Indirect Bilirubin: 0.5 mg/dL (ref 0.3–0.9)
Total Protein: 7.5 g/dL (ref 6.0–8.3)

## 2013-08-06 LAB — BASIC METABOLIC PANEL
CO2: 24 mEq/L (ref 19–32)
Calcium: 9 mg/dL (ref 8.4–10.5)
Chloride: 102 mEq/L (ref 96–112)
Creatinine, Ser: 2.25 mg/dL — ABNORMAL HIGH (ref 0.50–1.35)
GFR calc Af Amer: 31 mL/min — ABNORMAL LOW (ref >90)
Sodium: 140 mEq/L (ref 135–145)

## 2013-08-06 MED ORDER — GLUCOSE 40 % PO GEL
ORAL | Status: AC
  Administered 2013-08-06: 19:00:00 37.5 g
  Filled 2013-08-06: qty 1

## 2013-08-06 MED ORDER — INSULIN DETEMIR 100 UNIT/ML ~~LOC~~ SOLN
40.0000 [IU] | Freq: Every day | SUBCUTANEOUS | Status: DC
  Administered 2013-08-06 – 2013-08-07 (×2): 40 [IU] via SUBCUTANEOUS
  Filled 2013-08-06 (×2): qty 0.4

## 2013-08-06 MED ORDER — GLUCOSE 40 % PO GEL
ORAL | Status: AC
  Filled 2013-08-06: qty 1

## 2013-08-06 MED ORDER — DEXTROSE 50 % IV SOLN: 25.0000 mL | Freq: Once | INTRAVENOUS | Status: DC

## 2013-08-06 MED ORDER — POLYETHYLENE GLYCOL 3350 17 G PO PACK: 17.0000 g | PACK | Freq: Every day | ORAL | Status: DC

## 2013-08-06 MED ORDER — IOHEXOL 300 MG/ML  SOLN
25.0000 mL | INTRAMUSCULAR | Status: AC
  Administered 2013-08-06 (×2): 25 mL via ORAL

## 2013-08-06 NOTE — Progress Notes (Signed)
Patient's stated he felt like his sugar was low. Checked CBG and result was 52. Graham crackers and peanut butter and orange juice given. Will recheck blood sugar in about 15 min.

## 2013-08-06 NOTE — Progress Notes (Signed)
Rechecked CBG and result was 58. One gel tube of glucose given to patient per protocol. Rechecked CBG after 15-76min after glucose gel given and result was 64. Attempted to give another dose of tube of glucose gel and patient refused for he states that his sugar will be too high. Explained the risk and educated patient of what could happen if glucose gel not given such as going into a coma or death. Patient understood and continued to refuse the glucose gel tube. Night nurse aware and has called the on-call doctor. Night nurse will continue to monitor to end of shift.

## 2013-08-06 NOTE — Progress Notes (Signed)
Pt's Blood glucose= 65 after oral glucose gel given, attempted to give another glucose gel as pt refused to drink orange juice and graham crackers but pt refused, advised the consequences of him not taking the oral gel but pt still refused. Benedetto Coons NP notified.

## 2013-08-06 NOTE — Progress Notes (Signed)
Hypoglycemic Event  CBG: 65   Treatment: pt refused  Symptoms: weakness  Follow-up CBG: Time:2025 CBG Result:74  Possible Reasons for Event: levemir 40 units given at lunch time  Comments/MD notified:T. Claiborne Billings NP    Chris [name]  Remember to initiate Hypoglycemia Order Set & complete

## 2013-08-06 NOTE — Progress Notes (Addendum)
[facility] PROGRESS NOTE   Assessment/Plan: Acute undeterminate heart failure and Pleural effusion, right: - Recurrent right pleural effusion, completed a course of antibiotic last time he was on the hospital. - Last time most consistent with transudate. - Echo unchanged, last echo 2009 showed EF 55%.  - Pulmonary consulted, Rec. maximize HF regimen. Cont IV lasix. - B-met daily.  Abdominal pain: - Unclear etiology. - Check hepatic function panel, ct abdomen and pelvis without contrast - On PPI. Check B12 and RBC folate.  Diabetic nephropathy/CKD IV: - Con ACE-I. - Baseline Cr 2.6: - At baseline cont lisinopril.  Uncontrolled DM: - On Lantus plus SSI. - Improved controlled.    Code Status: Full  Family Communication: No family in room  Disposition Plan: Admit to inpatient    Consultants:  PCCM  Procedures:  none  Antibiotics:  None  HPI/Subjective: - Relates his SOB is unchanged. - He is a very poor historian  Objective: Filed Vitals:   08/05/13 1404 08/05/13 2106 08/06/13 0625 08/06/13 0904  BP: 133/62 138/63 143/52 116/54  Pulse: 90 77 82 84  Temp: 97.6 F (36.4 C) 98 F (36.7 C) 97.3 F (36.3 C) 98.1 F (36.7 C)  TempSrc: Oral Oral Oral Oral  Resp: 20 18 18    Height:      Weight:   98.7 kg (217 lb 9.5 oz)   SpO2: 96% 100% 97%     Intake/Output Summary (Last 24 hours) at 08/06/13 1029 Last data filed at 08/06/13 1002  Gross per 24 hour  Intake   1080 ml  Output   2150 ml  Net  -1070 ml   Filed Weights   08/04/13 1043 08/05/13 0504 08/06/13 0625  Weight: 101.197 kg (223 lb 1.6 oz) 100.517 kg (221 lb 9.6 oz) 98.7 kg (217 lb 9.5 oz)    Exam:  General: Alert, awake, oriented x3, in no acute distress.  HEENT: No bruits, no goiter. + JVD Heart: Regular rate and rhythm, woody edema Lungs: Good air movement, bilateral air movement.  Abdomen: Soft, nontender, nondistended, positive bowel sounds.     Data Reviewed: Basic  Metabolic Panel:  Recent Labs Lab 08/03/13 1347 08/04/13 0515 08/05/13 0425 08/06/13 0538  NA 136 139 140 140  K 4.3 3.6 4.1 3.6  CL 99 102 102 102  CO2 28 22 25 24   GLUCOSE 172* 123* 123* 103*  BUN 31* 31* 39* 39*  CREATININE 1.60* 1.63* 2.02* 2.25*  CALCIUM 9.1 8.9 8.7 9.0   Liver Function Tests: No results found for this basename: AST, ALT, ALKPHOS, BILITOT, PROT, ALBUMIN,  in the last 168 hours No results found for this basename: LIPASE, AMYLASE,  in the last 168 hours No results found for this basename: AMMONIA,  in the last 168 hours CBC:  Recent Labs Lab 08/03/13 1347 08/04/13 0515 08/06/13 0538  WBC 7.0 6.1 7.0  NEUTROABS 4.8  --   --   HGB 12.2* 12.4* 13.0  HCT 35.8* 35.8* 38.1*  MCV 100.3* 98.4 100.5*  PLT 237 239 213   Cardiac Enzymes:  Recent Labs Lab 08/03/13 1347  TROPONINI <0.30   BNP (last 3 results)  Recent Labs  08/03/13 1347  PROBNP 1834.0*   CBG:  Recent Labs Lab 08/05/13 1125 08/05/13 1533 08/05/13 2114 08/06/13 0435 08/06/13 0617  GLUCAP 98 164* 150* 74 118*    No results found for this or any previous visit (from the past 240 hour(s)).   Studies: Dg Chest 2 View  08/06/2013  CLINICAL DATA:  Right pleural effusion  EXAM: CHEST  2 VIEW  COMPARISON:  08/03/2013  FINDINGS: Persistent large right pleural effusion with associated right middle and lower lobe collapse/consolidation. Prior coronary bypass changes noted. Stable heart size and vascularity. Left lung remains clear. No pneumothorax.  IMPRESSION: Persistent large right pleural effusion.  No interval change.   Electronically Signed   By: Ruel Favors M.D.   On: 08/06/2013 07:46    Scheduled Meds: . aspirin EC  81 mg Oral Daily  . calcium carbonate  1 tablet Oral BID  . cholecalciferol  5,000 Units Oral Daily  . furosemide  80 mg Intravenous Q12H  . heparin  5,000 Units Subcutaneous Q8H  . insulin aspart  0-5 Units Subcutaneous QHS  . insulin aspart  0-9 Units  Subcutaneous TID WC  . insulin aspart  3 Units Subcutaneous TID WC  . insulin detemir  40 Units Subcutaneous Daily  . lisinopril  20 mg Oral Q lunch  . lubiprostone  24 mcg Oral Q breakfast  . magnesium oxide  400 mg Oral Daily  . metoprolol  50 mg Oral BID  . pantoprazole  40 mg Oral BID  . polyethylene glycol  17 g Oral Daily  . senna-docusate  2 tablet Oral BID  . simvastatin  20 mg Oral q1800  . sodium chloride  3 mL Intravenous Q12H   Continuous Infusions:    Marinda Elk  [facility] Pager [phone]. If 8PM-8AM, please contact night-coverage at [email or web address], password [facility] 08/06/2013, 10:29 AM  LOS: 3 days

## 2013-08-07 DIAGNOSIS — I4891 Unspecified atrial fibrillation: Secondary | ICD-10-CM

## 2013-08-07 LAB — BASIC METABOLIC PANEL
CO2: 29 mEq/L (ref 19–32)
Calcium: 8.7 mg/dL (ref 8.4–10.5)
Chloride: 100 mEq/L (ref 96–112)
GFR calc Af Amer: 30 mL/min — ABNORMAL LOW (ref >90)
Glucose, Bld: 68 mg/dL — ABNORMAL LOW (ref 70–99)
Sodium: 140 mEq/L (ref 135–145)

## 2013-08-07 LAB — GLUCOSE, CAPILLARY: Glucose-Capillary: 74 mg/dL (ref 70–99)

## 2013-08-07 MED ORDER — PANTOPRAZOLE SODIUM 40 MG PO TBEC: 40.0000 mg | DELAYED_RELEASE_TABLET | Freq: Two times a day (BID) | ORAL | Status: DC

## 2013-08-07 MED ORDER — FUROSEMIDE 80 MG PO TABS
80.0000 mg | ORAL_TABLET | Freq: Two times a day (BID) | ORAL | Status: DC
  Administered 2013-08-07: 80 mg via ORAL
  Filled 2013-08-07 (×4): qty 1

## 2013-08-07 MED ORDER — FUROSEMIDE 80 MG PO TABS: 80.0000 mg | ORAL_TABLET | Freq: Two times a day (BID) | ORAL | Status: DC

## 2013-08-07 MED ORDER — INSULIN DETEMIR 100 UNIT/ML FLEXPEN: 40.0000 [IU] | PEN_INJECTOR | Freq: Every day | SUBCUTANEOUS | Status: DC

## 2013-08-07 NOTE — Discharge Summary (Signed)
Physician Discharge Summary  Chris [name] VHQ:[id number] DOB: 20-Jun-1941 DOA: 08/03/2013  PCP: Donzetta Sprung, MD  Admit date: 08/03/2013 Discharge date: 08/07/2013  Time spent: 40 minutes  Recommendations for Outpatient Follow-up:  1. Follow up with PCP in 1-2 weeks. Check CBG's. Refer to GI as an outpatient.  2. Follow up with cardiologist in 1 week check weight titrate meds as tolerate it. Will need to d/w pt the need for anticoagulation, this was d/w patient and he will like to think about it.   BNP    Component Value Date/Time   PROBNP 1834.0* 08/03/2013 1347   Filed Weights   08/05/13 0504 08/06/13 0625 08/07/13 0519  Weight: 100.517 kg (221 lb 9.6 oz) 98.7 kg (217 lb 9.5 oz) 99.6 kg (219 lb 9.3 oz)     Discharge Diagnoses:  Principal Problem:   Pleural effusion, right Active Problems:   Chronic kidney disease (CKD), stage IV (severe)   Diabetic nephropathy   Acute on chronic heart failure of undertermine significance   Discharge Condition: Stable  Diet recommendation: low sodium. Fluid restricted diet   History of present illness:  72 y.o. male who presents to the ED with c/o intermittent chest pain. Has associated SOB onset last night as well that continues today. Last week he had a very large R pleural effusion drained, with over 2L pleural fluid out. CXR demonstrates that the pleural effusion has recurred, he is being sent to [facility] for possible CTS evaluation.   Hospital Course:  Acute undeterminate heart failure and Pleural effusion, right:  - Recurrent right pleural effusion, completed a course of antibiotic last time he was on the hospital.  - Last time most consistent with transudate.  - Echo unchanged, last echo 2009 showed EF 55%.  - Pulmonary consulted, Rec. maximize HF regimen. Lasix was increase diureses well. - B-met were follow  Daily and electrolytes remained stable. - his baseline Cr 2.2-2.6.  Atrial fibrillation: - Rate controlled. - On ASA  for now. - Needs further evaluation of his Gi upset as an outpatient. - D/w pt he relates he will like to think about it.  Abdominal pain:  - Unclear etiology.  - hepatic function panel with normal. - On PPI. Check B12 and RBC folate.  - started on PPI empirically. - Ct abdomen showed 11.16.2014: The wall of the distal gastric antrum appears thickened, but this  is stable. PCP to refer to GI.  Diabetic nephropathy/CKD IV:  - Con ACE-I.  - Baseline Cr 2.6  - At baseline cont lisinopril.   Uncontrolled DM:  - On Levemir. Hbg A1c 8.1  - Improved controlled.     Procedures:  CXR  CT abd and pelvis as below Echo: estimated ejection fraction was in the range of 50% to 55%. Images were inadequate for LV wall motion assessment. In determinant diastolic function with atrial fibrillation    Consultations:  none  Discharge Exam: Filed Vitals:   08/07/13 0519  BP: 102/49  Pulse: 68  Temp: 97.9 F (36.6 C)  Resp: 17    General: A&O x3 Cardiovascular: RRR Respiratory: good air movement CTA B/L  Discharge Instructions      Discharge Orders   Future Orders Complete By Expires   Diet - low sodium heart healthy  As directed    Increase activity slowly  As directed        Medication List    STOP taking these medications       cefUROXime 500 MG tablet  Commonly known as:  CEFTIN     insulin glargine 100 UNIT/ML injection  Commonly known as:  LANTUS      TAKE these medications       aspirin EC 81 MG tablet  Take 81 mg by mouth daily.     azithromycin 250 MG tablet  Commonly known as:  ZITHROMAX  For 5 days     furosemide 80 MG tablet  Commonly known as:  LASIX  Take 1 tablet (80 mg total) by mouth 2 (two) times daily.     GAS-X 80 MG chewable tablet  Generic drug:  simethicone  Chew 80 mg by mouth every 6 (six) hours as needed for flatulence.     Insulin Detemir 100 UNIT/ML Sopn  Commonly known as:  LEVEMIR FLEXPEN  Inject 40 Units into the skin  daily.     lisinopril 20 MG tablet  Commonly known as:  PRINIVIL,ZESTRIL  Take 1 tablet by mouth daily with lunch.     LORazepam 0.5 MG tablet  Commonly known as:  ATIVAN  Take 0.5 mg by mouth 2 (two) times daily as needed for anxiety.     lovastatin 40 MG tablet  Commonly known as:  MEVACOR  Take 40 mg by mouth at bedtime.     lubiprostone 24 MCG capsule  Commonly known as:  AMITIZA  Take 1 capsule (24 mcg total) by mouth daily with breakfast.     magnesium oxide 400 MG tablet  Commonly known as:  MAG-OX  Take 400 mg by mouth daily.     metoCLOPramide 10 MG tablet  Commonly known as:  REGLAN  Take 1 tablet by mouth 3 (three) times daily as needed (digestion problems).     metoprolol 50 MG tablet  Commonly known as:  LOPRESSOR  Take 50 mg by mouth 2 (two) times daily.     pantoprazole 40 MG tablet  Commonly known as:  PROTONIX  Take 1 tablet (40 mg total) by mouth 2 (two) times daily.     prednisoLONE acetate 1 % ophthalmic suspension  Commonly known as:  PRED FORTE  Place 1 drop into both eyes 4 (four) times daily as needed (Dry Eyes).     Vitamin D-3 5000 UNITS Tabs  Take 5,000 Units by mouth daily.       Allergies  Allergen Reactions  . Ciprofloxacin Other (See Comments)    Sores all on legs.   . Finasteride     Affected  Patients testicles  . Xanax [Alprazolam] Other (See Comments)    Not in right state of mind.    Follow-up Information   Follow up with Donzetta Sprung, MD In 2 weeks. (hospital follow up)    Specialty:  Family Medicine   Contact information:   [address] [place] [address] [phone]        The results of significant diagnostics from this hospitalization (including imaging, microbiology, ancillary and laboratory) are listed below for reference.    Significant Diagnostic Studies: Ct Abdomen Pelvis Wo Contrast  08/06/2013   CLINICAL DATA:  Diffuse abdominal pain.  EXAM: CT ABDOMEN AND PELVIS WITHOUT CONTRAST  TECHNIQUE:  Multidetector CT imaging of the abdomen and pelvis was performed following the standard protocol without intravenous contrast.  COMPARISON:  07/23/2013  FINDINGS: Large right pleural effusion. Most of the right lower lobe is collapsed and there is partial right middle lobe atelectasis. Left lung is clear. No left pleural effusion. Cardiac silhouette is normal in size. There are  dense coronary artery calcifications.  Liver, spleen and pancreas are unremarkable. Gallbladder is surgically absent. No bile duct dilation. No adrenal masses.  Bilateral renal cortical thinning. No renal mass. No stone. No hydronephrosis. Ureters and bladder are unremarkable.  Prostate gland is enlarged but stable.  No pathologically enlarged lymph nodes. No abnormal fluid collections.  The wall of the distal antrum appears thickened, but unchanged from the prior study. The small bowel is unremarkable. Mild increased stool noted in the colon mostly the left colon. No colonic wall thickening or inflammatory changes. A normal appendix is visualized.  There is calcified plaque along a normal caliber abdominal aorta and its branch vessels.  Degenerative changes are noted of the visualized spine. There is a mild compression fracture of L1 with depression of the anterior and middle upper endplate. No osteoblastic or osteolytic lesions.  Compared to the prior exam, the L1 fracture stable. The right pleural effusion associated atelectasis is also stable as are the other described findings.  IMPRESSION: 1. No acute findings. No findings are seen to explain new abdominal pain. 2. Stable, large right pleural effusion with associated near complete right lower lobe atelectasis and partial right middle lobe atelectasis. No left pleural effusion. 3. The wall of the distal gastric antrum appears thickened, but this is stable. This could potentially reflect gastritis. 4. Mild increased stool mostly in the left colon. No bowel inflammatory change. 5. Diffuse  bilateral renal cortical thinning. 6. Other chronic findings as detailed stable from the prior study.   Electronically Signed   By: Amie Portland M.D.   On: 08/06/2013 14:37   Dg Chest 1 View  07/26/2013   CLINICAL DATA:  Post thoracentesis.  EXAM: CHEST - 1 VIEW  COMPARISON:  Chest radiograph 07/23/2013.  FINDINGS: Marked interval reduction in the right pleural effusion. Small right pleural effusion persists. Cardiomegaly. No pneumothorax is identified status post thoracentesis. Monitoring leads project over the chest. CABG. Aortic arch atherosclerosis.  IMPRESSION: Interval decrease in right pleural effusion, now small following uncomplicated right thoracentesis.   Electronically Signed   By: Andreas Newport M.D.   On: 07/26/2013 09:06   Dg Chest 1 View  07/23/2013   CLINICAL DATA:  Shortness of breath.  EXAM: CHEST - 1 VIEW  COMPARISON:  02/10/2013  FINDINGS: Opacification throughout the right mid and lower chest region. Findings are consistent with a large right pleural effusion. Prominent lung markings may represent mild edema. Heart size is upper limits of normal. Patient has median sternotomy wires.  IMPRESSION: Large right pleural effusion.  Mild edema or vascular congestion.   Electronically Signed   By: Richarda Overlie M.D.   On: 07/23/2013 13:38   Dg Chest 2 View  08/06/2013   CLINICAL DATA:  Right pleural effusion  EXAM: CHEST  2 VIEW  COMPARISON:  08/03/2013  FINDINGS: Persistent large right pleural effusion with associated right middle and lower lobe collapse/consolidation. Prior coronary bypass changes noted. Stable heart size and vascularity. Left lung remains clear. No pneumothorax.  IMPRESSION: Persistent large right pleural effusion.  No interval change.   Electronically Signed   By: Ruel Favors M.D.   On: 08/06/2013 07:46   Ct Abdomen Pelvis W Contrast  07/23/2013   CLINICAL DATA:  Upper abdominal pain and nausea  EXAM: CT ABDOMEN AND PELVIS WITH CONTRAST  TECHNIQUE: Multidetector CT  imaging of the abdomen and pelvis was performed using the standard protocol following bolus administration of intravenous contrast.  CONTRAST:  50mL OMNIPAQUE IOHEXOL 300 MG/ML  SOLN, 80mL OMNIPAQUE IOHEXOL 300 MG/ML SOLN  COMPARISON:  11/23/2012  FINDINGS: New right middle lobe and lower lobe consolidation with moderate right pleural effusion partly visualized. Small left pleural effusion with curvilinear left basilar atelectasis or scarring again noted.  Cholecystectomy clips are noted. Liver, adrenal glands, fatty infiltrated pancreas, spleen are unremarkable. There is renal cortical thinning with areas of focal renal cortical scarring without hydronephrosis or radiopaque renal or ureteral calculus. Mild to moderate atheromatous aortic calcification without aneurysm.  There is minimal stranding about the bladder with trace adjacent fluid. This finding is stable. Mild prostatomegaly again noted, 5.8 cm maximally. This produces impression upon the bladder base. No bowel wall thickening or focal segmental dilatation. The appendix is normal. Seminal vesicle calcification is identified. No lymphadenopathy. Presacral edema is present. No lytic or sclerotic osseous lesion or acute finding. Degenerative change noted in the spine.  IMPRESSION: New moderate right pleural effusion with collapse/ consolidation of the right middle and lower lobes, respectively. This may be due to adjacent mass effect although superimposed pneumonia could be obscured.  Stable trace left pleural effusion.  No specific abnormality is identified to explain the history of abdominal pain; there is stable minimal stranding about the bladder with adjacent trace fluid, of unknown etiology.   Electronically Signed   By: Christiana Pellant M.D.   On: 07/23/2013 13:51   Dg Chest Portable 1 View  08/03/2013   CLINICAL DATA:  72 year old male with chest pain and fluid collecting in the right lung. Initial encounter.  EXAM: PORTABLE CHEST - 1 VIEW   COMPARISON:  08/02/2013 and earlier.  FINDINGS: Portable AP upright view at 1336 hrs. Large right pleural effusion. Subtotal opacification of the right lung. No significant interval change allowing for differences in positioning.  Stable cardiac size and mediastinal contours. Sequelae of CABG. No pneumothorax or edema. The left lung remains grossly clear.  IMPRESSION: Large right pleural effusion, stable since yesterday.   Electronically Signed   By: Augusto Gamble M.D.   On: 08/03/2013 14:09   US Thoracentesis Asp Pleural Space W/img Guide  07/26/2013   CLINICAL DATA:  Large right pleural effusion  EXAM: ULTRASOUND GUIDED RIGHT THORACENTESIS  COMPARISON:  Chest radiograph 07/23/2013 and  FINDINGS: A total of approximately 1800 mL of clear yellow fluid was removed. A fluid sample of 180 mLsent for laboratory analysis.  IMPRESSION: Successful ultrasound guided right thoracentesis yielding 1800 mL of pleural fluid.  PROCEDURE: An ultrasound guided thoracentesis was thoroughly discussed with the patient and questions answered. The benefits, risks, alternatives and complications were also discussed. The patient understands and wishes to proceed with the procedure. Written consent was obtained. Time-out cortical followup.  Ultrasound was performed to localize and mark an adequate pocket of fluid in the right chest. The area was then prepped and draped in the normal sterile fashion. 1% Lidocaine was used for local anesthesia. Under ultrasound guidance a standard 8 French thoracentesis catheter was introduced. Thoracentesis was performed by syringe pump. The catheter was removed and a dressing applied.  Complications:  None   Electronically Signed   By: Ulyses Southward M.D.   On: 07/26/2013 10:47    Microbiology: No results found for this or any previous visit (from the past 240 hour(s)).   Labs: Basic Metabolic Panel:  Recent Labs Lab 08/03/13 1347 08/04/13 0515 08/05/13 0425 08/06/13 0538 08/07/13 0450  NA 136  139 140 140 140  K 4.3 3.6 4.1 3.6 3.6  CL 99 102 102 102 100  CO2 28 22 25 24 29   GLUCOSE 172* 123* 123* 103* 68*  BUN 31* 31* 39* 39* 39*  CREATININE 1.60* 1.63* 2.02* 2.25* 2.34*  CALCIUM 9.1 8.9 8.7 9.0 8.7   Liver Function Tests:  Recent Labs Lab 08/06/13 1250  AST 32  ALT 24  ALKPHOS 82  BILITOT 0.7  PROT 7.5  ALBUMIN 3.4*   No results found for this basename: LIPASE, AMYLASE,  in the last 168 hours No results found for this basename: AMMONIA,  in the last 168 hours CBC:  Recent Labs Lab 08/03/13 1347 08/04/13 0515 08/06/13 0538  WBC 7.0 6.1 7.0  NEUTROABS 4.8  --   --   HGB 12.2* 12.4* 13.0  HCT 35.8* 35.8* 38.1*  MCV 100.3* 98.4 100.5*  PLT 237 239 213   Cardiac Enzymes:  Recent Labs Lab 08/03/13 1347  TROPONINI <0.30   BNP: BNP (last 3 results)  Recent Labs  08/03/13 1347  PROBNP 1834.0*   CBG:  Recent Labs Lab 08/06/13 1921 08/06/13 1950 08/06/13 2023 08/06/13 2110 08/07/13 0606  GLUCAP 58* 65* 74 80 90       Signed:  FELIZ ORTIZ, ABRAHAM  [facility] 08/07/2013, 8:51 AM

## 2013-08-07 NOTE — Progress Notes (Signed)
Pt refused lasix 80 mg IV, he states that it will damage his kidney, explained the risk of not taking the medicine but still pt refused. Benedetto Coons notified.

## 2013-08-07 NOTE — Progress Notes (Signed)
   CARE MANAGEMENT NOTE 08/07/2013  Patient:  Chris [name], Chris [name]   Account Number:  [id number]  Date Initiated:  08/07/2013  Documentation initiated by:  [facility]  Subjective/Objective Assessment:   adm: Acute heart failure and Pleural effusion, right     Action/Plan:   discharge   Anticipated DC Date:  08/07/2013   Anticipated DC Plan:  HOME W HOME HEALTH SERVICES      DC Planning Services  CM consult      [facility] Choice  HOME HEALTH   Choice offered to / List presented to:  C-3 Spouse        HH arranged  HH-1 RN  HH-10 DISEASE MANAGEMENT      Status of service:  Completed, signed off Medicare Important Message given?   (If response is "NO", the following Medicare IM given date fields will be blank) Date Medicare IM given:   Date Additional Medicare IM given:    Discharge Disposition:  HOME W HOME HEALTH SERVICES  Per UR Regulation:    If discussed at Long Length of Stay Meetings, dates discussed:    Comments:  111/16/14 16:45 CM spoke with pt's spouse to offer choice. Pt's spouse chooses for [facility] for her husband.  Address and contact numbers were verified.  Referral faxed to [facility] for [facility].  No other CM needs were communicated.  Freddy Jaksch, BSN, CM [phone].

## 2013-08-17 ENCOUNTER — Institutional Professional Consult (permissible substitution) (INDEPENDENT_AMBULATORY_CARE_PROVIDER_SITE_OTHER): Payer: Medicare Other | Admitting: Thoracic Surgery (Cardiothoracic Vascular Surgery)

## 2013-08-17 ENCOUNTER — Encounter: Payer: Self-pay | Admitting: Thoracic Surgery (Cardiothoracic Vascular Surgery)

## 2013-08-17 NOTE — Progress Notes (Signed)
This encounter was created in error - please disregard.

## 2013-08-24 ENCOUNTER — Ambulatory Visit (HOSPITAL_COMMUNITY)
Admission: RE | Admit: 2013-08-24 | Discharge: 2013-08-24 | Disposition: A | Discharge status: Home / Self Care | Payer: Medicare Other | Source: Ambulatory Visit | Attending: Pulmonary Disease | Admitting: Pulmonary Disease

## 2013-08-24 ENCOUNTER — Other Ambulatory Visit (HOSPITAL_COMMUNITY): Payer: Self-pay | Admitting: Pulmonary Disease

## 2013-08-24 DIAGNOSIS — J90 Pleural effusion, not elsewhere classified: Secondary | ICD-10-CM

## 2013-08-24 DIAGNOSIS — Z951 Presence of aortocoronary bypass graft: Secondary | ICD-10-CM | POA: Insufficient documentation

## 2013-08-25 ENCOUNTER — Other Ambulatory Visit: Payer: Self-pay | Admitting: Thoracic Surgery (Cardiothoracic Vascular Surgery)

## 2013-08-25 ENCOUNTER — Institutional Professional Consult (permissible substitution) (INDEPENDENT_AMBULATORY_CARE_PROVIDER_SITE_OTHER): Payer: Medicare Other | Admitting: Thoracic Surgery (Cardiothoracic Vascular Surgery)

## 2013-08-25 ENCOUNTER — Encounter: Payer: Self-pay | Admitting: Thoracic Surgery (Cardiothoracic Vascular Surgery)

## 2013-08-25 ENCOUNTER — Ambulatory Visit
Admission: RE | Admit: 2013-08-25 | Discharge: 2013-08-25 | Disposition: A | Discharge status: Home / Self Care | Payer: Medicare Other | Source: Ambulatory Visit | Attending: Thoracic Surgery (Cardiothoracic Vascular Surgery) | Admitting: Thoracic Surgery (Cardiothoracic Vascular Surgery)

## 2013-08-25 ENCOUNTER — Other Ambulatory Visit: Payer: Self-pay | Admitting: *Deleted

## 2013-08-25 VITALS — BP 124/67 | HR 79 | Resp 20 | Ht 74.0 in | Wt 225.0 lb

## 2013-08-25 DIAGNOSIS — J90 Pleural effusion, not elsewhere classified: Secondary | ICD-10-CM

## 2013-08-25 MED ORDER — PREDNISONE (PAK) 10 MG PO TABS: ORAL_TABLET | ORAL | Status: DC

## 2013-08-25 NOTE — Progress Notes (Signed)
PCP is Donzetta Sprung, MD Referring Provider is Fredirick Maudlin, MD  Chief Complaint  Patient presents with  . Pleural Effusion    Surgical eval for possible VATS due to large right pleural effusion    HPI: 72 yo with multiple comorbidities who is sent for consultation regarding a recurrent right pleural effusion.  Chris [name] is a 72 year old gentleman with multiple medical problems including coronary artery disease. I did coronary bypass grafting on him back in 2002. 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 He has been having issues with abdominal discomfort including bloating and constipation for a long time. On November 1 he was admitted after presenting with complaints of abdominal discomfort. A CT of the abdomen showed a right pleural effusion dependent atelectasis and possibly an underlying community-acquired pneumonia. During the hospitalization he had a thoracentesis which showed serous fluid with a mildly elevated LDH at 89. Cytology showed mostly reactive mesothelial cells, but there were a few atypical cells which could not be further characterized. He was treated presumptively for pneumonia.  He was readmitted on 08/03/2013 with a chief complaint of shortness of breath. His pleural effusion had recurred. He was seen in consultation by pulmonary. Given the findings on his previous thoracentesis, which were consistent with a transudative effusion, they advised diuresis.  He continues to have a chief complaint of shortness of breath. He does still have abdominal bloating. He says that in his stomach becomes bloated it affects his heart rhythm. He says that he can lie on his right side sleep but cannot lie on the left side because of shortness of breath. His peripheral edema has improved. He says he has lost about 25 pounds by being on Lasix over the past 2-3 months. A repeat chest x-ray  yesterday showed that the effusion had completely reaccumulated.  Past Medical History  Diagnosis Date  . 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 Diabetes mellitus   . Macrocytosis     normal B12 & folate levels  . Ischemic cardiomyopathy     EF of 45-50% - posterior & inferior walls are severly hypokinetic  . Atrial fibrillation     not candidate for chronic anticoagulation with Coumadin given chrronic epistaxis as well as hemorrhagic conversion of his acute cva   . Hyperlipidemia   . Hypertension   . Complication of anesthesia   . PONV (postoperative nausea and vomiting)   . Acute renal insufficiency     resolved  . Myocardial infarct   . CHF (congestive heart failure)     Past Surgical History  Procedure Laterality Date  . Coronary artery bypass graft    . Tonsillectomy    . Esophagogastroduodenoscopy (egd) with esophageal dilation N/A 10/27/2012    Dr. Jena Gauss: distal esophageal diverticulum, non-complete Schatzki's ring s/p dilation, distal esophageal nodule with benign path, negative Barrett's  . Cholecystectomy  2008  . Colonoscopy N/A 12/23/2012    Procedure: COLONOSCOPY;  Surgeon: Corbin Ade, MD;  Location: [facility];  Service: Endoscopy;  Laterality: N/A;  9:45  . Cardiac surgery      Family History  Problem Relation Age of Onset  . Colon cancer Neg Hx   . Colon polyps Sister   . Colon polyps Sister     Social History History  Substance Use Topics  . Smoking status: Never Smoker   . Smokeless  tobacco: Never Used  . Alcohol Use: No    Current Outpatient Prescriptions  Medication Sig Dispense Refill  . aspirin EC 81 MG tablet Take 81 mg by mouth daily.      . Cholecalciferol (VITAMIN D-3) 5000 UNITS TABS Take 5,000 Units by mouth daily.       . furosemide (LASIX) 80 MG tablet Take 1 tablet (80 mg total) by mouth 2 (two) times daily.  60 tablet  0  . Insulin Detemir (LEVEMIR  FLEXPEN) 100 UNIT/ML SOPN Inject 40 Units into the skin daily.  2 pen  2  . lisinopril (PRINIVIL,ZESTRIL) 20 MG tablet Take 1 tablet by mouth daily with lunch.      Marland Kitchen LORazepam (ATIVAN) 0.5 MG tablet Take 0.5 mg by mouth 2 (two) times daily as needed for anxiety.      . lovastatin (MEVACOR) 40 MG tablet Take 40 mg by mouth at bedtime.        Marland Kitchen lubiprostone (AMITIZA) 24 MCG capsule Take 24 mcg by mouth daily with breakfast. 1/2 tablet per day      . metoCLOPramide (REGLAN) 10 MG tablet Take 1 tablet by mouth 3 (three) times daily as needed (digestion problems).       . metoprolol (LOPRESSOR) 50 MG tablet Take 50 mg by mouth 2 (two) times daily.        . prednisoLONE acetate (PRED FORTE) 1 % ophthalmic suspension Place 1 drop into both eyes 4 (four) times daily as needed (Dry Eyes).      . simethicone (GAS-X) 80 MG chewable tablet Chew 80 mg by mouth every 6 (six) hours as needed for flatulence.      . predniSONE (STERAPRED UNI-PAK) 10 MG tablet Take 5 tablets by mouth day 1, 4 tablets on day 2, 3 tablets on day 3, 2 tablets on day 4, 1 tablet on day 5, then discontinue  15 tablet  0   No current facility-administered medications for this visit.    Allergies  Allergen Reactions  . Ciprofloxacin Other (See Comments)    Sores all on legs.   . Finasteride     Affected  Patients testicles  . Xanax [Alprazolam] Other (See Comments)    Not in right state of mind.     Review of Systems  Constitutional: Positive for appetite change, fatigue and unexpected weight change (25 pound weight loss over 3 months with lasix).  HENT: Positive for hearing loss.   Respiratory: Positive for shortness of breath.   Cardiovascular: Positive for chest pain and leg swelling.       Poor circulation in feet  Gastrointestinal: Positive for constipation and abdominal distention.  Genitourinary:       "kidney disease"  Musculoskeletal: Positive for gait problem.  Neurological: Positive for numbness.   Hematological: Bruises/bleeds easily.  All other systems reviewed and are negative.    BP 124/67  Pulse 79  Resp 20  Ht 6\' 2"  (1.88 m)  Wt 225 lb (102.059 kg)  BMI 28.88 kg/m2  SpO2 95% Physical Exam  Vitals reviewed. Constitutional: He is oriented to person, place, and time. He appears well-developed and well-nourished. No distress.  HENT:  Head: Normocephalic and atraumatic.  Eyes: Pupils are equal, round, and reactive to light.  Neck: Neck supple. No JVD present. No thyromegaly present.  Cardiovascular:  No murmur heard. Irregularly irregular  Pulmonary/Chest: He has no wheezes. He has no rales.  Absent BS right lower 1/2 of chest  Abdominal: Soft. He exhibits distension. There  is no tenderness.  Musculoskeletal: He exhibits edema (trace bilaterally).  Lymphadenopathy:    He has no cervical adenopathy.  Neurological: He is alert and oriented to person, place, and time. Coordination abnormal.  Skin:  Venous stasis changes bilaterally     Diagnostic Tests: CT ABDOMEN AND PELVIS WITHOUT CONTRAST  TECHNIQUE:  Multidetector CT imaging of the abdomen and pelvis was performed  following the standard protocol without intravenous contrast.  COMPARISON: 07/23/2013  FINDINGS:  Large right pleural effusion. Most of the right lower lobe is  collapsed and there is partial right middle lobe atelectasis. Left  lung is clear. No left pleural effusion. Cardiac silhouette is  normal in size. There are dense coronary artery calcifications.  Liver, spleen and pancreas are unremarkable. Gallbladder is  surgically absent. No bile duct dilation. No adrenal masses.  Bilateral renal cortical thinning. No renal mass. No stone. No  hydronephrosis. Ureters and bladder are unremarkable.  Prostate gland is enlarged but stable.  No pathologically enlarged lymph nodes. No abnormal fluid  collections.  The wall of the distal antrum appears thickened, but unchanged from  the prior study. The  small bowel is unremarkable. Mild increased  stool noted in the colon mostly the left colon. No colonic wall  thickening or inflammatory changes. A normal appendix is visualized.  There is calcified plaque along a normal caliber abdominal aorta and  its branch vessels.  Degenerative changes are noted of the visualized spine. There is a  mild compression fracture of L1 with depression of the anterior and  middle upper endplate. No osteoblastic or osteolytic lesions.  Compared to the prior exam, the L1 fracture stable. The right  pleural effusion associated atelectasis is also stable as are the  other described findings.  IMPRESSION:  1. No acute findings. No findings are seen to explain new abdominal  pain.  2. Stable, large right pleural effusion with associated near  complete right lower lobe atelectasis and partial right middle lobe  atelectasis. No left pleural effusion.  3. The wall of the distal gastric antrum appears thickened, but this  is stable. This could potentially reflect gastritis.  4. Mild increased stool mostly in the left colon. No bowel  inflammatory change.  5. Diffuse bilateral renal cortical thinning.  6. Other chronic findings as detailed stable from the prior study.  Electronically Signed  By: Amie Portland M.D.  On: 08/06/2013 14:37         Chest x-ray 08/24/2013  CHEST 2 VIEW  COMPARISON: 08/15/2013  FINDINGS:  Large right pleural effusion with passive atelectasis, encompassing  2/3 of the right hemithorax. Prior CABG. Left lung appears clear.  Atherosclerotic aortic arch.  IMPRESSION:  1. Large right pleural effusion, approximately 2/3 of the right  hemithorax.  2. Prior CABG.  Electronically Signed  By: Herbie Baltimore M.D.  On: 08/24/2013 09:55   Impression:  72 year old gentleman with multiple medical problems who has a recurrent right pleural effusion. He had a thoracentesis of this about a month ago. They provided some symptomatic relief  but the fluid rapidly recurred. This was in the setting of diuresis with improvement of his peripheral edema. His fluid was basically transudate, although LDH was very mildly elevated. I suspect that this likely is still congestive heart failure related. I do not think the fluid is directly related to his gastrointestinal issues. He does have a history of rather extensive asbestos exposure when he was young. There were some atypical cells on cytology. Otherwise the appearance is not  consistent with mesothelioma.  I discussed our options with Mr. and Chris [name]. He did have some difficulty with following the conversation. I recommended that we do a right VATS to drain the effusion, do pleural biopsies, and place a Pleurx catheter. I discussed with them the advantages and disadvantages of that approach. I did discuss the alternative which was to do a repeat thoracentesis.I do feel that since his effusion pretty rapidly recurred, it is likely to recur again over a relatively short time frame.  Chris [name] is scheduled to have cataract surgery on Monday the eighth on one eye and then Tuesday the 16th on her other eye. Chris [name] does not wish to have any type of surgical procedure done until after she has recovered from her eye procedures. He wishes to have a thoracentesis done today, and then "wait and see if it comes back".   Plan: Right thoracentesis for symptomatic relief   I discussed the indications, risks, and benefits with them. They understand the risks of bleeding and pneumothorax. They also understand that we will not attempt to drain all the fluid today but will take off about 1.5 L for symptomatic relief.  Procedure note  Using sterile technique and 1% lidocaine local anesthetic right thoracentesis performed. 1.5 L of serous fluid removed. Patient tolerated well. The fluid will be sent for cytology, chemistries, and cell count.  He will be sent for a PA lateral chest x-ray today.  We will  give him an empiric course of prednisone 50 mg tapering to 10 mg over 5 days.  I will see him back in 3 weeks with a repeat chest x-ray.

## 2013-08-26 LAB — GLUCOSE, SEROUS FLUID: Glucose, Fluid: 165 mg/dL

## 2013-08-26 LAB — BODY FLUID CELL COUNT WITH DIFFERENTIAL
Lymphs, Fluid: 80 %
Monocyte-Macrophage-Serous Fluid: 16 %
Neutrophil Count, Fluid: 4 % (ref 0–25)

## 2013-08-26 LAB — PROTEIN, CSF

## 2013-08-29 LAB — CYTOLOGY, FLUID (SOLSTAS)

## 2013-09-13 ENCOUNTER — Other Ambulatory Visit: Payer: Self-pay | Admitting: *Deleted

## 2013-09-13 DIAGNOSIS — J90 Pleural effusion, not elsewhere classified: Secondary | ICD-10-CM

## 2013-09-20 ENCOUNTER — Ambulatory Visit: Payer: Medicare Other | Admitting: Thoracic Surgery (Cardiothoracic Vascular Surgery)

## 2013-10-04 ENCOUNTER — Emergency Department (HOSPITAL_COMMUNITY): Payer: Medicare HMO

## 2013-10-04 ENCOUNTER — Inpatient Hospital Stay (HOSPITAL_COMMUNITY)
Admission: EM | Admit: 2013-10-04 | Discharge: 2013-10-07 | DRG: 187 | Disposition: A | Discharge status: Home / Self Care | Payer: Medicare HMO | Attending: Internal Medicine | Admitting: Internal Medicine

## 2013-10-04 ENCOUNTER — Encounter (HOSPITAL_COMMUNITY): Payer: Self-pay | Admitting: Emergency Medicine

## 2013-10-04 DIAGNOSIS — I252 Old myocardial infarction: Secondary | ICD-10-CM

## 2013-10-04 DIAGNOSIS — I959 Hypotension, unspecified: Secondary | ICD-10-CM

## 2013-10-04 DIAGNOSIS — I129 Hypertensive chronic kidney disease with stage 1 through stage 4 chronic kidney disease, or unspecified chronic kidney disease: Secondary | ICD-10-CM | POA: Diagnosis present

## 2013-10-04 DIAGNOSIS — E1149 Type 2 diabetes mellitus with other diabetic neurological complication: Secondary | ICD-10-CM | POA: Diagnosis present

## 2013-10-04 DIAGNOSIS — E1129 Type 2 diabetes mellitus with other diabetic kidney complication: Secondary | ICD-10-CM | POA: Diagnosis present

## 2013-10-04 DIAGNOSIS — N184 Chronic kidney disease, stage 4 (severe): Secondary | ICD-10-CM | POA: Diagnosis present

## 2013-10-04 DIAGNOSIS — Z951 Presence of aortocoronary bypass graft: Secondary | ICD-10-CM

## 2013-10-04 DIAGNOSIS — Z79899 Other long term (current) drug therapy: Secondary | ICD-10-CM

## 2013-10-04 DIAGNOSIS — K5900 Constipation, unspecified: Secondary | ICD-10-CM

## 2013-10-04 DIAGNOSIS — E1142 Type 2 diabetes mellitus with diabetic polyneuropathy: Secondary | ICD-10-CM | POA: Diagnosis present

## 2013-10-04 DIAGNOSIS — Z8371 Family history of colonic polyps: Secondary | ICD-10-CM | POA: Diagnosis not present

## 2013-10-04 DIAGNOSIS — Z794 Long term (current) use of insulin: Secondary | ICD-10-CM

## 2013-10-04 DIAGNOSIS — Z7982 Long term (current) use of aspirin: Secondary | ICD-10-CM | POA: Diagnosis not present

## 2013-10-04 DIAGNOSIS — I2589 Other forms of chronic ischemic heart disease: Secondary | ICD-10-CM | POA: Diagnosis present

## 2013-10-04 DIAGNOSIS — E1165 Type 2 diabetes mellitus with hyperglycemia: Secondary | ICD-10-CM | POA: Diagnosis present

## 2013-10-04 DIAGNOSIS — E1121 Type 2 diabetes mellitus with diabetic nephropathy: Secondary | ICD-10-CM

## 2013-10-04 DIAGNOSIS — K219 Gastro-esophageal reflux disease without esophagitis: Secondary | ICD-10-CM

## 2013-10-04 DIAGNOSIS — Z83719 Family history of colon polyps, unspecified: Secondary | ICD-10-CM

## 2013-10-04 DIAGNOSIS — G8929 Other chronic pain: Secondary | ICD-10-CM | POA: Diagnosis present

## 2013-10-04 DIAGNOSIS — I2510 Atherosclerotic heart disease of native coronary artery without angina pectoris: Secondary | ICD-10-CM | POA: Diagnosis present

## 2013-10-04 DIAGNOSIS — Z888 Allergy status to other drugs, medicaments and biological substances status: Secondary | ICD-10-CM | POA: Diagnosis not present

## 2013-10-04 DIAGNOSIS — I9589 Other hypotension: Secondary | ICD-10-CM | POA: Diagnosis not present

## 2013-10-04 DIAGNOSIS — Z8673 Personal history of transient ischemic attack (TIA), and cerebral infarction without residual deficits: Secondary | ICD-10-CM | POA: Diagnosis not present

## 2013-10-04 DIAGNOSIS — R1013 Epigastric pain: Secondary | ICD-10-CM | POA: Diagnosis present

## 2013-10-04 DIAGNOSIS — N058 Unspecified nephritic syndrome with other morphologic changes: Secondary | ICD-10-CM | POA: Diagnosis present

## 2013-10-04 DIAGNOSIS — R109 Unspecified abdominal pain: Secondary | ICD-10-CM

## 2013-10-04 DIAGNOSIS — E785 Hyperlipidemia, unspecified: Secondary | ICD-10-CM | POA: Diagnosis present

## 2013-10-04 DIAGNOSIS — I4891 Unspecified atrial fibrillation: Secondary | ICD-10-CM | POA: Diagnosis present

## 2013-10-04 DIAGNOSIS — E119 Type 2 diabetes mellitus without complications: Secondary | ICD-10-CM

## 2013-10-04 DIAGNOSIS — R0602 Shortness of breath: Secondary | ICD-10-CM | POA: Diagnosis present

## 2013-10-04 DIAGNOSIS — Z881 Allergy status to other antibiotic agents status: Secondary | ICD-10-CM | POA: Diagnosis not present

## 2013-10-04 DIAGNOSIS — J90 Pleural effusion, not elsewhere classified: Principal | ICD-10-CM | POA: Diagnosis present

## 2013-10-04 DIAGNOSIS — I10 Essential (primary) hypertension: Secondary | ICD-10-CM | POA: Diagnosis present

## 2013-10-04 HISTORY — DX: Pleural effusion, not elsewhere classified: J90

## 2013-10-04 LAB — COMPREHENSIVE METABOLIC PANEL
ALT: 20 U/L (ref 0–53)
AST: 28 U/L (ref 0–37)
Albumin: 3.9 g/dL (ref 3.5–5.2)
Alkaline Phosphatase: 81 U/L (ref 39–117)
BILIRUBIN TOTAL: 1 mg/dL (ref 0.3–1.2)
BUN: 52 mg/dL — ABNORMAL HIGH (ref 6–23)
CALCIUM: 9.5 mg/dL (ref 8.4–10.5)
CHLORIDE: 96 meq/L (ref 96–112)
CO2: 28 meq/L (ref 19–32)
Creatinine, Ser: 2.05 mg/dL — ABNORMAL HIGH (ref 0.50–1.35)
GFR calc Af Amer: 35 mL/min — ABNORMAL LOW (ref >90)
GFR calc non Af Amer: 30 mL/min — ABNORMAL LOW (ref >90)
Glucose, Bld: 231 mg/dL — ABNORMAL HIGH (ref 70–99)
Potassium: 4.9 mEq/L (ref 3.7–5.3)
SODIUM: 137 meq/L (ref 137–147)
Total Protein: 8.5 g/dL — ABNORMAL HIGH (ref 6.0–8.3)

## 2013-10-04 LAB — URINALYSIS, ROUTINE W REFLEX MICROSCOPIC
Bilirubin Urine: NEGATIVE
Glucose, UA: NEGATIVE mg/dL
Ketones, ur: NEGATIVE mg/dL
LEUKOCYTES UA: NEGATIVE
Nitrite: NEGATIVE
PH: 5.5 (ref 5.0–8.0)
Specific Gravity, Urine: 1.02 (ref 1.005–1.030)
Urobilinogen, UA: 0.2 mg/dL (ref 0.0–1.0)

## 2013-10-04 LAB — GLUCOSE, CAPILLARY
GLUCOSE-CAPILLARY: 170 mg/dL — AB (ref 70–99)
GLUCOSE-CAPILLARY: 38 mg/dL — AB (ref 70–99)
GLUCOSE-CAPILLARY: 40 mg/dL — AB (ref 70–99)
GLUCOSE-CAPILLARY: 130 mg/dL — AB (ref 70–99)
Glucose-Capillary: 140 mg/dL — ABNORMAL HIGH (ref 70–99)
Glucose-Capillary: 156 mg/dL — ABNORMAL HIGH (ref 70–99)
Glucose-Capillary: 94 mg/dL (ref 70–99)

## 2013-10-04 LAB — CBC WITH DIFFERENTIAL/PLATELET
BASOS ABS: 0 10*3/uL (ref 0.0–0.1)
BASOS PCT: 0 % (ref 0–1)
Eosinophils Absolute: 0.1 10*3/uL (ref 0.0–0.7)
Eosinophils Relative: 1 % (ref 0–5)
HEMATOCRIT: 34.4 % — AB (ref 39.0–52.0)
Hemoglobin: 12.3 g/dL — ABNORMAL LOW (ref 13.0–17.0)
LYMPHS PCT: 21 % (ref 12–46)
Lymphs Abs: 1.7 10*3/uL (ref 0.7–4.0)
MCH: 35.1 pg — ABNORMAL HIGH (ref 26.0–34.0)
MCHC: 35.8 g/dL (ref 30.0–36.0)
MCV: 98.3 fL (ref 78.0–100.0)
Monocytes Absolute: 0.8 10*3/uL (ref 0.1–1.0)
Monocytes Relative: 10 % (ref 3–12)
NEUTROS ABS: 5.3 10*3/uL (ref 1.7–7.7)
Neutrophils Relative %: 68 % (ref 43–77)
PLATELETS: 222 10*3/uL (ref 150–400)
RBC: 3.5 MIL/uL — ABNORMAL LOW (ref 4.22–5.81)
RDW: 12.3 % (ref 11.5–15.5)
WBC: 7.8 10*3/uL (ref 4.0–10.5)

## 2013-10-04 LAB — LIPASE, BLOOD: Lipase: 30 U/L (ref 11–59)

## 2013-10-04 LAB — URINE MICROSCOPIC-ADD ON

## 2013-10-04 MED ORDER — SODIUM CHLORIDE 0.9 % IV SOLN: 250.0000 mL | INTRAVENOUS | Status: DC | PRN

## 2013-10-04 MED ORDER — INSULIN DETEMIR 100 UNIT/ML ~~LOC~~ SOLN
40.0000 [IU] | Freq: Every day | SUBCUTANEOUS | Status: DC
  Administered 2013-10-04 – 2013-10-07 (×2): 40 [IU] via SUBCUTANEOUS
  Filled 2013-10-04 (×5): qty 0.4

## 2013-10-04 MED ORDER — DEXTROSE 50 % IV SOLN
INTRAVENOUS | Status: AC
  Administered 2013-10-04: 17:00:00
  Filled 2013-10-04: qty 50

## 2013-10-04 MED ORDER — MORPHINE SULFATE 2 MG/ML IJ SOLN: 2.0000 mg | INTRAMUSCULAR | Status: DC | PRN

## 2013-10-04 MED ORDER — SODIUM CHLORIDE 0.9 % IJ SOLN: 3.0000 mL | INTRAMUSCULAR | Status: DC | PRN

## 2013-10-04 MED ORDER — ONDANSETRON HCL 4 MG/2ML IJ SOLN: 4.0000 mg | Freq: Three times a day (TID) | INTRAMUSCULAR | Status: DC | PRN

## 2013-10-04 MED ORDER — PREDNISOLONE ACETATE 1 % OP SUSP
1.0000 [drp] | Freq: Four times a day (QID) | OPHTHALMIC | Status: DC | PRN
  Filled 2013-10-04: qty 1

## 2013-10-04 MED ORDER — METOPROLOL TARTRATE 50 MG PO TABS
50.0000 mg | ORAL_TABLET | Freq: Two times a day (BID) | ORAL | Status: DC
  Administered 2013-10-04 – 2013-10-05 (×4): 50 mg via ORAL
  Filled 2013-10-04 (×9): qty 1

## 2013-10-04 MED ORDER — SIMVASTATIN 20 MG PO TABS
20.0000 mg | ORAL_TABLET | Freq: Every day | ORAL | Status: DC
  Administered 2013-10-04 – 2013-10-06 (×3): 20 mg via ORAL
  Filled 2013-10-04 (×6): qty 1

## 2013-10-04 MED ORDER — LISINOPRIL 20 MG PO TABS
20.0000 mg | ORAL_TABLET | Freq: Every day | ORAL | Status: DC
  Administered 2013-10-04 – 2013-10-05 (×2): 20 mg via ORAL
  Filled 2013-10-04: qty 2
  Filled 2013-10-04 (×3): qty 1

## 2013-10-04 MED ORDER — METOCLOPRAMIDE HCL 10 MG PO TABS
10.0000 mg | ORAL_TABLET | Freq: Three times a day (TID) | ORAL | Status: DC | PRN
  Filled 2013-10-04: qty 1

## 2013-10-04 MED ORDER — ONDANSETRON HCL 4 MG/2ML IJ SOLN
4.0000 mg | Freq: Four times a day (QID) | INTRAMUSCULAR | Status: DC | PRN
  Administered 2013-10-06: 4 mg via INTRAVENOUS
  Filled 2013-10-04: qty 2

## 2013-10-04 MED ORDER — ONDANSETRON HCL 4 MG PO TABS: 4.0000 mg | ORAL_TABLET | Freq: Four times a day (QID) | ORAL | Status: DC | PRN

## 2013-10-04 MED ORDER — SODIUM CHLORIDE 0.9 % IJ SOLN
3.0000 mL | Freq: Two times a day (BID) | INTRAMUSCULAR | Status: DC
  Administered 2013-10-04 – 2013-10-06 (×5): 3 mL via INTRAVENOUS

## 2013-10-04 NOTE — H&P (Signed)
PCP:   Gar Ponto, MD   Chief Complaint:  Shortness of breath  HPI:  73 year old male who  has a past medical history of Acute cerebrovascular accident; Diabetes mellitus; Macrocytosis; Ischemic cardiomyopathy; Atrial fibrillation; Hyperlipidemia; Hypertension; Complication of anesthesia; PONV (postoperative nausea and vomiting); Acute renal insufficiency; Myocardial infarct; and CHF (congestive heart failure). Today presented to the hospital with worsening shortness of breath, patient has history of recurrent right-sided thoracic pleural effusion and has required multiple thoracentesis for both diagnostic and therapeutic purposes. Patient was followed by Dr. Roxan Hockey of CT surgery who performed thoracentesis and the results of the fluid analysis showed transudate. No malignant cells seen in the effusion. There was a discussion to perform VATS procedure, with a drain but patient did not followup with CT surgeon. He came today with worsening shortness of breath, and also bloating in the abdomen with excessive belching. He denies chest pain, no nausea vomiting or diarrhea. Patient does not appear to be in any respiratory distress  at this time  Allergies:   Allergies  Allergen Reactions  . Ciprofloxacin Other (See Comments)    Sores all on legs.   . Finasteride     Affected  Patients testicles  . Xanax [Alprazolam] Other (See Comments)    Not in right state of mind.       Past Medical History  Diagnosis Date  . 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 Diabetes mellitus   . Macrocytosis     normal B12 & folate levels  . Ischemic cardiomyopathy     EF of 45-50% - posterior & inferior walls are severly hypokinetic  . Atrial fibrillation     not candidate for chronic anticoagulation with Coumadin given chrronic epistaxis as well as hemorrhagic conversion of his acute cva   . Hyperlipidemia   . Hypertension    . Complication of anesthesia   . PONV (postoperative nausea and vomiting)   . Acute renal insufficiency     resolved  . Myocardial infarct   . CHF (congestive heart failure)     Past Surgical History  Procedure Laterality Date  . Coronary artery bypass graft    . Tonsillectomy    . Esophagogastroduodenoscopy (egd) with esophageal dilation N/A 10/27/2012    Dr. Gala Romney: distal esophageal diverticulum, non-complete Schatzki's ring s/p dilation, distal esophageal nodule with benign path, negative Barrett's  . Cholecystectomy  2008  . Colonoscopy N/A 12/23/2012    Procedure: COLONOSCOPY;  Surgeon: Daneil Dolin, MD;  Location: [facility];  Service: Endoscopy;  Laterality: N/A;  9:45  . Cardiac surgery      Prior to Admission medications   Medication Sig Start Date End Date Taking? Authorizing Provider  aspirin EC 81 MG tablet Take 81 mg by mouth daily.    Historical Provider, MD  Cholecalciferol (VITAMIN D-3) 5000 UNITS TABS Take 5,000 Units by mouth daily.     Historical Provider, MD  furosemide (LASIX) 80 MG tablet Take 1 tablet (80 mg total) by mouth 2 (two) times daily. 08/07/13   Charlynne Cousins, MD  Insulin Detemir (LEVEMIR FLEXPEN) 100 UNIT/ML SOPN Inject 40 Units into the skin daily. 08/07/13   Charlynne Cousins, MD  lisinopril (PRINIVIL,ZESTRIL) 20 MG tablet Take 1 tablet by mouth daily with lunch. 06/28/13   Historical Provider, MD  LORazepam (ATIVAN) 0.5 MG tablet Take 0.5 mg by mouth 2 (two) times daily as needed for anxiety.  Historical Provider, MD  lovastatin (MEVACOR) 40 MG tablet Take 40 mg by mouth at bedtime.      Historical Provider, MD  lubiprostone (AMITIZA) 24 MCG capsule Take 24 mcg by mouth daily with breakfast. 1/2 tablet per day 07/27/13   Domenic Polite, MD  metoCLOPramide (REGLAN) 10 MG tablet Take 1 tablet by mouth 3 (three) times daily as needed (digestion problems).  07/19/13   Historical Provider, MD  metoprolol (LOPRESSOR) 50 MG tablet Take 50 mg by  mouth 2 (two) times daily.      Historical Provider, MD  prednisoLONE acetate (PRED FORTE) 1 % ophthalmic suspension Place 1 drop into both eyes 4 (four) times daily as needed (Dry Eyes).    Historical Provider, MD  predniSONE (STERAPRED UNI-PAK) 10 MG tablet Take 5 tablets by mouth day 1, 4 tablets on day 2, 3 tablets on day 3, 2 tablets on day 4, 1 tablet on day 5, then discontinue 08/25/13   Melrose Nakayama, MD  simethicone (GAS-X) 80 MG chewable tablet Chew 80 mg by mouth every 6 (six) hours as needed for flatulence.    Historical Provider, MD    Social History:  reports that he has never smoked. He has never used smokeless tobacco. He reports that he does not drink alcohol or use illicit drugs.  Family History  Problem Relation Age of Onset  . Colon cancer Neg Hx   . Colon polyps Sister   . Colon polyps Sister      All the positives are listed in BOLD  Review of Systems:  HEENT: Headache, blurred vision, runny nose, sore throat Neck: Hypothyroidism, hyperthyroidism,,lymphadenopathy Chest : Shortness of breath, history of COPD, Asthma Heart : Chest pain, history of coronary arterey disease GI:  Nausea, vomiting, diarrhea, constipation, GERD GU: Dysuria, urgency, frequency of urination, hematuria Neuro: Stroke, seizures, syncope Psych: Depression, anxiety, hallucinations   Physical Exam: Blood pressure 155/78, pulse 88, temperature 98.2 F (36.8 C), temperature source Oral, weight 99.791 kg (220 lb), SpO2 98.00%. Constitutional:   Patient is a well-developed and well-nourished male* in no acute distress and cooperative with exam. Head: Normocephalic and atraumatic Mouth: Mucus membranes moist Eyes: PERRL, EOMI, conjunctivae normal Neck: Supple, No Thyromegaly Cardiovascular: RRR, S1 normal, S2 normal Pulmonary/Chest: Absent breath sounds in the right hemithorax Abdominal: Soft. Non-tender, non-distended, bowel sounds are normal, no masses, organomegaly, or guarding  present.  Neurological: A&O x3, Strenght is normal and symmetric bilaterally, cranial nerve II-XII are grossly intact, no focal motor deficit, sensory intact to light touch bilaterally.  Extremities : No Cyanosis, Clubbing or Edema   Labs on Admission:  Results for orders placed during the hospital encounter of 10/04/13 (from the past 48 hour(s))  CBC WITH DIFFERENTIAL     Status: Abnormal   Collection Time    10/04/13 12:22 AM      Result Value Range   WBC 7.8  4.0 - 10.5 K/uL   RBC 3.50 (*) 4.22 - 5.81 MIL/uL   Hemoglobin 12.3 (*) 13.0 - 17.0 g/dL   HCT 34.4 (*) 39.0 - 52.0 %   MCV 98.3  78.0 - 100.0 fL   MCH 35.1 (*) 26.0 - 34.0 pg   MCHC 35.8  30.0 - 36.0 g/dL   RDW 12.3  11.5 - 15.5 %   Platelets 222  150 - 400 K/uL   Neutrophils Relative % 68  43 - 77 %   Neutro Abs 5.3  1.7 - 7.7 K/uL   Lymphocytes Relative 21  12 - 46 %   Lymphs Abs 1.7  0.7 - 4.0 K/uL   Monocytes Relative 10  3 - 12 %   Monocytes Absolute 0.8  0.1 - 1.0 K/uL   Eosinophils Relative 1  0 - 5 %   Eosinophils Absolute 0.1  0.0 - 0.7 K/uL   Basophils Relative 0  0 - 1 %   Basophils Absolute 0.0  0.0 - 0.1 K/uL  COMPREHENSIVE METABOLIC PANEL     Status: Abnormal   Collection Time    10/04/13 12:22 AM      Result Value Range   Sodium 137  137 - 147 mEq/L   Potassium 4.9  3.7 - 5.3 mEq/L   Chloride 96  96 - 112 mEq/L   CO2 28  19 - 32 mEq/L   Glucose, Bld 231 (*) 70 - 99 mg/dL   BUN 52 (*) 6 - 23 mg/dL   Creatinine, Ser 2.05 (*) 0.50 - 1.35 mg/dL   Calcium 9.5  8.4 - 10.5 mg/dL   Total Protein 8.5 (*) 6.0 - 8.3 g/dL   Albumin 3.9  3.5 - 5.2 g/dL   AST 28  0 - 37 U/L   ALT 20  0 - 53 U/L   Alkaline Phosphatase 81  39 - 117 U/L   Total Bilirubin 1.0  0.3 - 1.2 mg/dL   GFR calc non Af Amer 30 (*) >90 mL/min   GFR calc Af Amer 35 (*) >90 mL/min   Comment: (NOTE)     The eGFR has been calculated using the CKD EPI equation.     This calculation has not been validated in all clinical situations.      eGFR's persistently <90 mL/min signify possible Chronic Kidney     Disease.  LIPASE, BLOOD     Status: None   Collection Time    10/04/13 12:22 AM      Result Value Range   Lipase 30  11 - 59 U/L  URINALYSIS, ROUTINE W REFLEX MICROSCOPIC     Status: Abnormal   Collection Time    10/04/13  1:03 AM      Result Value Range   Color, Urine YELLOW  YELLOW   APPearance CLEAR  CLEAR   Specific Gravity, Urine 1.020  1.005 - 1.030   pH 5.5  5.0 - 8.0   Glucose, UA NEGATIVE  NEGATIVE mg/dL   Hgb urine dipstick TRACE (*) NEGATIVE   Bilirubin Urine NEGATIVE  NEGATIVE   Ketones, ur NEGATIVE  NEGATIVE mg/dL   Protein, ur TRACE (*) NEGATIVE mg/dL   Urobilinogen, UA 0.2  0.0 - 1.0 mg/dL   Nitrite NEGATIVE  NEGATIVE   Leukocytes, UA NEGATIVE  NEGATIVE  URINE MICROSCOPIC-ADD ON     Status: Abnormal   Collection Time    10/04/13  1:03 AM      Result Value Range   Squamous Epithelial / LPF FEW (*) RARE   WBC, UA 0-2  <3 WBC/hpf   RBC / HPF 0-2  <3 RBC/hpf   Bacteria, UA FEW (*) RARE   Casts HYALINE CASTS (*) NEGATIVE    Radiological Exams on Admission: Dg Chest 2 View  10/04/2013   CLINICAL DATA:  Shortness of breath. Prior CABG. Current history of ischemic cardiomyopathy, atrial fibrillation, diabetes, and hypertension.  EXAM: CHEST  2 VIEW  COMPARISON:  08/25/2013, 08/15/2013.  CT chest 08/06/2013.  FINDINGS: Recurrent large right pleural effusion and associated dense passive atelectasis in the right middle lobe and right  lower lobe. No left pleural effusion. Left lung clear. Pulmonary vascularity normal. No pneumothorax.  Prior sternotomy for CABG. Cardiac silhouette moderately enlarged but stable. Thoracic aorta atherosclerotic, unchanged. Degenerative changes involving the thoracic spine.  IMPRESSION: 1. Recurrent large right pleural effusion with associated dense passive atelectasis in the right lower lobe and right middle lobe. 2. Stable moderate cardiomegaly without pulmonary edema.    Electronically Signed   By: Evangeline Dakin M.D.   On: 10/04/2013 01:12    Assessment/Plan Principal Problem:   Pleural effusion, right Active Problems:   HTN (hypertension)   DM (diabetes mellitus)   Pleural effusion   Chronic kidney disease (CKD), stage IV (severe)  Recurrent pleural effusion- ED physician spoke to Dr. Ricard Dillon at [facility] surgery, who recommended the patient to be transferred to [facility] for possible VATS procedure. [facility]. I will hold the Lasix at this time,  Patient does not appear to be in significant respiratory stress.  Diabetes mellitus- we'll continue patient's Lantus and start him on carb modified diet Also initiate sliding scale insulin.  C KD stage III- patient's creatinine is 2.05 which is improved from previous 2.34  Hypertension- we'll continue lisinopril, metoprolol.  DVT prophylaxis- SCDs   Code status: Patient is full code  Family discussion: Discussed with patient's wife and sons at bedside   Time Spent on Admission: 73 min  [place] [facility] Pager: [phone] 10/04/2013, 3:29 AM  If 7PM-7AM, please contact night-coverage  [email or web address]  Password [email or web address]

## 2013-10-04 NOTE — Progress Notes (Signed)
Inpatient Diabetes Program Recommendations  AACE/ADA: New Consensus Statement on Inpatient Glycemic Control (2013)  Target Ranges:  Prepandial:   less than 140 mg/dL      Peak postprandial:   less than 180 mg/dL (1-2 hours)      Critically ill patients:  140 - 180 mg/dL   Results for Chris [name], Chris [name] (MRN [id number]) as of 10/04/2013 10:58  Ref. Range 10/04/2013 05:32 10/04/2013 07:20 10/04/2013 10:45  Glucose-Capillary Latest Range: 70-99 mg/dL 170 (H) 140 (H) 156 (H)    Inpatient Diabetes Program Recommendations Correction (SSI): Please order CBGs with Novolog correction scale ACHS. HgbA1C: Please order an A1C to evaluate glycemic control over the past 2-3 months.  Note:  Patient has a history of diabetes and has Levemir 40 units daily listed on home medication list with notation that (Patient stated Dr. Olena Heckle wants him to take 50 units daily, so patient is taking 40-41 units because of cost) for diabetes management.  Currently, patient is ordered to receive Levemir 40 units daily for glycemic control.  Noted patient is holding in the ED for transfer to [facility] for potential VATS.  Please order CBGs with Novolog correction scale ACHS and an A1C.  Will continue to follow.  Thanks, Barnie Alderman, RN, MSN, CCRN Diabetes Coordinator Inpatient Diabetes Program [phone] (Team Pager) [phone] ([facility] office) [phone] [facility] office)

## 2013-10-04 NOTE — ED Notes (Signed)
Pt c/o sob and upper abd pain.

## 2013-10-04 NOTE — ED Provider Notes (Signed)
CSN: [id number]     Arrival date & time 10/04/13  0001 History   First MD Initiated Contact with Patient 10/04/13 0003     Chief Complaint  Patient presents with  . Shortness of Breath   (Consider location/radiation/quality/duration/timing/severity/associated sxs/prior Treatment) HPI Comments: [name] is a 73 year old male who has a history of diabetes, stroke, atrial fibrillation and congestive heart failure. He has had recurrent right-sided thoracic pleural effusions which have become quite large and have required multiple procedures of thoracentesis for both diagnostic and therapeutic purposes. He presents today because of having recurrent shortness of breath that he states started yesterday. He feels as though he cannot take a deep breath, he does not have much of a cough, no fever, no swelling of his legs but he does feel like his abdomen is bloated and has excessive belching. This does not get worse with lying down, this does not get worse with exertion, it is not associated with chest pain. He does have a significant asbestos exposure in the past.  Review of the medical records shows that he has followed up with Dr. Roxan Hockey of cardiothoracic surgery who has performed thoracentesis, the results of these procedures have shown a transudate of the effusion thought to be benign as far. There have been no malignant cells in the effusions.  There has been discussion of perform a VATS procedure with a drain at the end of December but this has not yet been done as the visit was postponed.  Patient is a 73 y.o. male presenting with shortness of breath. The history is provided by the patient, medical records and the spouse.  Shortness of Breath   Past Medical History  Diagnosis Date  . 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 Diabetes mellitus   . Macrocytosis     normal B12 & folate levels  . Ischemic cardiomyopathy      EF of 45-50% - posterior & inferior walls are severly hypokinetic  . Atrial fibrillation     not candidate for chronic anticoagulation with Coumadin given chrronic epistaxis as well as hemorrhagic conversion of his acute cva   . Hyperlipidemia   . Hypertension   . Complication of anesthesia   . PONV (postoperative nausea and vomiting)   . Acute renal insufficiency     resolved  . Myocardial infarct   . CHF (congestive heart failure)    Past Surgical History  Procedure Laterality Date  . Coronary artery bypass graft    . Tonsillectomy    . Esophagogastroduodenoscopy (egd) with esophageal dilation N/A 10/27/2012    Dr. Gala Romney: distal esophageal diverticulum, non-complete Schatzki's ring s/p dilation, distal esophageal nodule with benign path, negative Barrett's  . Cholecystectomy  2008  . Colonoscopy N/A 12/23/2012    Procedure: COLONOSCOPY;  Surgeon: Daneil Dolin, MD;  Location: [facility];  Service: Endoscopy;  Laterality: N/A;  9:45  . Cardiac surgery     Family History  Problem Relation Age of Onset  . Colon cancer Neg Hx   . Colon polyps Sister   . Colon polyps Sister    History  Substance Use Topics  . Smoking status: Never Smoker   . Smokeless tobacco: Never Used  . Alcohol Use: No    Review of Systems  Respiratory: Positive for shortness of breath.   All other systems reviewed and are negative.    Allergies  Ciprofloxacin; Finasteride; and Xanax  Home Medications  Current Outpatient Rx  Name  Route  Sig  Dispense  Refill  . ondansetron (ZOFRAN) 4 MG tablet   Oral   Take 4 mg by mouth every 8 (eight) hours as needed for nausea or vomiting.         Marland Kitchen aspirin EC 81 MG tablet   Oral   Take 81 mg by mouth daily.         . Cholecalciferol (VITAMIN D-3) 5000 UNITS TABS   Oral   Take 5,000 Units by mouth daily.          . furosemide (LASIX) 80 MG tablet   Oral   Take 1 tablet (80 mg total) by mouth 2 (two) times daily.   60 tablet   0   .  Insulin Detemir (LEVEMIR FLEXPEN) 100 UNIT/ML SOPN   Subcutaneous   Inject 40 Units into the skin daily.   2 pen   2   . lisinopril (PRINIVIL,ZESTRIL) 20 MG tablet   Oral   Take 1 tablet by mouth daily with lunch.         Marland Kitchen LORazepam (ATIVAN) 0.5 MG tablet   Oral   Take 0.5 mg by mouth 2 (two) times daily as needed for anxiety.         . lovastatin (MEVACOR) 40 MG tablet   Oral   Take 40 mg by mouth at bedtime.           Marland Kitchen lubiprostone (AMITIZA) 24 MCG capsule   Oral   Take 24 mcg by mouth daily with breakfast. 1/2 tablet per day         . metoprolol (LOPRESSOR) 50 MG tablet   Oral   Take 50 mg by mouth 2 (two) times daily.           . prednisoLONE acetate (PRED FORTE) 1 % ophthalmic suspension   Both Eyes   Place 1 drop into both eyes 4 (four) times daily as needed (Dry Eyes).         . predniSONE (STERAPRED UNI-PAK) 10 MG tablet      Take 5 tablets by mouth day 1, 4 tablets on day 2, 3 tablets on day 3, 2 tablets on day 4, 1 tablet on day 5, then discontinue   15 tablet   0   . simethicone (GAS-X) 80 MG chewable tablet   Oral   Chew 80 mg by mouth every 6 (six) hours as needed for flatulence.          BP 127/68  Pulse 86  Temp(Src) 98.1 F (36.7 C) (Oral)  Resp 20  Wt 220 lb (99.791 kg)  SpO2 96% Physical Exam  Nursing note and vitals reviewed. Constitutional: He appears well-developed and well-nourished. No distress.  HENT:  Head: Normocephalic and atraumatic.  Mouth/Throat: Oropharynx is clear and moist. No oropharyngeal exudate.  Eyes: Conjunctivae and EOM are normal. Pupils are equal, round, and reactive to light. Right eye exhibits no discharge. Left eye exhibits no discharge. No scleral icterus.  Neck: Normal range of motion. Neck supple. No JVD present. No thyromegaly present.  Cardiovascular: Normal rate, regular rhythm, normal heart sounds and intact distal pulses.  Exam reveals no gallop and no friction rub.   No murmur  heard. Pulmonary/Chest: Effort normal. No respiratory distress. He has no wheezes. He has no rales.  Absent breath sounds in the right lung two thirds of the way up the chest. Dullness to percussion over the right hemithorax in the lower two thirds.  Normal breath sounds on the left, no respiratory distress, no increased work of breathing  Abdominal: Soft. Bowel sounds are normal. He exhibits no distension and no mass. There is no tenderness.  Soft abdomen, mildly distended, no tenderness or guarding, no discrete masses are palpated  Musculoskeletal: Normal range of motion. He exhibits no edema and no tenderness.  Lymphadenopathy:    He has no cervical adenopathy.  Neurological: He is alert. Coordination normal.  Skin: Skin is warm and dry. No rash noted. No erythema.  Psychiatric: He has a normal mood and affect. His behavior is normal.    ED Course  Procedures (including critical care time) Labs Review Labs Reviewed  CBC WITH DIFFERENTIAL - Abnormal; Notable for the following:    RBC 3.50 (*)    Hemoglobin 12.3 (*)    HCT 34.4 (*)    MCH 35.1 (*)    All other components within normal limits  COMPREHENSIVE METABOLIC PANEL - Abnormal; Notable for the following:    Glucose, Bld 231 (*)    BUN 52 (*)    Creatinine, Ser 2.05 (*)    Total Protein 8.5 (*)    GFR calc non Af Amer 30 (*)    GFR calc Af Amer 35 (*)    All other components within normal limits  URINALYSIS, ROUTINE W REFLEX MICROSCOPIC - Abnormal; Notable for the following:    Hgb urine dipstick TRACE (*)    Protein, ur TRACE (*)    All other components within normal limits  URINE MICROSCOPIC-ADD ON - Abnormal; Notable for the following:    Squamous Epithelial / LPF FEW (*)    Bacteria, UA FEW (*)    Casts HYALINE CASTS (*)    All other components within normal limits  GLUCOSE, CAPILLARY - Abnormal; Notable for the following:    Glucose-Capillary 170 (*)    All other components within normal limits  GLUCOSE, CAPILLARY  - Abnormal; Notable for the following:    Glucose-Capillary 140 (*)    All other components within normal limits  LIPASE, BLOOD   Imaging Review Dg Chest 2 View  10/04/2013   CLINICAL DATA:  Shortness of breath. Prior CABG. Current history of ischemic cardiomyopathy, atrial fibrillation, diabetes, and hypertension.  EXAM: CHEST  2 VIEW  COMPARISON:  08/25/2013, 08/15/2013.  CT chest 08/06/2013.  FINDINGS: Recurrent large right pleural effusion and associated dense passive atelectasis in the right middle lobe and right lower lobe. No left pleural effusion. Left lung clear. Pulmonary vascularity normal. No pneumothorax.  Prior sternotomy for CABG. Cardiac silhouette moderately enlarged but stable. Thoracic aorta atherosclerotic, unchanged. Degenerative changes involving the thoracic spine.  IMPRESSION: 1. Recurrent large right pleural effusion with associated dense passive atelectasis in the right lower lobe and right middle lobe. 2. Stable moderate cardiomegaly without pulmonary edema.   Electronically Signed   By: Evangeline Dakin M.D.   On: 10/04/2013 01:12    EKG Interpretation   None       MDM   1. Pleural effusion   2. DM (diabetes mellitus)   3. HTN (hypertension)    The CT scan that the patient had in November showed no signs of abdominal abnormalities despite having the same abdominal discomfort at that time. I suspect that he has had a recurrent effusion in his chest, chest x-ray pending, obtain laboratory workup as well. This does not appear to be cardiac in nature, physical exam points to a recurrent pleural effusion. Vital signs show no fever, no hypoxia,  no tachycardia and the patient is normotensive for age.  Plain films of the chest show a recurrent large right-sided pleural effusion. Labs are otherwise unremarkable and at the patient's baseline.  I discussed the patient's care with the cardiothoracic surgeon Dr. Ricard Dillon, he agrees to provide consultation in the morning.  Discussed care with Dr. Darrick Meigs on the hospitalist service who will arrange for transport and transfer of the patient to [facility].  Johnna Acosta, MD 10/04/13 [phone]

## 2013-10-04 NOTE — ED Notes (Signed)
Pt told [facility] staff he was feeling nauseated.  Rechecked cbg, cbg 40.  [facility] administered 1 amp d 40 and notified [facility] manager.

## 2013-10-04 NOTE — ED Notes (Signed)
Report called to 2W to Chilton Greathouse RN, [facility] enroute.

## 2013-10-04 NOTE — ED Notes (Signed)
Update from Bed Control, "they are cleaning some beds and it should not be much longer".  Nurse informed.

## 2013-10-04 NOTE — Progress Notes (Signed)
Pt arrived on unit via [facility]. Vital signs stable. CBG taken. MD paged and aware of hypoglycemic event during transport. New orders given and activated. Will Continue to monitor.

## 2013-10-04 NOTE — ED Notes (Signed)
Dr. Lacinda Axon aware.

## 2013-10-04 NOTE — ED Notes (Signed)
Pt CBG: 156

## 2013-10-05 ENCOUNTER — Inpatient Hospital Stay (HOSPITAL_COMMUNITY): Payer: Medicare HMO

## 2013-10-05 DIAGNOSIS — J90 Pleural effusion, not elsewhere classified: Secondary | ICD-10-CM

## 2013-10-05 DIAGNOSIS — I10 Essential (primary) hypertension: Secondary | ICD-10-CM

## 2013-10-05 DIAGNOSIS — R109 Unspecified abdominal pain: Secondary | ICD-10-CM

## 2013-10-05 DIAGNOSIS — E119 Type 2 diabetes mellitus without complications: Secondary | ICD-10-CM

## 2013-10-05 DIAGNOSIS — N184 Chronic kidney disease, stage 4 (severe): Secondary | ICD-10-CM

## 2013-10-05 HISTORY — DX: Pleural effusion, not elsewhere classified: J90

## 2013-10-05 LAB — GLUCOSE, CAPILLARY
GLUCOSE-CAPILLARY: 148 mg/dL — AB (ref 70–99)
GLUCOSE-CAPILLARY: 38 mg/dL — AB (ref 70–99)
Glucose-Capillary: 109 mg/dL — ABNORMAL HIGH (ref 70–99)
Glucose-Capillary: 110 mg/dL — ABNORMAL HIGH (ref 70–99)
Glucose-Capillary: 147 mg/dL — ABNORMAL HIGH (ref 70–99)
Glucose-Capillary: 158 mg/dL — ABNORMAL HIGH (ref 70–99)
Glucose-Capillary: 92 mg/dL (ref 70–99)
Glucose-Capillary: 95 mg/dL (ref 70–99)

## 2013-10-05 MED ORDER — DEXTROSE 50 % IV SOLN: 25.0000 mL | INTRAVENOUS | Status: DC | PRN

## 2013-10-05 MED ORDER — DEXTROSE 50 % IV SOLN
INTRAVENOUS | Status: AC
  Administered 2013-10-05: 50 mL
  Filled 2013-10-05: qty 50

## 2013-10-05 MED ORDER — INSULIN ASPART 100 UNIT/ML ~~LOC~~ SOLN
0.0000 [IU] | SUBCUTANEOUS | Status: DC
Start: 2013-10-05 — End: 2013-10-07
  Administered 2013-10-05: 2 [IU] via SUBCUTANEOUS
  Administered 2013-10-05 – 2013-10-06 (×2): 1 [IU] via SUBCUTANEOUS
  Administered 2013-10-06: 5 [IU] via SUBCUTANEOUS
  Administered 2013-10-06: 3 [IU] via SUBCUTANEOUS
  Administered 2013-10-07: 5 [IU] via SUBCUTANEOUS
  Administered 2013-10-07: 3 [IU] via SUBCUTANEOUS
  Administered 2013-10-07: 5 [IU] via SUBCUTANEOUS

## 2013-10-05 MED ORDER — IOHEXOL 300 MG/ML  SOLN
25.0000 mL | INTRAMUSCULAR | Status: AC
  Administered 2013-10-05 (×2): 25 mL via ORAL

## 2013-10-05 MED ORDER — DEXTROSE 50 % IV SOLN: 1.0000 | INTRAVENOUS | Status: DC | PRN

## 2013-10-05 MED ORDER — DEXTROSE-NACL 5-0.9 % IV SOLN
INTRAVENOUS | Status: DC
  Administered 2013-10-05: 03:00:00 via INTRAVENOUS

## 2013-10-05 NOTE — Progress Notes (Signed)
Utilization Review Completed.Donne Anon T1/14/2015

## 2013-10-05 NOTE — Progress Notes (Signed)
10/05/2013 12:32 PM Nursing note During hourly round, pt. States Dr. Roxan Hockey was not planning to do any procedures today and pt voicing interest in eating solid foods. Dr. Coralyn Pear paged and made aware. Verbal orders received to resume Diabetic diet and d/c D5NS IVF. Orders enacted and pt. Updated on plan. Will continue to monitor. Sharlon Pfohl, Arville Lime

## 2013-10-05 NOTE — Progress Notes (Signed)
[facility] PROGRESS NOTE  Chris [name] ZOX:[id number] DOB: 05-18-1941 DOA: 10/04/2013 PCP: Gar Ponto, MD  Assessment/Plan: 1. Recurrence right-sided pleural effusions. Patient currently following Dr Roxan Hockey of the CT surgery. We discussed case. A respiratory standpoint appears to be stable, recommended working up cause of abdominal pain. 2. Abdominal pain. Patient reporting having abdominal distention, bloating, with some epigastric pain. Lab work showed a white count within normal limits at 7800. Will obtain a CT scan of abdomen and pelvis without IV contrast given the presence of chronic kidney disease. 3. 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 Baseline creatinine near 2.0. A.m. lab work done on 10/04/2048 showed a creatinine of 2.05. 4. Insulin-dependent diabetes mellitus. Continue Detemir 40 units subcutaneous daily, Accu-Cheks Q. a.c. each bedtime  Code Status: Full Code Disposition Plan: Workup patient's abdominal symptoms with CT scan today.   Consultants:  CT surgery, Dr. Roxan Hockey    HPI/Subjective: Patient is a 73 year old gentleman with a history of recurrent right pleural effusions undergoing multiple thoracentesis procedures, presented as a transfer from [facility] on 10/04/2013, patient presenting with reports of shortness of breath, imaging studies showing reaccumulation of large right pleural effusion. Dr. Roxan Hockey of CT surgery was consulted as there was discussion about the possibility of VATS. This morning patient reporting abdominal bloating, having some pain over the epigastric region. Otherwise states his shortness of breath has not worsened any since admission.  Objective: Filed Vitals:   10/05/13 1345  BP: 111/74  Pulse: 90  Temp: 98.1 F (36.7 C)  Resp: 18    Intake/Output Summary (Last 24 hours) at 10/05/13 1610 Last data filed at 10/05/13 0800  Gross per 24 hour  Intake    480 ml  Output      0 ml  Net    480 ml   Filed  Weights   10/04/13 0009 10/04/13 0012 10/04/13 1744  Weight: 102.059 kg (225 lb) 99.791 kg (220 lb) 95.936 kg (211 lb 8 oz)    Exam:   General:  Patient does not appear in acute distress awake alert oriented  Cardiovascular: Regular rate rhythm normal S1-S2  Respiratory: Diminished breath sounds to right lung, positive crackles, normal respiratory effort. He does not appear to be an active respiratory distress.  Abdomen: Patient reporting pain to palpation over epigastric region, no peritoneal signs, no rebound tenderness or guarding.  Musculoskeletal: No edema  Data Reviewed: Basic Metabolic Panel:  Recent Labs Lab 10/04/13 0022  NA 137  K 4.9  CL 96  CO2 28  GLUCOSE 231*  BUN 52*  CREATININE 2.05*  CALCIUM 9.5   Liver Function Tests:  Recent Labs Lab 10/04/13 0022  AST 28  ALT 20  ALKPHOS 81  BILITOT 1.0  PROT 8.5*  ALBUMIN 3.9    Recent Labs Lab 10/04/13 0022  LIPASE 30   No results found for this basename: AMMONIA,  in the last 168 hours CBC:  Recent Labs Lab 10/04/13 0022  WBC 7.8  NEUTROABS 5.3  HGB 12.3*  HCT 34.4*  MCV 98.3  PLT 222   Cardiac Enzymes: No results found for this basename: CKTOTAL, CKMB, CKMBINDEX, TROPONINI,  in the last 168 hours BNP (last 3 results)  Recent Labs  08/03/13 1347  PROBNP 1834.0*   CBG:  Recent Labs Lab 10/05/13 0009 10/05/13 0240 10/05/13 0308 10/05/13 0434 10/05/13 1139  GLUCAP 109* 38* 148* 92 110*    No results found for this or any previous visit (from the past 240 hour(s)).  Studies: Dg Chest 2 View  10/04/2013   CLINICAL DATA:  Shortness of breath. Prior CABG. Current history of ischemic cardiomyopathy, atrial fibrillation, diabetes, and hypertension.  EXAM: CHEST  2 VIEW  COMPARISON:  08/25/2013, 08/15/2013.  CT chest 08/06/2013.  FINDINGS: Recurrent large right pleural effusion and associated dense passive atelectasis in the right middle lobe and right lower lobe. No left pleural  effusion. Left lung clear. Pulmonary vascularity normal. No pneumothorax.  Prior sternotomy for CABG. Cardiac silhouette moderately enlarged but stable. Thoracic aorta atherosclerotic, unchanged. Degenerative changes involving the thoracic spine.  IMPRESSION: 1. Recurrent large right pleural effusion with associated dense passive atelectasis in the right lower lobe and right middle lobe. 2. Stable moderate cardiomegaly without pulmonary edema.   Electronically Signed   By: Evangeline Dakin M.D.   On: 10/04/2013 01:12    Scheduled Meds: . insulin aspart  0-9 Units Subcutaneous Q4H  . insulin detemir  40 Units Subcutaneous Daily  . lisinopril  20 mg Oral Q lunch  . metoprolol  50 mg Oral BID  . simvastatin  20 mg Oral q1800  . sodium chloride  3 mL Intravenous Q12H   Continuous Infusions:   Principal Problem:   Pleural effusion, right Active Problems:   HTN (hypertension)   DM (diabetes mellitus)   Pleural effusion   Chronic kidney disease (CKD), stage IV (severe)    Time spent: 35 minutes    Kelvin Cellar  [facility] Pager [phone]. If 7PM-7AM, please contact night-coverage at [email or web address], password [facility] 10/05/2013, 4:10 PM  LOS: 1 day

## 2013-10-05 NOTE — Progress Notes (Signed)
Pt CBG 38.  Pt NPO at time. Pt asymptomatic.  Md on call notified. New orders received.  1 Amp of D50 given. CBG now 148.  D5NS @50  also initiated per order.  Will continue to monitor pt closely.  Chris [name]

## 2013-10-05 NOTE — Consult Note (Signed)
Reason for Consult:Recurrent right pleural effusion Referring Physician: Dr. Christoper Fabian  Chris [name] is an 73 y.o. male.  HPI: 73 yo admitted after presenting to ED with a c/o SOB whose cc/o now is abdominal distention.  73 year old gentleman with multiple medical problems including coronary artery disease. I did coronary bypass grafting on him back in 2002. 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   He has been having issues with abdominal discomfort including bloating and constipation for a long time. On November 1 he was admitted after presenting with complaints of abdominal discomfort. A CT of the abdomen showed a right pleural effusion with dependent atelectasis and possibly an underlying community-acquired pneumonia. During the hospitalization he had a thoracentesis which showed serous fluid with a mildly elevated LDH at 89. Cytology showed mostly reactive mesothelial cells, but there were a few atypical cells which could not be further characterized. He was treated presumptively for pneumonia.   He was readmitted on 08/03/2013 with a chief complaint of shortness of breath. His pleural effusion had recurred. He was seen in consultation by pulmonary. Given the findings on his previous thoracentesis, which were consistent with a transudative effusion, they advised diuresis.   I saw him in the office in December. He was still c/o SOB and abdominal distention. He also complained that the abdominal bloating caused changes in his heart rhythm. He had lost 25 pounds with diuresis and his peripheral edema had resolved, but a repeat CXR showed his effusion had recurred.  I advised him then to have a VATS to drain the effusion, do biopsies and place a tunneled pleural catheter. He refused saying his wife was going to have surgery soon and he had to be available for her. I did a thoracentesis for  symptomatic relief and gave him empiric prednisone. He was supposed to follow up with me after 3 weeks but didn't come to his appointment.  He went to ED yesterday c/o SOB and again abdominal bloating. He was transferred here for management. Today he is not complaining of shortness of breath but is holding his abdomen saying he thinks he is going to die tonight. He c/o bloating and abdominal pain. He did have a BM this afternoon but says it was small.   Past Medical History  Diagnosis Date  . 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 Diabetes mellitus   . Macrocytosis     normal B12 & folate levels  . Ischemic cardiomyopathy     EF of 45-50% - posterior & inferior walls are severly hypokinetic  . Atrial fibrillation     not candidate for chronic anticoagulation with Coumadin given chrronic epistaxis as well as hemorrhagic conversion of his acute cva   . Hyperlipidemia   . Hypertension   . Complication of anesthesia   . PONV (postoperative nausea and vomiting)   . Acute renal insufficiency     resolved  . Myocardial infarct   . CHF (congestive heart failure)     Past Surgical History  Procedure Laterality Date  . Coronary artery bypass graft    . Tonsillectomy    . Esophagogastroduodenoscopy (egd) with esophageal dilation N/A 10/27/2012    Dr. Gala Romney: distal esophageal diverticulum, non-complete Schatzki's ring s/p dilation, distal esophageal nodule with benign path, negative Barrett's  . Cholecystectomy  2008  . Colonoscopy N/A 12/23/2012    Procedure:  COLONOSCOPY;  Surgeon: Daneil Dolin, MD;  Location: [facility];  Service: Endoscopy;  Laterality: N/A;  9:45  . Cardiac surgery      Family History  Problem Relation Age of Onset  . Colon cancer Neg Hx   . Colon polyps Sister   . Colon polyps Sister     Social History:  reports that he has never smoked. He has never used smokeless tobacco. He reports  that he does not drink alcohol or use illicit drugs.  Allergies:  Allergies  Allergen Reactions  . Ciprofloxacin Other (See Comments)    Sores all on legs.   . Finasteride     Affected  Patients testicles  . Metoclopramide     Jerking all over  . Xanax [Alprazolam] Other (See Comments)    Not in right state of mind.     Medications:  Prior to Admission:  Prescriptions prior to admission  Medication Sig Dispense Refill  . aspirin EC 81 MG tablet Take 81 mg by mouth daily.      . Cholecalciferol (VITAMIN D-3) 5000 UNITS TABS Take 5,000 Units by mouth daily.       . furosemide (LASIX) 40 MG tablet Take 40 mg by mouth 2 (two) times daily.      . Insulin Detemir (LEVEMIR FLEXPEN) 100 UNIT/ML SOPN Inject 40 Units into the skin daily.  2 pen  2  . lisinopril (PRINIVIL,ZESTRIL) 20 MG tablet Take 1 tablet by mouth daily with lunch.      . lovastatin (MEVACOR) 40 MG tablet Take 40 mg by mouth at bedtime.        Marland Kitchen lubiprostone (AMITIZA) 24 MCG capsule Take 24 mcg by mouth every other day.      . Magnesium 250 MG TABS Take 250 mg by mouth daily. OTC      . metoprolol (LOPRESSOR) 50 MG tablet Take 50 mg by mouth 2 (two) times daily.        . prednisoLONE acetate (PRED FORTE) 1 % ophthalmic suspension Place 1 drop into both eyes 4 (four) times daily as needed (Dry Eyes).      . LORazepam (ATIVAN) 0.5 MG tablet Take 0.5 mg by mouth 2 (two) times daily as needed for anxiety.        Results for orders placed during the hospital encounter of 10/04/13 (from the past 48 hour(s))  CBC WITH DIFFERENTIAL     Status: Abnormal   Collection Time    10/04/13 12:22 AM      Result Value Range   WBC 7.8  4.0 - 10.5 K/uL   RBC 3.50 (*) 4.22 - 5.81 MIL/uL   Hemoglobin 12.3 (*) 13.0 - 17.0 g/dL   HCT 34.4 (*) 39.0 - 52.0 %   MCV 98.3  78.0 - 100.0 fL   MCH 35.1 (*) 26.0 - 34.0 pg   MCHC 35.8  30.0 - 36.0 g/dL   RDW 12.3  11.5 - 15.5 %   Platelets 222  150 - 400 K/uL   Neutrophils Relative % 68  43 - 77  %   Neutro Abs 5.3  1.7 - 7.7 K/uL   Lymphocytes Relative 21  12 - 46 %   Lymphs Abs 1.7  0.7 - 4.0 K/uL   Monocytes Relative 10  3 - 12 %   Monocytes Absolute 0.8  0.1 - 1.0 K/uL   Eosinophils Relative 1  0 - 5 %   Eosinophils Absolute 0.1  0.0 - 0.7 K/uL  Basophils Relative 0  0 - 1 %   Basophils Absolute 0.0  0.0 - 0.1 K/uL  COMPREHENSIVE METABOLIC PANEL     Status: Abnormal   Collection Time    10/04/13 12:22 AM      Result Value Range   Sodium 137  137 - 147 mEq/L   Potassium 4.9  3.7 - 5.3 mEq/L   Chloride 96  96 - 112 mEq/L   CO2 28  19 - 32 mEq/L   Glucose, Bld 231 (*) 70 - 99 mg/dL   BUN 52 (*) 6 - 23 mg/dL   Creatinine, Ser 2.05 (*) 0.50 - 1.35 mg/dL   Calcium 9.5  8.4 - 10.5 mg/dL   Total Protein 8.5 (*) 6.0 - 8.3 g/dL   Albumin 3.9  3.5 - 5.2 g/dL   AST 28  0 - 37 U/L   ALT 20  0 - 53 U/L   Alkaline Phosphatase 81  39 - 117 U/L   Total Bilirubin 1.0  0.3 - 1.2 mg/dL   GFR calc non Af Amer 30 (*) >90 mL/min   GFR calc Af Amer 35 (*) >90 mL/min   Comment: (NOTE)     The eGFR has been calculated using the CKD EPI equation.     This calculation has not been validated in all clinical situations.     eGFR's persistently <90 mL/min signify possible Chronic Kidney     Disease.  LIPASE, BLOOD     Status: None   Collection Time    10/04/13 12:22 AM      Result Value Range   Lipase 30  11 - 59 U/L  URINALYSIS, ROUTINE W REFLEX MICROSCOPIC     Status: Abnormal   Collection Time    10/04/13  1:03 AM      Result Value Range   Color, Urine YELLOW  YELLOW   APPearance CLEAR  CLEAR   Specific Gravity, Urine 1.020  1.005 - 1.030   pH 5.5  5.0 - 8.0   Glucose, UA NEGATIVE  NEGATIVE mg/dL   Hgb urine dipstick TRACE (*) NEGATIVE   Bilirubin Urine NEGATIVE  NEGATIVE   Ketones, ur NEGATIVE  NEGATIVE mg/dL   Protein, ur TRACE (*) NEGATIVE mg/dL   Urobilinogen, UA 0.2  0.0 - 1.0 mg/dL   Nitrite NEGATIVE  NEGATIVE   Leukocytes, UA NEGATIVE  NEGATIVE  URINE MICROSCOPIC-ADD  ON     Status: Abnormal   Collection Time    10/04/13  1:03 AM      Result Value Range   Squamous Epithelial / LPF FEW (*) RARE   WBC, UA 0-2  <3 WBC/hpf   RBC / HPF 0-2  <3 RBC/hpf   Bacteria, UA FEW (*) RARE   Casts HYALINE CASTS (*) NEGATIVE  GLUCOSE, CAPILLARY     Status: Abnormal   Collection Time    10/04/13  5:32 AM      Result Value Range   Glucose-Capillary 170 (*) 70 - 99 mg/dL  GLUCOSE, CAPILLARY     Status: Abnormal   Collection Time    10/04/13  7:20 AM      Result Value Range   Glucose-Capillary 140 (*) 70 - 99 mg/dL  GLUCOSE, CAPILLARY     Status: Abnormal   Collection Time    10/04/13 10:45 AM      Result Value Range   Glucose-Capillary 156 (*) 70 - 99 mg/dL   Comment 1 Documented in Chart     Comment 2  Notify RN    GLUCOSE, CAPILLARY     Status: Abnormal   Collection Time    10/04/13  4:40 PM      Result Value Range   Glucose-Capillary 40 (*) 70 - 99 mg/dL  GLUCOSE, CAPILLARY     Status: Abnormal   Collection Time    10/04/13  4:47 PM      Result Value Range   Glucose-Capillary 38 (*) 70 - 99 mg/dL   Comment 1 Notify RN    GLUCOSE, CAPILLARY     Status: None   Collection Time    10/04/13  5:42 PM      Result Value Range   Glucose-Capillary 94  70 - 99 mg/dL  GLUCOSE, CAPILLARY     Status: Abnormal   Collection Time    10/04/13  8:12 PM      Result Value Range   Glucose-Capillary 130 (*) 70 - 99 mg/dL   Comment 1 Documented in Chart     Comment 2 Notify RN    GLUCOSE, CAPILLARY     Status: Abnormal   Collection Time    10/05/13 12:09 AM      Result Value Range   Glucose-Capillary 109 (*) 70 - 99 mg/dL  GLUCOSE, CAPILLARY     Status: Abnormal   Collection Time    10/05/13  2:40 AM      Result Value Range   Glucose-Capillary 38 (*) 70 - 99 mg/dL  GLUCOSE, CAPILLARY     Status: Abnormal   Collection Time    10/05/13  3:08 AM      Result Value Range   Glucose-Capillary 148 (*) 70 - 99 mg/dL  GLUCOSE, CAPILLARY     Status: None   Collection  Time    10/05/13  4:34 AM      Result Value Range   Glucose-Capillary 92  70 - 99 mg/dL  GLUCOSE, CAPILLARY     Status: Abnormal   Collection Time    10/05/13 11:39 AM      Result Value Range   Glucose-Capillary 110 (*) 70 - 99 mg/dL   Comment 1 Notify RN      Dg Chest 2 View  10/04/2013   CLINICAL DATA:  Shortness of breath. Prior CABG. Current history of ischemic cardiomyopathy, atrial fibrillation, diabetes, and hypertension.  EXAM: CHEST  2 VIEW  COMPARISON:  08/25/2013, 08/15/2013.  CT chest 08/06/2013.  FINDINGS: Recurrent large right pleural effusion and associated dense passive atelectasis in the right middle lobe and right lower lobe. No left pleural effusion. Left lung clear. Pulmonary vascularity normal. No pneumothorax.  Prior sternotomy for CABG. Cardiac silhouette moderately enlarged but stable. Thoracic aorta atherosclerotic, unchanged. Degenerative changes involving the thoracic spine.  IMPRESSION: 1. Recurrent large right pleural effusion with associated dense passive atelectasis in the right lower lobe and right middle lobe. 2. Stable moderate cardiomegaly without pulmonary edema.   Electronically Signed   By: Evangeline Dakin M.D.   On: 10/04/2013 01:12    Review of Systems  Constitutional: Negative for fever and chills.  Respiratory: Positive for shortness of breath.   Cardiovascular: Positive for leg swelling.  Gastrointestinal: Positive for abdominal pain and constipation.       Severe bloating  Psychiatric/Behavioral: Positive for depression.   Blood pressure 111/74, pulse 90, temperature 98.1 F (36.7 C), temperature source Oral, resp. rate 18, height $RemoveBe'6\' 2"'eWdDdtiHO$  (1.88 m), weight 211 lb 8 oz (95.936 kg), SpO2 97.00%. Physical Exam  Vitals reviewed. Constitutional: He is  oriented to person, place, and time. He appears well-developed and well-nourished. He appears distressed ("holding abdomen saying he thinks he is going to die").  HENT:  Head: Normocephalic and  atraumatic.  Eyes: EOM are normal. Pupils are equal, round, and reactive to light.  Neck: Neck supple. No thyromegaly present.  Cardiovascular: Normal rate, regular rhythm and normal heart sounds.   Respiratory: Effort normal. He has no wheezes.  Absent BS right base  GI: Soft. He exhibits distension. There is no tenderness. There is no rebound.  Musculoskeletal: He exhibits no edema.  Lymphadenopathy:    He has no cervical adenopathy.  Neurological: He is alert and oriented to person, place, and time. No cranial nerve deficit.  No focal deficit    Assessment/Plan: 73 yo male with a recurrent right pleural effusion. The etiology of the effusion is unknown, but most recently was exudative. Despite the effusion he is 97% saturated on room air and in no respiratory distress. This morning I d/w with the patient and his wife the possibility of a right VATS to drain the effusion and place a pleural catheter. I was called later by the RN saying he did not want to have it placed and had more questions for me.  When I returned this afternoon his complaints are all abdominal in nature. He is holding his lower abdomen and is having a hard time getting comfortable. Despite his appearance his abdomen is benign on exam. However, since his symptoms are abdominal in nature, I think that should be addressed before we deal with his pleural effusion. If he becomes symptomatic from the effusion we can do a temporizing thoracentesis.  Hunter Bachar C 10/05/2013, 3:58 PM

## 2013-10-05 NOTE — Progress Notes (Addendum)
Inpatient Diabetes Program Recommendations  AACE/ADA: New Consensus Statement on Inpatient Glycemic Control (2013)  Target Ranges:  Prepandial:   less than 140 mg/dL      Peak postprandial:   less than 180 mg/dL (1-2 hours)      Critically ill patients:  140 - 180 mg/dL   Results for TRISTAIN, DAILY (MRN [id number]) as of 10/05/2013 09:53  Ref. Range 10/04/2013 16:40 10/04/2013 16:47 10/04/2013 17:42 10/04/2013 20:12 10/05/2013 00:09 10/05/2013 02:40 10/05/2013 03:08 10/05/2013 04:34  Glucose-Capillary Latest Range: 70-99 mg/dL 40 (LL) 38 (LL) 94 130 (H) 109 (H) 38 (LL) 148 (H) 92   Inpatient Diabetes Program Recommendations Insulin - Basal: Please decrease Levemir to 20 units daily. Correction (SSI): Please order CBGs with Novolog sensitive correction scale Q4H. HgbA1C: Please order an A1C to evaluate glycemic control over the past 2-3 months.  Note: Talked with Colletta Maryland, RN about Levemir dose.  Colletta Maryland, RN informed me that patient is a type 1 diabetic and is currently NPO.  Patient was given Levemir 40 units yesterday at 11:17 in the [facility] ED and has had two episode of hypoglycemia since then.  Colletta Maryland, RN also reports that the morning dose of Levemir 40 units is not going to be given (as ordered by Dr. Coralyn Pear).    If patient is truly a type 1 diabetic, he will need basal insulin as he does not produce any insulin.  Therefore, please consider decreasing Levemir to 20 units daily, ordering Novolog sensitive correction Q4H, and ordering an A1C.  Will continue to follow.  Thanks, Barnie Alderman, RN, MSN, CCRN Diabetes Coordinator Inpatient Diabetes Program [phone] (Team Pager) [phone] ([facility] office) [phone] [facility] office)

## 2013-10-05 NOTE — Progress Notes (Signed)
10/05/2013 0830 Nursing note Upon am Rn assessment, pt. Informed RN that he was type 1 diabetic and had been since age 73. Dr. Coralyn Pear on floor and made aware of this information as well as pt. Hypoglycemic episodes from this am. Verbal orders to hold am dose of Levemir due to NPO status and hypoglycemia this morning. Will continue to monitor patient.  Chris [name], Arville Lime

## 2013-10-06 ENCOUNTER — Inpatient Hospital Stay (HOSPITAL_COMMUNITY): Payer: Medicare HMO

## 2013-10-06 ENCOUNTER — Encounter (HOSPITAL_COMMUNITY): Payer: Self-pay | Admitting: General Practice

## 2013-10-06 DIAGNOSIS — I959 Hypotension, unspecified: Secondary | ICD-10-CM

## 2013-10-06 DIAGNOSIS — K5900 Constipation, unspecified: Secondary | ICD-10-CM

## 2013-10-06 LAB — GLUCOSE, CAPILLARY
GLUCOSE-CAPILLARY: 147 mg/dL — AB (ref 70–99)
GLUCOSE-CAPILLARY: 171 mg/dL — AB (ref 70–99)
GLUCOSE-CAPILLARY: 213 mg/dL — AB (ref 70–99)
GLUCOSE-CAPILLARY: 243 mg/dL — AB (ref 70–99)
GLUCOSE-CAPILLARY: 289 mg/dL — AB (ref 70–99)
Glucose-Capillary: 115 mg/dL — ABNORMAL HIGH (ref 70–99)
Glucose-Capillary: 148 mg/dL — ABNORMAL HIGH (ref 70–99)
Glucose-Capillary: 231 mg/dL — ABNORMAL HIGH (ref 70–99)
Glucose-Capillary: 257 mg/dL — ABNORMAL HIGH (ref 70–99)

## 2013-10-06 LAB — GLUCOSE, SEROUS FLUID: GLUCOSE FL: 154 mg/dL

## 2013-10-06 LAB — LACTATE DEHYDROGENASE, PLEURAL OR PERITONEAL FLUID: LD, Fluid: 129 U/L — ABNORMAL HIGH (ref 3–23)

## 2013-10-06 LAB — PROTEIN, BODY FLUID: Total protein, fluid: 4.4 g/dL

## 2013-10-06 MED ORDER — LUBIPROSTONE 24 MCG PO CAPS
24.0000 ug | ORAL_CAPSULE | Freq: Two times a day (BID) | ORAL | Status: DC
  Filled 2013-10-06 (×5): qty 1

## 2013-10-06 NOTE — Progress Notes (Signed)
I had a long discussion with Chris [name] this morning  He had a difficult time last night. Had difficulty with SOB and orthopnea  BP 131/63  Pulse 85  Temp(Src) 98.2 F (36.8 C) (Oral)  Resp 20  Ht 6\' 2"  (1.88 m)  Wt 211 lb 8 oz (95.936 kg)  BMI 27.14 kg/m2  SpO2 97%   Intake/Output Summary (Last 24 hours) at 10/06/13 0954 Last data filed at 10/05/13 1700  Gross per 24 hour  Intake    360 ml  Output      0 ml  Net    360 ml    CT ABDOMEN AND PELVIS WITHOUT CONTRAST  TECHNIQUE:  Multidetector CT imaging of the abdomen and pelvis was performed  following the standard protocol without intravenous contrast.  COMPARISON: 08/06/2013  FINDINGS:  LOWER CHEST: Coronary artery atherosclerosis. Large, water density  right pleural effusion.  ABDOMEN/PELVIS:  Liver: No focal abnormality.  Biliary: Cholecystectomy.  Pancreas: Diffuse atrophy, without ductal dilatation.  Spleen: Unremarkable.  Adrenals: Unremarkable.  Kidneys and ureters: Bilateral renal cortical thinning. No  hydronephrosis or nephrolithiasis.  Bladder: Circumferential bladder wall thickening, likely from  chronic outlet obstruction.  .  Reproductive: Enlarged prostate, measuring 6 cm in transverse span  and deforming the bladder base.  Bowel: No bowel obstruction. Thickening of the posteromedial wall of  the mid duodenum is stable dating back to 2009, likely redundant  folds. No pericecal inflammation.  Retroperitoneum: No mass or adenopathy.  Peritoneum: No free fluid or gas.  Vascular: Diffuse arterial calcification.  OSSEOUS: Remote L1 superior endplate fracture. No acute osseous  findings.  IMPRESSION:  1. Stable exam. No acute intra-abdominal findings.  2. Large right pleural effusion.  Electronically Signed  By: Jorje Guild M.D.  On: 10/05/2013 22:11    I once again recommended that we do a right VATS, drainage of the effusion and pleural catheter placement for definitive diagnosis and treatment  of the effusion. I explained that this was a relatively straight forward procedure but did require general anesthesia. The other option would be to do a thoracentesis. This will provide some immediate relief, but the fluid is likely to recur within a couple of weeks.  He does not want to have VATS despite my strong encouragement. He says he needs to get out of the hospital to take care of things at home.  Will order an US guided right thoracentesis and appropriate lab studies.

## 2013-10-06 NOTE — Procedures (Signed)
US guided diagnostic/therapeutic right thoracentesis performed yielding 2 liters yellow fluid. Only the above amount of fluid was removed at this time secondary to increased pt coughing. A portion of the fluid was sent to the lab for preordered studies.F/u CXR pending. No immediate complications.

## 2013-10-06 NOTE — Care Management Note (Unsigned)
    Page 1 of 1   10/06/2013     3:38:37 PM   CARE MANAGEMENT NOTE 10/06/2013  Patient:  Chris [name], Chris [name]   Account Number:  [id number]  Date Initiated:  10/06/2013  Documentation initiated by:  Chantilly Linskey  Subjective/Objective Assessment:   PT ADM ON 1/13 WITH RECURRENT PLEURAL EFFUSION.  PTA, PT INDEPENDENT, LIVES WITH SPOUSE.     Action/Plan:   WILL FOLLOW FOR DC NEEDS AS PT PROGRESSES.   Anticipated DC Date:  10/06/2013   Anticipated DC Plan:  [place]  CM consult      Choice offered to / List presented to:             Status of service:  In process, will continue to follow Medicare Important Message given?   (If response is "NO", the following Medicare IM given date fields will be blank) Date Medicare IM given:   Date Additional Medicare IM given:    Discharge Disposition:    Per UR Regulation:  Reviewed for med. necessity/level of care/duration of stay  If discussed at [place] of Stay Meetings, dates discussed:    Comments:

## 2013-10-06 NOTE — Progress Notes (Signed)
Returned to 2w10. SBP 97/45 HR 113-120. Patient was symptomatic in ultrasound with low BP. BP improving. Patient states "I'm starting to feel a little better." Patient instructed to notify RN of need to get oob. Bed alarm in place. Call bell and family near.Cindee Salt

## 2013-10-06 NOTE — Progress Notes (Signed)
This am patient has been refusing insulin coverage because he states that it makes him hungry. He states he doesn't want to eat because it makes his stomach hurt. Education provided. Patient's CBG was 213 from ultrasound with 3 unit coverage given. Patient now c/o being thirsty and having dry mouth. Request to check CBG; 243. Patient to take levimir that he refused this am. Patient requested Amitiza this am (home medication) but has been refusing all day. Patient possibly having periods of confusion. Patient is alert and oriented but is forgetful. Will continue to monitor. Cindee Salt

## 2013-10-06 NOTE — Progress Notes (Signed)
After CXR, started to transfer patient to 2W (report was called to [facility] on 2W). Patient stated feeling faint again. BP 86/42 HR 120 Sp02 100% 2L [place]. K Allred aware. He also spoke to [place], pt.'s nurse. To transfer her to 2W10. Patient transferred by Wyvonne Lenz RN and report to [place] updated.

## 2013-10-06 NOTE — Progress Notes (Signed)
[facility] PROGRESS NOTE  Chris [name] LGX:[id number] DOB: 1941/08/09 DOA: 10/04/2013 PCP: Gar Ponto, MD  Assessment/Plan: 1. Recurrence right-sided pleural effusions. Patient currently following Dr Roxan Hockey of the CT surgery. We discussed case. CT surgery recommending VATS procedure, however patient refusing this and expressed desire for thoracentesis. I explained to patient that pleural effusion would likely recur. Patient undergoing ultrasound-guided thoracentesis today. Postprocedure he was hypotensive with systolic blood pressures in the 80s. 2. Abdominal pain. Patient reporting having abdominal distention, bloating, with some epigastric pain. Lab work showed a white count within normal limits at 7800. CT scan of abdomen and pelvis showing no acute abdominal pathology. Looking back at her records, he has a history of chronic abdominal pain. Patient states being prescribed Amitiza in the past which has provided relief.  3. 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 Baseline creatinine near 2.0. A.m. lab work done on 10/04/2048 showed a creatinine of 2.05. 4. Insulin-dependent diabetes mellitus. Continue Detemir 40 units subcutaneous daily, Accu-Cheks Q. a.c. each bedtime 5. Hypotension likely related to thoracentesis. Systolic blood pressures improving to the 90s. Will closely monitor overnight.  Code Status: Full Code Disposition Plan: Patient hypotensive post procedure, plan to keep him overnight for close monitoring.   Consultants:  CT surgery, Dr. Roxan Hockey    HPI/Subjective: Patient is a 73 year old gentleman with a history of recurrent right pleural effusions undergoing multiple thoracentesis procedures, presented as a transfer from [facility] on 10/04/2013, patient presenting with reports of shortness of breath, imaging studies showing reaccumulation of large right pleural effusion. Dr. Roxan Hockey of CT surgery was consulted as there was discussion about the  possibility of VATS. This morning patient reporting abdominal bloating, having some pain over the epigastric region. Otherwise states his shortness of breath has not worsened any since admission.  Patient undergoing ultrasound-guided thoracentesis today. Postprocedure he was hypotensive with systolic blood pressures in the 80s now improved to the 90s. I recommended patient to stay for overnight monitoring given hypotension.  Objective: Filed Vitals:   10/06/13 1549  BP: 97/45  Pulse: 124  Temp: 98.1 F (36.7 C)  Resp:     Intake/Output Summary (Last 24 hours) at 10/06/13 1723 Last data filed at 10/06/13 1300  Gross per 24 hour  Intake      0 ml  Output      0 ml  Net      0 ml   Filed Weights   10/04/13 0009 10/04/13 0012 10/04/13 1744  Weight: 102.059 kg (225 lb) 99.791 kg (220 lb) 95.936 kg (211 lb 8 oz)    Exam:   General:  Patient does not appear in acute distress awake alert oriented  Cardiovascular: Regular rate rhythm normal S1-S2  Respiratory: Diminished breath sounds to right lung, positive crackles, normal respiratory effort. He does not appear to be an active respiratory distress.  Abdomen: Patient reporting pain to palpation over epigastric region, no peritoneal signs, no rebound tenderness or guarding.  Musculoskeletal: No edema  Data Reviewed: Basic Metabolic Panel:  Recent Labs Lab 10/04/13 0022  NA 137  K 4.9  CL 96  CO2 28  GLUCOSE 231*  BUN 52*  CREATININE 2.05*  CALCIUM 9.5   Liver Function Tests:  Recent Labs Lab 10/04/13 0022  AST 28  ALT 20  ALKPHOS 81  BILITOT 1.0  PROT 8.5*  ALBUMIN 3.9    Recent Labs Lab 10/04/13 0022  LIPASE 30   No results found for this basename: AMMONIA,  in the last 168 hours  CBC:  Recent Labs Lab 10/04/13 0022  WBC 7.8  NEUTROABS 5.3  HGB 12.3*  HCT 34.4*  MCV 98.3  PLT 222   Cardiac Enzymes: No results found for this basename: CKTOTAL, CKMB, CKMBINDEX, TROPONINI,  in the last 168  hours BNP (last 3 results)  Recent Labs  08/03/13 1347  PROBNP 1834.0*   CBG:  Recent Labs Lab 10/06/13 0413 10/06/13 0809 10/06/13 1128 10/06/13 1509 10/06/13 1652  GLUCAP 115* 147* 171* 213* 243*    No results found for this or any previous visit (from the past 240 hour(s)).   Studies: Ct Abdomen Pelvis Wo Contrast  10/05/2013   CLINICAL DATA:  Abdominal pain.  EXAM: CT ABDOMEN AND PELVIS WITHOUT CONTRAST  TECHNIQUE: Multidetector CT imaging of the abdomen and pelvis was performed following the standard protocol without intravenous contrast.  COMPARISON:  08/06/2013  FINDINGS: LOWER CHEST: Coronary artery atherosclerosis. Large, water density right pleural effusion.  ABDOMEN/PELVIS:  Liver: No focal abnormality.  Biliary: Cholecystectomy.  Pancreas: Diffuse atrophy, without ductal dilatation.  Spleen: Unremarkable.  Adrenals: Unremarkable.  Kidneys and ureters: Bilateral renal cortical thinning. No hydronephrosis or nephrolithiasis.  Bladder: Circumferential bladder wall thickening, likely from chronic outlet obstruction.  .  Reproductive: Enlarged prostate, measuring 6 cm in transverse span and deforming the bladder base.  Bowel: No bowel obstruction. Thickening of the posteromedial wall of the mid duodenum is stable dating back to 2009, likely redundant folds. No pericecal inflammation.  Retroperitoneum: No mass or adenopathy.  Peritoneum: No free fluid or gas.  Vascular: Diffuse arterial calcification.  OSSEOUS: Remote L1 superior endplate fracture. No acute osseous findings.  IMPRESSION: 1. Stable exam.  No acute intra-abdominal findings. 2. Large right pleural effusion.   Electronically Signed   By: Jorje Guild M.D.   On: 10/05/2013 22:11   Dg Chest 1 View  10/06/2013   CLINICAL DATA:  Status post right thoracentesis  EXAM: CHEST - 1 VIEW  COMPARISON:  10/04/2013  FINDINGS: There is no evidence of a pneumothorax status post thoracentesis. Decreased consolidative density within  the right lung base is appreciated consistent with drainage of the patient's pleural effusion. Residual ill-defined density projects within the lung base representing either atelectasis or possibly diffuse infiltrate. There is blunting of the right costophrenic angle tracking along the lateral right hemi thorax. Residual effusion with loculation is of diagnostic consideration. . Cardiac silhouette is enlarged. It status post median sternotomy coronary artery bypass grafting. Degenerative changes identified within the right lower shoulders.  IMPRESSION: 1. No evidence for pneumothorax status post thoracentesis 2. Atelectasis versus infiltrate within the right lung base. 3. Small residual effusion possibly a component of loculation along the right hemithorax.   Electronically Signed   By: Margaree Mackintosh M.D.   On: 10/06/2013 15:51   US Thoracentesis Asp Pleural Space W/img Guide  10/06/2013   CLINICAL DATA:  Coronary artery disease, prior CABG, dyspnea, recurrent large right pleural effusion. Request is made for diagnostic and therapeutic right thoracentesis.  EXAM: ULTRASOUND GUIDED DIAGNOSTIC AND THERAPEUTIC RIGHT THORACENTESIS  COMPARISON:  PRIOR THORACENTESIS ON 07/26/2013.  FINDINGS: A total of approximately 2 liters of yellow fluid was removed. A fluid sample wassent for laboratory analysis. Only the above amount of fluid was removed at this time secondary to patient coughing.  IMPRESSION: Successful ultrasound guided diagnostic and therapeutic right thoracentesis yielding 2 liters of pleural fluid.  Read by: Rowe Robert ,P.A.-C.  PROCEDURE: An ultrasound guided thoracentesis was thoroughly discussed with the patient and questions answered.  The benefits, risks, alternatives and complications were also discussed. The patient understands and wishes to proceed with the procedure. Written consent was obtained.  Ultrasound was performed to localize and mark an adequate pocket of fluid in the right chest. The area  was then prepped and draped in the normal sterile fashion. 1% Lidocaine was used for local anesthesia. Under ultrasound guidance a 19 gauge Yueh catheter was introduced. Thoracentesis was performed. The catheter was removed and a dressing applied.  Complications:  None immediate   Electronically Signed   By: Maryclare Bean M.D.   On: 10/06/2013 15:02    Scheduled Meds: . insulin aspart  0-9 Units Subcutaneous Q4H  . insulin detemir  40 Units Subcutaneous Daily  . lisinopril  20 mg Oral Q lunch  . lubiprostone  24 mcg Oral BID WC  . metoprolol  50 mg Oral BID  . simvastatin  20 mg Oral q1800  . sodium chloride  3 mL Intravenous Q12H   Continuous Infusions:   Principal Problem:   Pleural effusion, right Active Problems:   HTN (hypertension)   DM (diabetes mellitus)   Pleural effusion   Chronic kidney disease (CKD), stage IV (severe)    Time spent: 35 minutes    Kelvin Cellar  [facility] Pager [phone]. If 7PM-7AM, please contact night-coverage at [email or web address], password [email or web address] [facility] 10/06/2013, 5:23 PM  LOS: 2 days

## 2013-10-07 ENCOUNTER — Emergency Department (HOSPITAL_COMMUNITY): Payer: Medicare HMO

## 2013-10-07 ENCOUNTER — Inpatient Hospital Stay (HOSPITAL_COMMUNITY)
Admission: EM | Admit: 2013-10-07 | Discharge: 2013-10-11 | DRG: 684 | Disposition: A | Discharge status: Home Health | Payer: Medicare HMO | Attending: Internal Medicine | Admitting: Internal Medicine

## 2013-10-07 ENCOUNTER — Encounter (HOSPITAL_COMMUNITY): Payer: Self-pay | Admitting: Emergency Medicine

## 2013-10-07 DIAGNOSIS — N2581 Secondary hyperparathyroidism of renal origin: Secondary | ICD-10-CM | POA: Diagnosis present

## 2013-10-07 DIAGNOSIS — R109 Unspecified abdominal pain: Secondary | ICD-10-CM | POA: Diagnosis present

## 2013-10-07 DIAGNOSIS — Z794 Long term (current) use of insulin: Secondary | ICD-10-CM

## 2013-10-07 DIAGNOSIS — E1121 Type 2 diabetes mellitus with diabetic nephropathy: Secondary | ICD-10-CM

## 2013-10-07 DIAGNOSIS — K219 Gastro-esophageal reflux disease without esophagitis: Secondary | ICD-10-CM

## 2013-10-07 DIAGNOSIS — E1165 Type 2 diabetes mellitus with hyperglycemia: Secondary | ICD-10-CM | POA: Diagnosis present

## 2013-10-07 DIAGNOSIS — N179 Acute kidney failure, unspecified: Principal | ICD-10-CM | POA: Diagnosis present

## 2013-10-07 DIAGNOSIS — Z66 Do not resuscitate: Secondary | ICD-10-CM | POA: Diagnosis present

## 2013-10-07 DIAGNOSIS — I4891 Unspecified atrial fibrillation: Secondary | ICD-10-CM | POA: Diagnosis present

## 2013-10-07 DIAGNOSIS — Z7982 Long term (current) use of aspirin: Secondary | ICD-10-CM

## 2013-10-07 DIAGNOSIS — I509 Heart failure, unspecified: Secondary | ICD-10-CM | POA: Diagnosis present

## 2013-10-07 DIAGNOSIS — E119 Type 2 diabetes mellitus without complications: Secondary | ICD-10-CM

## 2013-10-07 DIAGNOSIS — I639 Cerebral infarction, unspecified: Secondary | ICD-10-CM

## 2013-10-07 DIAGNOSIS — F329 Major depressive disorder, single episode, unspecified: Secondary | ICD-10-CM | POA: Diagnosis present

## 2013-10-07 DIAGNOSIS — Z951 Presence of aortocoronary bypass graft: Secondary | ICD-10-CM

## 2013-10-07 DIAGNOSIS — N184 Chronic kidney disease, stage 4 (severe): Secondary | ICD-10-CM

## 2013-10-07 DIAGNOSIS — J90 Pleural effusion, not elsewhere classified: Secondary | ICD-10-CM

## 2013-10-07 DIAGNOSIS — G8929 Other chronic pain: Secondary | ICD-10-CM | POA: Diagnosis present

## 2013-10-07 DIAGNOSIS — Z8673 Personal history of transient ischemic attack (TIA), and cerebral infarction without residual deficits: Secondary | ICD-10-CM

## 2013-10-07 DIAGNOSIS — I129 Hypertensive chronic kidney disease with stage 1 through stage 4 chronic kidney disease, or unspecified chronic kidney disease: Secondary | ICD-10-CM | POA: Diagnosis present

## 2013-10-07 DIAGNOSIS — I2589 Other forms of chronic ischemic heart disease: Secondary | ICD-10-CM | POA: Diagnosis present

## 2013-10-07 DIAGNOSIS — E1129 Type 2 diabetes mellitus with other diabetic kidney complication: Secondary | ICD-10-CM

## 2013-10-07 DIAGNOSIS — E1169 Type 2 diabetes mellitus with other specified complication: Secondary | ICD-10-CM | POA: Diagnosis present

## 2013-10-07 DIAGNOSIS — N183 Chronic kidney disease, stage 3 unspecified: Secondary | ICD-10-CM | POA: Diagnosis present

## 2013-10-07 DIAGNOSIS — E785 Hyperlipidemia, unspecified: Secondary | ICD-10-CM | POA: Diagnosis present

## 2013-10-07 DIAGNOSIS — I2510 Atherosclerotic heart disease of native coronary artery without angina pectoris: Secondary | ICD-10-CM | POA: Diagnosis present

## 2013-10-07 DIAGNOSIS — K5900 Constipation, unspecified: Secondary | ICD-10-CM

## 2013-10-07 DIAGNOSIS — N058 Unspecified nephritic syndrome with other morphologic changes: Secondary | ICD-10-CM

## 2013-10-07 DIAGNOSIS — F3289 Other specified depressive episodes: Secondary | ICD-10-CM | POA: Diagnosis present

## 2013-10-07 DIAGNOSIS — I252 Old myocardial infarction: Secondary | ICD-10-CM

## 2013-10-07 DIAGNOSIS — J189 Pneumonia, unspecified organism: Secondary | ICD-10-CM

## 2013-10-07 DIAGNOSIS — R34 Anuria and oliguria: Secondary | ICD-10-CM

## 2013-10-07 DIAGNOSIS — I10 Essential (primary) hypertension: Secondary | ICD-10-CM | POA: Diagnosis present

## 2013-10-07 DIAGNOSIS — R339 Retention of urine, unspecified: Secondary | ICD-10-CM | POA: Diagnosis present

## 2013-10-07 LAB — URINALYSIS, ROUTINE W REFLEX MICROSCOPIC
GLUCOSE, UA: 100 mg/dL — AB
Ketones, ur: 15 mg/dL — AB
Leukocytes, UA: NEGATIVE
Nitrite: NEGATIVE
PROTEIN: NEGATIVE mg/dL
UROBILINOGEN UA: 0.2 mg/dL (ref 0.0–1.0)
pH: 5 (ref 5.0–8.0)

## 2013-10-07 LAB — CBC WITH DIFFERENTIAL/PLATELET
BASOS PCT: 0 % (ref 0–1)
Basophils Absolute: 0 10*3/uL (ref 0.0–0.1)
Eosinophils Absolute: 0 10*3/uL (ref 0.0–0.7)
Eosinophils Relative: 0 % (ref 0–5)
HCT: 35.3 % — ABNORMAL LOW (ref 39.0–52.0)
HEMOGLOBIN: 12.3 g/dL — AB (ref 13.0–17.0)
Lymphocytes Relative: 14 % (ref 12–46)
Lymphs Abs: 2.2 10*3/uL (ref 0.7–4.0)
MCH: 34.9 pg — AB (ref 26.0–34.0)
MCHC: 34.8 g/dL (ref 30.0–36.0)
MCV: 100.3 fL — ABNORMAL HIGH (ref 78.0–100.0)
MONO ABS: 1.4 10*3/uL — AB (ref 0.1–1.0)
Monocytes Relative: 9 % (ref 3–12)
Neutro Abs: 11.8 10*3/uL — ABNORMAL HIGH (ref 1.7–7.7)
Neutrophils Relative %: 77 % (ref 43–77)
Platelets: 250 10*3/uL (ref 150–400)
RBC: 3.52 MIL/uL — ABNORMAL LOW (ref 4.22–5.81)
RDW: 12.9 % (ref 11.5–15.5)
WBC: 15.3 10*3/uL — ABNORMAL HIGH (ref 4.0–10.5)

## 2013-10-07 LAB — BODY FLUID CELL COUNT WITH DIFFERENTIAL
EOS FL: NONE SEEN %
Lymphs, Fluid: 58 %
Monocyte-Macrophage-Serous Fluid: 41 % — ABNORMAL LOW (ref 50–90)
Neutrophil Count, Fluid: 1 % (ref 0–25)
WBC FLUID: 259 uL (ref 0–1000)

## 2013-10-07 LAB — COMPREHENSIVE METABOLIC PANEL
ALBUMIN: 3.7 g/dL (ref 3.5–5.2)
ALT: 19 U/L (ref 0–53)
AST: 32 U/L (ref 0–37)
Alkaline Phosphatase: 78 U/L (ref 39–117)
BUN: 73 mg/dL — ABNORMAL HIGH (ref 6–23)
CALCIUM: 9.2 mg/dL (ref 8.4–10.5)
CO2: 24 mEq/L (ref 19–32)
Chloride: 94 mEq/L — ABNORMAL LOW (ref 96–112)
Creatinine, Ser: 4.97 mg/dL — ABNORMAL HIGH (ref 0.50–1.35)
GFR calc Af Amer: 12 mL/min — ABNORMAL LOW (ref >90)
GFR calc non Af Amer: 10 mL/min — ABNORMAL LOW (ref >90)
Glucose, Bld: 310 mg/dL — ABNORMAL HIGH (ref 70–99)
Potassium: 4.4 mEq/L (ref 3.7–5.3)
Sodium: 137 mEq/L (ref 137–147)
TOTAL PROTEIN: 7.9 g/dL (ref 6.0–8.3)
Total Bilirubin: 1 mg/dL (ref 0.3–1.2)

## 2013-10-07 LAB — URINE MICROSCOPIC-ADD ON

## 2013-10-07 LAB — GLUCOSE, CAPILLARY
GLUCOSE-CAPILLARY: 262 mg/dL — AB (ref 70–99)
GLUCOSE-CAPILLARY: 229 mg/dL — AB (ref 70–99)

## 2013-10-07 MED ORDER — SIMVASTATIN 20 MG PO TABS
20.0000 mg | ORAL_TABLET | Freq: Every day | ORAL | Status: DC
  Administered 2013-10-08 – 2013-10-10 (×3): 20 mg via ORAL
  Filled 2013-10-07 (×3): qty 1

## 2013-10-07 MED ORDER — INSULIN ASPART 100 UNIT/ML ~~LOC~~ SOLN
0.0000 [IU] | Freq: Three times a day (TID) | SUBCUTANEOUS | Status: DC
  Administered 2013-10-08: 1 [IU] via SUBCUTANEOUS
  Administered 2013-10-08: 2 [IU] via SUBCUTANEOUS
  Administered 2013-10-08 – 2013-10-09 (×2): 5 [IU] via SUBCUTANEOUS
  Administered 2013-10-09: 1 [IU] via SUBCUTANEOUS
  Administered 2013-10-10 (×2): 5 [IU] via SUBCUTANEOUS
  Administered 2013-10-11 (×2): 2 [IU] via SUBCUTANEOUS

## 2013-10-07 MED ORDER — HYDROCODONE-ACETAMINOPHEN 5-325 MG PO TABS: 1.0000 | ORAL_TABLET | ORAL | Status: DC | PRN

## 2013-10-07 MED ORDER — FUROSEMIDE 40 MG PO TABS: 40.0000 mg | ORAL_TABLET | Freq: Every day | ORAL | Status: DC

## 2013-10-07 MED ORDER — SODIUM CHLORIDE 0.9 % IV SOLN
INTRAVENOUS | Status: DC
  Administered 2013-10-08 – 2013-10-10 (×6): via INTRAVENOUS

## 2013-10-07 MED ORDER — LORAZEPAM 0.5 MG PO TABS: 0.5000 mg | ORAL_TABLET | Freq: Two times a day (BID) | ORAL | Status: DC | PRN

## 2013-10-07 MED ORDER — HEPARIN SODIUM (PORCINE) 5000 UNIT/ML IJ SOLN
5000.0000 [IU] | Freq: Three times a day (TID) | INTRAMUSCULAR | Status: DC
  Administered 2013-10-08 – 2013-10-11 (×10): 5000 [IU] via SUBCUTANEOUS
  Filled 2013-10-07 (×10): qty 1

## 2013-10-07 MED ORDER — ASPIRIN EC 81 MG PO TBEC
81.0000 mg | DELAYED_RELEASE_TABLET | Freq: Every day | ORAL | Status: DC
  Administered 2013-10-08 – 2013-10-11 (×4): 81 mg via ORAL
  Filled 2013-10-07 (×4): qty 1

## 2013-10-07 MED ORDER — INSULIN DETEMIR 100 UNIT/ML ~~LOC~~ SOLN
40.0000 [IU] | Freq: Every day | SUBCUTANEOUS | Status: DC
  Administered 2013-10-08 – 2013-10-09 (×2): 40 [IU] via SUBCUTANEOUS
  Filled 2013-10-07 (×4): qty 0.4

## 2013-10-07 MED ORDER — PREDNISOLONE ACETATE 1 % OP SUSP
1.0000 [drp] | Freq: Four times a day (QID) | OPHTHALMIC | Status: DC | PRN
  Filled 2013-10-07: qty 1

## 2013-10-07 MED ORDER — SODIUM CHLORIDE 0.9 % IV SOLN
INTRAVENOUS | Status: DC
  Administered 2013-10-07: 1000 mL via INTRAVENOUS

## 2013-10-07 MED ORDER — SODIUM CHLORIDE 0.9 % IV SOLN: INTRAVENOUS | Status: DC

## 2013-10-07 NOTE — ED Notes (Signed)
Pt just discharged from [facility] today. Pt has not urinated since yesterday but he did not tell anyone. Pt her because he is unable to urinate.

## 2013-10-07 NOTE — Discharge Summary (Signed)
Physician Discharge Summary  Chris [name] HYI:[id number] DOB: 06-Nov-1940 DOA: 10/04/2013  PCP: Gar Ponto, MD  Admit date: 10/04/2013 Discharge date: 10/07/2013  Time spent: 35 minutes  Recommendations for Outpatient Follow-up:  1. Patient followup with Dr. Roxan Hockey of CT surgery for possible VATS procedure  Discharge Diagnoses:  Principal Problem:   Pleural effusion, right Active Problems:   HTN (hypertension)   DM (diabetes mellitus)   Pleural effusion   Chronic kidney disease (CKD), stage IV (severe)   Discharge Condition: Stable/improved  Diet recommendation: Heart healthy diet  Filed Weights   10/04/13 0009 10/04/13 0012 10/04/13 1744  Weight: 102.059 kg (225 lb) 99.791 kg (220 lb) 95.936 kg (211 lb 8 oz)    History of present illness:  73 year old male who has a past medical history of Acute cerebrovascular accident; Diabetes mellitus; Macrocytosis; Ischemic cardiomyopathy; Atrial fibrillation; Hyperlipidemia; Hypertension; Complication of anesthesia; PONV (postoperative nausea and vomiting); Acute renal insufficiency; Myocardial infarct; and CHF (congestive heart failure).  Today presented to the hospital with worsening shortness of breath, patient has history of recurrent right-sided thoracic pleural effusion and has required multiple thoracentesis for both diagnostic and therapeutic purposes. Patient was followed by Dr. Roxan Hockey of CT surgery who performed thoracentesis and the results of the fluid analysis showed transudate. No malignant cells seen in the effusion. There was a discussion to perform VATS procedure, with a drain but patient did not followup with CT surgeon.  He came today with worsening shortness of breath, and also bloating in the abdomen with excessive belching. He denies chest pain, no nausea vomiting or diarrhea. Patient does not appear to be in any respiratory distress at this time   Hospital Course:  Patient is a 73 year old gentleman with a  past medical history recurrent right pleural effusions undergoing multiple thoracentesis procedures in the past, he presented as a transfer from [facility] on 10/04/2013 where he originally presented with complaints of shortness of breath. Imaging studies showing the reaccumulation of a large right pleural effusion. Dr. Roxan Hockey of cardiovascular surgery was consulted as there was discussion of the possibility of patient undergoing VATS procedure. Dr. Roxan Hockey was consulted. He recommended that patient undergo VATS procedure during this hospitalization with drainage of effusion and pleural catheter placement for definitive diagnosis and treatment of recurrent effusions. Patient was explained that simply doing a thoracentesis will provide immediate relief however pleural effusion would likely recur. Patient expressed his desire to not undergo VATS procedure and expressed desire for ultrasound-guided thoracentesis and discharge thereafter. Patient undergoing ultrasound-guided thoracentesis on 10/06/2013. Postprocedure he was found to be hypotensive for which he was kept overnight for observation. On the following morning blood pressures slightly low, with systolic blood pressure 786. I recommended that he hold off on his antihypertensive agents, to followup at his primary care physician's office in 1-2 days for a blood pressure check and await further instructions from PCP. Other issues addressed during this hospitalization included abdominal discomfort. This was worked up with a CT scan which was unremarkable. Looking back at his records it appears he has a history of chronic abdominal pain. I encouraged and followup with his gastroenterologist. Patient otherwise was tolerating by mouth intake, hemodynamically stable, discharged on 10/07/2013.  Procedures:  Ultrasound-guided thoracentesis performed on 10/07/2013  Consultations:  Cardiothoracic surgery: Dr. Roxan Hockey  Discharge Exam: Filed  Vitals:   10/07/13 1138  BP: 105/36  Pulse:   Temp:   Resp:     General: Patient states feeling much better this morning, asking to  go home Cardiovascular: Regular rate rhythm normal S1-S2 Respiratory: Improved lung sounds to his right base, normal respiratory effort, has a few bibasal crackles Abdomen: Soft nontender nondistended  Discharge Instructions  Discharge Orders   Future Appointments Provider Department Dept Phone   10/11/2013 10:00 AM Melrose Nakayama, MD [facility] [phone]   Future Orders Complete By Expires   Call MD for:  difficulty breathing, headache or visual disturbances  As directed    Call MD for:  extreme fatigue  As directed    Call MD for:  persistant dizziness or light-headedness  As directed    Call MD for:  severe uncontrolled pain  As directed    Call MD for:  temperature >100.4  As directed    Diet - low sodium heart healthy  As directed    Discharge instructions  As directed    Comments:     Your blood pressures were low in the hospital and I would recommend that you not take your blood pressure medications for now, follow up at your family physicians office in 1-2 days for a blood pressure check, and touch base with your family doctor about restarting your blood pressure medication.   Increase activity slowly  As directed        Medication List    STOP taking these medications       lisinopril 20 MG tablet  Commonly known as:  PRINIVIL,ZESTRIL     metoprolol 50 MG tablet  Commonly known as:  LOPRESSOR      TAKE these medications       aspirin EC 81 MG tablet  Take 81 mg by mouth daily.     furosemide 40 MG tablet  Commonly known as:  LASIX  Take 1 tablet (40 mg total) by mouth daily.     Insulin Detemir 100 UNIT/ML Pen  Commonly known as:  LEVEMIR FLEXPEN  Inject 40 Units into the skin daily.     LORazepam 0.5 MG tablet  Commonly known as:  ATIVAN  Take 0.5 mg by mouth 2 (two)  times daily as needed for anxiety.     lovastatin 40 MG tablet  Commonly known as:  MEVACOR  Take 40 mg by mouth at bedtime.     lubiprostone 24 MCG capsule  Commonly known as:  AMITIZA  Take 24 mcg by mouth every other day.     Magnesium 250 MG Tabs  Take 250 mg by mouth daily. OTC     prednisoLONE acetate 1 % ophthalmic suspension  Commonly known as:  PRED FORTE  Place 1 drop into both eyes 4 (four) times daily as needed (Dry Eyes).     Vitamin D-3 5000 UNITS Tabs  Take 5,000 Units by mouth daily.       Allergies  Allergen Reactions  . Ciprofloxacin Other (See Comments)    Sores all on legs.   . Finasteride     Affected  Patients testicles  . Metoclopramide     Jerking all over  . Xanax [Alprazolam] Other (See Comments)    Not in right state of mind.        Follow-up Information   Follow up with Gar Ponto, MD In 1 week.   Specialty:  Family Medicine   Contact information:   [address] [place] [address] [phone]       Follow up with HENDRICKSON,STEVEN C, MD In 10 days.   Specialty:  Cardiothoracic Surgery   Contact  information:   [address] [place] [address] [phone]       Follow up with Gar Ponto, MD In 1 day. (Follow up in 1-2 days for a nurse visit to check blood pressures. )    Specialty:  Family Medicine   Contact information:   [address] [place] [address] [phone]        The results of significant diagnostics from this hospitalization (including imaging, microbiology, ancillary and laboratory) are listed below for reference.    Significant Diagnostic Studies: Ct Abdomen Pelvis Wo Contrast  10/05/2013   CLINICAL DATA:  Abdominal pain.  EXAM: CT ABDOMEN AND PELVIS WITHOUT CONTRAST  TECHNIQUE: Multidetector CT imaging of the abdomen and pelvis was performed following the standard protocol without intravenous contrast.  COMPARISON:  08/06/2013  FINDINGS: LOWER CHEST: Coronary artery  atherosclerosis. Large, water density right pleural effusion.  ABDOMEN/PELVIS:  Liver: No focal abnormality.  Biliary: Cholecystectomy.  Pancreas: Diffuse atrophy, without ductal dilatation.  Spleen: Unremarkable.  Adrenals: Unremarkable.  Kidneys and ureters: Bilateral renal cortical thinning. No hydronephrosis or nephrolithiasis.  Bladder: Circumferential bladder wall thickening, likely from chronic outlet obstruction.  .  Reproductive: Enlarged prostate, measuring 6 cm in transverse span and deforming the bladder base.  Bowel: No bowel obstruction. Thickening of the posteromedial wall of the mid duodenum is stable dating back to 2009, likely redundant folds. No pericecal inflammation.  Retroperitoneum: No mass or adenopathy.  Peritoneum: No free fluid or gas.  Vascular: Diffuse arterial calcification.  OSSEOUS: Remote L1 superior endplate fracture. No acute osseous findings.  IMPRESSION: 1. Stable exam.  No acute intra-abdominal findings. 2. Large right pleural effusion.   Electronically Signed   By: Jorje Guild M.D.   On: 10/05/2013 22:11   Dg Chest 1 View  10/06/2013   CLINICAL DATA:  Status post right thoracentesis  EXAM: CHEST - 1 VIEW  COMPARISON:  10/04/2013  FINDINGS: There is no evidence of a pneumothorax status post thoracentesis. Decreased consolidative density within the right lung base is appreciated consistent with drainage of the patient's pleural effusion. Residual ill-defined density projects within the lung base representing either atelectasis or possibly diffuse infiltrate. There is blunting of the right costophrenic angle tracking along the lateral right hemi thorax. Residual effusion with loculation is of diagnostic consideration. . Cardiac silhouette is enlarged. It status post median sternotomy coronary artery bypass grafting. Degenerative changes identified within the right lower shoulders.  IMPRESSION: 1. No evidence for pneumothorax status post thoracentesis 2. Atelectasis versus  infiltrate within the right lung base. 3. Small residual effusion possibly a component of loculation along the right hemithorax.   Electronically Signed   By: Margaree Mackintosh M.D.   On: 10/06/2013 15:51   Dg Chest 2 View  10/04/2013   CLINICAL DATA:  Shortness of breath. Prior CABG. Current history of ischemic cardiomyopathy, atrial fibrillation, diabetes, and hypertension.  EXAM: CHEST  2 VIEW  COMPARISON:  08/25/2013, 08/15/2013.  CT chest 08/06/2013.  FINDINGS: Recurrent large right pleural effusion and associated dense passive atelectasis in the right middle lobe and right lower lobe. No left pleural effusion. Left lung clear. Pulmonary vascularity normal. No pneumothorax.  Prior sternotomy for CABG. Cardiac silhouette moderately enlarged but stable. Thoracic aorta atherosclerotic, unchanged. Degenerative changes involving the thoracic spine.  IMPRESSION: 1. Recurrent large right pleural effusion with associated dense passive atelectasis in the right lower lobe and right middle lobe. 2. Stable moderate cardiomegaly without pulmonary edema.   Electronically Signed  By: Evangeline Dakin M.D.   On: 10/04/2013 01:12   US Thoracentesis Asp Pleural Space W/img Guide  10/06/2013   CLINICAL DATA:  Coronary artery disease, prior CABG, dyspnea, recurrent large right pleural effusion. Request is made for diagnostic and therapeutic right thoracentesis.  EXAM: ULTRASOUND GUIDED DIAGNOSTIC AND THERAPEUTIC RIGHT THORACENTESIS  COMPARISON:  PRIOR THORACENTESIS ON 07/26/2013.  FINDINGS: A total of approximately 2 liters of yellow fluid was removed. A fluid sample wassent for laboratory analysis. Only the above amount of fluid was removed at this time secondary to patient coughing.  IMPRESSION: Successful ultrasound guided diagnostic and therapeutic right thoracentesis yielding 2 liters of pleural fluid.  Read by: Rowe Robert ,P.A.-C.  PROCEDURE: An ultrasound guided thoracentesis was thoroughly discussed with the patient  and questions answered. The benefits, risks, alternatives and complications were also discussed. The patient understands and wishes to proceed with the procedure. Written consent was obtained.  Ultrasound was performed to localize and mark an adequate pocket of fluid in the right chest. The area was then prepped and draped in the normal sterile fashion. 1% Lidocaine was used for local anesthesia. Under ultrasound guidance a 19 gauge Yueh catheter was introduced. Thoracentesis was performed. The catheter was removed and a dressing applied.  Complications:  None immediate   Electronically Signed   By: Maryclare Bean M.D.   On: 10/06/2013 15:02    Microbiology: No results found for this or any previous visit (from the past 240 hour(s)).   Labs: Basic Metabolic Panel:  Recent Labs Lab 10/04/13 0022  NA 137  K 4.9  CL 96  CO2 28  GLUCOSE 231*  BUN 52*  CREATININE 2.05*  CALCIUM 9.5   Liver Function Tests:  Recent Labs Lab 10/04/13 0022  AST 28  ALT 20  ALKPHOS 81  BILITOT 1.0  PROT 8.5*  ALBUMIN 3.9    Recent Labs Lab 10/04/13 0022  LIPASE 30   No results found for this basename: AMMONIA,  in the last 168 hours CBC:  Recent Labs Lab 10/04/13 0022  WBC 7.8  NEUTROABS 5.3  HGB 12.3*  HCT 34.4*  MCV 98.3  PLT 222   Cardiac Enzymes: No results found for this basename: CKTOTAL, CKMB, CKMBINDEX, TROPONINI,  in the last 168 hours BNP: BNP (last 3 results)  Recent Labs  08/03/13 1347  PROBNP 1834.0*   CBG:  Recent Labs Lab 10/06/13 1854 10/06/13 1955 10/06/13 2345 10/07/13 0340 10/07/13 0753  GLUCAP 231* 257* 289* 262* 229*       Signed:  Lyndon Chenoweth  [facility] 10/07/2013, 1:08 PM

## 2013-10-07 NOTE — ED Provider Notes (Signed)
CSN: [id number]     Arrival date & time 10/07/13  1832 History  This chart was scribed for Ezequiel Essex, MD by Marlowe Kays, ED Scribe. This patient was seen in room [facility] and the patient's care was started at 8:32 PM.  Chief Complaint  Patient presents with  . Urinary Retention   The history is provided by the patient. No language interpreter was used.   HPI Comments:  Chris [name] is a 73 y.o. male who presents to the Emergency Department complaining of urinary retention since last night. Pt states he was discharged from [facility] yesterday. He states he experienced vomiting and some chest pain since yesterday. He reports having bowel movements yesterday. He states he does not feel like he needs to urinate. Pt reports having similar symptoms in the past and having a catheter inserted. He denies leg pain, back pain or any new testicular pain since discontinuing Finasteride.   Past Medical History  Diagnosis Date  . 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 Diabetes mellitus   . Macrocytosis     normal B12 & folate levels  . Ischemic cardiomyopathy     EF of 45-50% - posterior & inferior walls are severly hypokinetic  . Atrial fibrillation     not candidate for chronic anticoagulation with Coumadin given chrronic epistaxis as well as hemorrhagic conversion of his acute cva   . Hyperlipidemia   . Hypertension   . Complication of anesthesia   . PONV (postoperative nausea and vomiting)   . Acute renal insufficiency     resolved  . Myocardial infarct   . CHF (congestive heart failure)   . Shortness of breath   . Pleural effusion 10/05/2013   Past Surgical History  Procedure Laterality Date  . Coronary artery bypass graft    . Tonsillectomy    . Esophagogastroduodenoscopy (egd) with esophageal dilation N/A 10/27/2012    Dr. Gala Romney: distal esophageal diverticulum, non-complete Schatzki's ring  s/p dilation, distal esophageal nodule with benign path, negative Barrett's  . Cholecystectomy  2008  . Colonoscopy N/A 12/23/2012    Procedure: COLONOSCOPY;  Surgeon: Daneil Dolin, MD;  Location: [facility];  Service: Endoscopy;  Laterality: N/A;  9:45  . Cardiac surgery     Family History  Problem Relation Age of Onset  . Colon cancer Neg Hx   . Colon polyps Sister   . Colon polyps Sister    History  Substance Use Topics  . Smoking status: Never Smoker   . Smokeless tobacco: Never Used  . Alcohol Use: No    Review of Systems A complete 10 system review of systems was obtained and all systems are negative except as noted in the HPI and PMH.   Allergies  Ciprofloxacin; Finasteride; Metoclopramide; and Xanax  Home Medications   No current outpatient prescriptions on file. Triage Vitals: BP 91/49  Pulse 99  Temp(Src) 97.6 F (36.4 C) (Oral)  Resp 18  Ht 6\' 3"  (1.905 m)  Wt 203 lb (92.08 kg)  BMI 25.37 kg/m2  SpO2 99% Physical Exam  Nursing note and vitals reviewed. Constitutional: He is oriented to person, place, and time. He appears well-developed and well-nourished.  HENT:  Head: Normocephalic and atraumatic.  Eyes: EOM are normal.  Neck: Normal range of motion.  Cardiovascular: Normal rate.   Difficult to palpate DP and PT pulses. Chronic venous stasis changes to lower extremities.   Pulmonary/Chest: Effort  normal.  Decreased breath sounds at right base. Left lung sounds clear.  Abdominal: Soft. There is no tenderness.  Musculoskeletal: Normal range of motion.  Neurological: He is alert and oriented to person, place, and time.  Skin: Skin is warm and dry.  Psychiatric: He has a normal mood and affect. His behavior is normal.  5/5 strength in bilateral lower extremities. Ankle plantar and dorsiflexion intact. Great toe extension intact bilaterally. +2 DP and PT pulses. +2 patellar reflexes bilaterally. Normal gait.    ED Course  Procedures (including  critical care time) DIAGNOSTIC STUDIES: Oxygen Saturation is 99% on RA, normal by my interpretation.   COORDINATION OF CARE: 11:58 PM- Will insert catheter and obtain blood for lab work. Pt verbalizes understanding and agrees to plan.  Medications  0.9 %  sodium chloride infusion (not administered)  insulin detemir (LEVEMIR) injection 40 Units (not administered)  prednisoLONE acetate (PRED FORTE) 1 % ophthalmic suspension 1 drop (not administered)  LORazepam (ATIVAN) tablet 0.5 mg (not administered)  aspirin EC tablet 81 mg (not administered)  simvastatin (ZOCOR) tablet 20 mg (not administered)  heparin injection 5,000 Units (not administered)  HYDROcodone-acetaminophen (NORCO/VICODIN) 5-325 MG per tablet 1-2 tablet (not administered)  insulin aspart (novoLOG) injection 0-9 Units (not administered)    Labs Review Labs Reviewed  COMPREHENSIVE METABOLIC PANEL - Abnormal; Notable for the following:    Chloride 94 (*)    Glucose, Bld 310 (*)    BUN 73 (*)    Creatinine, Ser 4.97 (*)    GFR calc non Af Amer 10 (*)    GFR calc Af Amer 12 (*)    All other components within normal limits  CBC WITH DIFFERENTIAL - Abnormal; Notable for the following:    WBC 15.3 (*)    RBC 3.52 (*)    Hemoglobin 12.3 (*)    HCT 35.3 (*)    MCV 100.3 (*)    MCH 34.9 (*)    Neutro Abs 11.8 (*)    Monocytes Absolute 1.4 (*)    All other components within normal limits  URINALYSIS, ROUTINE W REFLEX MICROSCOPIC - Abnormal; Notable for the following:    APPearance CLOUDY (*)    Specific Gravity, Urine >1.030 (*)    Glucose, UA 100 (*)    Hgb urine dipstick SMALL (*)    Bilirubin Urine MODERATE (*)    Ketones, ur 15 (*)    All other components within normal limits  URINE MICROSCOPIC-ADD ON - Abnormal; Notable for the following:    Bacteria, UA MANY (*)    Casts HYALINE CASTS (*)    All other components within normal limits  CBC  CREATININE, SERUM  CBC  COMPREHENSIVE METABOLIC PANEL   Imaging  Review Dg Chest 1 View  10/06/2013   CLINICAL DATA:  Status post right thoracentesis  EXAM: CHEST - 1 VIEW  COMPARISON:  10/04/2013  FINDINGS: There is no evidence of a pneumothorax status post thoracentesis. Decreased consolidative density within the right lung base is appreciated consistent with drainage of the patient's pleural effusion. Residual ill-defined density projects within the lung base representing either atelectasis or possibly diffuse infiltrate. There is blunting of the right costophrenic angle tracking along the lateral right hemi thorax. Residual effusion with loculation is of diagnostic consideration. . Cardiac silhouette is enlarged. It status post median sternotomy coronary artery bypass grafting. Degenerative changes identified within the right lower shoulders.  IMPRESSION: 1. No evidence for pneumothorax status post thoracentesis 2. Atelectasis versus infiltrate within the  right lung base. 3. Small residual effusion possibly a component of loculation along the right hemithorax.   Electronically Signed   By: Margaree Mackintosh M.D.   On: 10/06/2013 15:51   Dg Chest 2 View  10/07/2013   CLINICAL DATA:  Thoracentesis yesterday.  Complaining of weakness.  EXAM: CHEST  2 VIEW  COMPARISON:  Chest x-ray 10/06/2013.  FINDINGS: Persistent moderate right-sided pleural effusion, likely partially loculated. The opacities throughout the base of the right lung favored to predominantly reflect some passive atelectasis, although some underlying airspace consolidation is not excluded. Left lung is clear. No left pleural effusion. No pneumothorax. Pulmonary vasculature is normal. Heart size is mildly enlarged. Upper mediastinal contours are within normal limits. Atherosclerosis in the thoracic aorta. Status post median sternotomy.  IMPRESSION: 1. Allowing for slight differences in patient positioning, the radiographic appearance the chest is unchanged, as above. Specifically, no pneumothorax identified.    Electronically Signed   By: Vinnie Langton M.D.   On: 10/07/2013 21:28   US Thoracentesis Asp Pleural Space W/img Guide  10/06/2013   CLINICAL DATA:  Coronary artery disease, prior CABG, dyspnea, recurrent large right pleural effusion. Request is made for diagnostic and therapeutic right thoracentesis.  EXAM: ULTRASOUND GUIDED DIAGNOSTIC AND THERAPEUTIC RIGHT THORACENTESIS  COMPARISON:  PRIOR THORACENTESIS ON 07/26/2013.  FINDINGS: A total of approximately 2 liters of yellow fluid was removed. A fluid sample wassent for laboratory analysis. Only the above amount of fluid was removed at this time secondary to patient coughing.  IMPRESSION: Successful ultrasound guided diagnostic and therapeutic right thoracentesis yielding 2 liters of pleural fluid.  Read by: Rowe Robert ,P.A.-C.  PROCEDURE: An ultrasound guided thoracentesis was thoroughly discussed with the patient and questions answered. The benefits, risks, alternatives and complications were also discussed. The patient understands and wishes to proceed with the procedure. Written consent was obtained.  Ultrasound was performed to localize and mark an adequate pocket of fluid in the right chest. The area was then prepped and draped in the normal sterile fashion. 1% Lidocaine was used for local anesthesia. Under ultrasound guidance a 19 gauge Yueh catheter was introduced. Thoracentesis was performed. The catheter was removed and a dressing applied.  Complications:  None immediate   Electronically Signed   By: Maryclare Bean M.D.   On: 10/06/2013 15:02    EKG Interpretation    Date/Time:    Ventricular Rate:    PR Interval:    QRS Duration:   QT Interval:    QTC Calculation:   R Axis:     Text Interpretation:              MDM   1. Acute renal failure   2. Oliguria   3. DM (diabetes mellitus)   4. Pleural effusion, right    Patient states unable to urinate since 8 PM last night. He did not have the urge. Discharge from [facility] today after thoracentesis for recurrent pleural effusion. He denies any shortness of breath or chest pain.   Foley catheter placed with minimal urine output.  Creatinine Found to be 4.9 from baseline 2.0. Patient has had some episodes of hypotension since his thoracentesis 2 days ago. He is started on IV fluids for renal failure as well as hypertension. CT scan of the abdomen reviewed and benign.  Minimal urine output a Foley catheter. Patient will be admitted for treatment of his renal failure.  I personally performed the services described in this documentation, which was scribed in  my presence. The recorded information has been reviewed and is accurate.    Ezequiel Essex, MD 10/08/13 0000

## 2013-10-07 NOTE — Progress Notes (Signed)
NURSING PROGRESS NOTE  Chris [name] [id number] Discharge Data: 10/07/2013 12:00 PM Attending Provider: Kelvin Cellar, MD GEX:[id number], Coralyn Mark, MD     Demetria Pore to be D/C'd Home per MD order.  Discussed with the patient the After Visit Summary and all questions fully answered. All IV's discontinued with no bleeding noted. All belongings returned to patient for patient to take home. Explained in great detail to follow up with primary care physician to blood pressure medication. He was also instructed to only take lasix once daily. He verbalized having an understanding.   Last Vital Signs:  Blood pressure 105/36, pulse 94, temperature 98.1 F (36.7 C), temperature source Oral, resp. rate 20, height 6\' 2"  (1.88 m), weight 95.936 kg (211 lb 8 oz), SpO2 98.00%.  Discharge Medication List   Medication List    STOP taking these medications       lisinopril 20 MG tablet  Commonly known as:  PRINIVIL,ZESTRIL     metoprolol 50 MG tablet  Commonly known as:  LOPRESSOR      TAKE these medications       aspirin EC 81 MG tablet  Take 81 mg by mouth daily.     furosemide 40 MG tablet  Commonly known as:  LASIX  Take 1 tablet (40 mg total) by mouth daily.     Insulin Detemir 100 UNIT/ML Pen  Commonly known as:  LEVEMIR FLEXPEN  Inject 40 Units into the skin daily.     LORazepam 0.5 MG tablet  Commonly known as:  ATIVAN  Take 0.5 mg by mouth 2 (two) times daily as needed for anxiety.     lovastatin 40 MG tablet  Commonly known as:  MEVACOR  Take 40 mg by mouth at bedtime.     lubiprostone 24 MCG capsule  Commonly known as:  AMITIZA  Take 24 mcg by mouth every other day.     Magnesium 250 MG Tabs  Take 250 mg by mouth daily. OTC     prednisoLONE acetate 1 % ophthalmic suspension  Commonly known as:  PRED FORTE  Place 1 drop into both eyes 4 (four) times daily as needed (Dry Eyes).     Vitamin D-3 5000 UNITS Tabs  Take 5,000 Units by mouth daily.

## 2013-10-07 NOTE — ED Notes (Signed)
Pulses present in both feet. EDP notified.

## 2013-10-07 NOTE — H&P (Signed)
PCP:   DANIEL, TERRY, MD   Chief Complaint:  Inability to pass urine  HPI: 72-year-old male with history of persistent right pleural effusion, C. KD, diabetes mellitus, hypertension who was just discharged today from  hospital, came back to the ED with inability to pass urine for one day. Patient was seen by CT surgery and recommended to undergo VATS procedure, but patient refused to undergo VATS and instead underwent ultrasound-guided thoracentesis on 1/15. Post procedure patient was found to be hypotensive but as patient's blood pressure improved he was discharged home. As per patient he has not been able to urinate since yesterday, in the ED today patient's labs reveal acute kidney injury with creatinine of 4.97 which is up from his baseline 2.05 as of 1/12. Patient had one episode of vomiting yesterday,  No diarrhea. He has intermittent chest pain at the site of pleural effusion, denies fever no dysuria urgency or frequency of urination. Foley catheter was placed with very minimal urinary output. Allergies:   Allergies  Allergen Reactions  . Ciprofloxacin Other (See Comments)    Sores all on legs.   . Finasteride     Affected  Patients testicles  . Metoclopramide     Jerking all over  . Xanax [Alprazolam] Other (See Comments)    Not in right state of mind.       Past Medical History  Diagnosis Date  . 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 Diabetes mellitus   . Macrocytosis     normal B12 & folate levels  . Ischemic cardiomyopathy     EF of 45-50% - posterior & inferior walls are severly hypokinetic  . Atrial fibrillation     not candidate for chronic anticoagulation with Coumadin given chrronic epistaxis as well as hemorrhagic conversion of his acute cva   . Hyperlipidemia   . Hypertension   . Complication of anesthesia   . PONV (postoperative nausea and vomiting)   . Acute renal  insufficiency     resolved  . Myocardial infarct   . CHF (congestive heart failure)   . Shortness of breath   . Pleural effusion 10/05/2013    Past Surgical History  Procedure Laterality Date  . Coronary artery bypass graft    . Tonsillectomy    . Esophagogastroduodenoscopy (egd) with esophageal dilation N/A 10/27/2012    Dr. Rourk: distal esophageal diverticulum, non-complete Schatzki's ring s/p dilation, distal esophageal nodule with benign path, negative Barrett's  . Cholecystectomy  2008  . Colonoscopy N/A 12/23/2012    Procedure: COLONOSCOPY;  Surgeon: Robert M Rourk, MD;  Location: [facility];  Service: Endoscopy;  Laterality: N/A;  9:45  . Cardiac surgery      Prior to Admission medications   Medication Sig Start Date End Date Taking? Authorizing Provider  aspirin EC 81 MG tablet Take 81 mg by mouth daily.    Historical Provider, MD  Cholecalciferol (VITAMIN D-3) 5000 UNITS TABS Take 5,000 Units by mouth daily.     Historical Provider, MD  furosemide (LASIX) 40 MG tablet Take 1 tablet (40 mg total) by mouth daily. 10/07/13   Ezequiel Zamora, MD  Insulin Detemir (LEVEMIR FLEXPEN) 100 UNIT/ML SOPN Inject 40 Units into the skin daily. 08/07/13   Abraham Feliz Ortiz, MD  LORazepam (ATIVAN) 0.5 MG tablet Take 0.5 mg by mouth 2 (two) times daily as needed for anxiety.    Historical Provider, MD  lovastatin (MEVACOR) 40   MG tablet Take 40 mg by mouth at bedtime.      Historical Provider, MD  lubiprostone (AMITIZA) 24 MCG capsule Take 24 mcg by mouth every other day.    Historical Provider, MD  Magnesium 250 MG TABS Take 250 mg by mouth daily. OTC    Historical Provider, MD  prednisoLONE acetate (PRED FORTE) 1 % ophthalmic suspension Place 1 drop into both eyes 4 (four) times daily as needed (Dry Eyes).    Historical Provider, MD    Social History:  reports that he has never smoked. He has never used smokeless tobacco. He reports that he does not drink alcohol or use illicit  drugs.  Family History  Problem Relation Age of Onset  . Colon cancer Neg Hx   . Colon polyps Sister   . Colon polyps Sister      All the positives are listed in BOLD  Review of Systems:  HEENT: Headache, blurred vision, runny nose, sore throat Neck: Hypothyroidism, hyperthyroidism,,lymphadenopathy Chest : Shortness of breath, history of COPD, Asthma Heart : Chest pain, history of coronary arterey disease GI:  Nausea, vomiting, diarrhea, constipation, GERD GU: Dysuria, urgency, frequency of urination, hematuria Neuro: Stroke, seizures, syncope Psych: Depression, anxiety, hallucinations   Physical Exam: Blood pressure 122/59, pulse 99, temperature 97.6 F (36.4 C), temperature source Oral, resp. rate 18, height 6' 3" (1.905 m), weight 92.08 kg (203 lb), SpO2 98.00%. Constitutional:   Patient is a well-developed and well-nourished *male in no acute distress and cooperative with exam. Head: Normocephalic and atraumatic Mouth: Mucus membranes moist Eyes: PERRL, EOMI, conjunctivae normal Neck: Supple, No Thyromegaly Cardiovascular: RRR, S1 normal, S2 normal Pulmonary/Chest: CTAB, no wheezes, rales, or rhonchi Abdominal: Soft. Non-tender, non-distended, bowel sounds are normal, no masses, organomegaly, or guarding present.  Neurological: A&O x3, Strenght is normal and symmetric bilaterally, cranial nerve II-XII are grossly intact, no focal motor deficit, sensory intact to light touch bilaterally.  Extremities : No Cyanosis, Clubbing or Edema   Labs on Admission:  Results for orders placed during the hospital encounter of 10/07/13 (from the past 48 hour(s))  COMPREHENSIVE METABOLIC PANEL     Status: Abnormal   Collection Time    10/07/13  8:29 PM      Result Value Range   Sodium 137  137 - 147 mEq/L   Potassium 4.4  3.7 - 5.3 mEq/L   Chloride 94 (*) 96 - 112 mEq/L   CO2 24  19 - 32 mEq/L   Glucose, Bld 310 (*) 70 - 99 mg/dL   BUN 73 (*) 6 - 23 mg/dL   Creatinine, Ser 4.97  (*) 0.50 - 1.35 mg/dL   Calcium 9.2  8.4 - 10.5 mg/dL   Total Protein 7.9  6.0 - 8.3 g/dL   Albumin 3.7  3.5 - 5.2 g/dL   AST 32  0 - 37 U/L   ALT 19  0 - 53 U/L   Alkaline Phosphatase 78  39 - 117 U/L   Total Bilirubin 1.0  0.3 - 1.2 mg/dL   GFR calc non Af Amer 10 (*) >90 mL/min   GFR calc Af Amer 12 (*) >90 mL/min   Comment: (NOTE)     The eGFR has been calculated using the CKD EPI equation.     This calculation has not been validated in all clinical situations.     eGFR's persistently <90 mL/min signify possible Chronic Kidney     Disease.  CBC WITH DIFFERENTIAL     Status: Abnormal     Collection Time    10/07/13  8:29 PM      Result Value Range   WBC 15.3 (*) 4.0 - 10.5 K/uL   RBC 3.52 (*) 4.22 - 5.81 MIL/uL   Hemoglobin 12.3 (*) 13.0 - 17.0 g/dL   HCT 35.3 (*) 39.0 - 52.0 %   MCV 100.3 (*) 78.0 - 100.0 fL   MCH 34.9 (*) 26.0 - 34.0 pg   MCHC 34.8  30.0 - 36.0 g/dL   RDW 12.9  11.5 - 15.5 %   Platelets 250  150 - 400 K/uL   Neutrophils Relative % 77  43 - 77 %   Neutro Abs 11.8 (*) 1.7 - 7.7 K/uL   Lymphocytes Relative 14  12 - 46 %   Lymphs Abs 2.2  0.7 - 4.0 K/uL   Monocytes Relative 9  3 - 12 %   Monocytes Absolute 1.4 (*) 0.1 - 1.0 K/uL   Eosinophils Relative 0  0 - 5 %   Eosinophils Absolute 0.0  0.0 - 0.7 K/uL   Basophils Relative 0  0 - 1 %   Basophils Absolute 0.0  0.0 - 0.1 K/uL  URINALYSIS, ROUTINE W REFLEX MICROSCOPIC     Status: Abnormal   Collection Time    10/07/13  8:54 PM      Result Value Range   Color, Urine YELLOW  YELLOW   APPearance CLOUDY (*) CLEAR   Specific Gravity, Urine >1.030 (*) 1.005 - 1.030   pH 5.0  5.0 - 8.0   Glucose, UA 100 (*) NEGATIVE mg/dL   Hgb urine dipstick SMALL (*) NEGATIVE   Bilirubin Urine MODERATE (*) NEGATIVE   Ketones, ur 15 (*) NEGATIVE mg/dL   Protein, ur NEGATIVE  NEGATIVE mg/dL   Urobilinogen, UA 0.2  0.0 - 1.0 mg/dL   Nitrite NEGATIVE  NEGATIVE   Leukocytes, UA NEGATIVE  NEGATIVE  URINE MICROSCOPIC-ADD ON      Status: Abnormal   Collection Time    10/07/13  8:54 PM      Result Value Range   WBC, UA 0-2  <3 WBC/hpf   Bacteria, UA MANY (*) RARE   Casts HYALINE CASTS (*) NEGATIVE    Radiological Exams on Admission: Dg Chest 1 View  10/06/2013   CLINICAL DATA:  Status post right thoracentesis  EXAM: CHEST - 1 VIEW  COMPARISON:  10/04/2013  FINDINGS: There is no evidence of a pneumothorax status post thoracentesis. Decreased consolidative density within the right lung base is appreciated consistent with drainage of the patient's pleural effusion. Residual ill-defined density projects within the lung base representing either atelectasis or possibly diffuse infiltrate. There is blunting of the right costophrenic angle tracking along the lateral right hemi thorax. Residual effusion with loculation is of diagnostic consideration. . Cardiac silhouette is enlarged. It status post median sternotomy coronary artery bypass grafting. Degenerative changes identified within the right lower shoulders.  IMPRESSION: 1. No evidence for pneumothorax status post thoracentesis 2. Atelectasis versus infiltrate within the right lung base. 3. Small residual effusion possibly a component of loculation along the right hemithorax.   Electronically Signed   By: Margaree Mackintosh M.D.   On: 10/06/2013 15:51   Dg Chest 2 View  10/07/2013   CLINICAL DATA:  Thoracentesis yesterday.  Complaining of weakness.  EXAM: CHEST  2 VIEW  COMPARISON:  Chest x-ray 10/06/2013.  FINDINGS: Persistent moderate right-sided pleural effusion, likely partially loculated. The opacities throughout the base of the right lung favored to predominantly reflect some  passive atelectasis, although some underlying airspace consolidation is not excluded. Left lung is clear. No left pleural effusion. No pneumothorax. Pulmonary vasculature is normal. Heart size is mildly enlarged. Upper mediastinal contours are within normal limits. Atherosclerosis in the thoracic aorta.  Status post median sternotomy.  IMPRESSION: 1. Allowing for slight differences in patient positioning, the radiographic appearance the chest is unchanged, as above. Specifically, no pneumothorax identified.   Electronically Signed   By: Vinnie Langton M.D.   On: 10/07/2013 21:28   US Thoracentesis Asp Pleural Space W/img Guide  10/06/2013   CLINICAL DATA:  Coronary artery disease, prior CABG, dyspnea, recurrent large right pleural effusion. Request is made for diagnostic and therapeutic right thoracentesis.  EXAM: ULTRASOUND GUIDED DIAGNOSTIC AND THERAPEUTIC RIGHT THORACENTESIS  COMPARISON:  PRIOR THORACENTESIS ON 07/26/2013.  FINDINGS: A total of approximately 2 liters of yellow fluid was removed. A fluid sample wassent for laboratory analysis. Only the above amount of fluid was removed at this time secondary to patient coughing.  IMPRESSION: Successful ultrasound guided diagnostic and therapeutic right thoracentesis yielding 2 liters of pleural fluid.  Read by: Rowe Robert ,P.A.-C.  PROCEDURE: An ultrasound guided thoracentesis was thoroughly discussed with the patient and questions answered. The benefits, risks, alternatives and complications were also discussed. The patient understands and wishes to proceed with the procedure. Written consent was obtained.  Ultrasound was performed to localize and mark an adequate pocket of fluid in the right chest. The area was then prepped and draped in the normal sterile fashion. 1% Lidocaine was used for local anesthesia. Under ultrasound guidance a 19 gauge Yueh catheter was introduced. Thoracentesis was performed. The catheter was removed and a dressing applied.  Complications:  None immediate   Electronically Signed   By: Maryclare Bean M.D.   On: 10/06/2013 15:02    Assessment/Plan Principal Problem:   Acute renal failure Active Problems:   HTN (hypertension)   DM (diabetes mellitus)   Pleural effusion   AKI (acute kidney injury)   Acute kidney  injury Likely due to hypotensive episode post procedure on 1/15 when patient underwent ultrasound-guided thoracentesis. We'll get nephrology consultation in the morning. We'll start the patient on normal saline at 100 and per hour. Will obtain renal ultrasound in the morning.Foley catheter was placed without difficulty with very minimal urinary output.  Hypotension Blood pressure has improved with aggressive IV hydration in the ED. We'll cut down the IV fluids to 100 mL per hour  Diabetes mellitus To start the patient on sliding-scale insulin. Continue Lantus  Status post thoracentesis right pleural effusion Will give Vicodin for pain control Patient is not short of breath at this time  Chronic abdominal pain Patient complained of abdominal pain at [place] and underwent CT scan of the abdomen which was unremarkable Patient denies abdominal pain at this time We'll continue to monitor  Code status: patient is DO NOT RESUSCITATE  Family discussion: discussed with patient's wife at bedside   Time Spent on Admission: 106 min  [facility] Pager: [phone] 10/07/2013, 11:11 PM  If 7PM-7AM, please contact night-coverage  [email or web address]  Password [email or web address]

## 2013-10-08 ENCOUNTER — Inpatient Hospital Stay (HOSPITAL_COMMUNITY): Payer: Medicare HMO

## 2013-10-08 DIAGNOSIS — R109 Unspecified abdominal pain: Secondary | ICD-10-CM

## 2013-10-08 DIAGNOSIS — I4891 Unspecified atrial fibrillation: Secondary | ICD-10-CM

## 2013-10-08 LAB — COMPREHENSIVE METABOLIC PANEL
ALT: 15 U/L (ref 0–53)
AST: 27 U/L (ref 0–37)
Albumin: 3 g/dL — ABNORMAL LOW (ref 3.5–5.2)
Alkaline Phosphatase: 63 U/L (ref 39–117)
BUN: 73 mg/dL — ABNORMAL HIGH (ref 6–23)
CO2: 24 mEq/L (ref 19–32)
Calcium: 8.5 mg/dL (ref 8.4–10.5)
Chloride: 100 mEq/L (ref 96–112)
Creatinine, Ser: 5.12 mg/dL — ABNORMAL HIGH (ref 0.50–1.35)
GFR calc non Af Amer: 10 mL/min — ABNORMAL LOW (ref >90)
GFR, EST AFRICAN AMERICAN: 12 mL/min — AB (ref >90)
GLUCOSE: 150 mg/dL — AB (ref 70–99)
Potassium: 4.3 mEq/L (ref 3.7–5.3)
SODIUM: 139 meq/L (ref 137–147)
TOTAL PROTEIN: 6.4 g/dL (ref 6.0–8.3)
Total Bilirubin: 0.7 mg/dL (ref 0.3–1.2)

## 2013-10-08 LAB — GLUCOSE, CAPILLARY
GLUCOSE-CAPILLARY: 137 mg/dL — AB (ref 70–99)
GLUCOSE-CAPILLARY: 284 mg/dL — AB (ref 70–99)
Glucose-Capillary: 169 mg/dL — ABNORMAL HIGH (ref 70–99)
Glucose-Capillary: 195 mg/dL — ABNORMAL HIGH (ref 70–99)

## 2013-10-08 LAB — CBC
HCT: 33 % — ABNORMAL LOW (ref 39.0–52.0)
HEMOGLOBIN: 11 g/dL — AB (ref 13.0–17.0)
MCH: 33.5 pg (ref 26.0–34.0)
MCHC: 33.3 g/dL (ref 30.0–36.0)
MCV: 100.6 fL — ABNORMAL HIGH (ref 78.0–100.0)
Platelets: 214 10*3/uL (ref 150–400)
RBC: 3.28 MIL/uL — ABNORMAL LOW (ref 4.22–5.81)
RDW: 12.6 % (ref 11.5–15.5)
WBC: 10.8 10*3/uL — ABNORMAL HIGH (ref 4.0–10.5)

## 2013-10-08 MED ORDER — ONDANSETRON HCL 4 MG/2ML IJ SOLN
4.0000 mg | Freq: Four times a day (QID) | INTRAMUSCULAR | Status: DC | PRN
  Administered 2013-10-08: 4 mg via INTRAVENOUS
  Filled 2013-10-08: qty 2

## 2013-10-08 MED ORDER — FUROSEMIDE 10 MG/ML IJ SOLN
80.0000 mg | Freq: Two times a day (BID) | INTRAMUSCULAR | Status: DC
  Administered 2013-10-08 – 2013-10-11 (×7): 80 mg via INTRAVENOUS
  Filled 2013-10-08 (×7): qty 8

## 2013-10-08 NOTE — Progress Notes (Signed)
[facility] PROGRESS NOTE  Chris [name] [id number] DOB: 09-11-1941 DOA: 10/07/2013 PCP: Gar Ponto, MD  Assessment/Plan: 73 year old male with history of persistent right pleural effusion, CKD, diabetes mellitus, hypertension, a fib who was just discharged today from [facility], came back to the ED with inability to pass urine for one day. Patient was seen by CT surgery and recommended to undergo VATS procedure, but patient refused to undergo VATS and instead underwent ultrasound-guided thoracentesis on 1/15. Post procedure patient was found to be hypotensive but as patient's blood pressure improved he was discharged home. As per patient he has not been able to urinate since yesterday, in the ED today patient's labs reveal acute kidney injury with creatinine of 4.97 which is up from his baseline 2.05 as of 1/12.  1. AKI on CKD III likely multifactorial prerenal vs cardiorenal, ATN - per nephrology; appreciate nephrology management, on IV lasix, prn IVF   2. Diabetes mellitus; last HA1C-8.1; cont insulin regimen   3. Recurrent R pleural effusion;s/p thoracentesis right pleural effusion; refused VATS procedure -cont prn bronchodilators; Vicodin for pain control   4. A fib; patient is on ASA; he refused anticoagulation; I d/w patient again he is aware of risk of CVA, refused anticoagulation   5. Chronic abdominal pain; Patient complained of abdominal pain at [place] and underwent CT scan of the abdomen which was unremarkable; Patient denies abdominal pain at this time  - continue to monitor    Code status: patient is DO NOT RESUSCITATE   Code Status: DNR Family Communication: d/w patient (indicate person spoken with, relationship, and if by phone, the number) Disposition Plan: home when stable    Consultants:  Nephrology   Procedures:  None   Antibiotics:  None  (indicate start date, and stop date if  known)  HPI/Subjective: alert  Objective: Filed Vitals:   10/08/13 0457  BP: 112/62  Pulse: 104  Temp: 98.2 F (36.8 C)  Resp: 20    Intake/Output Summary (Last 24 hours) at 10/08/13 1055 Last data filed at 10/08/13 0900  Gross per 24 hour  Intake      0 ml  Output    200 ml  Net   -200 ml   Filed Weights   10/07/13 1953 10/07/13 2335  Weight: 92.08 kg (203 lb) 96.7 kg (213 lb 3 oz)    Exam:   General:  alert  Cardiovascular: s1,s2 irregular   Respiratory: R lung decreased AE  Abdomen: soft, nt, nd  Musculoskeletal: no edema   Data Reviewed: Basic Metabolic Panel:  Recent Labs Lab 10/04/13 0022 10/07/13 2029 10/08/13 0554  NA 137 137 139  K 4.9 4.4 4.3  CL 96 94* 100  CO2 28 24 24   GLUCOSE 231* 310* 150*  BUN 52* 73* 73*  CREATININE 2.05* 4.97* 5.12*  CALCIUM 9.5 9.2 8.5   Liver Function Tests:  Recent Labs Lab 10/04/13 0022 10/07/13 2029 10/08/13 0554  AST 28 32 27  ALT 20 19 15   ALKPHOS 81 78 63  BILITOT 1.0 1.0 0.7  PROT 8.5* 7.9 6.4  ALBUMIN 3.9 3.7 3.0*    Recent Labs Lab 10/04/13 0022  LIPASE 30   No results found for this basename: AMMONIA,  in the last 168 hours CBC:  Recent Labs Lab 10/04/13 0022 10/07/13 2029 10/08/13 0554  WBC 7.8 15.3* 10.8*  NEUTROABS 5.3 11.8*  --   HGB 12.3* 12.3* 11.0*  HCT 34.4* 35.3* 33.0*  MCV 98.3 100.3* 100.6*  PLT  222 250 214   Cardiac Enzymes: No results found for this basename: CKTOTAL, CKMB, CKMBINDEX, TROPONINI,  in the last 168 hours BNP (last 3 results)  Recent Labs  08/03/13 1347  PROBNP 1834.0*   CBG:  Recent Labs Lab 10/06/13 1955 10/06/13 2345 10/07/13 0340 10/07/13 0753 10/08/13 0744  GLUCAP 257* 289* 262* 229* 137*    No results found for this or any previous visit (from the past 240 hour(s)).   Studies: Dg Chest 1 View  10/06/2013   CLINICAL DATA:  Status post right thoracentesis  EXAM: CHEST - 1 VIEW  COMPARISON:  10/04/2013  FINDINGS: There is no  evidence of a pneumothorax status post thoracentesis. Decreased consolidative density within the right lung base is appreciated consistent with drainage of the patient's pleural effusion. Residual ill-defined density projects within the lung base representing either atelectasis or possibly diffuse infiltrate. There is blunting of the right costophrenic angle tracking along the lateral right hemi thorax. Residual effusion with loculation is of diagnostic consideration. . Cardiac silhouette is enlarged. It status post median sternotomy coronary artery bypass grafting. Degenerative changes identified within the right lower shoulders.  IMPRESSION: 1. No evidence for pneumothorax status post thoracentesis 2. Atelectasis versus infiltrate within the right lung base. 3. Small residual effusion possibly a component of loculation along the right hemithorax.   Electronically Signed   By: Margaree Mackintosh M.D.   On: 10/06/2013 15:51   Dg Chest 2 View  10/07/2013   CLINICAL DATA:  Thoracentesis yesterday.  Complaining of weakness.  EXAM: CHEST  2 VIEW  COMPARISON:  Chest x-ray 10/06/2013.  FINDINGS: Persistent moderate right-sided pleural effusion, likely partially loculated. The opacities throughout the base of the right lung favored to predominantly reflect some passive atelectasis, although some underlying airspace consolidation is not excluded. Left lung is clear. No left pleural effusion. No pneumothorax. Pulmonary vasculature is normal. Heart size is mildly enlarged. Upper mediastinal contours are within normal limits. Atherosclerosis in the thoracic aorta. Status post median sternotomy.  IMPRESSION: 1. Allowing for slight differences in patient positioning, the radiographic appearance the chest is unchanged, as above. Specifically, no pneumothorax identified.   Electronically Signed   By: Vinnie Langton M.D.   On: 10/07/2013 21:28   US Thoracentesis Asp Pleural Space W/img Guide  10/06/2013   CLINICAL DATA:   Coronary artery disease, prior CABG, dyspnea, recurrent large right pleural effusion. Request is made for diagnostic and therapeutic right thoracentesis.  EXAM: ULTRASOUND GUIDED DIAGNOSTIC AND THERAPEUTIC RIGHT THORACENTESIS  COMPARISON:  PRIOR THORACENTESIS ON 07/26/2013.  FINDINGS: A total of approximately 2 liters of yellow fluid was removed. A fluid sample wassent for laboratory analysis. Only the above amount of fluid was removed at this time secondary to patient coughing.  IMPRESSION: Successful ultrasound guided diagnostic and therapeutic right thoracentesis yielding 2 liters of pleural fluid.  Read by: Rowe Robert ,P.A.-C.  PROCEDURE: An ultrasound guided thoracentesis was thoroughly discussed with the patient and questions answered. The benefits, risks, alternatives and complications were also discussed. The patient understands and wishes to proceed with the procedure. Written consent was obtained.  Ultrasound was performed to localize and mark an adequate pocket of fluid in the right chest. The area was then prepped and draped in the normal sterile fashion. 1% Lidocaine was used for local anesthesia. Under ultrasound guidance a 19 gauge Yueh catheter was introduced. Thoracentesis was performed. The catheter was removed and a dressing applied.  Complications:  None immediate   Electronically Signed   By:  Art  Hoss M.D.   On: 10/06/2013 15:02    Scheduled Meds: . aspirin EC  81 mg Oral Daily  . furosemide  80 mg Intravenous BID  . heparin  5,000 Units Subcutaneous Q8H  . insulin aspart  0-9 Units Subcutaneous TID WC  . insulin detemir  40 Units Subcutaneous Daily  . simvastatin  20 mg Oral q1800   Continuous Infusions: . sodium chloride 135 mL/hr at 10/08/13 1003    Principal Problem:   Acute renal failure Active Problems:   HTN (hypertension)   DM (diabetes mellitus)   Pleural effusion   AKI (acute kidney injury)    Time spent: >35 minutes     Kinnie Feil  [facility] Pager [phone]. If 7PM-7AM, please contact night-coverage at [email or web address], password [facility] 10/08/2013, 10:55 AM  LOS: 1 day

## 2013-10-08 NOTE — Consult Note (Signed)
Reason for Consult:Worsening of renal failure Referring Physician: Dr.Buriev  MARKE GOODWYN is an 73 y.o. male.  HPI: He is a patient who has history of diabetes, hypertension, arterial fibrillation and chronic kidney disease stage III presently came with complaints of difficulty in passing urine. According to the patient has been admitted to [facility] on hospital because of difficulty in breathing. During his evaluation patient was found to pleural effusion is status post thoracentesis. After he was discharged home patient to start complaining of weakness, poor appetite and difficulty in micturition. Presently he complains of pain from the catheter. He denies any nausea vomiting her still complains of feeling weak and tired. His appetite is not that good. Presently he denies any fever chills sweating or diarrhea.  Past Medical History  Diagnosis Date  . 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 Diabetes mellitus   . Macrocytosis     normal B12 & folate levels  . Ischemic cardiomyopathy     EF of 45-50% - posterior & inferior walls are severly hypokinetic  . Atrial fibrillation     not candidate for chronic anticoagulation with Coumadin given chrronic epistaxis as well as hemorrhagic conversion of his acute cva   . Hyperlipidemia   . Hypertension   . Complication of anesthesia   . PONV (postoperative nausea and vomiting)   . Acute renal insufficiency     resolved  . Myocardial infarct   . CHF (congestive heart failure)   . Shortness of breath   . Pleural effusion 10/05/2013    Past Surgical History  Procedure Laterality Date  . Coronary artery bypass graft    . Tonsillectomy    . Esophagogastroduodenoscopy (egd) with esophageal dilation N/A 10/27/2012    Dr. Gala Romney: distal esophageal diverticulum, non-complete Schatzki's ring s/p dilation, distal esophageal nodule with benign path, negative Barrett's  . Cholecystectomy   2008  . Colonoscopy N/A 12/23/2012    Procedure: COLONOSCOPY;  Surgeon: Daneil Dolin, MD;  Location: [facility];  Service: Endoscopy;  Laterality: N/A;  9:45  . Cardiac surgery      Family History  Problem Relation Age of Onset  . Colon cancer Neg Hx   . Colon polyps Sister   . Colon polyps Sister     Social History:  reports that he has never smoked. He has never used smokeless tobacco. He reports that he does not drink alcohol or use illicit drugs.  Allergies:  Allergies  Allergen Reactions  . Ciprofloxacin Other (See Comments)    Sores all on legs.   . Finasteride     Affected  Patients testicles  . Metoclopramide     Jerking all over  . Xanax [Alprazolam] Other (See Comments)    Not in right state of mind.     Medications: I have reviewed the patient's current medications.  Results for orders placed during the hospital encounter of 10/07/13 (from the past 48 hour(s))  COMPREHENSIVE METABOLIC PANEL     Status: Abnormal   Collection Time    10/07/13  8:29 PM      Result Value Range   Sodium 137  137 - 147 mEq/L   Potassium 4.4  3.7 - 5.3 mEq/L   Chloride 94 (*) 96 - 112 mEq/L   CO2 24  19 - 32 mEq/L   Glucose, Bld 310 (*) 70 - 99 mg/dL   BUN 73 (*) 6 - 23 mg/dL   Creatinine,  Ser 4.97 (*) 0.50 - 1.35 mg/dL   Calcium 9.2  8.4 - 10.5 mg/dL   Total Protein 7.9  6.0 - 8.3 g/dL   Albumin 3.7  3.5 - 5.2 g/dL   AST 32  0 - 37 U/L   ALT 19  0 - 53 U/L   Alkaline Phosphatase 78  39 - 117 U/L   Total Bilirubin 1.0  0.3 - 1.2 mg/dL   GFR calc non Af Amer 10 (*) >90 mL/min   GFR calc Af Amer 12 (*) >90 mL/min   Comment: (NOTE)     The eGFR has been calculated using the CKD EPI equation.     This calculation has not been validated in all clinical situations.     eGFR's persistently <90 mL/min signify possible Chronic Kidney     Disease.  CBC WITH DIFFERENTIAL     Status: Abnormal   Collection Time    10/07/13  8:29 PM      Result Value Range   WBC 15.3 (*) 4.0 -  10.5 K/uL   RBC 3.52 (*) 4.22 - 5.81 MIL/uL   Hemoglobin 12.3 (*) 13.0 - 17.0 g/dL   HCT 35.3 (*) 39.0 - 52.0 %   MCV 100.3 (*) 78.0 - 100.0 fL   MCH 34.9 (*) 26.0 - 34.0 pg   MCHC 34.8  30.0 - 36.0 g/dL   RDW 12.9  11.5 - 15.5 %   Platelets 250  150 - 400 K/uL   Neutrophils Relative % 77  43 - 77 %   Neutro Abs 11.8 (*) 1.7 - 7.7 K/uL   Lymphocytes Relative 14  12 - 46 %   Lymphs Abs 2.2  0.7 - 4.0 K/uL   Monocytes Relative 9  3 - 12 %   Monocytes Absolute 1.4 (*) 0.1 - 1.0 K/uL   Eosinophils Relative 0  0 - 5 %   Eosinophils Absolute 0.0  0.0 - 0.7 K/uL   Basophils Relative 0  0 - 1 %   Basophils Absolute 0.0  0.0 - 0.1 K/uL  URINALYSIS, ROUTINE W REFLEX MICROSCOPIC     Status: Abnormal   Collection Time    10/07/13  8:54 PM      Result Value Range   Color, Urine YELLOW  YELLOW   APPearance CLOUDY (*) CLEAR   Specific Gravity, Urine >1.030 (*) 1.005 - 1.030   pH 5.0  5.0 - 8.0   Glucose, UA 100 (*) NEGATIVE mg/dL   Hgb urine dipstick SMALL (*) NEGATIVE   Bilirubin Urine MODERATE (*) NEGATIVE   Ketones, ur 15 (*) NEGATIVE mg/dL   Protein, ur NEGATIVE  NEGATIVE mg/dL   Urobilinogen, UA 0.2  0.0 - 1.0 mg/dL   Nitrite NEGATIVE  NEGATIVE   Leukocytes, UA NEGATIVE  NEGATIVE  URINE MICROSCOPIC-ADD ON     Status: Abnormal   Collection Time    10/07/13  8:54 PM      Result Value Range   WBC, UA 0-2  <3 WBC/hpf   Bacteria, UA MANY (*) RARE   Casts HYALINE CASTS (*) NEGATIVE  CBC     Status: Abnormal   Collection Time    10/08/13  5:54 AM      Result Value Range   WBC 10.8 (*) 4.0 - 10.5 K/uL   RBC 3.28 (*) 4.22 - 5.81 MIL/uL   Hemoglobin 11.0 (*) 13.0 - 17.0 g/dL   HCT 33.0 (*) 39.0 - 52.0 %   MCV 100.6 (*) 78.0 -  100.0 fL   MCH 33.5  26.0 - 34.0 pg   MCHC 33.3  30.0 - 36.0 g/dL   RDW 94.8  54.6 - 27.0 %   Platelets 214  150 - 400 K/uL  COMPREHENSIVE METABOLIC PANEL     Status: Abnormal   Collection Time    10/08/13  5:54 AM      Result Value Range   Sodium 139  137  - 147 mEq/L   Potassium 4.3  3.7 - 5.3 mEq/L   Chloride 100  96 - 112 mEq/L   CO2 24  19 - 32 mEq/L   Glucose, Bld 150 (*) 70 - 99 mg/dL   BUN 73 (*) 6 - 23 mg/dL   Creatinine, Ser 3.50 (*) 0.50 - 1.35 mg/dL   Calcium 8.5  8.4 - 09.3 mg/dL   Total Protein 6.4  6.0 - 8.3 g/dL   Albumin 3.0 (*) 3.5 - 5.2 g/dL   AST 27  0 - 37 U/L   ALT 15  0 - 53 U/L   Alkaline Phosphatase 63  39 - 117 U/L   Total Bilirubin 0.7  0.3 - 1.2 mg/dL   GFR calc non Af Amer 10 (*) >90 mL/min   GFR calc Af Amer 12 (*) >90 mL/min   Comment: (NOTE)     The eGFR has been calculated using the CKD EPI equation.     This calculation has not been validated in all clinical situations.     eGFR's persistently <90 mL/min signify possible Chronic Kidney     Disease.  GLUCOSE, CAPILLARY     Status: Abnormal   Collection Time    10/08/13  7:44 AM      Result Value Range   Glucose-Capillary 137 (*) 70 - 99 mg/dL   Comment 1 Notify RN     Comment 2 Documented in Chart      Dg Chest 1 View  10/06/2013   CLINICAL DATA:  Status post right thoracentesis  EXAM: CHEST - 1 VIEW  COMPARISON:  10/04/2013  FINDINGS: There is no evidence of a pneumothorax status post thoracentesis. Decreased consolidative density within the right lung base is appreciated consistent with drainage of the patient's pleural effusion. Residual ill-defined density projects within the lung base representing either atelectasis or possibly diffuse infiltrate. There is blunting of the right costophrenic angle tracking along the lateral right hemi thorax. Residual effusion with loculation is of diagnostic consideration. . Cardiac silhouette is enlarged. It status post median sternotomy coronary artery bypass grafting. Degenerative changes identified within the right lower shoulders.  IMPRESSION: 1. No evidence for pneumothorax status post thoracentesis 2. Atelectasis versus infiltrate within the right lung base. 3. Small residual effusion possibly a component of  loculation along the right hemithorax.   Electronically Signed   By: Salome Holmes M.D.   On: 10/06/2013 15:51   Dg Chest 2 View  10/07/2013   CLINICAL DATA:  Thoracentesis yesterday.  Complaining of weakness.  EXAM: CHEST  2 VIEW  COMPARISON:  Chest x-ray 10/06/2013.  FINDINGS: Persistent moderate right-sided pleural effusion, likely partially loculated. The opacities throughout the base of the right lung favored to predominantly reflect some passive atelectasis, although some underlying airspace consolidation is not excluded. Left lung is clear. No left pleural effusion. No pneumothorax. Pulmonary vasculature is normal. Heart size is mildly enlarged. Upper mediastinal contours are within normal limits. Atherosclerosis in the thoracic aorta. Status post median sternotomy.  IMPRESSION: 1. Allowing for slight differences in patient positioning,  the radiographic appearance the chest is unchanged, as above. Specifically, no pneumothorax identified.   Electronically Signed   By: Vinnie Langton M.D.   On: 10/07/2013 21:28   US Thoracentesis Asp Pleural Space W/img Guide  10/06/2013   CLINICAL DATA:  Coronary artery disease, prior CABG, dyspnea, recurrent large right pleural effusion. Request is made for diagnostic and therapeutic right thoracentesis.  EXAM: ULTRASOUND GUIDED DIAGNOSTIC AND THERAPEUTIC RIGHT THORACENTESIS  COMPARISON:  PRIOR THORACENTESIS ON 07/26/2013.  FINDINGS: A total of approximately 2 liters of yellow fluid was removed. A fluid sample wassent for laboratory analysis. Only the above amount of fluid was removed at this time secondary to patient coughing.  IMPRESSION: Successful ultrasound guided diagnostic and therapeutic right thoracentesis yielding 2 liters of pleural fluid.  Read by: Rowe Robert ,P.A.-C.  PROCEDURE: An ultrasound guided thoracentesis was thoroughly discussed with the patient and questions answered. The benefits, risks, alternatives and complications were also discussed.  The patient understands and wishes to proceed with the procedure. Written consent was obtained.  Ultrasound was performed to localize and mark an adequate pocket of fluid in the right chest. The area was then prepped and draped in the normal sterile fashion. 1% Lidocaine was used for local anesthesia. Under ultrasound guidance a 19 gauge Yueh catheter was introduced. Thoracentesis was performed. The catheter was removed and a dressing applied.  Complications:  None immediate   Electronically Signed   By: Maryclare Bean M.D.   On: 10/06/2013 15:02    Review of Systems  Constitutional: Negative for fever.  Respiratory: Positive for shortness of breath.   Cardiovascular: Positive for leg swelling. Negative for chest pain.  Gastrointestinal: Positive for nausea. Negative for vomiting and diarrhea.  Neurological: Positive for dizziness and weakness.   Blood pressure 112/62, pulse 104, temperature 98.2 F (36.8 C), temperature source Oral, resp. rate 20, height $RemoveBe'6\' 2"'PPnTvDRlm$  (1.88 m), weight 96.7 kg (213 lb 3 oz), SpO2 96.00%. Physical Exam  Constitutional: No distress.  Eyes: No scleral icterus.  Neck: No JVD present.  Cardiovascular: Exam reveals no gallop.   Respiratory: No respiratory distress. He has no wheezes. He has no rales.  GI: He exhibits no distension. There is no tenderness.  Musculoskeletal: He exhibits edema.    Assessment/Plan: Problem #1 acute kidney injury: Possibly multifactorial including ATN/prerenal/obstructive/cardiorenal. Presently his BUN and creatinine still increasing. Patient has this moment does not have any uremic sinus symptoms. Problem #2 chronic renal failure stage III: Etiology is multifactorial including diabetes/hypertensive nephrosclerosis/ischemic. 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 Problem #3 diabetes Her problem #4 trial fibrillation Problem #5 coronary disease status  post CABG Problem #6 history of her right pleural effusion status post thoracentesis Problem #7 acute possibly embolic CVA. Plan: Agree with hydration and will increase his IV fluid to 135 cc per hour Will start patient on Lasix to improve his urine output. We'll check his intact PTH, phosphorus and basic metabolic panel in the morning. Presently patient doesn't need any dialysis.  Antonin Meininger S 10/08/2013, 9:04 AM

## 2013-10-09 LAB — BASIC METABOLIC PANEL
BUN: 74 mg/dL — ABNORMAL HIGH (ref 6–23)
CHLORIDE: 103 meq/L (ref 96–112)
CO2: 24 mEq/L (ref 19–32)
Calcium: 8.3 mg/dL — ABNORMAL LOW (ref 8.4–10.5)
Creatinine, Ser: 4.43 mg/dL — ABNORMAL HIGH (ref 0.50–1.35)
GFR calc Af Amer: 14 mL/min — ABNORMAL LOW (ref >90)
GFR, EST NON AFRICAN AMERICAN: 12 mL/min — AB (ref >90)
Glucose, Bld: 119 mg/dL — ABNORMAL HIGH (ref 70–99)
POTASSIUM: 4.1 meq/L (ref 3.7–5.3)
Sodium: 140 mEq/L (ref 137–147)

## 2013-10-09 LAB — GLUCOSE, CAPILLARY
GLUCOSE-CAPILLARY: 114 mg/dL — AB (ref 70–99)
GLUCOSE-CAPILLARY: 166 mg/dL — AB (ref 70–99)
Glucose-Capillary: 160 mg/dL — ABNORMAL HIGH (ref 70–99)
Glucose-Capillary: 138 mg/dL — ABNORMAL HIGH (ref 70–99)
Glucose-Capillary: 252 mg/dL — ABNORMAL HIGH (ref 70–99)
Glucose-Capillary: 142 mg/dL — ABNORMAL HIGH (ref 70–99)

## 2013-10-09 LAB — CBC
HEMATOCRIT: 31.9 % — AB (ref 39.0–52.0)
HEMOGLOBIN: 11.1 g/dL — AB (ref 13.0–17.0)
MCH: 34.9 pg — ABNORMAL HIGH (ref 26.0–34.0)
MCHC: 34.8 g/dL (ref 30.0–36.0)
MCV: 100.3 fL — ABNORMAL HIGH (ref 78.0–100.0)
Platelets: 184 10*3/uL (ref 150–400)
RBC: 3.18 MIL/uL — AB (ref 4.22–5.81)
RDW: 12.8 % (ref 11.5–15.5)
WBC: 6.5 10*3/uL (ref 4.0–10.5)

## 2013-10-09 LAB — PHOSPHORUS: PHOSPHORUS: 4.3 mg/dL (ref 2.3–4.6)

## 2013-10-09 MED ORDER — METOPROLOL TARTRATE 25 MG PO TABS
25.0000 mg | ORAL_TABLET | Freq: Two times a day (BID) | ORAL | Status: DC
  Administered 2013-10-09 – 2013-10-11 (×5): 25 mg via ORAL
  Filled 2013-10-09 (×5): qty 1

## 2013-10-09 NOTE — Progress Notes (Signed)
[facility] PROGRESS NOTE  Chris [name] KNL:[id number] DOB: 09-13-1941 DOA: 10/07/2013 PCP: Gar Ponto, MD  Assessment/Plan: 73 year old male with history of persistent right pleural effusion, CKD, diabetes mellitus, hypertension, a fib who was just discharged today from [facility], came back to the ED with inability to pass urine for one day. Patient was seen by CT surgery and recommended to undergo VATS procedure, but patient refused to undergo VATS and instead underwent ultrasound-guided thoracentesis on 1/15. Post procedure patient was found to be hypotensive but as patient's blood pressure improved he was discharged home. As per patient he has not been able to urinate since yesterday, in the ED today patient's labs reveal acute kidney injury with creatinine of 4.97 which is up from his baseline 2.05 as of 1/12.  1. AKI on CKD III likely multifactorial prerenal vs cardiorenal, ATN - improving; defer to nephrology; appreciate nephrology management   2. Diabetes mellitus; last HA1C-8.1; cont insulin regimen   3. Recurrent R pleural effusion;s/p thoracentesis right pleural effusion; refused VATS procedure -cont prn bronchodilators; Vicodin for pain control   4. A fib; patient is on ASA; he refused anticoagulation; I d/w patient again he is aware of risk of CVA, refused anticoagulation; cont BB  5. Chronic abdominal pain; Patient complained of abdominal pain at [facility] and underwent CT scan of the abdomen which was unremarkable; Patient denies abdominal pain at this time  - continue to monitor    Code status: patient is DO NOT RESUSCITATE   Code Status: DNR Family Communication: d/w patient (indicate person spoken with, relationship, and if by phone, the number) Disposition Plan: home when stable    Consultants:  Nephrology   Procedures:  None   Antibiotics:  None  (indicate start date, and stop date if  known)  HPI/Subjective: alert  Objective: Filed Vitals:   10/09/13 0408  BP: 136/67  Pulse: 94  Temp: 98.1 F (36.7 C)  Resp: 20    Intake/Output Summary (Last 24 hours) at 10/09/13 1027 Last data filed at 10/09/13 0940  Gross per 24 hour  Intake 4133.25 ml  Output   1900 ml  Net 2233.25 ml   Filed Weights   10/07/13 1953 10/07/13 2335 10/09/13 0500  Weight: 92.08 kg (203 lb) 96.7 kg (213 lb 3 oz) 98.8 kg (217 lb 13 oz)    Exam:   General:  alert  Cardiovascular: s1,s2 irregular   Respiratory: R lung decreased AE  Abdomen: soft, nt, nd  Musculoskeletal: no edema   Data Reviewed: Basic Metabolic Panel:  Recent Labs Lab 10/04/13 0022 10/07/13 2029 10/08/13 0554 10/09/13 0557  NA 137 137 139 140  K 4.9 4.4 4.3 4.1  CL 96 94* 100 103  CO2 28 24 24 24   GLUCOSE 231* 310* 150* 119*  BUN 52* 73* 73* 74*  CREATININE 2.05* 4.97* 5.12* 4.43*  CALCIUM 9.5 9.2 8.5 8.3*  PHOS  --   --   --  4.3   Liver Function Tests:  Recent Labs Lab 10/04/13 0022 10/07/13 2029 10/08/13 0554  AST 28 32 27  ALT 20 19 15   ALKPHOS 81 78 63  BILITOT 1.0 1.0 0.7  PROT 8.5* 7.9 6.4  ALBUMIN 3.9 3.7 3.0*    Recent Labs Lab 10/04/13 0022  LIPASE 30   No results found for this basename: AMMONIA,  in the last 168 hours CBC:  Recent Labs Lab 10/04/13 0022 10/07/13 2029 10/08/13 0554 10/09/13 0557  WBC 7.8 15.3* 10.8* 6.5  NEUTROABS 5.3 11.8*  --   --   HGB 12.3* 12.3* 11.0* 11.1*  HCT 34.4* 35.3* 33.0* 31.9*  MCV 98.3 100.3* 100.6* 100.3*  PLT 222 250 214 184   Cardiac Enzymes: No results found for this basename: CKTOTAL, CKMB, CKMBINDEX, TROPONINI,  in the last 168 hours BNP (last 3 results)  Recent Labs  08/03/13 1347  PROBNP 1834.0*   CBG:  Recent Labs Lab 10/08/13 1629 10/08/13 2143 10/09/13 0107 10/09/13 0718 10/09/13 0938  GLUCAP 284* 195* 160* 114* 138*    No results found for this or any previous visit (from the past 240 hour(s)).    Studies: Dg Chest 2 View  10/07/2013   CLINICAL DATA:  Thoracentesis yesterday.  Complaining of weakness.  EXAM: CHEST  2 VIEW  COMPARISON:  Chest x-ray 10/06/2013.  FINDINGS: Persistent moderate right-sided pleural effusion, likely partially loculated. The opacities throughout the base of the right lung favored to predominantly reflect some passive atelectasis, although some underlying airspace consolidation is not excluded. Left lung is clear. No left pleural effusion. No pneumothorax. Pulmonary vasculature is normal. Heart size is mildly enlarged. Upper mediastinal contours are within normal limits. Atherosclerosis in the thoracic aorta. Status post median sternotomy.  IMPRESSION: 1. Allowing for slight differences in patient positioning, the radiographic appearance the chest is unchanged, as above. Specifically, no pneumothorax identified.   Electronically Signed   By: Vinnie Langton M.D.   On: 10/07/2013 21:28   US Renal  10/08/2013   CLINICAL DATA:  Acute renal insufficiency, history of diabetes and hypertension.  EXAM: RENAL/URINARY TRACT ULTRASOUND COMPLETE  COMPARISON:  None.  FINDINGS: Right Kidney:  Length: 10.5 cm. The renal cortical echotexture is increased and is approximately equal to that of the adjacent liver. There is mild cortical thinning diffusely. There is no focal mass nor ductal dilation. There is no hydronephrosis.  Left Kidney:  Length: 11.4 cm. The cortical echotexture on the left is subjectively mildly increased. There is mild cortical thinning. There is no focal renal mass nor evidence of hydronephrosis on the left.  Bladder:  The urinary bladder is decompressed.  A Foley catheter is present.  IMPRESSION: 1. Neither kidney exhibits evidence of obstruction. 2. There is mild cortical atrophy. The echotexture of the renal cortex bilaterally is increased suggesting medical renal disease.   Electronically Signed   By: David  Martinique   On: 10/08/2013 10:55    Scheduled Meds: .  aspirin EC  81 mg Oral Daily  . furosemide  80 mg Intravenous BID  . heparin  5,000 Units Subcutaneous Q8H  . insulin aspart  0-9 Units Subcutaneous TID WC  . insulin detemir  40 Units Subcutaneous Daily  . simvastatin  20 mg Oral q1800   Continuous Infusions: . sodium chloride 135 mL/hr at 10/09/13 9622    Principal Problem:   Acute renal failure Active Problems:   HTN (hypertension)   DM (diabetes mellitus)   Pleural effusion   AKI (acute kidney injury)    Time spent: >35 minutes     Kinnie Feil  [facility] Pager [phone]. If 7PM-7AM, please contact night-coverage at [email or web address], password [facility] 10/09/2013, 10:27 AM  LOS: 2 days

## 2013-10-09 NOTE — Progress Notes (Signed)
Subjective: Interval History: has no complaint of no nausea or vomiting. Patient is still that he's feeling better. He denies any difficulty breathing. His main complaints is pain from his Foley catheter..  Objective: Vital signs in last 24 hours: Temp:  [98.1 F (36.7 C)-98.4 F (36.9 C)] 98.1 F (36.7 C) (01/18 0408) Pulse Rate:  [94-102] 94 (01/18 0408) Resp:  [18-20] 20 (01/18 0408) BP: (116-136)/(58-70) 136/67 mmHg (01/18 0408) SpO2:  [97 %-98 %] 97 % (01/18 0408) Weight:  [98.8 kg (217 lb 13 oz)] 98.8 kg (217 lb 13 oz) (01/18 0500) Weight change: 6.72 kg (14 lb 13 oz)  Intake/Output from previous day: 01/17 0701 - 01/18 0700 In: 3773.3 [P.O.:1080; I.V.:2693.3] Out: 1475 [Urine:1475] Intake/Output this shift:    Generally he is alert and apparent distress. HEENT exam no conjunctival pallor and icterus. Chest decreased breath sound right greater than left. His heart exam revealed regular rate and rhythm no murmur. Extremities no edema.  Lab Results:  Recent Labs  10/08/13 0554 10/09/13 0557  WBC 10.8* 6.5  HGB 11.0* 11.1*  HCT 33.0* 31.9*  PLT 214 184   BMET:  Recent Labs  10/08/13 0554 10/09/13 0557  NA 139 140  K 4.3 4.1  CL 100 103  CO2 24 24  GLUCOSE 150* 119*  BUN 73* 74*  CREATININE 5.12* 4.43*  CALCIUM 8.5 8.3*   No results found for this basename: PTH,  in the last 72 hours Iron Studies: No results found for this basename: IRON, TIBC, TRANSFERRIN, FERRITIN,  in the last 72 hours  Studies/Results: Dg Chest 2 View  10/07/2013   CLINICAL DATA:  Thoracentesis yesterday.  Complaining of weakness.  EXAM: CHEST  2 VIEW  COMPARISON:  Chest x-ray 10/06/2013.  FINDINGS: Persistent moderate right-sided pleural effusion, likely partially loculated. The opacities throughout the base of the right lung favored to predominantly reflect some passive atelectasis, although some underlying airspace consolidation is not excluded. Left lung is clear. No left pleural  effusion. No pneumothorax. Pulmonary vasculature is normal. Heart size is mildly enlarged. Upper mediastinal contours are within normal limits. Atherosclerosis in the thoracic aorta. Status post median sternotomy.  IMPRESSION: 1. Allowing for slight differences in patient positioning, the radiographic appearance the chest is unchanged, as above. Specifically, no pneumothorax identified.   Electronically Signed   By: Vinnie Langton M.D.   On: 10/07/2013 21:28   US Renal  10/08/2013   CLINICAL DATA:  Acute renal insufficiency, history of diabetes and hypertension.  EXAM: RENAL/URINARY TRACT ULTRASOUND COMPLETE  COMPARISON:  None.  FINDINGS: Right Kidney:  Length: 10.5 cm. The renal cortical echotexture is increased and is approximately equal to that of the adjacent liver. There is mild cortical thinning diffusely. There is no focal mass nor ductal dilation. There is no hydronephrosis.  Left Kidney:  Length: 11.4 cm. The cortical echotexture on the left is subjectively mildly increased. There is mild cortical thinning. There is no focal renal mass nor evidence of hydronephrosis on the left.  Bladder:  The urinary bladder is decompressed.  A Foley catheter is present.  IMPRESSION: 1. Neither kidney exhibits evidence of obstruction. 2. There is mild cortical atrophy. The echotexture of the renal cortex bilaterally is increased suggesting medical renal disease.   Electronically Signed   By: David  Martinique   On: 10/08/2013 10:55    I have reviewed the patient's current medications.  Assessment/Plan: Problem #1 acute kidney injury superimposed on chronic his BUN and creatinine presently is improving. Patient does  not have any uremic sign and  symptoms. Problem #2 history of CHF: Presently patient is none oliguric. He has 1400 cc of urine and no sign of fluid overload. Problem #3 hypertension his blood pressure seems reasonably controlled Problem #4 metabolic bone disease his calcium and phosphorus isn't  range. Problem #5 history of a trial fibrillation: His heart rate is controlled Problem #6 history of CVA Problem #7 history of diabetes Problem #8 pleural effusion: He status post thoracentesis. Patient is still seems to have moderate right-sided pleural effusion. Plan: We'll continue his present management Check his basic metabolic panel in the morning.    LOS: 2 days   Chris [name] S 10/09/2013,9:34 AM

## 2013-10-10 LAB — PTH, INTACT AND CALCIUM
Calcium, Total (PTH): 8.2 mg/dL — ABNORMAL LOW (ref 8.4–10.5)
PTH: 90.7 pg/mL — ABNORMAL HIGH (ref 14.0–72.0)

## 2013-10-10 LAB — BASIC METABOLIC PANEL
BUN: 63 mg/dL — ABNORMAL HIGH (ref 6–23)
CO2: 26 mEq/L (ref 19–32)
CREATININE: 3.72 mg/dL — AB (ref 0.50–1.35)
Calcium: 8.5 mg/dL (ref 8.4–10.5)
Chloride: 107 mEq/L (ref 96–112)
GFR calc Af Amer: 17 mL/min — ABNORMAL LOW (ref >90)
GFR calc non Af Amer: 15 mL/min — ABNORMAL LOW (ref >90)
GLUCOSE: 63 mg/dL — AB (ref 70–99)
POTASSIUM: 4.4 meq/L (ref 3.7–5.3)
Sodium: 143 mEq/L (ref 137–147)

## 2013-10-10 LAB — HEMOGLOBIN A1C
HEMOGLOBIN A1C: 8.6 % — AB (ref <5.7)
Mean Plasma Glucose: 200 mg/dL — ABNORMAL HIGH (ref <117)

## 2013-10-10 LAB — GLUCOSE, CAPILLARY
Glucose-Capillary: 60 mg/dL — ABNORMAL LOW (ref 70–99)
Glucose-Capillary: 102 mg/dL — ABNORMAL HIGH (ref 70–99)
Glucose-Capillary: 256 mg/dL — ABNORMAL HIGH (ref 70–99)
Glucose-Capillary: 272 mg/dL — ABNORMAL HIGH (ref 70–99)
Glucose-Capillary: 168 mg/dL — ABNORMAL HIGH (ref 70–99)

## 2013-10-10 MED ORDER — INSULIN DETEMIR 100 UNIT/ML ~~LOC~~ SOLN
20.0000 [IU] | Freq: Every day | SUBCUTANEOUS | Status: DC
  Administered 2013-10-10 – 2013-10-11 (×2): 20 [IU] via SUBCUTANEOUS
  Filled 2013-10-10 (×2): qty 0.2

## 2013-10-10 MED ORDER — GLUCOSE 40 % PO GEL
ORAL | Status: AC
  Filled 2013-10-10: qty 1

## 2013-10-10 NOTE — Progress Notes (Signed)
Spoke with patient about diabetes and outpatient regimen for diabetes control.  Patient reports that he was diagnosed with Type 1 DM at the age of 73 years old.  Currently he reports that he takes Levemir 41 units QAM as an outpatient for Diabetes.  He states that he also has Novolin N and Novolin R at home that he "may use from time to time if his blood sugar gets out of balance".  He states that generally his blood sugar runs from 80-200's mg/dl.  He states that his last A1C was up to 7.8%.  His PCP (Dr. Olena Heckle) manages his diabetes and he states that he does not have an endocrinologist.  In discussing patient's DM regimen, he reported that when he "went into the doughnut hole" with Medicare he was having a very hard time affording his insulin pens which were over $400 out of pocket.  Informed patient about the Patient Assistance program with Novo for Levemir that could be accessed by the internet.  Patient's wife reported they do not have a computer and did not know how to use one so information for the Novo patient assistance program was printed out and given to his wife.  Patient also reported concern about his hospital bills and reports that he owes over $60,000 to [facility] for medical care received last year.  Inquired if patient has applied for state assistance and he reports that he has applied and been turned down.  Informed patient I would have Madelaine Etienne (financial counselor) give him a call to discuss any options for assistance. Also discussed with Amy, RN, Case Management.    Thanks, Barnie Alderman, RN, MSN, CCRN Diabetes Coordinator Inpatient Diabetes Program [phone] (Team Pager) [phone] ([place] office) [phone] [facility] office)

## 2013-10-10 NOTE — Progress Notes (Signed)
Utilization Review Complete  

## 2013-10-10 NOTE — Evaluation (Signed)
Physical Therapy Evaluation Patient Details Name: Chris [name] MRN: [id number] DOB: Sep 20, 1941 Today's Date: 10/10/2013 Time: 1201-1228 PT Time Calculation (min): 27 min  PT Assessment / Plan / Recommendation History of Present Illness  Pt is admitted with renal failure.  He was recently at [facility] and underwent thoracentesis.  He lives with his wife and is fairly independent with ADLs and a cane but wife reports that he is very sedentary.  Clinical Impression   Pt was seen for evaluation and found to be close to prior functional level.  He uses a cane for gait but because of decreased standing balance, he might be safer using a walker.  Pt is very ambivalent about this and is determined that he wants a walker with wheels that swivel--this would be a Rollator.  This type of walker may not provide the stability that he would need, but it would be worth a try.  I am not sure that we have one to practice with.  I will find out.  He would benefit from OP PT but he is only agreeable if there is no co-pay.    PT Assessment  Patient needs continued PT services    Follow Up Recommendations  Outpatient PT (pt states he cannot afford co-pays)    Does the patient have the potential to tolerate intense rehabilitation      Barriers to Discharge        Equipment Recommendations   (may need a rollator)    Recommendations for Other Services     Frequency Min 3X/week    Precautions / Restrictions Precautions Precautions: None Restrictions Weight Bearing Restrictions: No   Pertinent Vitals/Pain       Mobility  Bed Mobility Overal bed mobility: Independent Transfers Overall transfer level: Modified independent Equipment used: None Ambulation/Gait Ambulation/Gait assistance: Supervision Ambulation Distance (Feet): 150 Feet Assistive device: Straight cane Gait Pattern/deviations: WFL(Within Functional Limits) (once he estabilshes gait rhythm)    Exercises     PT Diagnosis: Difficulty  walking  PT Problem List: Decreased balance;Decreased mobility PT Treatment Interventions: Gait training;Stair training;Balance training (continue to eval for need of walker v.s. cane)     PT Goals(Current goals can be found in the care plan section) Acute Rehab PT Goals Patient Stated Goal: none stated PT Goal Formulation: With patient/family Time For Goal Achievement: 10/24/13 Potential to Achieve Goals: Good  Visit Information  Last PT Received On: 10/10/13 History of Present Illness: Pt is admitted with renal failure.  He was recently at [facility] and underwent thoracentesis.  He lives with his wife and is fairly independent with ADLs and a cane but wife reports that he is very sedentary.       Prior [place] expects to be discharged to:: Private residence Living Arrangements: Spouse/significant other Available Help at Discharge: Family;Available 24 hours/day Type of Home: House Home Access: Stairs to enter [employer] of Steps: 3 Entrance Stairs-Rails: Right Home Layout: One level Home Equipment: Walker - 2 wheels;Cane - quad Additional Comments: Pt states that the wheels on his walker won't turn well and frequently stick causing an unsafe situation. Prior Function Level of Independence: Independent with assistive device(s) Communication Communication: No difficulties    Cognition  Cognition Arousal/Alertness: Awake/alert Behavior During Therapy: WFL for tasks assessed/performed Overall Cognitive Status: Within Functional Limits for tasks assessed    Extremity/Trunk Assessment Lower Extremity Assessment Lower Extremity Assessment: Overall WFL for tasks assessed (appears to have poor LE circulation) Cervical / Trunk Assessment Cervical /  Trunk Assessment: Normal   Balance Balance Overall balance assessment: Needs assistance Standing balance support: No upper extremity supported Standing balance-Leahy Scale: Fair Standing balance  comment: pt employs a wide based stance to maintain balance...does best with his shoes on  End of Session PT - End of Session Equipment Utilized During Treatment: Gait belt Activity Tolerance: Patient tolerated treatment well Patient left: in chair;with call bell/phone within reach;with family/visitor present Nurse Communication: Mobility status  GP     Sable Feil 10/10/2013, 12:32 PM

## 2013-10-10 NOTE — Progress Notes (Signed)
Patient's CBG 60 this am,he refused the oral glucose gel at this time,whoever he did eat graham crackers and he also had orange juice,will continue to monitor patient,MD notified.

## 2013-10-10 NOTE — Progress Notes (Signed)
[facility] PROGRESS NOTE  Chris [name] OFB:[id number] DOB: 10-26-1940 DOA: 10/07/2013 PCP: Gar Ponto, MD  Assessment/Plan: 73 year old male with history of persistent right pleural effusion, CKD, diabetes mellitus, hypertension, a fib who was just discharged today from [facility], came back to the ED with inability to pass urine for one day. Patient was seen by CT surgery and recommended to undergo VATS procedure, but patient refused to undergo VATS and instead underwent ultrasound-guided thoracentesis on 1/15. Post procedure patient was found to be hypotensive but as patient's blood pressure improved he was discharged home. As per patient he has not been able to urinate since yesterday, in the ED today patient's labs reveal acute kidney injury with creatinine of 4.97 which is up from his baseline 2.05 as of 1/12.  1. AKI on CKD III likely multifactorial prerenal vs cardiorenal, ATN - improving; defer to nephrology; appreciate nephrology management   2. Diabetes mellitus; last HA1C-8.1; episode of hypoglycemia likely due to AKI/ckd -reduce lantus to 20+ISS; recheck HAcont insulin regimen ha1c  3. Recurrent R pleural effusion;s/p thoracentesis right pleural effusion; refused VATS procedure -cont prn bronchodilators; Vicodin for pain control   4. A fib; patient is on ASA; he refused anticoagulation; I d/w patient again he is aware of risk of CVA, refused anticoagulation; cont BB  5. Chronic abdominal pain; Patient complained of abdominal pain at [facility] and underwent CT scan of the abdomen which was unremarkable; Patient denies abdominal pain at this time  - continue to monitor    Code status: patient is DO NOT RESUSCITATE   Code Status: DNR Family Communication: d/w patient (indicate person spoken with, relationship, and if by phone, the number) Disposition Plan: home when stable    Consultants:  Nephrology   Procedures:  None   Antibiotics:  None   (indicate start date, and stop date if known)  HPI/Subjective: alert  Objective: Filed Vitals:   10/10/13 1015  BP: 150/64  Pulse: 97  Temp:   Resp:     Intake/Output Summary (Last 24 hours) at 10/10/13 1110 Last data filed at 10/10/13 1028  Gross per 24 hour  Intake 3398.25 ml  Output   3450 ml  Net -51.75 ml   Filed Weights   10/07/13 1953 10/07/13 2335 10/09/13 0500  Weight: 92.08 kg (203 lb) 96.7 kg (213 lb 3 oz) 98.8 kg (217 lb 13 oz)    Exam:   General:  alert  Cardiovascular: s1,s2 irregular   Respiratory: R lung decreased AE  Abdomen: soft, nt, nd  Musculoskeletal: no edema   Data Reviewed: Basic Metabolic Panel:  Recent Labs Lab 10/04/13 0022 10/07/13 2029 10/08/13 0554 10/09/13 0557 10/10/13 0615  NA 137 137 139 140 143  K 4.9 4.4 4.3 4.1 4.4  CL 96 94* 100 103 107  CO2 28 24 24 24 26   GLUCOSE 231* 310* 150* 119* 63*  BUN 52* 73* 73* 74* 63*  CREATININE 2.05* 4.97* 5.12* 4.43* 3.72*  CALCIUM 9.5 9.2 8.5 8.3* 8.5  PHOS  --   --   --  4.3  --    Liver Function Tests:  Recent Labs Lab 10/04/13 0022 10/07/13 2029 10/08/13 0554  AST 28 32 27  ALT 20 19 15   ALKPHOS 81 78 63  BILITOT 1.0 1.0 0.7  PROT 8.5* 7.9 6.4  ALBUMIN 3.9 3.7 3.0*    Recent Labs Lab 10/04/13 0022  LIPASE 30   No results found for this basename: AMMONIA,  in the last  168 hours CBC:  Recent Labs Lab 10/04/13 0022 10/07/13 2029 10/08/13 0554 10/09/13 0557  WBC 7.8 15.3* 10.8* 6.5  NEUTROABS 5.3 11.8*  --   --   HGB 12.3* 12.3* 11.0* 11.1*  HCT 34.4* 35.3* 33.0* 31.9*  MCV 98.3 100.3* 100.6* 100.3*  PLT 222 250 214 184   Cardiac Enzymes: No results found for this basename: CKTOTAL, CKMB, CKMBINDEX, TROPONINI,  in the last 168 hours BNP (last 3 results)  Recent Labs  08/03/13 1347  PROBNP 1834.0*   CBG:  Recent Labs Lab 10/09/13 1157 10/09/13 1724 10/09/13 2142 10/10/13 0714 10/10/13 0750  GLUCAP 252* 142* 166* 60* 102*    No  results found for this or any previous visit (from the past 240 hour(s)).   Studies: No results found.  Scheduled Meds: . aspirin EC  81 mg Oral Daily  . dextrose      . furosemide  80 mg Intravenous BID  . heparin  5,000 Units Subcutaneous Q8H  . insulin aspart  0-9 Units Subcutaneous TID WC  . insulin detemir  40 Units Subcutaneous Daily  . metoprolol tartrate  25 mg Oral BID  . simvastatin  20 mg Oral q1800   Continuous Infusions: . sodium chloride 135 mL/hr at 10/10/13 0301    Principal Problem:   Acute renal failure Active Problems:   HTN (hypertension)   DM (diabetes mellitus)   Pleural effusion   AKI (acute kidney injury)    Time spent: >35 minutes     Kinnie Feil  [facility] Pager [phone]. If 7PM-7AM, please contact night-coverage at [email or web address], password [facility] 10/10/2013, 11:10 AM  LOS: 3 days

## 2013-10-10 NOTE — Progress Notes (Signed)
Subjective: Interval History: has no complaint of no nausea or vomiting. Patient is still that he's feeling better. He denies any difficulty breathing.   Objective: Vital signs in last 24 hours: Temp:  [98.1 F (36.7 C)-98.7 F (37.1 C)] 98.1 F (36.7 C) (01/19 0457) Pulse Rate:  [82-102] 82 (01/19 0457) Resp:  [18-20] 20 (01/19 0457) BP: (132-155)/(51-80) 132/80 mmHg (01/19 0457) SpO2:  [95 %-97 %] 97 % (01/19 0457) Weight change:   Intake/Output from previous day: 01/18 0701 - 01/19 0700 In: 3638.3 [P.O.:720; I.V.:2918.3] Out: 2875 [Urine:2875] Intake/Output this shift:    Generally he is alert and apparent distress. HEENT exam no conjunctival pallor and icterus. Chest decreased breath sound right greater than left. His heart exam revealed regular rate and rhythm no murmur. Extremities no edema.  Lab Results:  Recent Labs  10/08/13 0554 10/09/13 0557  WBC 10.8* 6.5  HGB 11.0* 11.1*  HCT 33.0* 31.9*  PLT 214 184   BMET:   Recent Labs  10/09/13 0557 10/10/13 0615  NA 140 143  K 4.1 4.4  CL 103 107  CO2 24 26  GLUCOSE 119* 63*  BUN 74* 63*  CREATININE 4.43* 3.72*  CALCIUM 8.3* 8.5   No results found for this basename: PTH,  in the last 72 hours Iron Studies: No results found for this basename: IRON, TIBC, TRANSFERRIN, FERRITIN,  in the last 72 hours  Studies/Results: US Renal  10/08/2013   CLINICAL DATA:  Acute renal insufficiency, history of diabetes and hypertension.  EXAM: RENAL/URINARY TRACT ULTRASOUND COMPLETE  COMPARISON:  None.  FINDINGS: Right Kidney:  Length: 10.5 cm. The renal cortical echotexture is increased and is approximately equal to that of the adjacent liver. There is mild cortical thinning diffusely. There is no focal mass nor ductal dilation. There is no hydronephrosis.  Left Kidney:  Length: 11.4 cm. The cortical echotexture on the left is subjectively mildly increased. There is mild cortical thinning. There is no focal renal mass nor  evidence of hydronephrosis on the left.  Bladder:  The urinary bladder is decompressed.  A Foley catheter is present.  IMPRESSION: 1. Neither kidney exhibits evidence of obstruction. 2. There is mild cortical atrophy. The echotexture of the renal cortex bilaterally is increased suggesting medical renal disease.   Electronically Signed   By: David  Martinique   On: 10/08/2013 10:55    I have reviewed the patient's current medications.  Assessment/Plan: Problem #1 acute kidney injury superimposed on chronic his BUN and creatinine presently is improving but remained above his base line Problem #2 history of CHF: Presently patient is none oliguric. He has 2875 cc of urine and no sign of fluid overload. Problem #3 hypertension his blood pressure seems reasonably controlled Problem #4 metabolic bone disease his calcium and phosphorus isn't range. Problem #5 history of a trial fibrillation: His heart rate is controlled Problem #6 history of CVA Problem #7 history of diabetes Problem #8 pleural effusion: He status post thoracentesis. Patient is still seems to have moderate right-sided pleural effusion. Plan: We'll continue his present management Check his basic metabolic panel in the morning.    LOS: 3 days   Serena Petterson S 10/10/2013,7:22 AM

## 2013-10-11 ENCOUNTER — Ambulatory Visit: Payer: Medicare Other | Admitting: Thoracic Surgery (Cardiothoracic Vascular Surgery)

## 2013-10-11 DIAGNOSIS — I10 Essential (primary) hypertension: Secondary | ICD-10-CM

## 2013-10-11 DIAGNOSIS — I509 Heart failure, unspecified: Secondary | ICD-10-CM

## 2013-10-11 DIAGNOSIS — K5900 Constipation, unspecified: Secondary | ICD-10-CM

## 2013-10-11 LAB — BASIC METABOLIC PANEL
BUN: 58 mg/dL — AB (ref 6–23)
CHLORIDE: 104 meq/L (ref 96–112)
CO2: 29 mEq/L (ref 19–32)
CREATININE: 3.46 mg/dL — AB (ref 0.50–1.35)
Calcium: 8.6 mg/dL (ref 8.4–10.5)
GFR calc non Af Amer: 16 mL/min — ABNORMAL LOW (ref >90)
GFR, EST AFRICAN AMERICAN: 19 mL/min — AB (ref >90)
Glucose, Bld: 173 mg/dL — ABNORMAL HIGH (ref 70–99)
Potassium: 4.2 mEq/L (ref 3.7–5.3)
Sodium: 143 mEq/L (ref 137–147)

## 2013-10-11 LAB — GLUCOSE, CAPILLARY
GLUCOSE-CAPILLARY: 163 mg/dL — AB (ref 70–99)
Glucose-Capillary: 160 mg/dL — ABNORMAL HIGH (ref 70–99)

## 2013-10-11 LAB — PHOSPHORUS: PHOSPHORUS: 3.5 mg/dL (ref 2.3–4.6)

## 2013-10-11 MED ORDER — FUROSEMIDE 40 MG PO TABS: 20.0000 mg | ORAL_TABLET | Freq: Two times a day (BID) | ORAL | Status: DC

## 2013-10-11 MED ORDER — MIRTAZAPINE 7.5 MG PO TABS: 7.5000 mg | ORAL_TABLET | Freq: Every day | ORAL | Status: DC

## 2013-10-11 NOTE — Progress Notes (Signed)
Physical Therapy Treatment Patient Details Name: PLATON AROCHO MRN: [id number] DOB: June 07, 1941 Today's Date: 10/11/2013 Time: 0926-1003 PT Time Calculation (min): 37 min  PT Assessment / Plan / Recommendation  History of Present Illness Pt states that he has stopped eating and is drinking only minimally...he states that the food makes him feel bloated.  Therefore, he feels very weak.  He also reports feeling rather hopeless about the future and feels that he is in "everyone's way".   PT Comments   Pt appears to be very depressed.  He is anxious to get home.  His overall standing stability is improved and in my opinion, he is more stable in gait with a cane.  A rollator was obtained and he was instructed in it's use.  I did not feel that it added to his stability.  Pt did not offer a clear opinion as to how it made him feel while walking.  My sense is that he will refuse all PT intervention due to concerns about the costs.  We will plan to work with him daily while in house, concentrating on balance. But because of his peripheral neuropathy, balance will be compromised at baseline.  He will also need to resume eating in order to maintain his strength.  Follow Up Recommendations        Does the patient have the potential to tolerate intense rehabilitation     Barriers to Discharge        Equipment Recommendations   (probably will not need a rollator)    Recommendations for Other Services    Frequency     Progress towards PT Goals Progress towards PT goals: Progressing toward goals  Plan Current plan remains appropriate;Equipment recommendations need to be updated    Precautions / Restrictions     Pertinent Vitals/Pain     Mobility       Exercises General Exercises - Lower Extremity Hip ABduction/ADduction: AROM;Both;Other reps (comment);Standing (sidesteps x 12', 2 laps) Mini-Sqauts: AROM;10 reps;Standing   PT Diagnosis:    PT Problem List:   PT Treatment Interventions:      PT Goals (current goals can now be found in the care plan section)    Visit Information  Last PT Received On: 10/11/13 History of Present Illness: Pt states that he has stopped eating and is drinking only minimally...he states that the food makes him feel bloated.  Therefore, he feels very weak.  He also reports feeling rather hopeless about the future and feels that he is in "everyone's way".    Subjective Data      Cognition  Cognition Arousal/Alertness: Awake/alert Behavior During Therapy: WFL for tasks assessed/performed Overall Cognitive Status: Within Functional Limits for tasks assessed    Balance     End of Session PT - End of Session Equipment Utilized During Treatment: Gait belt Activity Tolerance: Patient limited by fatigue Patient left: in bed;with call bell/phone within reach   GP     Demetrios Isaacs L 10/11/2013, 10:11 AM

## 2013-10-11 NOTE — Progress Notes (Signed)
D/c instructions reviewed with patient and wife.  Verbalized understanding.  Pt dc'd to home with wife. Schonewitz, Eulis Canner 10/11/2013

## 2013-10-11 NOTE — Progress Notes (Signed)
Chris [name]  MRN: [id number]  DOB/AGE: 06-18-1941 73 y.o.  Primary Care Physician:DANIEL, TERRY, MD  Admit date: 10/07/2013  Chief Complaint:  Chief Complaint  Patient presents with  . Urinary Retention    S-Pt presented on  10/07/2013 with  Chief Complaint  Patient presents with  . Urinary Retention  .  Pt offers no  Complaints.  But on pushing by the physical therapist pt did complain that he does not like the food in the hospital.   Meds . aspirin EC  81 mg Oral Daily  . furosemide  80 mg Intravenous BID  . heparin  5,000 Units Subcutaneous Q8H  . insulin aspart  0-9 Units Subcutaneous TID WC  . insulin detemir  20 Units Subcutaneous Daily  . metoprolol tartrate  25 mg Oral BID  . simvastatin  20 mg Oral q1800     Physical Exam: Vital signs in last 24 hours: Temp:  [98.2 F (36.8 C)-98.6 F (37 C)] 98.6 F (37 C) (01/20 0607) Pulse Rate:  [92-97] 92 (01/20 0607) Resp:  [20] 20 (01/20 0607) BP: (140-153)/(64-86) 140/86 mmHg (01/20 0607) SpO2:  [96 %-99 %] 96 % (01/20 0607) Weight change:  Last BM Date: 10/09/13  Intake/Output from previous day: 01/19 0701 - 01/20 0700 In: 2220.3 [P.O.:598; I.V.:1622.3] Out: 2600 [Urine:2600]     Physical Exam: General- pt is awake,alert, oriented to time place and person Resp- No acute REsp distress, Decreased bs on right base. CVS- S1S2 regular in rate and rhythm GIT- BS+, soft, NT, ND EXT- NO LE Edema, Cyanosis   Lab Results: CBC  Recent Labs  10/09/13 0557  WBC 6.5  HGB 11.1*  HCT 31.9*  PLT 184    BMET  Recent Labs  10/10/13 0615 10/11/13 0500  NA 143 143  K 4.4 4.2  CL 107 104  CO2 26 29  GLUCOSE 63* 173*  BUN 63* 58*  CREATININE 3.72* 3.46*  CALCIUM 8.5 8.6   Trend Creat 2015  5.12==>3.72=>3.46 2014  1.7--2.3 2010   1.9--2.6   Lab Results  Component Value Date   PTH 90.7* 10/08/2013   CALCIUM 8.6 10/11/2013   PHOS 3.5 10/11/2013       Impression: 1)Renal  AKI secondary to  Prerenal/ATN/Cardiorenal/Post renal                AKI on CKD                AKI now improving               Creat now trending dwon               CKD stage 3 .               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   2)HTN Target Organ damage  CKD CVA CHF  Medication- On Diuretics- On Beta blockers   3)Anemia HGb at goal (9--11) GI work up Colonoscopy  4)CKD Mineral-Bone Disorder PTH acceptable. Secondary Hyperparathyroidism  Present. Phosphorus at goal.   5)CHF- admitted with Fluid overload PMD following  6)Electrolytes Normokalemic NOrmonatremic   7)Acid base Co2  at goal  8) REsp Pleural effusion S/p tap- now clinically better.    Plan:  Will continue current tx.      Tabb Croghan S 10/11/2013, 9:19 AM

## 2013-10-11 NOTE — Care Management Note (Signed)
    Page 1 of 1   10/11/2013     1:12:42 PM   CARE MANAGEMENT NOTE 10/11/2013  Patient:  DERIC, BOCOCK   Account Number:  [id number]  Date Initiated:  10/11/2013  Documentation initiated by:  Claretha Cooper  Subjective/Objective Assessment:   Pt from home with wife and will return home. both pt and wife agree that [facility] PT would be a benefit to him. Had [employer] before and would like her again.     Action/Plan:   Anticipated DC Date:  10/11/2013   Anticipated DC Plan:  [place]  CM consult      Choice offered to / List presented to:          [facility] arranged  HH-2 PT      Status of service:  Completed, signed off Medicare Important Message given?  YES (If response is "NO", the following Medicare IM given date fields will be blank) Date Medicare IM given:  10/11/2013 Date Additional Medicare IM given:    Discharge Disposition:  [place]  Per UR Regulation:    If discussed at Long Length of Stay Meetings, dates discussed:    Comments:  10/11/13 Claretha Cooper RN BSN CM

## 2013-10-11 NOTE — Progress Notes (Signed)
Inpatient Diabetes Program Recommendations  AACE/ADA: New Consensus Statement on Inpatient Glycemic Control (2013)  Target Ranges:  Prepandial:   less than 140 mg/dL      Peak postprandial:   less than 180 mg/dL (1-2 hours)      Critically ill patients:  140 - 180 mg/dL   Results for DERL, ABALOS (MRN [id number]) as of 10/11/2013 10:22  Ref. Range 10/10/2013 07:14 10/10/2013 07:50 10/10/2013 11:15 10/10/2013 16:41 10/10/2013 21:53 10/11/2013 08:05  Glucose-Capillary Latest Range: 70-99 mg/dL 60 (L) 102 (H) 256 (H) 272 (H) 168 (H) 163 (H)    Inpatient Diabetes Program Recommendations Insulin - Meal Coverage: Please consider ordering Novolog 3 units TID with meals for meal coverage as long as patient eats at least 50% of meal.  Note: Fasting blood glucose on 1/19 was 60 mg/dl and Levemir was decreased to 20 units daily on 10/10/13.  Fasting is improved and within target goals this morning with reduced basal insulin dose.  Noted post-prandial glucose consistently elevated.  Please consider ordering Novolog meal coverage.  Will continue to follow.  Thanks, Barnie Alderman, RN, MSN, CCRN Diabetes Coordinator Inpatient Diabetes Program [phone] (Team Pager) [phone] (AP office) [phone] [facility] office)

## 2013-10-11 NOTE — Discharge Summary (Signed)
Physician Discharge Summary  Chris [name] [id number] DOB: 05-Mar-1941 DOA: 10/07/2013  PCP: Gar Ponto, MD  Admit date: 10/07/2013 Discharge date: 10/11/2013  Time spent: >35 minutes  Recommendations for Outpatient Follow-up:  F/u with nephrologist in 1 week F/u with PCP in 1-2 weeks as needed Discharge Diagnoses:  Principal Problem:   Acute renal failure Active Problems:   HTN (hypertension)   DM (diabetes mellitus)   Pleural effusion   AKI (acute kidney injury)   Discharge Condition: stable   Diet recommendation: DM  Filed Weights   10/07/13 1953 10/07/13 2335 10/09/13 0500  Weight: 92.08 kg (203 lb) 96.7 kg (213 lb 3 oz) 98.8 kg (217 lb 13 oz)    History of present illness:  73 year old male with history of persistent right pleural effusion, CKD, diabetes mellitus, hypertension, a fib who was just discharged today from [facility], came back to the ED with inability to pass urine for one day. Patient was seen by CT surgery and recommended to undergo VATS procedure, but patient refused to undergo VATS and instead underwent ultrasound-guided thoracentesis on 1/15. Post procedure patient was found to be hypotensive but as patient's blood pressure improved he was discharged home. As per patient he has not been able to urinate since yesterday, in the ED today patient's labs reveal acute kidney injury with creatinine of 4.97 which is up from his baseline 2.05 as of 1/12.    Hospital Course:  1. AKI on CKD III likely multifactorial prerenal vs cardiorenal, ATN; diuretics+ACE - improving; start low dose diuretics on 1/22; BVMP next week; nephrology follow up appointment requested  2. Diabetes mellitus; last HA1C-8.1; cont insulin regimen   3. Recurrent R pleural effusion;s/p thoracentesis right pleural effusion; refused VATS procedure  -cont prn bronchodilators; Vicodin for pain control; gentle diuresis  4. A fib; patient is on ASA; he refused anticoagulation; I d/w  patient again he is aware of risk of CVA, refused anticoagulation; cont BB  5. Chronic abdominal pain; Patient complained of abdominal pain at [place] and underwent CT scan of the abdomen which was unremarkable; Patient denies abdominal pain at this time  - continue to monitor  6. Depression, no suicidal ideation or plans; d/wpatient, his wife, will start [place] OP follow up   Code status: patient is DO NOT RESUSCITATE    Procedures:  none (i.e. Studies not automatically included, echos, thoracentesis, etc; not x-rays)  Consultations:  Nephrology   Discharge Exam: Filed Vitals:   10/11/13 0607  BP: 140/86  Pulse: 92  Temp: 98.6 F (37 C)  Resp: 20    General: alert Cardiovascular: s1,s2 rrr Respiratory: CTA BL  Discharge Instructions  Discharge Orders   Future Orders Complete By Expires   Diet - low sodium heart healthy  As directed    Discharge instructions  As directed    Comments:     Please follow up with nephrologist in 1 week   Increase activity slowly  As directed        Medication List    STOP taking these medications       lisinopril 20 MG tablet  Commonly known as:  PRINIVIL,ZESTRIL      TAKE these medications       aspirin EC 81 MG tablet  Take 81 mg by mouth daily.     furosemide 40 MG tablet  Commonly known as:  LASIX  Take 0.5 tablets (20 mg total) by mouth 2 (two) times daily.  Start taking on:  10/13/2013     Insulin Detemir 100 UNIT/ML Pen  Commonly known as:  LEVEMIR FLEXPEN  Inject 40 Units into the skin daily.     LORazepam 0.5 MG tablet  Commonly known as:  ATIVAN  Take 0.5 mg by mouth 2 (two) times daily as needed for anxiety.     lovastatin 40 MG tablet  Commonly known as:  MEVACOR  Take 40 mg by mouth at bedtime.     lubiprostone 24 MCG capsule  Commonly known as:  AMITIZA  Take 24 mcg by mouth daily as needed for constipation.     Magnesium 250 MG Tabs  Take 250 mg by mouth daily. OTC     metoprolol 50 MG  tablet  Commonly known as:  LOPRESSOR  Take 50 mg by mouth 2 (two) times daily.     mirtazapine 7.5 MG tablet  Commonly known as:  REMERON  Take 1 tablet (7.5 mg total) by mouth at bedtime.     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     Vitamin D-3 5000 UNITS Tabs  Take 5,000 Units by mouth daily.       Allergies  Allergen Reactions  . Ciprofloxacin Other (See Comments)    Sores all on legs.   . Finasteride     Affected  Patients testicles  . Metoclopramide     Jerking all over  . Xanax [Alprazolam] Other (See Comments)    Not in right state of mind.        Follow-up Information   Follow up with Gar Ponto, MD In 1 week.   Specialty:  Family Medicine   Contact information:   [address] [place] [address] [phone]       Follow up with [facility], MD. Schedule an appointment as soon as possible for a visit in 1 week.   Specialty:  Nephrology   Contact information:   [address] [place] [address] [phone]        The results of significant diagnostics from this hospitalization (including imaging, microbiology, ancillary and laboratory) are listed below for reference.    Significant Diagnostic Studies: Ct Abdomen Pelvis Wo Contrast  10/05/2013   CLINICAL DATA:  Abdominal pain.  EXAM: CT ABDOMEN AND PELVIS WITHOUT CONTRAST  TECHNIQUE: Multidetector CT imaging of the abdomen and pelvis was performed following the standard protocol without intravenous contrast.  COMPARISON:  08/06/2013  FINDINGS: LOWER CHEST: Coronary artery atherosclerosis. Large, water density right pleural effusion.  ABDOMEN/PELVIS:  Liver: No focal abnormality.  Biliary: Cholecystectomy.  Pancreas: Diffuse atrophy, without ductal dilatation.  Spleen: Unremarkable.  Adrenals: Unremarkable.  Kidneys and ureters: Bilateral renal  cortical thinning. No hydronephrosis or nephrolithiasis.  Bladder: Circumferential bladder wall thickening, likely from chronic outlet obstruction.  .  Reproductive: Enlarged prostate, measuring 6 cm in transverse span and deforming the bladder base.  Bowel: No bowel obstruction. Thickening of the posteromedial wall of the mid duodenum is stable dating back to 2009, likely redundant folds. No pericecal inflammation.  Retroperitoneum: No mass or adenopathy.  Peritoneum: No free fluid or gas.  Vascular: Diffuse arterial calcification.  OSSEOUS: Remote L1 superior endplate fracture. No acute osseous findings.  IMPRESSION: 1. Stable exam.  No acute intra-abdominal findings. 2. Large right pleural effusion.   Electronically Signed   By: Jorje Guild M.D.   On:  10/05/2013 22:11   Dg Chest 1 View  10/06/2013   CLINICAL DATA:  Status post right thoracentesis  EXAM: CHEST - 1 VIEW  COMPARISON:  10/04/2013  FINDINGS: There is no evidence of a pneumothorax status post thoracentesis. Decreased consolidative density within the right lung base is appreciated consistent with drainage of the patient's pleural effusion. Residual ill-defined density projects within the lung base representing either atelectasis or possibly diffuse infiltrate. There is blunting of the right costophrenic angle tracking along the lateral right hemi thorax. Residual effusion with loculation is of diagnostic consideration. . Cardiac silhouette is enlarged. It status post median sternotomy coronary artery bypass grafting. Degenerative changes identified within the right lower shoulders.  IMPRESSION: 1. No evidence for pneumothorax status post thoracentesis 2. Atelectasis versus infiltrate within the right lung base. 3. Small residual effusion possibly a component of loculation along the right hemithorax.   Electronically Signed   By: Salome Holmes M.D.   On: 10/06/2013 15:51   Dg Chest 2 View  10/07/2013   CLINICAL DATA:  Thoracentesis yesterday.   Complaining of weakness.  EXAM: CHEST  2 VIEW  COMPARISON:  Chest x-ray 10/06/2013.  FINDINGS: Persistent moderate right-sided pleural effusion, likely partially loculated. The opacities throughout the base of the right lung favored to predominantly reflect some passive atelectasis, although some underlying airspace consolidation is not excluded. Left lung is clear. No left pleural effusion. No pneumothorax. Pulmonary vasculature is normal. Heart size is mildly enlarged. Upper mediastinal contours are within normal limits. Atherosclerosis in the thoracic aorta. Status post median sternotomy.  IMPRESSION: 1. Allowing for slight differences in patient positioning, the radiographic appearance the chest is unchanged, as above. Specifically, no pneumothorax identified.   Electronically Signed   By: Trudie Reed M.D.   On: 10/07/2013 21:28   Dg Chest 2 View  10/04/2013   CLINICAL DATA:  Shortness of breath. Prior CABG. Current history of ischemic cardiomyopathy, atrial fibrillation, diabetes, and hypertension.  EXAM: CHEST  2 VIEW  COMPARISON:  08/25/2013, 08/15/2013.  CT chest 08/06/2013.  FINDINGS: Recurrent large right pleural effusion and associated dense passive atelectasis in the right middle lobe and right lower lobe. No left pleural effusion. Left lung clear. Pulmonary vascularity normal. No pneumothorax.  Prior sternotomy for CABG. Cardiac silhouette moderately enlarged but stable. Thoracic aorta atherosclerotic, unchanged. Degenerative changes involving the thoracic spine.  IMPRESSION: 1. Recurrent large right pleural effusion with associated dense passive atelectasis in the right lower lobe and right middle lobe. 2. Stable moderate cardiomegaly without pulmonary edema.   Electronically Signed   By: Hulan Saas M.D.   On: 10/04/2013 01:12   US Renal  10/08/2013   CLINICAL DATA:  Acute renal insufficiency, history of diabetes and hypertension.  EXAM: RENAL/URINARY TRACT ULTRASOUND COMPLETE   COMPARISON:  None.  FINDINGS: Right Kidney:  Length: 10.5 cm. The renal cortical echotexture is increased and is approximately equal to that of the adjacent liver. There is mild cortical thinning diffusely. There is no focal mass nor ductal dilation. There is no hydronephrosis.  Left Kidney:  Length: 11.4 cm. The cortical echotexture on the left is subjectively mildly increased. There is mild cortical thinning. There is no focal renal mass nor evidence of hydronephrosis on the left.  Bladder:  The urinary bladder is decompressed.  A Foley catheter is present.  IMPRESSION: 1. Neither kidney exhibits evidence of obstruction. 2. There is mild cortical atrophy. The echotexture of the renal cortex bilaterally is increased suggesting medical renal disease.  Electronically Signed   By: David  Martinique   On: 10/08/2013 10:55   US Thoracentesis Asp Pleural Space W/img Guide  10/06/2013   CLINICAL DATA:  Coronary artery disease, prior CABG, dyspnea, recurrent large right pleural effusion. Request is made for diagnostic and therapeutic right thoracentesis.  EXAM: ULTRASOUND GUIDED DIAGNOSTIC AND THERAPEUTIC RIGHT THORACENTESIS  COMPARISON:  PRIOR THORACENTESIS ON 07/26/2013.  FINDINGS: A total of approximately 2 liters of yellow fluid was removed. A fluid sample wassent for laboratory analysis. Only the above amount of fluid was removed at this time secondary to patient coughing.  IMPRESSION: Successful ultrasound guided diagnostic and therapeutic right thoracentesis yielding 2 liters of pleural fluid.  Read by: Rowe Robert ,P.A.-C.  PROCEDURE: An ultrasound guided thoracentesis was thoroughly discussed with the patient and questions answered. The benefits, risks, alternatives and complications were also discussed. The patient understands and wishes to proceed with the procedure. Written consent was obtained.  Ultrasound was performed to localize and mark an adequate pocket of fluid in the right chest. The area was then  prepped and draped in the normal sterile fashion. 1% Lidocaine was used for local anesthesia. Under ultrasound guidance a 19 gauge Yueh catheter was introduced. Thoracentesis was performed. The catheter was removed and a dressing applied.  Complications:  None immediate   Electronically Signed   By: Maryclare Bean M.D.   On: 10/06/2013 15:02    Microbiology: No results found for this or any previous visit (from the past 240 hour(s)).   Labs: Basic Metabolic Panel:  Recent Labs Lab 10/07/13 2029 10/08/13 0554 10/08/13 1108 10/09/13 0557 10/10/13 0615 10/11/13 0500  NA 137 139  --  140 143 143  K 4.4 4.3  --  4.1 4.4 4.2  CL 94* 100  --  103 107 104  CO2 24 24  --  24 26 29   GLUCOSE 310* 150*  --  119* 63* 173*  BUN 73* 73*  --  74* 63* 58*  CREATININE 4.97* 5.12*  --  4.43* 3.72* 3.46*  CALCIUM 9.2 8.5 8.2* 8.3* 8.5 8.6  PHOS  --   --   --  4.3  --  3.5   Liver Function Tests:  Recent Labs Lab 10/07/13 2029 10/08/13 0554  AST 32 27  ALT 19 15  ALKPHOS 78 63  BILITOT 1.0 0.7  PROT 7.9 6.4  ALBUMIN 3.7 3.0*   No results found for this basename: LIPASE, AMYLASE,  in the last 168 hours No results found for this basename: AMMONIA,  in the last 168 hours CBC:  Recent Labs Lab 10/07/13 2029 10/08/13 0554 10/09/13 0557  WBC 15.3* 10.8* 6.5  NEUTROABS 11.8*  --   --   HGB 12.3* 11.0* 11.1*  HCT 35.3* 33.0* 31.9*  MCV 100.3* 100.6* 100.3*  PLT 250 214 184   Cardiac Enzymes: No results found for this basename: CKTOTAL, CKMB, CKMBINDEX, TROPONINI,  in the last 168 hours BNP: BNP (last 3 results)  Recent Labs  08/03/13 1347  PROBNP 1834.0*   CBG:  Recent Labs Lab 10/10/13 1115 10/10/13 1641 10/10/13 2153 10/11/13 0805 10/11/13 1158  GLUCAP 256* 272* 168* 163* 160*       Signed:  Yanky Vanderburg N  [facility] 10/11/2013, 12:58 PM

## 2013-10-24 ENCOUNTER — Other Ambulatory Visit: Payer: Self-pay | Admitting: *Deleted

## 2013-10-24 DIAGNOSIS — J90 Pleural effusion, not elsewhere classified: Secondary | ICD-10-CM

## 2013-10-25 ENCOUNTER — Other Ambulatory Visit: Payer: Self-pay | Admitting: *Deleted

## 2013-10-25 ENCOUNTER — Ambulatory Visit
Admission: RE | Admit: 2013-10-25 | Discharge: 2013-10-25 | Disposition: A | Discharge status: Home / Self Care | Payer: Medicare HMO | Source: Ambulatory Visit | Attending: Thoracic Surgery (Cardiothoracic Vascular Surgery) | Admitting: Thoracic Surgery (Cardiothoracic Vascular Surgery)

## 2013-10-25 ENCOUNTER — Encounter: Payer: Self-pay | Admitting: Thoracic Surgery (Cardiothoracic Vascular Surgery)

## 2013-10-25 ENCOUNTER — Ambulatory Visit (INDEPENDENT_AMBULATORY_CARE_PROVIDER_SITE_OTHER): Payer: Commercial Managed Care - HMO | Admitting: Thoracic Surgery (Cardiothoracic Vascular Surgery)

## 2013-10-25 VITALS — BP 156/78 | HR 82 | Resp 20 | Ht 74.0 in | Wt 217.0 lb

## 2013-10-25 DIAGNOSIS — J90 Pleural effusion, not elsewhere classified: Secondary | ICD-10-CM

## 2013-10-25 DIAGNOSIS — Z09 Encounter for follow-up examination after completed treatment for conditions other than malignant neoplasm: Secondary | ICD-10-CM

## 2013-10-25 NOTE — Progress Notes (Signed)
HPI:  [name] returns today for followup of his recurrent right pleural effusion. He is a 73 year old gentleman who has had difficulties with a right pleural effusion dating back to last fall. He had a thoracentesis in November which showed a transudate, however there were some atypical cells on cytology. The effusion recurred. I saw him in December and recommended that we do a right vats to drain the effusion and to obtain pleural biopsies. He refused at that time. We did another thoracentesis.   He missed his followup appointment, and ended up in the emergency room at [facility] with a recurrent effusion. He was transferred to [facility]. He again refused surgery during that admission. A repeat thoracentesis was done. He had some hypotension around the time of thoracentesis.  He was discharged from [facility] and then ended up in the emergency room at [facility] Hours later unable to void. He was admitted with acute on chronic renal failure. He was discharged a few days ago.  He says that he is not doing well. He continues to have abdominal complaints. He says that he is just guessing about his insulin dose, because neither he nor his wife can see well enough to draw it up accurately. He does not feel particularly short of breath, but is not engaging in any significant physical activity.  Past Medical History  Diagnosis Date  . 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 Diabetes mellitus   . Macrocytosis     normal B12 & folate levels  . Ischemic cardiomyopathy     EF of 45-50% - posterior & inferior walls are severly hypokinetic  . Atrial fibrillation     not candidate for chronic anticoagulation with Coumadin given chrronic epistaxis as well as hemorrhagic conversion of his acute cva   . Hyperlipidemia   . Hypertension   . Complication of anesthesia   . PONV (postoperative nausea and vomiting)   . Acute renal  insufficiency     resolved  . Myocardial infarct   . CHF (congestive heart failure)   . Shortness of breath   . Pleural effusion 10/05/2013      Current Outpatient Prescriptions  Medication Sig Dispense Refill  . aspirin EC 81 MG tablet Take 81 mg by mouth daily.      . Cholecalciferol (VITAMIN D-3) 5000 UNITS TABS Take 5,000 Units by mouth daily.       . furosemide (LASIX) 40 MG tablet Take 0.5 tablets (20 mg total) by mouth 2 (two) times daily.  30 tablet  1  . Insulin Detemir (LEVEMIR FLEXPEN) 100 UNIT/ML SOPN Inject 40 Units into the skin daily.  2 pen  2  . LORazepam (ATIVAN) 0.5 MG tablet Take 0.5 mg by mouth 2 (two) times daily as needed for anxiety.      . lovastatin (MEVACOR) 40 MG tablet Take 40 mg by mouth at bedtime.        Marland Kitchen lubiprostone (AMITIZA) 24 MCG capsule Take 24 mcg by mouth daily as needed for constipation.      . Magnesium 250 MG TABS Take 250 mg by mouth daily. OTC      . metoprolol (LOPRESSOR) 50 MG tablet Take 50 mg by mouth 2 (two) times daily.      . mirtazapine (REMERON) 7.5 MG tablet Take 1 tablet (7.5 mg total) by mouth at bedtime.  30 tablet  0  . 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       No current facility-administered medications for this visit.    Physical Exam BP 156/78  Pulse 82  Resp 20  Ht 6' 2" (1.88 m)  Wt 217 lb (98.431 kg)  BMI 27.85 kg/m2  SpO2 92% Elderly 72-year-old man in no acute distress Moves slowly, flat affect Oriented to person place and time No cervical or subclavicular adenopathy Cardiac irregular rate and rhythm Lungs absent breath sounds right base No peripheral edema  Diagnostic Tests: Chest x-ray 10/25/2013 CHEST 2 VIEW  COMPARISON: 10/07/2013  FINDINGS:  There is a large right pleural effusion which is increased in size  moderately when compared to the prior study. Stable mild cardiac   enlargement. Vascular pattern normal. Patient is status post CABG.  IMPRESSION:  Increase in the size of right pleural effusion. Right pleural  effusion is now large with considerable underlying passive  atelectasis.  Electronically Signed  By: Raymond Rubner M.D.  On: 10/25/2013 12:31  Impression: 72-year-old with a recurrent right pleural effusion. Not surprisingly this has recurred since his most recent thoracentesis a few weeks ago. I once again recommended to him that we proceed with right VATS to drain the effusion, possible decortication and possible Pleurx catheter placement. I discussed with the patient and his family (wife and one son) the indications, risks, benefits, and alternatives. They understand the high-risk nature of the procedure and [name] due to his other medical problems. They understand the risk include but are not limited to death, stroke, MI, DVT, PE, bleeding, possible need for transfusion, infection, prolonged air leak, cardiac arrhythmias, renal failure, as well as the possibility of unforeseeable complications.  The family seems very anxious to proceed with surgery. However, [name] was unable to decide whether he would like to proceed. I asked him to call our office to schedule the surgery if he does decide to proceed.  Plan: Patient will call if he wishes to proceed with thoracoscopic drainage of the effusion   

## 2013-10-27 ENCOUNTER — Encounter (HOSPITAL_COMMUNITY)
Admission: RE | Admit: 2013-10-27 | Discharge: 2013-10-27 | Disposition: A | Discharge status: Home / Self Care | Payer: Medicare HMO | Source: Ambulatory Visit | Attending: Thoracic Surgery (Cardiothoracic Vascular Surgery) | Admitting: Thoracic Surgery (Cardiothoracic Vascular Surgery)

## 2013-10-27 ENCOUNTER — Encounter (HOSPITAL_COMMUNITY): Payer: Self-pay

## 2013-10-27 VITALS — BP 150/84 | HR 76 | Temp 97.9°F | Resp 20 | Ht 73.0 in | Wt 220.0 lb

## 2013-10-27 DIAGNOSIS — J90 Pleural effusion, not elsewhere classified: Secondary | ICD-10-CM

## 2013-10-27 DIAGNOSIS — Z01818 Encounter for other preprocedural examination: Secondary | ICD-10-CM | POA: Insufficient documentation

## 2013-10-27 DIAGNOSIS — Z01810 Encounter for preprocedural cardiovascular examination: Secondary | ICD-10-CM | POA: Insufficient documentation

## 2013-10-27 DIAGNOSIS — Z01812 Encounter for preprocedural laboratory examination: Secondary | ICD-10-CM | POA: Insufficient documentation

## 2013-10-27 LAB — URINALYSIS, ROUTINE W REFLEX MICROSCOPIC
BILIRUBIN URINE: NEGATIVE
Glucose, UA: NEGATIVE mg/dL
KETONES UR: NEGATIVE mg/dL
Leukocytes, UA: NEGATIVE
NITRITE: NEGATIVE
PH: 5.5 (ref 5.0–8.0)
Protein, ur: 30 mg/dL — AB
SPECIFIC GRAVITY, URINE: 1.014 (ref 1.005–1.030)
Urobilinogen, UA: 0.2 mg/dL (ref 0.0–1.0)

## 2013-10-27 LAB — COMPREHENSIVE METABOLIC PANEL
ALBUMIN: 3.4 g/dL — AB (ref 3.5–5.2)
ALT: 19 U/L (ref 0–53)
AST: 27 U/L (ref 0–37)
Alkaline Phosphatase: 82 U/L (ref 39–117)
BUN: 42 mg/dL — ABNORMAL HIGH (ref 6–23)
CO2: 24 mEq/L (ref 19–32)
CREATININE: 2.29 mg/dL — AB (ref 0.50–1.35)
Calcium: 9.2 mg/dL (ref 8.4–10.5)
Chloride: 102 mEq/L (ref 96–112)
GFR calc Af Amer: 31 mL/min — ABNORMAL LOW (ref >90)
GFR calc non Af Amer: 26 mL/min — ABNORMAL LOW (ref >90)
GLUCOSE: 174 mg/dL — AB (ref 70–99)
Potassium: 4.5 mEq/L (ref 3.7–5.3)
Sodium: 141 mEq/L (ref 137–147)
TOTAL PROTEIN: 8 g/dL (ref 6.0–8.3)
Total Bilirubin: 0.9 mg/dL (ref 0.3–1.2)

## 2013-10-27 LAB — CBC
HEMATOCRIT: 35.9 % — AB (ref 39.0–52.0)
HEMOGLOBIN: 12.5 g/dL — AB (ref 13.0–17.0)
MCH: 34.9 pg — AB (ref 26.0–34.0)
MCHC: 34.8 g/dL (ref 30.0–36.0)
MCV: 100.3 fL — AB (ref 78.0–100.0)
Platelets: 261 10*3/uL (ref 150–400)
RBC: 3.58 MIL/uL — ABNORMAL LOW (ref 4.22–5.81)
RDW: 12.8 % (ref 11.5–15.5)
WBC: 7.9 10*3/uL (ref 4.0–10.5)

## 2013-10-27 LAB — BLOOD GAS, ARTERIAL
Acid-Base Excess: 4.3 mmol/L — ABNORMAL HIGH (ref 0.0–2.0)
Bicarbonate: 28.2 mEq/L — ABNORMAL HIGH (ref 20.0–24.0)
Drawn by: 181601
FIO2: 0.21 %
O2 SAT: 97 %
PATIENT TEMPERATURE: 98.6
TCO2: 29.5 mmol/L (ref 0–100)
pCO2 arterial: 41.4 mmHg (ref 35.0–45.0)
pH, Arterial: 7.448 (ref 7.350–7.450)
pO2, Arterial: 83.5 mmHg (ref 80.0–100.0)

## 2013-10-27 LAB — URINE MICROSCOPIC-ADD ON

## 2013-10-27 LAB — PROTIME-INR
INR: 1.03 (ref 0.00–1.49)
Prothrombin Time: 13.3 seconds (ref 11.6–15.2)

## 2013-10-27 LAB — TYPE AND SCREEN
ABO/RH(D): A NEG
ANTIBODY SCREEN: NEGATIVE

## 2013-10-27 LAB — SURGICAL PCR SCREEN
MRSA, PCR: NEGATIVE
STAPHYLOCOCCUS AUREUS: POSITIVE — AB

## 2013-10-27 LAB — APTT: aPTT: 29 seconds (ref 24–37)

## 2013-10-27 LAB — ABO/RH: ABO/RH(D): A NEG

## 2013-10-27 NOTE — Pre-Procedure Instructions (Signed)
KEIJI MELLAND  10/27/2013   Your procedure is scheduled on:  Monday, October 31, 2013 at 10:31 AM  Report to [facility] (use Main Entrance "A'') at 7:30 AM.  Call this number if you have problems the morning of surgery: [phone]   Remember:   Do not eat food or drink liquids after midnight.   Take these medicines the morning of surgery with A SIP OF WATER: metoprolol (LOPRESSOR) 50 MG tablet,  if needed:LORazepam (ATIVAN) 0.5 MG tablet for anxiety, prednisoLONE acetate (PRED FORTE) 1 % ophthalmic suspension  for scleritis,  traMADol (ULTRAM) 50 MG tablet for severe pain Stop taking vitamins and herbal medications. Do not take any NSAIDs ie: Ibuprofen, Advil, Naproxen and etc.   Do not wear jewelry, make-up or nail polish.  Do not wear lotions, powders, or perfumes. You may wear deodorant.  Do not shave 48 hours prior to surgery. Men may shave face and neck.  Do not bring valuables to the hospital.  [facility] is not responsible for any belongings or valuables.               Contacts, dentures or bridgework may not be worn into surgery.  Leave suitcase in the car. After surgery it may be brought to your room.  For patients admitted to the hospital, discharge time is determined by your treatment team.               Patients discharged the day of surgery will not be allowed to drive home.  Name and phone number of your driver:   Special Instructions:  Special Instructions:Special Instructions: [facility] - Preparing for Surgery  Before surgery, you can play an important role.  Because skin is not sterile, your skin needs to be as free of germs as possible.  You can reduce the number of germs on you skin by washing with CHG (chlorahexidine gluconate) soap before surgery.  CHG is an antiseptic cleaner which kills germs and bonds with the skin to continue killing germs even after washing.  Please DO NOT use if you have an allergy to CHG or antibacterial soaps.  If your skin  becomes reddened/irritated stop using the CHG and inform your nurse when you arrive at Short Stay.  Do not shave (including legs and underarms) for at least 48 hours prior to the first CHG shower.  You may shave your face.  Please follow these instructions carefully:   1.  Shower with CHG Soap the night before surgery and the morning of Surgery.  2.  If you choose to wash your hair, wash your hair first as usual with your normal shampoo.  3.  After you shampoo, rinse your hair and body thoroughly to remove the Shampoo.  4.  Use CHG as you would any other liquid soap.  You can apply chg directly  to the skin and wash gently with scrungie or a clean washcloth.  5.  Apply the CHG Soap to your body ONLY FROM THE NECK DOWN.  Do not use on open wounds or open sores.  Avoid contact with your eyes, ears, mouth and genitals (private parts).  Wash genitals (private parts) with your normal soap.  6.  Wash thoroughly, paying special attention to the area where your surgery will be performed.  7.  Thoroughly rinse your body with warm water from the neck down.  8.  DO NOT shower/wash with your normal soap after using and rinsing off the CHG Soap.  9.  Pat yourself dry with a clean towel.            10.  Wear clean pajamas.            11.  Place clean sheets on your bed the night of your first shower and do not sleep with pets.  Day of Surgery  Do not apply any lotions/deodorants the morning of surgery.  Please wear clean clothes to the hospital/surgery center.   Please read over the following fact sheets that you were given: Pain Booklet, Coughing and Deep Breathing, Blood Transfusion Information and Surgical Site Infection Prevention

## 2013-10-27 NOTE — Progress Notes (Signed)
Anesthesia Chart Review:  Patient is a 73 year old male scheduled for right VATS, drainage of right pleural effusion, decortication, insertion of tunnel pleural catheter on 10/31/13 by Dr. Roxan Hockey. He has had at least two thoracentesis since 07/2013 and had previously refused VATS with catheter. Pathology has shown benign reactive mesothelial cells.  History includes non-smoker, DM2, CVA (embolic with hemorrhagic conversion),  CAD/MI s/p CABG (LIMA to LAD, SVG to D1, Sequential SVG to PDA and PLA) 09/28/00, ischemia cardiomyopathy, CHF, afib (not a Coumadin candidate due to epistaxis and hemorrhagic conversion CVA), HLD, post-operative N/V, SOB, recurrent right pleural effusion, CKD stage II/IVI with acute renal failure 09/2013, tonsillectomy, cholecystectomy. PCP is Dr. Gar Ponto in [place]. Nephrologist is [name].  He has seen Dr. Dannielle Burn in the past, but probably not since 2007.  He does report being evaluated by Dr. Remer Macho with [facility] in [place] around October or November 2014 and was referred to pulmonologist Dr. Luan Pulling in [place]. To his knowledge, he only had an office visit and no cardiac testing there.    EKG on 10/27/13 showed afib @ 89 bpm, inferior infarct (age undetermined), cannot rule out septal infarct (age undetermined), non-specific ST/T changes. Although inferior infarct is not noted on 08/03/13 EKG, it and possible septal infarct were both noted on previous EKGs, some that dated back to 09/16/09. He has been back in afib since at least 07/2013.  Echo on 08/05/13 showed: -Left ventricle: The cavity size was normal. Wall thickness was increased in a pattern of mild LVH. Systolic function was low normal to mildly reduced. The estimated ejection fraction was in the range of 50% to 55%. Images were inadequate for LV wall motion assessment. Indeterminant diastolic function with atrial fibrillation. - Aortic valve: There was no stenosis. Mild regurgitation. - Mitral valve:  Mildly calcified annulus. Trivial regurgitation. - Left atrium: The atrium was moderately dilated. - Right ventricle: Poorly visualized. The cavity size was normal. Systolic function was normal. - Right atrium: The atrium was mildly dilated. - Pulmonary arteries: No complete TR doppler jet so unable to estimate PA systolic pressure. - Inferior vena cava: The vessel was normal in size; the respirophasic diameter changes were in the normal range (= 50%); findings are consistent with normal central venous pressure. - Patient was in atrial fibrillation.  CXR on 10/25/13 showed increase to large right pleural effusion with underlying passive atelectasis.  Sats 92%, R 20 today.  Preoperative labs noted.  Cr is back down around his baseline at 2.29 (previously Cr 3.46 on 10/11/13 with peak at 5.12 on 10/08/13 in the setting of acute on chronic renal failure).  Glucose 174.  H/H 12.5/35.9.   I requested records from [facility], but these are still pending.  I am not expecting any additional testing results.  He denied chest pain.  He does get occasional palpitations but none felt sustained.  He has SOB, occasionally at rest, which is not new since dealing with his pleural effusion.  He denies edema.  He is not very active due to imbalance/fall risk.  Exam shows heart irregularly irregular, lungs clear on left, very diminished on right.  No significant LE edema.  I reviewed currently available information with anesthesiologist Dr. Albertina Parr.  Patient with recent CT surgery evaluation and needs drainage of recurrent pleural effusion.  He is without chest pain and EF 50-55% by recent echo.  Further evaluation by his assigned anesthesiologist on the day of surgery. If no acute changes then it is  anticipated that he can proceed as planned.  Chris [name] [facility] Short Stay Center/Anesthesiology Phone [phone] 10/27/2013 5:10 PM

## 2013-10-28 NOTE — Progress Notes (Signed)
[facility] Card[facility] [phone], was instructed to look under Care Everywhere tab but was unable to print EKG, per office will fax over [place], [place] requested. Called Erica to be aware to look for fax.

## 2013-10-30 MED ORDER — DEXTROSE 5 % IV SOLN
1.5000 g | INTRAVENOUS | Status: AC
  Administered 2013-10-31: 1.5 g via INTRAVENOUS
  Filled 2013-10-30: qty 1.5

## 2013-10-31 ENCOUNTER — Encounter (HOSPITAL_COMMUNITY): Payer: Medicare HMO | Admitting: Vascular Surgery

## 2013-10-31 ENCOUNTER — Inpatient Hospital Stay (HOSPITAL_COMMUNITY): Payer: Medicare HMO

## 2013-10-31 ENCOUNTER — Inpatient Hospital Stay (HOSPITAL_COMMUNITY): Payer: Medicare HMO | Admitting: Certified Registered Nurse Anesthetist

## 2013-10-31 ENCOUNTER — Inpatient Hospital Stay (HOSPITAL_COMMUNITY)
Admission: RE | Admit: 2013-10-31 | Discharge: 2013-11-04 | DRG: 167 | Disposition: A | Discharge status: Home Health | Payer: Medicare HMO | Source: Ambulatory Visit | Attending: Thoracic Surgery (Cardiothoracic Vascular Surgery) | Admitting: Thoracic Surgery (Cardiothoracic Vascular Surgery)

## 2013-10-31 ENCOUNTER — Encounter (HOSPITAL_COMMUNITY)
Admission: RE | Disposition: A | Discharge status: Home Health | Payer: Self-pay | Source: Ambulatory Visit | Attending: Thoracic Surgery (Cardiothoracic Vascular Surgery)

## 2013-10-31 ENCOUNTER — Encounter (HOSPITAL_COMMUNITY): Payer: Self-pay | Admitting: *Deleted

## 2013-10-31 DIAGNOSIS — D7589 Other specified diseases of blood and blood-forming organs: Secondary | ICD-10-CM | POA: Diagnosis present

## 2013-10-31 DIAGNOSIS — Z8673 Personal history of transient ischemic attack (TIA), and cerebral infarction without residual deficits: Secondary | ICD-10-CM

## 2013-10-31 DIAGNOSIS — N184 Chronic kidney disease, stage 4 (severe): Secondary | ICD-10-CM | POA: Diagnosis present

## 2013-10-31 DIAGNOSIS — Z794 Long term (current) use of insulin: Secondary | ICD-10-CM

## 2013-10-31 DIAGNOSIS — E1165 Type 2 diabetes mellitus with hyperglycemia: Secondary | ICD-10-CM | POA: Diagnosis present

## 2013-10-31 DIAGNOSIS — K560 Paralytic ileus: Secondary | ICD-10-CM | POA: Diagnosis not present

## 2013-10-31 DIAGNOSIS — I252 Old myocardial infarction: Secondary | ICD-10-CM

## 2013-10-31 DIAGNOSIS — I509 Heart failure, unspecified: Secondary | ICD-10-CM | POA: Diagnosis present

## 2013-10-31 DIAGNOSIS — E785 Hyperlipidemia, unspecified: Secondary | ICD-10-CM | POA: Diagnosis present

## 2013-10-31 DIAGNOSIS — I872 Venous insufficiency (chronic) (peripheral): Secondary | ICD-10-CM | POA: Diagnosis present

## 2013-10-31 DIAGNOSIS — I129 Hypertensive chronic kidney disease with stage 1 through stage 4 chronic kidney disease, or unspecified chronic kidney disease: Secondary | ICD-10-CM | POA: Diagnosis present

## 2013-10-31 DIAGNOSIS — E119 Type 2 diabetes mellitus without complications: Secondary | ICD-10-CM | POA: Diagnosis present

## 2013-10-31 DIAGNOSIS — J90 Pleural effusion, not elsewhere classified: Principal | ICD-10-CM | POA: Diagnosis present

## 2013-10-31 DIAGNOSIS — Z7982 Long term (current) use of aspirin: Secondary | ICD-10-CM

## 2013-10-31 DIAGNOSIS — Z79899 Other long term (current) drug therapy: Secondary | ICD-10-CM

## 2013-10-31 DIAGNOSIS — D649 Anemia, unspecified: Secondary | ICD-10-CM | POA: Diagnosis present

## 2013-10-31 DIAGNOSIS — I4891 Unspecified atrial fibrillation: Secondary | ICD-10-CM | POA: Diagnosis present

## 2013-10-31 DIAGNOSIS — I2589 Other forms of chronic ischemic heart disease: Secondary | ICD-10-CM | POA: Diagnosis present

## 2013-10-31 DIAGNOSIS — Z951 Presence of aortocoronary bypass graft: Secondary | ICD-10-CM

## 2013-10-31 HISTORY — PX: CHEST TUBE INSERTION: SHX231

## 2013-10-31 HISTORY — PX: VIDEO ASSISTED THORACOSCOPY: SHX5073

## 2013-10-31 HISTORY — PX: PLEURAL EFFUSION DRAINAGE: SHX5099

## 2013-10-31 LAB — POCT I-STAT 4, (NA,K, GLUC, HGB,HCT)
Glucose, Bld: 304 mg/dL — ABNORMAL HIGH (ref 70–99)
HCT: 36 % — ABNORMAL LOW (ref 39.0–52.0)
Hemoglobin: 12.2 g/dL — ABNORMAL LOW (ref 13.0–17.0)
POTASSIUM: 4 meq/L (ref 3.7–5.3)
SODIUM: 140 meq/L (ref 137–147)

## 2013-10-31 LAB — GLUCOSE, CAPILLARY
GLUCOSE-CAPILLARY: 185 mg/dL — AB (ref 70–99)
Glucose-Capillary: 307 mg/dL — ABNORMAL HIGH (ref 70–99)
Glucose-Capillary: 282 mg/dL — ABNORMAL HIGH (ref 70–99)
Glucose-Capillary: 249 mg/dL — ABNORMAL HIGH (ref 70–99)
Glucose-Capillary: 224 mg/dL — ABNORMAL HIGH (ref 70–99)
Glucose-Capillary: 152 mg/dL — ABNORMAL HIGH (ref 70–99)
Glucose-Capillary: 164 mg/dL — ABNORMAL HIGH (ref 70–99)
Glucose-Capillary: 203 mg/dL — ABNORMAL HIGH (ref 70–99)

## 2013-10-31 LAB — LACTATE DEHYDROGENASE, PLEURAL OR PERITONEAL FLUID: LD FL: 137 U/L — AB (ref 3–23)

## 2013-10-31 LAB — BODY FLUID CELL COUNT WITH DIFFERENTIAL
LYMPHS FL: 84 %
Monocyte-Macrophage-Serous Fluid: 15 % — ABNORMAL LOW (ref 50–90)
Neutrophil Count, Fluid: 1 % (ref 0–25)
Total Nucleated Cell Count, Fluid: 288 cu mm (ref 0–1000)

## 2013-10-31 LAB — PROTEIN, BODY FLUID: TOTAL PROTEIN, FLUID: 3.6 g/dL

## 2013-10-31 LAB — GLUCOSE, SEROUS FLUID: GLUCOSE FL: 295 mg/dL

## 2013-10-31 SURGERY — VIDEO ASSISTED THORACOSCOPY
Anesthesia: General | Site: Chest | Laterality: Right

## 2013-10-31 MED ORDER — LIDOCAINE HCL (CARDIAC) 20 MG/ML IV SOLN
INTRAVENOUS | Status: DC | PRN
  Administered 2013-10-31: 100 mg via INTRAVENOUS

## 2013-10-31 MED ORDER — METOPROLOL TARTRATE 25 MG PO TABS
25.0000 mg | ORAL_TABLET | Freq: Two times a day (BID) | ORAL | Status: DC
  Administered 2013-11-01 – 2013-11-04 (×7): 25 mg via ORAL
  Filled 2013-10-31 (×8): qty 1

## 2013-10-31 MED ORDER — LIDOCAINE HCL (CARDIAC) 20 MG/ML IV SOLN
INTRAVENOUS | Status: AC
  Filled 2013-10-31: qty 5

## 2013-10-31 MED ORDER — SODIUM CHLORIDE 0.9 % IV SOLN
INTRAVENOUS | Status: DC
  Administered 2013-10-31: 19:00:00 via INTRAVENOUS

## 2013-10-31 MED ORDER — ACETAMINOPHEN 500 MG PO TABS
1000.0000 mg | ORAL_TABLET | Freq: Four times a day (QID) | ORAL | Status: AC
  Administered 2013-10-31 – 2013-11-01 (×4): 1000 mg via ORAL
  Filled 2013-10-31 (×4): qty 2

## 2013-10-31 MED ORDER — MORPHINE SULFATE 2 MG/ML IJ SOLN
INTRAMUSCULAR | Status: AC
  Administered 2013-10-31: 2 mg via INTRAVENOUS
  Filled 2013-10-31: qty 1

## 2013-10-31 MED ORDER — OXYCODONE-ACETAMINOPHEN 5-325 MG PO TABS: 1.0000 | ORAL_TABLET | ORAL | Status: DC | PRN

## 2013-10-31 MED ORDER — SIMVASTATIN 20 MG PO TABS
20.0000 mg | ORAL_TABLET | Freq: Every day | ORAL | Status: DC
  Administered 2013-11-01 – 2013-11-03 (×3): 20 mg via ORAL
  Filled 2013-10-31 (×4): qty 1

## 2013-10-31 MED ORDER — NEOSTIGMINE METHYLSULFATE 1 MG/ML IJ SOLN
INTRAMUSCULAR | Status: AC
  Filled 2013-10-31: qty 10

## 2013-10-31 MED ORDER — ROCURONIUM BROMIDE 100 MG/10ML IV SOLN
INTRAVENOUS | Status: DC | PRN
  Administered 2013-10-31: 10 mg via INTRAVENOUS
  Administered 2013-10-31: 35 mg via INTRAVENOUS
  Administered 2013-10-31: 5 mg via INTRAVENOUS

## 2013-10-31 MED ORDER — ONDANSETRON HCL 4 MG/2ML IJ SOLN: 4.0000 mg | Freq: Four times a day (QID) | INTRAMUSCULAR | Status: DC | PRN

## 2013-10-31 MED ORDER — CHLORHEXIDINE GLUCONATE CLOTH 2 % EX PADS
6.0000 | MEDICATED_PAD | Freq: Every day | CUTANEOUS | Status: AC
  Administered 2013-10-31 – 2013-11-04 (×5): 6 via TOPICAL

## 2013-10-31 MED ORDER — 0.9 % SODIUM CHLORIDE (POUR BTL) OPTIME
TOPICAL | Status: DC | PRN
  Administered 2013-10-31: 1000 mL

## 2013-10-31 MED ORDER — PHENYLEPHRINE 40 MCG/ML (10ML) SYRINGE FOR IV PUSH (FOR BLOOD PRESSURE SUPPORT)
PREFILLED_SYRINGE | INTRAVENOUS | Status: AC
  Filled 2013-10-31: qty 10

## 2013-10-31 MED ORDER — MIDAZOLAM HCL 2 MG/2ML IJ SOLN
INTRAMUSCULAR | Status: AC
  Filled 2013-10-31: qty 2

## 2013-10-31 MED ORDER — ROCURONIUM BROMIDE 50 MG/5ML IV SOLN
INTRAVENOUS | Status: AC
  Filled 2013-10-31: qty 1

## 2013-10-31 MED ORDER — ACETAMINOPHEN 160 MG/5ML PO SOLN
1000.0000 mg | Freq: Four times a day (QID) | ORAL | Status: AC
  Filled 2013-10-31 (×4): qty 40

## 2013-10-31 MED ORDER — POTASSIUM CHLORIDE 10 MEQ/50ML IV SOLN: 10.0000 meq | Freq: Every day | INTRAVENOUS | Status: DC | PRN

## 2013-10-31 MED ORDER — INSULIN ASPART 100 UNIT/ML ~~LOC~~ SOLN
0.0000 [IU] | Freq: Four times a day (QID) | SUBCUTANEOUS | Status: DC
  Administered 2013-10-31: 4 [IU] via SUBCUTANEOUS
  Administered 2013-10-31: 8 [IU] via SUBCUTANEOUS
  Administered 2013-11-01: 2 [IU] via SUBCUTANEOUS

## 2013-10-31 MED ORDER — PROPOFOL 10 MG/ML IV BOLUS
INTRAVENOUS | Status: AC
  Filled 2013-10-31: qty 20

## 2013-10-31 MED ORDER — MIRTAZAPINE 7.5 MG PO TABS
7.5000 mg | ORAL_TABLET | Freq: Every day | ORAL | Status: DC
  Administered 2013-11-01 – 2013-11-03 (×3): 7.5 mg via ORAL
  Filled 2013-10-31 (×5): qty 1

## 2013-10-31 MED ORDER — OXYCODONE HCL 5 MG PO TABS: 5.0000 mg | ORAL_TABLET | ORAL | Status: AC | PRN

## 2013-10-31 MED ORDER — HYDROMORPHONE HCL PF 1 MG/ML IJ SOLN
INTRAMUSCULAR | Status: AC
Start: 2013-10-31 — End: 2013-11-01
  Filled 2013-10-31: qty 1

## 2013-10-31 MED ORDER — ONDANSETRON HCL 4 MG/2ML IJ SOLN
INTRAMUSCULAR | Status: DC | PRN
  Administered 2013-10-31: 4 mg via INTRAVENOUS

## 2013-10-31 MED ORDER — PHENYLEPHRINE HCL 10 MG/ML IJ SOLN
INTRAMUSCULAR | Status: DC | PRN
  Administered 2013-10-31 (×4): 80 ug via INTRAVENOUS

## 2013-10-31 MED ORDER — LORAZEPAM 0.5 MG PO TABS: 0.5000 mg | ORAL_TABLET | Freq: Two times a day (BID) | ORAL | Status: DC | PRN

## 2013-10-31 MED ORDER — DEXTROSE 5 % IV SOLN
1.5000 g | Freq: Two times a day (BID) | INTRAVENOUS | Status: AC
  Administered 2013-10-31 – 2013-11-01 (×2): 1.5 g via INTRAVENOUS
  Filled 2013-10-31 (×2): qty 1.5

## 2013-10-31 MED ORDER — NEOSTIGMINE METHYLSULFATE 1 MG/ML IJ SOLN
INTRAMUSCULAR | Status: DC | PRN
  Administered 2013-10-31: 3 mg via INTRAVENOUS

## 2013-10-31 MED ORDER — ASPIRIN EC 81 MG PO TBEC
81.0000 mg | DELAYED_RELEASE_TABLET | Freq: Every day | ORAL | Status: DC
  Administered 2013-11-01 – 2013-11-04 (×4): 81 mg via ORAL
  Filled 2013-10-31 (×4): qty 1

## 2013-10-31 MED ORDER — PANTOPRAZOLE SODIUM 40 MG PO TBEC
40.0000 mg | DELAYED_RELEASE_TABLET | Freq: Every day | ORAL | Status: DC
  Administered 2013-11-01 – 2013-11-04 (×4): 40 mg via ORAL
  Filled 2013-10-31 (×4): qty 1

## 2013-10-31 MED ORDER — ONDANSETRON HCL 4 MG/2ML IJ SOLN: 4.0000 mg | Freq: Once | INTRAMUSCULAR | Status: DC | PRN

## 2013-10-31 MED ORDER — FENTANYL CITRATE 0.05 MG/ML IJ SOLN
INTRAMUSCULAR | Status: DC | PRN
  Administered 2013-10-31 (×3): 50 ug via INTRAVENOUS
  Administered 2013-10-31: 100 ug via INTRAVENOUS

## 2013-10-31 MED ORDER — MORPHINE SULFATE 2 MG/ML IJ SOLN
2.0000 mg | INTRAMUSCULAR | Status: DC | PRN
  Administered 2013-10-31 – 2013-11-02 (×4): 2 mg via INTRAVENOUS
  Filled 2013-10-31 (×3): qty 1

## 2013-10-31 MED ORDER — PROPOFOL 10 MG/ML IV BOLUS
INTRAVENOUS | Status: DC | PRN
  Administered 2013-10-31: 130 mg via INTRAVENOUS

## 2013-10-31 MED ORDER — INSULIN ASPART 100 UNIT/ML ~~LOC~~ SOLN
10.0000 [IU] | Freq: Once | SUBCUTANEOUS | Status: AC
  Administered 2013-10-31: 10 [IU] via SUBCUTANEOUS

## 2013-10-31 MED ORDER — GLYCOPYRROLATE 0.2 MG/ML IJ SOLN
INTRAMUSCULAR | Status: DC | PRN
  Administered 2013-10-31: 0.4 mg via INTRAVENOUS

## 2013-10-31 MED ORDER — LACTATED RINGERS IV SOLN
INTRAVENOUS | Status: DC | PRN
  Administered 2013-10-31 (×2): via INTRAVENOUS

## 2013-10-31 MED ORDER — FENTANYL CITRATE 0.05 MG/ML IJ SOLN
INTRAMUSCULAR | Status: AC
  Filled 2013-10-31: qty 5

## 2013-10-31 MED ORDER — TRAMADOL HCL 50 MG PO TABS: 50.0000 mg | ORAL_TABLET | Freq: Four times a day (QID) | ORAL | Status: DC | PRN

## 2013-10-31 MED ORDER — MUPIROCIN 2 % EX OINT
1.0000 | TOPICAL_OINTMENT | Freq: Two times a day (BID) | CUTANEOUS | Status: DC
Start: 2013-10-31 — End: 2013-11-04
  Administered 2013-10-31 – 2013-11-04 (×8): 1 via NASAL

## 2013-10-31 MED ORDER — FENTANYL CITRATE 0.05 MG/ML IJ SOLN
INTRAMUSCULAR | Status: AC
  Administered 2013-10-31: 50 ug via INTRAVENOUS
  Filled 2013-10-31: qty 2

## 2013-10-31 MED ORDER — SENNOSIDES-DOCUSATE SODIUM 8.6-50 MG PO TABS
1.0000 | ORAL_TABLET | Freq: Every evening | ORAL | Status: DC | PRN
  Administered 2013-11-02: 1 via ORAL
  Filled 2013-10-31 (×3): qty 1

## 2013-10-31 MED ORDER — BISACODYL 5 MG PO TBEC
10.0000 mg | DELAYED_RELEASE_TABLET | Freq: Every day | ORAL | Status: DC
  Administered 2013-11-01 – 2013-11-04 (×4): 10 mg via ORAL
  Filled 2013-10-31 (×4): qty 2

## 2013-10-31 MED ORDER — SODIUM CHLORIDE 0.9 % IV SOLN
INTRAVENOUS | Status: DC | PRN
  Administered 2013-10-31: 11:00:00 via INTRAVENOUS

## 2013-10-31 MED ORDER — ONDANSETRON HCL 4 MG/2ML IJ SOLN
INTRAMUSCULAR | Status: AC
  Filled 2013-10-31: qty 2

## 2013-10-31 MED ORDER — INSULIN ASPART 100 UNIT/ML ~~LOC~~ SOLN
10.0000 [IU] | Freq: Once | SUBCUTANEOUS | Status: AC
  Administered 2013-10-31: 10 [IU] via INTRAVENOUS

## 2013-10-31 MED ORDER — GLYCOPYRROLATE 0.2 MG/ML IJ SOLN
INTRAMUSCULAR | Status: AC
  Filled 2013-10-31: qty 2

## 2013-10-31 MED ORDER — HYDROMORPHONE HCL PF 1 MG/ML IJ SOLN
0.2500 mg | INTRAMUSCULAR | Status: DC | PRN
  Administered 2013-10-31 (×2): 0.5 mg via INTRAVENOUS

## 2013-10-31 SURGICAL SUPPLY — 85 items
ADH SKN CLS APL DERMABOND .7 (GAUZE/BANDAGES/DRESSINGS) ×2
APPLIER CLIP ROT 10 11.4 M/L (STAPLE)
APR CLP MED LRG 11.4X10 (STAPLE)
BAG SPEC RTRVL LRG 6X4 10 (ENDOMECHANICALS)
CANISTER SUCTION 2500CC (MISCELLANEOUS) ×4 IMPLANT
CATH KIT ON Q 5IN SLV (PAIN MANAGEMENT) IMPLANT
CATH THORACIC 28FR (CATHETERS) IMPLANT
CATH THORACIC 28FR RT ANG (CATHETERS) IMPLANT
CATH THORACIC 36FR (CATHETERS) IMPLANT
CATH THORACIC 36FR RT ANG (CATHETERS) IMPLANT
CLEANER TIP ELECTROSURG 2X2 (MISCELLANEOUS) ×2 IMPLANT
CLIP APPLIE ROT 10 11.4 M/L (STAPLE) IMPLANT
CLIP TI MEDIUM 6 (CLIP) IMPLANT
CONN ST 1/4X3/8  BEN (MISCELLANEOUS) ×2
CONN ST 1/4X3/8 BEN (MISCELLANEOUS) IMPLANT
CONN Y 3/8X3/8X3/8  BEN (MISCELLANEOUS) ×2
CONN Y 3/8X3/8X3/8 BEN (MISCELLANEOUS) ×2 IMPLANT
CONT SPEC 4OZ CLIKSEAL STRL BL (MISCELLANEOUS) ×8 IMPLANT
COVER SURGICAL LIGHT HANDLE (MISCELLANEOUS) ×4 IMPLANT
DERMABOND ADVANCED (GAUZE/BANDAGES/DRESSINGS) ×2
DERMABOND ADVANCED .7 DNX12 (GAUZE/BANDAGES/DRESSINGS) ×2 IMPLANT
DRAIN CHANNEL 32F RND 10.7 FF (WOUND CARE) ×2 IMPLANT
DRAPE C-ARM 42X72 X-RAY (DRAPES) ×4 IMPLANT
DRAPE LAPAROSCOPIC ABDOMINAL (DRAPES) ×4 IMPLANT
DRAPE WARM FLUID 44X44 (DRAPE) ×4 IMPLANT
ELECT REM PT RETURN 9FT ADLT (ELECTROSURGICAL) ×4
ELECTRODE REM PT RTRN 9FT ADLT (ELECTROSURGICAL) ×2 IMPLANT
GLOVE EUDERMIC 7 POWDERFREE (GLOVE) ×4 IMPLANT
GLOVE SURG SIGNA 7.5 PF LTX (GLOVE) ×8 IMPLANT
GOWN PREVENTION PLUS XLARGE (GOWN DISPOSABLE) ×4 IMPLANT
GOWN STRL NON-REIN LRG LVL3 (GOWN DISPOSABLE) ×8 IMPLANT
HANDLE UNIV ENDO GIA (ENDOMECHANICALS) ×2 IMPLANT
HEMOSTAT SURGICEL 2X14 (HEMOSTASIS) IMPLANT
KIT BASIN OR (CUSTOM PROCEDURE TRAY) ×4 IMPLANT
KIT PLEURX DRAIN CATH 1000ML (MISCELLANEOUS) ×4 IMPLANT
KIT PLEURX DRAIN CATH 15.5FR (DRAIN) ×6 IMPLANT
KIT ROOM TURNOVER OR (KITS) ×4 IMPLANT
KIT SUCTION CATH 14FR (SUCTIONS) ×4 IMPLANT
NS IRRIG 1000ML POUR BTL (IV SOLUTION) ×8 IMPLANT
PACK CHEST (CUSTOM PROCEDURE TRAY) ×4 IMPLANT
PACK GENERAL/GYN (CUSTOM PROCEDURE TRAY) ×4 IMPLANT
PAD ARMBOARD 7.5X6 YLW CONV (MISCELLANEOUS) ×8 IMPLANT
POUCH ENDO CATCH II 15MM (MISCELLANEOUS) IMPLANT
POUCH SPECIMEN RETRIEVAL 10MM (ENDOMECHANICALS) IMPLANT
RELOAD EGIA 45 MED/THCK PURPLE (STAPLE) ×4 IMPLANT
SEALANT PROGEL (MISCELLANEOUS) ×4 IMPLANT
SEALANT SURG COSEAL 4ML (VASCULAR PRODUCTS) IMPLANT
SEALANT SURG COSEAL 8ML (VASCULAR PRODUCTS) IMPLANT
SET DRAINAGE LINE (MISCELLANEOUS) IMPLANT
SOLUTION ANTI FOG 6CC (MISCELLANEOUS) ×4 IMPLANT
SPECIMEN JAR MEDIUM (MISCELLANEOUS) ×4 IMPLANT
SPONGE GAUZE 4X4 12PLY (GAUZE/BANDAGES/DRESSINGS) ×4 IMPLANT
SPONGE INTESTINAL PEANUT (DISPOSABLE) ×2 IMPLANT
SUT ETHILON 3 0 FSL (SUTURE) ×6 IMPLANT
SUT PROLENE 4 0 RB 1 (SUTURE)
SUT PROLENE 4-0 RB1 .5 CRCL 36 (SUTURE) IMPLANT
SUT SILK  1 MH (SUTURE) ×4
SUT SILK 1 MH (SUTURE) ×4 IMPLANT
SUT SILK 2 0SH CR/8 30 (SUTURE) IMPLANT
SUT SILK 3 0SH CR/8 30 (SUTURE) IMPLANT
SUT VIC AB 1 CTX 36 (SUTURE) ×4
SUT VIC AB 1 CTX36XBRD ANBCTR (SUTURE) IMPLANT
SUT VIC AB 2-0 CT1 27 (SUTURE) ×4
SUT VIC AB 2-0 CT1 TAPERPNT 27 (SUTURE) IMPLANT
SUT VIC AB 2-0 CTX 36 (SUTURE) ×2 IMPLANT
SUT VIC AB 2-0 UR6 27 (SUTURE) IMPLANT
SUT VIC AB 3-0 MH 27 (SUTURE) IMPLANT
SUT VIC AB 3-0 SH 27 (SUTURE) ×8
SUT VIC AB 3-0 SH 27X BRD (SUTURE) IMPLANT
SUT VIC AB 3-0 X1 27 (SUTURE) ×4 IMPLANT
SUT VICRYL 2 TP 1 (SUTURE) IMPLANT
SWAB COLLECTION DEVICE MRSA (MISCELLANEOUS) IMPLANT
SYSTEM SAHARA CHEST DRAIN ATS (WOUND CARE) ×4 IMPLANT
TAPE CLOTH 4X10 WHT NS (GAUZE/BANDAGES/DRESSINGS) ×4 IMPLANT
TAPE CLOTH SURG 4X10 WHT LF (GAUZE/BANDAGES/DRESSINGS) ×2 IMPLANT
TIP APPLICATOR SPRAY EXTEND 16 (VASCULAR PRODUCTS) ×4 IMPLANT
TOWEL OR 17X24 6PK STRL BLUE (TOWEL DISPOSABLE) ×4 IMPLANT
TOWEL OR 17X26 10 PK STRL BLUE (TOWEL DISPOSABLE) ×8 IMPLANT
TRAP SPECIMEN MUCOUS 40CC (MISCELLANEOUS) ×6 IMPLANT
TRAY FOLEY CATH 14FRSI W/METER (CATHETERS) ×4 IMPLANT
TROCAR XCEL BLADELESS 5X75MML (TROCAR) ×2 IMPLANT
TUBE ANAEROBIC SPECIMEN COL (MISCELLANEOUS) IMPLANT
TUNNELER SHEATH ON-Q 11GX8 DSP (PAIN MANAGEMENT) IMPLANT
WATER STERILE IRR 1000ML POUR (IV SOLUTION) ×8 IMPLANT
YANKAUER SUCT BULB TIP NO VENT (SUCTIONS) ×2 IMPLANT

## 2013-10-31 NOTE — Brief Op Note (Addendum)
10/31/2013  1:26 PM  PATIENT:  Chris [name]  73 y.o. male  PRE-OPERATIVE DIAGNOSIS:  RECURRENT RIGHT PLEURAL EFFUSION  POST-OPERATIVE DIAGNOSIS:  RECURRENT RIGHT PLEURAL EFFUSION  PROCEDURE: RIGHT VIDEO ASSISTED THORACOSCOPY  DRAINAGE OF RIGHT PLEURAL EFFUSION  RLL BIOPSY/ PLEURAL BIOPSY  INSERTION RIGHT PLEUR XCATHETER   Approximately 3000 cc of right pleural fluid removed  SURGEON:  Surgeon(s) and Role:    * Melrose Nakayama, MD - Primary  PHYSICIAN ASSISTANT: Lars Pinks PA-C  ANESTHESIA:   general  EBL:  Total I/O In: 1200 [I.V.:1200] Out: 25 [Urine:200; Blood:200]  BLOOD ADMINISTERED:none  DRAINS: 86 French straight chest tube and 2 Blake drain in the right pleural space   SPECIMEN:  Source of Specimen:  RLL biopsy, pleural biopsy for pathology; pleural fluid sent for culture and cytology  COUNTS CORRECT:  YES  PLAN OF CARE: Admit to inpatient   PATIENT DISPOSITION:  PACU - hemodynamically stable.   Delay start of Pharmacological VTE agent (>24hrs) due to surgical blood loss or risk of bleeding: yes

## 2013-10-31 NOTE — Transfer of Care (Signed)
Immediate Anesthesia Transfer of Care Note  Patient: Chris [name]  Procedure(s) Performed: Procedure(s): VIDEO ASSISTED THORACOSCOPY (Right) DRAINAGE OF PLEURAL EFFUSION (Right) INSERTION PLEURAL DRAINAGE CATHETER (Right)  Patient Location: PACU  Anesthesia Type:General  Level of Consciousness: awake, alert  and patient cooperative  Airway & Oxygen Therapy: Patient Spontanous Breathing and Patient connected to nasal cannula oxygen  Post-op Assessment: Report given to PACU RN and Post -op Vital signs reviewed and stable  Post vital signs: Reviewed and stable  Complications: No apparent anesthesia complications

## 2013-10-31 NOTE — Progress Notes (Signed)
0825 AM:  Called by RN in Short Stay.  Patient for VATS today by Dr. Roxan Hockey.  CBG this morning was 307 mg/dl.  Per RN, patient takes Levemir 40 units QAM at home.  Last took Levemir yesterday (02/08) at ~5am.  Recommended to RN that she contact anesthesia for insulin orders.  DM Coordinator recommends IV insulin drip for the perioperative period.  If IV insulin drip not desired by anesthesia, then patient could get Novolog SQ before surgery.  RN in short stay will need to contact MD with anesthesia for insulin orders.  Wyn Quaker RN, MSN, CDE Diabetes Coordinator Inpatient Diabetes Program Team Pager: [phone] (8a-10p)

## 2013-10-31 NOTE — Anesthesia Postprocedure Evaluation (Signed)
  Anesthesia Post-op Note  Patient: Chris [name]  Procedure(s) Performed: Procedure(s): VIDEO ASSISTED THORACOSCOPY (Right) DRAINAGE OF PLEURAL EFFUSION (Right) INSERTION PLEURAL DRAINAGE CATHETER (Right)  Patient Location: PACU  Anesthesia Type:General  Level of Consciousness: awake, oriented, sedated and patient cooperative  Airway and Oxygen Therapy: Patient Spontanous Breathing  Post-op Pain: moderate  Post-op Assessment: Post-op Vital signs reviewed, Patient's Cardiovascular Status Stable, Respiratory Function Stable, Patent Airway, No signs of Nausea or vomiting and Pain level controlled  Post-op Vital Signs: stable  Complications: No apparent anesthesia complications

## 2013-10-31 NOTE — Anesthesia Procedure Notes (Signed)
Procedure Name: Intubation Date/Time: 10/31/2013 11:39 AM Performed by: Manuela Schwartz B Pre-anesthesia Checklist: Patient identified, Emergency Drugs available, Suction available, Patient being monitored and Timeout performed Patient Re-evaluated:Patient Re-evaluated prior to inductionOxygen Delivery Method: Circle system utilized Preoxygenation: Pre-oxygenation with 100% oxygen Intubation Type: IV induction Ventilation: Mask ventilation without difficulty Laryngoscope Size: Mac and 4 Grade View: Grade III Endobronchial tube: Left, Double lumen EBT, EBT position confirmed by auscultation and EBT position confirmed by fiberoptic bronchoscope and 39 Fr Number of attempts: 1 Airway Equipment and Method: Stylet Placement Confirmation: positive ETCO2,  ETT inserted through vocal cords under direct vision and breath sounds checked- equal and bilateral Tube secured with: Tape Dental Injury: Teeth and Oropharynx as per pre-operative assessment

## 2013-10-31 NOTE — Interval H&P Note (Signed)
History and Physical Interval Note:  10/31/2013 11:17 AM  Chris [name]  has presented today for surgery, with the diagnosis of RECURRENT RIGHT PLEURAL EFFUSION  The various methods of treatment have been discussed with the patient and family. After consideration of risks, benefits and other options for treatment, the patient has consented to  Procedure(s): VIDEO ASSISTED THORACOSCOPY (Right) DECORTICATION (Right) DRAINAGE OF PLEURAL EFFUSION (Right) INSERTION PLEURAL DRAINAGE CATHETER (Right) as a surgical intervention .  The patient's history has been reviewed, patient examined, no change in status, stable for surgery.  I have reviewed the patient's chart and labs.  Questions were answered to the patient's satisfaction.     HENDRICKSON,STEVEN C

## 2013-10-31 NOTE — Anesthesia Preprocedure Evaluation (Addendum)
Anesthesia Evaluation  Patient identified by MRN, date of birth, ID band Patient awake    Reviewed: Allergy & Precautions, H&P , NPO status , Patient's Chart, lab work & pertinent test results, reviewed documented beta blocker date and time   History of Anesthesia Complications (+) PONV and history of anesthetic complications  Airway Mallampati: II      Dental  (+) Teeth Intact   Pulmonary          Cardiovascular hypertension, Pt. on medications + Past MI and +CHF Rhythm:Irregular  Afib   Neuro/Psych  Neuromuscular disease CVA, Residual Symptoms    GI/Hepatic GERD-  Medicated,  Endo/Other  diabetes, Type 2, Insulin Dependent  Renal/GU Renal InsufficiencyRenal disease     Musculoskeletal   Abdominal   Peds  Hematology   Anesthesia Other Findings   Reproductive/Obstetrics                          Anesthesia Physical Anesthesia Plan  ASA: III  Anesthesia Plan: General   Post-op Pain Management:    Induction: Intravenous  Airway Management Planned: Double Lumen EBT  Additional Equipment: Arterial line and CVP  Intra-op Plan:   Post-operative Plan: Extubation in OR and Possible Post-op intubation/ventilation  Informed Consent: I have reviewed the patients History and Physical, chart, labs and discussed the procedure including the risks, benefits and alternatives for the proposed anesthesia with the patient or authorized representative who has indicated his/her understanding and acceptance.     Plan Discussed with: CRNA, Anesthesiologist and Surgeon  Anesthesia Plan Comments:         Anesthesia Quick Evaluation

## 2013-10-31 NOTE — Preoperative (Signed)
Beta Blockers   Reason not to administer Beta Blockers:Not Applicable 

## 2013-10-31 NOTE — H&P (View-Only) (Signed)
HPI:  Chris [name] returns today for followup of his recurrent right pleural effusion. He is a 73 year old gentleman who has had difficulties with a right pleural effusion dating back to last fall. He had a thoracentesis in November which showed a transudate, however there were some atypical cells on cytology. The effusion recurred. I saw him in December and recommended that we do a right vats to drain the effusion and to obtain pleural biopsies. He refused at that time. We did another thoracentesis.   He missed his followup appointment, and ended up in the emergency room at [facility] with a recurrent effusion. He was transferred to [facility]. He again refused surgery during that admission. A repeat thoracentesis was done. He had some hypotension around the time of thoracentesis.  He was discharged from [facility] and then ended up in the emergency room at [facility] Hours later unable to void. He was admitted with acute on chronic renal failure. He was discharged a few days ago.  He says that he is not doing well. He continues to have abdominal complaints. He says that he is just guessing about his insulin dose, because neither he nor his wife can see well enough to draw it up accurately. He does not feel particularly short of breath, but is not engaging in any significant physical activity.  Past Medical History  Diagnosis Date  . 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 Diabetes mellitus   . Macrocytosis     normal B12 & folate levels  . Ischemic cardiomyopathy     EF of 45-50% - posterior & inferior walls are severly hypokinetic  . Atrial fibrillation     not candidate for chronic anticoagulation with Coumadin given chrronic epistaxis as well as hemorrhagic conversion of his acute cva   . Hyperlipidemia   . Hypertension   . Complication of anesthesia   . PONV (postoperative nausea and vomiting)   . Acute renal  insufficiency     resolved  . Myocardial infarct   . CHF (congestive heart failure)   . Shortness of breath   . Pleural effusion 10/05/2013      Current Outpatient Prescriptions  Medication Sig Dispense Refill  . aspirin EC 81 MG tablet Take 81 mg by mouth daily.      . Cholecalciferol (VITAMIN D-3) 5000 UNITS TABS Take 5,000 Units by mouth daily.       . furosemide (LASIX) 40 MG tablet Take 0.5 tablets (20 mg total) by mouth 2 (two) times daily.  30 tablet  1  . Insulin Detemir (LEVEMIR FLEXPEN) 100 UNIT/ML SOPN Inject 40 Units into the skin daily.  2 pen  2  . LORazepam (ATIVAN) 0.5 MG tablet Take 0.5 mg by mouth 2 (two) times daily as needed for anxiety.      . lovastatin (MEVACOR) 40 MG tablet Take 40 mg by mouth at bedtime.        Marland Kitchen lubiprostone (AMITIZA) 24 MCG capsule Take 24 mcg by mouth daily as needed for constipation.      . Magnesium 250 MG TABS Take 250 mg by mouth daily. OTC      . metoprolol (LOPRESSOR) 50 MG tablet Take 50 mg by mouth 2 (two) times daily.      . mirtazapine (REMERON) 7.5 MG tablet Take 1 tablet (7.5 mg total) by mouth at bedtime.  30 tablet  0  . 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       No current facility-administered medications for this visit.    Physical Exam BP 156/78  Pulse 82  Resp 20  Ht 6\' 2"  (1.88 m)  Wt 217 lb (98.431 kg)  BMI 27.85 kg/m2  SpO2 92% Elderly 73 year old man in no acute distress Moves slowly, flat affect Oriented to person place and time No cervical or subclavicular adenopathy Cardiac irregular rate and rhythm Lungs absent breath sounds right base No peripheral edema  Diagnostic Tests: Chest x-ray 10/25/2013 CHEST 2 VIEW  COMPARISON: 10/07/2013  FINDINGS:  There is a large right pleural effusion which is increased in size  moderately when compared to the prior study. Stable mild cardiac   enlargement. Vascular pattern normal. Patient is status post CABG.  IMPRESSION:  Increase in the size of right pleural effusion. Right pleural  effusion is now large with considerable underlying passive  atelectasis.  Electronically Signed  By: Skipper Cliche M.D.  On: 10/25/2013 12:31  Impression: 73 year old with a recurrent right pleural effusion. Not surprisingly this has recurred since his most recent thoracentesis a few weeks ago. I once again recommended to him that we proceed with right VATS to drain the effusion, possible decortication and possible Pleurx catheter placement. I discussed with the patient and his family (wife and one son) the indications, risks, benefits, and alternatives. They understand the high-risk nature of the procedure and Chris [name] due to his other medical problems. They understand the risk include but are not limited to death, stroke, MI, DVT, PE, bleeding, possible need for transfusion, infection, prolonged air leak, cardiac arrhythmias, renal failure, as well as the possibility of unforeseeable complications.  The family seems very anxious to proceed with surgery. However, Chris [name] was unable to decide whether he would like to proceed. I asked him to call our office to schedule the surgery if he does decide to proceed.  Plan: Patient will call if he wishes to proceed with thoracoscopic drainage of the effusion

## 2013-11-01 ENCOUNTER — Inpatient Hospital Stay (HOSPITAL_COMMUNITY): Payer: Medicare HMO

## 2013-11-01 LAB — GLUCOSE, CAPILLARY
GLUCOSE-CAPILLARY: 152 mg/dL — AB (ref 70–99)
GLUCOSE-CAPILLARY: 183 mg/dL — AB (ref 70–99)
Glucose-Capillary: 148 mg/dL — ABNORMAL HIGH (ref 70–99)
Glucose-Capillary: 219 mg/dL — ABNORMAL HIGH (ref 70–99)
Glucose-Capillary: 274 mg/dL — ABNORMAL HIGH (ref 70–99)

## 2013-11-01 LAB — BLOOD GAS, ARTERIAL
Acid-Base Excess: 1.3 mmol/L (ref 0.0–2.0)
BICARBONATE: 25.9 meq/L — AB (ref 20.0–24.0)
Drawn by: 31101
O2 Content: 2 L/min
O2 SAT: 98.4 %
Patient temperature: 98.6
TCO2: 27.3 mmol/L (ref 0–100)
pCO2 arterial: 45.5 mmHg — ABNORMAL HIGH (ref 35.0–45.0)
pH, Arterial: 7.374 (ref 7.350–7.450)
pO2, Arterial: 102 mmHg — ABNORMAL HIGH (ref 80.0–100.0)

## 2013-11-01 LAB — BASIC METABOLIC PANEL
BUN: 42 mg/dL — AB (ref 6–23)
CHLORIDE: 104 meq/L (ref 96–112)
CO2: 26 meq/L (ref 19–32)
CREATININE: 2.29 mg/dL — AB (ref 0.50–1.35)
Calcium: 8.1 mg/dL — ABNORMAL LOW (ref 8.4–10.5)
GFR calc Af Amer: 31 mL/min — ABNORMAL LOW (ref >90)
GFR calc non Af Amer: 26 mL/min — ABNORMAL LOW (ref >90)
Glucose, Bld: 132 mg/dL — ABNORMAL HIGH (ref 70–99)
Potassium: 4.8 mEq/L (ref 3.7–5.3)
Sodium: 141 mEq/L (ref 137–147)

## 2013-11-01 LAB — CBC
HCT: 31.7 % — ABNORMAL LOW (ref 39.0–52.0)
Hemoglobin: 10.7 g/dL — ABNORMAL LOW (ref 13.0–17.0)
MCH: 33.9 pg (ref 26.0–34.0)
MCHC: 33.8 g/dL (ref 30.0–36.0)
MCV: 100.3 fL — AB (ref 78.0–100.0)
Platelets: 226 10*3/uL (ref 150–400)
RBC: 3.16 MIL/uL — ABNORMAL LOW (ref 4.22–5.81)
RDW: 13.1 % (ref 11.5–15.5)
WBC: 11.1 10*3/uL — ABNORMAL HIGH (ref 4.0–10.5)

## 2013-11-01 MED ORDER — INSULIN DETEMIR 100 UNIT/ML ~~LOC~~ SOLN
20.0000 [IU] | Freq: Every day | SUBCUTANEOUS | Status: DC
  Administered 2013-11-01: 20 [IU] via SUBCUTANEOUS
  Filled 2013-11-01 (×2): qty 0.2

## 2013-11-01 MED ORDER — INSULIN DETEMIR 100 UNIT/ML ~~LOC~~ SOLN
10.0000 [IU] | Freq: Every day | SUBCUTANEOUS | Status: DC
  Filled 2013-11-01: qty 0.1

## 2013-11-01 MED ORDER — INSULIN ASPART 100 UNIT/ML ~~LOC~~ SOLN: 0.0000 [IU] | Freq: Every day | SUBCUTANEOUS | Status: DC

## 2013-11-01 MED ORDER — SODIUM CHLORIDE 0.45 % IV SOLN
INTRAVENOUS | Status: DC
  Administered 2013-11-01: 10:00:00 via INTRAVENOUS

## 2013-11-01 MED ORDER — INSULIN ASPART 100 UNIT/ML ~~LOC~~ SOLN
0.0000 [IU] | Freq: Three times a day (TID) | SUBCUTANEOUS | Status: DC
  Administered 2013-11-01: 5 [IU] via SUBCUTANEOUS
  Administered 2013-11-01: 3 [IU] via SUBCUTANEOUS
  Administered 2013-11-02: 5 [IU] via SUBCUTANEOUS
  Administered 2013-11-02 (×2): 2 [IU] via SUBCUTANEOUS

## 2013-11-01 MED ORDER — ENOXAPARIN SODIUM 40 MG/0.4ML ~~LOC~~ SOLN
40.0000 mg | SUBCUTANEOUS | Status: DC
  Administered 2013-11-01 – 2013-11-04 (×4): 40 mg via SUBCUTANEOUS
  Filled 2013-11-01 (×5): qty 0.4

## 2013-11-01 MED ORDER — PREDNISOLONE ACETATE 1 % OP SUSP
1.0000 [drp] | Freq: Four times a day (QID) | OPHTHALMIC | Status: DC | PRN
  Filled 2013-11-01: qty 1

## 2013-11-01 NOTE — Progress Notes (Signed)
[facility]  Patient Status: Active (receiving services up to time of hospitalization)  [facility] is providing the following services: PT and OT  If patient discharges after hours, please call [phone].   Chris [name] 11/01/2013, 10:10 AM

## 2013-11-01 NOTE — Care Management Note (Signed)
    Page 1 of 2   11/04/2013     10:56:25 AM   CARE MANAGEMENT NOTE 11/04/2013  Patient:  Chris [name], Chris [name]   Account Number:  [id number]  Date Initiated:  11/01/2013  Documentation initiated by:  Marvetta Gibbons  Subjective/Objective Assessment:   Pt admitted s/p VATS and pleurX cath placement     Action/Plan:   PTA pt lived at home with spouse   Anticipated DC Date:  11/04/2013   Anticipated DC Plan:  [place]  CM consult      [facility] Choice  Resumption Of Svcs/PTA Provider   Choice offered to / List presented to:  [name]        [place] arranged  HH-1 RN      [place].   Status of service:  Completed, signed off Medicare Important Message given?   (If response is "NO", the following Medicare IM given date fields will be blank) Date Medicare IM given:   Date Additional Medicare IM given:    Discharge Disposition:  [place]  Per UR Regulation:  Reviewed for med. necessity/level of care/duration of stay  If discussed at [place] of Stay Meetings, dates discussed:    Comments:  11/04/13- 1030- Marvetta Gibbons RN BSN- [phone] Pt for possible d/c later today- orders in for [place] for PleurX cath drainage every 3rd day (to be drained this am by bedside RN here) notified Butch Penny with [facility] of new orders- paperwork for PleurX has been signed by MD- CM has finished paperwork and faxed to [place]- original paperwork to be mailed to [place]. Pt has box of drainage kits in room to take home with him for initial supply.   11/01/13- 1545- Marvetta Gibbons RN, BSN [phone] Pt with PleurX cath placement- forms started and placed on shadow chart for signature- in to speak with pt regarding d/c plans- per pt he has had [place] in past but does not remember name of agency- states that MD has told him that an RN would be coming out to assist with catheter at home and is agreeable - will f/u with agency of  choice- will need HH-RN orders along with instructions for pleurX- NCM to follow up with faxing of forms to [facility] once signed - and arrange [facility] per MD orders prior to discharge. Notified by [facility] that pt was active with them for HH-PT/OT prior to admission- will need resumption orders in addition to adding HH-RN for pleurX needs

## 2013-11-01 NOTE — Progress Notes (Signed)
Utilization review completed. Jessica Checketts, RN, BSN. 

## 2013-11-01 NOTE — Progress Notes (Signed)
Inpatient Diabetes Program Recommendations  AACE/ADA: New Consensus Statement on Inpatient Glycemic Control (2013)  Target Ranges:  Prepandial:   less than 140 mg/dL      Peak postprandial:   less than 180 mg/dL (1-2 hours)      Critically ill patients:  140 - 180 mg/dL   Elevated HgbA1C at 8.6% Pt takes levemir 50 units at home, however he states he does not always take that much. Levemir 20 units ordered here. Some fastings good and others high. Will recheck. Only on clear liquids, Acute kidney injury with elevated creatinine.  May consider Increase in correction to moderate tidwc if cbg's continue to be elevated  Thank you, Rosita Kea, RN, CNS, Diabetes Coordinator [phone])

## 2013-11-01 NOTE — Op Note (Signed)
NAMEMarland Kitchen  DEMOSTHENES, VIRNIG NO.:  [id number]  MEDICAL RECORD NO.:  [id number]  LOCATION:  3S11C                        FACILITY:  [facility]  PHYSICIAN:  Revonda Standard. Roxan Hockey, M.D.DATE OF BIRTH:  [birth year]  DATE OF PROCEDURE:  10/31/2013 DATE OF DISCHARGE:                              OPERATIVE REPORT   PREOPERATIVE DIAGNOSIS:  Recurrent right pleural effusion.  POSTOPERATIVE DIAGNOSIS:  Recurrent right pleural effusion.  PROCEDURE:  Right video-assisted thoracoscopy, drainage of right pleural effusion, pleural biopsy, right lower lobe biopsy, insertion of right PleurX catheter.  SURGEON:  Revonda Standard. Roxan Hockey, MD  ASSISTANT:  Lars Pinks, PA  ANESTHESIA:  General.  FINDINGS:  3 L of straw-colored fluid within the right chest, some entrapment of the right lower and middle lobes with a very thin pleural peel which was difficult and, in places, impossible to decorticate. Inflammatory appearance to pleura. Lung edematous and tore easily with attempts at decortication.  CLINICAL NOTE:  Mr. Treichler is a 73 year old gentleman, who has had problems with a recurrent right pleural effusion dating back to last fall. He had multiple thoracenteses but always had rapid recurrence of the effusion.  I had seen him in mid December and recommended a thoracoscopy, he refused at that time.  He then was admitted with a recurrent effusion requiring another thoracentesis at that time but he again refused surgery.  Again, he had a rapid recurrence and now he wishes to proceed with surgical drainage, possible decortication, and possible PleurX catheter placement.  The indications, risks, benefits, and alternatives were discussed in detail with the patient.  He accepted those risks and agreed to proceed.  OPERATIVE NOTE:  Mr. Durbin was brought to the preoperative holding area on October 31, 2013.  There Anesthesia placed a central line and an arterial blood pressure monitoring  line.  Intravenous antibiotics were administered.  He was taken to the operating room, anesthetized, and intubated with a double lumen endotracheal tube.  A Foley catheter was placed.  Sequential compressive devices were placed on the legs for DVT prophylaxis.  He was placed in a left lateral decubitus position, and the right chest was prepped and draped in usual sterile fashion.  Single lung ventilation of the left lung was carried out and was tolerated well throughout the procedure.  A port type incision was made in the right lateral chest in the mid axillary line in approximately the seventh intercostal space.  The chest was entered bluntly using a hemostat.  A sucker was placed in the chest and straw-colored fluid was evacuated, a total of 3 L of fluid was evacuated and this was sent for protein, glucose, LDH, cell count with differential cultures and cytology.  A trocar then was inserted through this incision, and a 5 mm thoracoscope was placed in the chest. There was some residual fluid.  A small working incision was made anterolaterally in about the fifth intercostal space.  The remainder of the effusion was evacuated.  There was some entrapment of the lung but the peel was extremely thin and the lung itself was edematous and relatively friable.  Attempts to grasp the lung and retract it resulted in tearing of the  lung and no good plane for an extensive decortication could ever be developed.  Some of the peel was removed and sent for pathology.  Inspection of the parietal pleura revealed some inflammatory changes inferiorly.  Biopsies were taken and also sent for permanent pathology.  Additional attempts at decortication resulted in some additional tearing and bleeding of the lung in the inferior aspect of the lung and a flap developed.  It was felt the best way to control this was with a combination of stapling and sutures.  A small portion of the lung was removed with the  stapler and this was sent for permanent pathology.  Suture then was used to repair the remainder of the defect and  ProGEL was later applied to this area as well.  It was clear that a complete decortication was not going to be technically possible without running undue risk of extensively denuding the lung resulting in severe air leaks.  Given the patient's age and associated medical problems, this was not felt to be a viable option.  The decision was made to place a PleurX catheter for more chronic management of the effusion.  The PleurX was placed through separate incisions.  A small exit site was made and a 1-cm incision was made for placement of the catheter.  A wire was passed into the chest.  The PleurX was tunneled from the exit site to the entry site leaving the cuff of the PleurX just under the skin at the exit site.  The PleurX then was placed into the chest and placed in a posterolateral location.  A 32- [place] Blake drain also was placed through the original port incision and directed into a posterolateral location as well.  The lung was reinflated.  There was essentially complete re-expansion of the upper lobe and the majority of the lower and middle lobes. There were some areas that did not re-expand as well. There were some small air leak evident.  The utility incision was closed with #1 Vicryl fascial suture followed by 2-0 Vicryl subcutaneous suture and 3-0 Vicryl subcuticular suture.  The exit site of the PleurX catheter was closed with a 3-0 Vicryl subcuticular closure.  The chest tube was placed to suction.  The patient was placed back in a supine position.  He was extubated in the operating room and taken to the postanesthetic care unit in good condition.     Revonda Standard Roxan Hockey, M.D.     SCH/MEDQ  D:  10/31/2013  T:  11/01/2013  Job:  [id number]

## 2013-11-01 NOTE — Progress Notes (Addendum)
      [address]       [place] [address]             [phone]       1 Day Post-Op Procedure(s) (LRB): VIDEO ASSISTED THORACOSCOPY (Right) DRAINAGE OF PLEURAL EFFUSION (Right) INSERTION PLEURAL DRAINAGE CATHETER (Right)  Subjective: Patient sitting in chair. Has minor incisional pain.  Objective: Vital signs in last 24 hours: Temp:  [97.3 F (36.3 C)-98.5 F (36.9 C)] 97.6 F (36.4 C) (02/10 0451) Pulse Rate:  [45-109] 89 (02/10 0435) Cardiac Rhythm:  [-] Atrial fibrillation (02/10 0435) Resp:  [9-19] 18 (02/10 0435) BP: (115-146)/(53-69) 130/54 mmHg (02/10 0435) SpO2:  [94 %-100 %] 97 % (02/10 0435) Arterial Line BP: (104-157)/(44-63) 157/61 mmHg (02/10 0435) Weight:  [100.1 kg (220 lb 10.9 oz)] 100.1 kg (220 lb 10.9 oz) (02/09 1800)      Intake/Output from previous day: 02/09 0701 - 02/10 0700 In: 3021.8 [P.O.:360; I.V.:2611.8; IV Piggyback:50] Out: 1290 [Urine:360; Blood:200; Chest Tube:730]   Physical Exam:  Cardiovascular: RRR Pulmonary: Clear to auscultation on the left and diminished right base; no rales, wheezes, or rhonchi. Abdomen: Soft, non tender, bowel sounds present. Extremities: No LE edema, chronic venous stasis changes Wounds: Clean and dry.  No erythema or signs of infection. Chest Tube: To suction and no air leak  Lab Results: CBC: Recent Labs  10/31/13 0750 11/01/13 0441  WBC  --  11.1*  HGB 12.2* 10.7*  HCT 36.0* 31.7*  PLT  --  226   BMET:  Recent Labs  10/31/13 0750 11/01/13 0441  NA 140 141  K 4.0 4.8  CL  --  104  CO2  --  26  GLUCOSE 304* 132*  BUN  --  42*  CREATININE  --  2.29*  CALCIUM  --  8.1*    PT/INR: No results found for this basename: LABPROT, INR,  in the last 72 hours ABG:  INR: Will add last result for INR, ABG once components are confirmed Will add last 4 CBG results once components are confirmed  Assessment/Plan:  1. CV - SR. 2.  Pulmonary - Chest tube with 730 cc of  output. Chest tube is to suction and there is no air leak.Leave to suction.CXR shows small right pneumothorax, minor subcutaneous emphysema on the right,small right pleural effusion and atelectasis, and stable cardiomegaly. Will discuss with surgeon when to drain Pleur X catheter 3. Remove a line 4. Anemia-H and H 10.7 and 31.7 5.DM-CBGs 203/185/152. Start low dose insulin (was on pre op) and adjust as needed. 6. Creatinine 2.29. Had acute renal insufficiency in the past and Creatinine in January above 3.4. Monitor. 7. Decrease IVF when tolerates diet better 8. Lovenox for DVT proph  ZIMMERMAN,DONIELLE MPA-C 11/01/2013,8:02 AM  Patient seen and examined. He is doing quite well overall Keep CT in and on suction today- there has been some further re-expansion of the RLL and we want to keep it as expanded as possible. No air leak Mobilize   GFR 20-30 range consistently- CKD stage 3

## 2013-11-01 NOTE — Clinical Documentation Improvement (Signed)
THIS DOCUMENT IS NOT A PERMANENT PART OF THE MEDICAL RECORD  Please update your documentation with the medical record to reflect your response to this query. If you need help knowing how to do this please call [phone].  11/01/13   Dr. Roxan Hockey and/or Associates,  In a better effort to capture your patient's severity of illness, reflect appropriate length of stay and utilization of resources, a review of the patient medical record has revealed the following clinical indicators:   - GFR  (white male) has ranged from the 20's to 30's since 2010 per review of [facility]   - Known history of Hypertension and Diabetes   - Serial Chemistries this admission   Based on your clinical judgment, please document in the progress notes and discharge summary if a condition below provides greater specificity regarding the patient's renal status:    - CKD Stage III - IV  (GFR 20's to 30's)   - Other Condition   - Unable to Clinical Determine   In responding to this query please exercise your independent judgment.    The fact that a query is asked, does not imply that any particular answer is desired or expected.    Reviewed: additional documentation in the medical record   Thank You,  Erling Conte  RN BSN CCDS Certified Clinical Documentation Specialist: [place]

## 2013-11-01 NOTE — Clinical Documentation Improvement (Signed)
THIS DOCUMENT IS NOT A PERMANENT PART OF THE MEDICAL RECORD  Please update your documentation with the medical record to reflect your response to this query. If you need help knowing how to do this please call [phone].  11/01/13   Dr. Roxan Hockey and/or Associates,  In a better effort to capture your patient's severity of illness, reflect appropriate length of stay and utilization of resources, a review of the patient medical record has revealed the following information:   - Known history of recurrent right pleural effusion requiring thoracentesis   - Known history of CHF   - Known history of renal function impairment with Hypertension and Diabetes   - Documented "transudate" effusion   - Diagnostic operative sample results currently pending     Based on your clinical judgment, please document in the progress notes and discharge summary the Known, Probable or Suspected cause of the patient's recurrent right pleural effusion.    In responding to this query please exercise your independent judgment.    The fact that a query is asked, does not imply that any particular answer is desired or expected.   You may use possible, probable, or suspected with inpatient documentation.  However, possible, probable, or suspected diagnoses MUST be documented as such in the discharge summary unless ruled out or confirmed during the admission.  Reviewed: additional documentation in the medical record  Thank You,  Erling Conte  RN BSN CCDS Certified Clinical Documentation Specialist: [place]

## 2013-11-01 NOTE — Progress Notes (Signed)
Pt's urine output remained low throughout night (50-37mLs Q4 Hours via Foley). Bladder Scan indicated adequate emptying of bladder. In light of pt's CKD, MD not called, UOP conveyed to day RN to discuss with rounding MD. Pt appears in no acute distress. Will continue to monitor and assess.

## 2013-11-02 ENCOUNTER — Inpatient Hospital Stay (HOSPITAL_COMMUNITY): Payer: Medicare HMO

## 2013-11-02 LAB — COMPREHENSIVE METABOLIC PANEL
ALK PHOS: 67 U/L (ref 39–117)
ALT: 12 U/L (ref 0–53)
AST: 26 U/L (ref 0–37)
Albumin: 2.5 g/dL — ABNORMAL LOW (ref 3.5–5.2)
BILIRUBIN TOTAL: 0.7 mg/dL (ref 0.3–1.2)
BUN: 43 mg/dL — AB (ref 6–23)
CHLORIDE: 101 meq/L (ref 96–112)
CO2: 26 mEq/L (ref 19–32)
Calcium: 7.9 mg/dL — ABNORMAL LOW (ref 8.4–10.5)
Creatinine, Ser: 2.31 mg/dL — ABNORMAL HIGH (ref 0.50–1.35)
GFR calc non Af Amer: 26 mL/min — ABNORMAL LOW (ref >90)
GFR, EST AFRICAN AMERICAN: 30 mL/min — AB (ref >90)
GLUCOSE: 179 mg/dL — AB (ref 70–99)
Potassium: 4.3 mEq/L (ref 3.7–5.3)
Sodium: 139 mEq/L (ref 137–147)
Total Protein: 6.3 g/dL (ref 6.0–8.3)

## 2013-11-02 LAB — CBC
HEMATOCRIT: 31.3 % — AB (ref 39.0–52.0)
HEMOGLOBIN: 10.6 g/dL — AB (ref 13.0–17.0)
MCH: 34.3 pg — ABNORMAL HIGH (ref 26.0–34.0)
MCHC: 33.9 g/dL (ref 30.0–36.0)
MCV: 101.3 fL — AB (ref 78.0–100.0)
Platelets: 214 10*3/uL (ref 150–400)
RBC: 3.09 MIL/uL — ABNORMAL LOW (ref 4.22–5.81)
RDW: 13.4 % (ref 11.5–15.5)
WBC: 8.4 10*3/uL (ref 4.0–10.5)

## 2013-11-02 LAB — GLUCOSE, CAPILLARY
Glucose-Capillary: 166 mg/dL — ABNORMAL HIGH (ref 70–99)
Glucose-Capillary: 275 mg/dL — ABNORMAL HIGH (ref 70–99)
Glucose-Capillary: 176 mg/dL — ABNORMAL HIGH (ref 70–99)
Glucose-Capillary: 71 mg/dL (ref 70–99)

## 2013-11-02 MED ORDER — INSULIN DETEMIR 100 UNIT/ML ~~LOC~~ SOLN
35.0000 [IU] | Freq: Every day | SUBCUTANEOUS | Status: DC
  Administered 2013-11-02 – 2013-11-03 (×2): 35 [IU] via SUBCUTANEOUS
  Filled 2013-11-02 (×3): qty 0.35

## 2013-11-02 MED ORDER — SIMETHICONE 80 MG PO CHEW
80.0000 mg | CHEWABLE_TABLET | Freq: Four times a day (QID) | ORAL | Status: DC | PRN
  Administered 2013-11-04: 80 mg via ORAL
  Filled 2013-11-02 (×2): qty 1

## 2013-11-02 MED ORDER — LUBIPROSTONE 24 MCG PO CAPS
24.0000 ug | ORAL_CAPSULE | Freq: Every day | ORAL | Status: DC | PRN
  Administered 2013-11-03: 24 ug via ORAL
  Filled 2013-11-02 (×2): qty 1

## 2013-11-02 NOTE — Progress Notes (Signed)
[facility] DAILY ICU PROGRESS NOTE                   [address]            [place] [address]          [phone]   2 Days Post-Op Procedure(s) (LRB): VIDEO ASSISTED THORACOSCOPY (Right) DRAINAGE OF PLEURAL EFFUSION (Right) INSERTION PLEURAL DRAINAGE CATHETER (Right)  Total Length of Stay:  LOS: 2 days   Subjective: Feels bloated, mild discomforts, not SOB  Objective: Vital signs in last 24 hours: Temp:  [97.4 F (36.3 C)-98 F (36.7 C)] 98 F (36.7 C) (02/11 0742) Pulse Rate:  [83-100] 92 (02/11 0415) Cardiac Rhythm:  [-] Atrial fibrillation (02/11 0415) Resp:  [8-17] 15 (02/11 0415) BP: (120-149)/(50-71) 126/71 mmHg (02/11 0415) SpO2:  [97 %-100 %] 98 % (02/11 0415)  Filed Weights   10/31/13 1800  Weight: 220 lb 10.9 oz (100.1 kg)    Weight change:    Hemodynamic parameters for last 24 hours:    Intake/Output from previous day: 02/10 0701 - 02/11 0700 In: 1531.7 [P.O.:240; I.V.:1241.7; IV Piggyback:50] Out: 1015 [Urine:815; Chest Tube:200]  Intake/Output this shift:    Current Meds: Scheduled Meds: . aspirin EC  81 mg Oral Daily  . bisacodyl  10 mg Oral Daily  . Chlorhexidine Gluconate Cloth  6 each Topical Daily  . enoxaparin (LOVENOX) injection  40 mg Subcutaneous Q24H  . insulin aspart  0-5 Units Subcutaneous QHS  . insulin aspart  0-9 Units Subcutaneous TID WC  . insulin detemir  35 Units Subcutaneous Daily  . metoprolol  25 mg Oral BID  . mirtazapine  7.5 mg Oral QHS  . mupirocin ointment  1 application Nasal BID  . pantoprazole  40 mg Oral Daily  . simvastatin  20 mg Oral q1800   Continuous Infusions: . sodium chloride 20 mL/hr (11/02/13 0905)   PRN Meds:.LORazepam, lubiprostone, morphine injection, ondansetron (ZOFRAN) IV, oxyCODONE-acetaminophen, potassium chloride, prednisoLONE acetate, senna-docusate, simethicone, traMADol  General appearance: alert, cooperative and no distress Heart: irregularly irregular rhythm Lungs:  dim in dirht base Abdomen: mild dist, soft, nontender Extremities: venous stasis dermatitis noted and cool Wound: dressings intact  Lab Results: CBC: Recent Labs  11/01/13 0441 11/02/13 0520  WBC 11.1* 8.4  HGB 10.7* 10.6*  HCT 31.7* 31.3*  PLT 226 214   BMET:  Recent Labs  11/01/13 0441 11/02/13 0520  NA 141 139  K 4.8 4.3  CL 104 101  CO2 26 26  GLUCOSE 132* 179*  BUN 42* 43*  CREATININE 2.29* 2.31*  CALCIUM 8.1* 7.9*    PT/INR: No results found for this basename: LABPROT, INR,  in the last 72 hours Radiology: Dg Chest Port 1 View  11/02/2013   CLINICAL DATA:  Right pleural effusion.  EXAM: PORTABLE CHEST - 1 VIEW  COMPARISON:  11/01/2013 and 10/31/2013  FINDINGS: There has been re-accumulation of a small right pleural effusion with adjacent atelectasis.  There is a tiny right apical pneumothorax. Right chest tube and central venous catheter appear in good position.  There is chronic cardiomegaly. Pulmonary vascularity is normal. Left lung is clear.  IMPRESSION: 1. Re-accumulation of small right pleural effusion. 2. Tiny right apical pneumothorax.   Electronically Signed   By: Rozetta Nunnery M.D.   On: 11/02/2013 07:54   Dg Chest Port 1 View  11/01/2013   CLINICAL DATA:  Pneumothorax  EXAM: PORTABLE CHEST - 1 VIEW  COMPARISON:  October 31, 2013  FINDINGS: There is a persistent chest tube on the right. The previously noted pneumothorax inferolaterally on the right appears slightly smaller compared to 1 day prior. There is no tension component. There is patchy consolidation/atelectasis in the right base. There is soft tissue air on the right.  There is mild atelectasis in the left lower lobe. Left lung is otherwise clear. There is postoperative change on the left. Heart is enlarged with normal pulmonary vascularity. Patient is status post coronary artery bypass grafting. Central catheter tip is in the superior vena cava. No adenopathy.  IMPRESSION: Right-sided pneumothorax appears  slightly smaller compared to 1 day prior. Tube and catheter positions are unchanged. Persistent atelectasis/consolidation right base. Mild atelectasis on left. Stable cardiomegaly. Soft tissue air on the right again noted.   Electronically Signed   By: Lowella Grip M.D.   On: 11/01/2013 07:36   Dg Chest Portable 1 View  10/31/2013   CLINICAL DATA:  Postop chest tube and central venous catheter placement  EXAM: PORTABLE CHEST - 1 VIEW  COMPARISON:  DG CHEST 2 VIEW dated 10/31/2013  FINDINGS: A right chest tube is appreciated with tip projecting in the medial aspect of the right upper lobe. The patient's right pleural effusion has been evacuated when correlated with the previous study. A small loculated pneumothorax projects within the lateral right lung base. Surveillance evaluation is recommended to evaluate for resolution status post chest tube placement. A right-sided internal jugular catheter is appreciated with tip projecting in the region superior vena cava. Increased density projects within the aerated lung in the right lung base likely reflecting compressive atelectasis. Otherwise no focal regions of consolidation or focal infiltrates appreciated. The cardiac silhouette is enlarged and the patient is status post median sternotomy and coronary artery bypass grafting. The osseous structures unremarkable.  IMPRESSION: Complete resolution of the patient's right-sided pleural effusion consistent with recent chest tube placement. There is a residual loculated pneumothorax in the right lung base and surveillance evaluation is recommended. Patient is status post recent chest tube insertion.  Right-sided internal jugular catheter appears to be appropriately positioned.  Likely compressive atelectasis within the right lung base continued surveillance evaluation recommended.   Electronically Signed   By: Margaree Mackintosh M.D.   On: 10/31/2013 14:55     Assessment/Plan: S/P Procedure(s) (LRB): VIDEO ASSISTED  THORACOSCOPY (Right) DRAINAGE OF PLEURAL EFFUSION (Right) INSERTION PLEURAL DRAINAGE CATHETER (Right)  1 stable, CTube to H2O seal- no air leak noted 2 will need to determine plan for frequency of pleurx drainage 3 sugars somewhat elevated but poor appetite and some degree of ileus so will continue current rx 4 creat stable 5 H/H stable    GOLD,WAYNE E 11/02/2013 10:25 AM

## 2013-11-02 NOTE — Progress Notes (Signed)
Inpatient Diabetes Program Recommendations  AACE/ADA: New Consensus Statement on Inpatient Glycemic Control (2013)  Target Ranges:  Prepandial:   less than 140 mg/dL      Peak postprandial:   less than 180 mg/dL (1-2 hours)      Critically ill patients:  140 - 180 mg/dL   Reason for Visit: Hyperglycemia   Diabetes history: Type 2 Outpatient Diabetes medications: Lantus 40 units QD Current orders for Inpatient glycemic control: Lantus 35 units QD and Novolog sensitive tidwc   Results for Chris [name], Chris [name] (MRN [id number]) as of 11/02/2013 13:48  Ref. Range 11/01/2013 12:00 11/01/2013 16:26 11/01/2013 22:43 11/02/2013 07:46 11/02/2013 11:29  Glucose-Capillary Latest Range: 70-99 mg/dL 219 (H) 274 (H) 183 (H) 166 (H) 275 (H)   CBGs in am look good. Post-prandials are high.  Please consider Novolog 3 units tidwc if pt eats >50% meal. HgbA1C - 8.6% - will likely need adjustments to diabetes meds prior to discharge. Please change diet to CHO mod medium.  Will continue to follow. Thank you. Lorenda Peck, RD, LDN, CDE Inpatient Diabetes Coordinator [phone]

## 2013-11-03 ENCOUNTER — Inpatient Hospital Stay (HOSPITAL_COMMUNITY): Payer: Medicare HMO

## 2013-11-03 ENCOUNTER — Encounter (HOSPITAL_COMMUNITY): Payer: Self-pay | Admitting: Thoracic Surgery (Cardiothoracic Vascular Surgery)

## 2013-11-03 LAB — GLUCOSE, CAPILLARY
GLUCOSE-CAPILLARY: 59 mg/dL — AB (ref 70–99)
GLUCOSE-CAPILLARY: 357 mg/dL — AB (ref 70–99)
Glucose-Capillary: 91 mg/dL (ref 70–99)
Glucose-Capillary: 63 mg/dL — ABNORMAL LOW (ref 70–99)
Glucose-Capillary: 95 mg/dL (ref 70–99)
Glucose-Capillary: 157 mg/dL — ABNORMAL HIGH (ref 70–99)
Glucose-Capillary: 173 mg/dL — ABNORMAL HIGH (ref 70–99)
Glucose-Capillary: 67 mg/dL — ABNORMAL LOW (ref 70–99)

## 2013-11-03 LAB — CBC
HCT: 30.8 % — ABNORMAL LOW (ref 39.0–52.0)
HEMOGLOBIN: 10.6 g/dL — AB (ref 13.0–17.0)
MCH: 34.5 pg — ABNORMAL HIGH (ref 26.0–34.0)
MCHC: 34.4 g/dL (ref 30.0–36.0)
MCV: 100.3 fL — ABNORMAL HIGH (ref 78.0–100.0)
Platelets: 229 10*3/uL (ref 150–400)
RBC: 3.07 MIL/uL — AB (ref 4.22–5.81)
RDW: 13.1 % (ref 11.5–15.5)
WBC: 9.4 10*3/uL (ref 4.0–10.5)

## 2013-11-03 LAB — BASIC METABOLIC PANEL
BUN: 37 mg/dL — ABNORMAL HIGH (ref 6–23)
CO2: 26 meq/L (ref 19–32)
Calcium: 8.4 mg/dL (ref 8.4–10.5)
Chloride: 104 mEq/L (ref 96–112)
Creatinine, Ser: 1.98 mg/dL — ABNORMAL HIGH (ref 0.50–1.35)
GFR calc Af Amer: 37 mL/min — ABNORMAL LOW (ref >90)
GFR, EST NON AFRICAN AMERICAN: 32 mL/min — AB (ref >90)
GLUCOSE: 49 mg/dL — AB (ref 70–99)
Potassium: 4 mEq/L (ref 3.7–5.3)
SODIUM: 142 meq/L (ref 137–147)

## 2013-11-03 LAB — BODY FLUID CULTURE: CULTURE: NO GROWTH

## 2013-11-03 MED ORDER — INSULIN ASPART 100 UNIT/ML ~~LOC~~ SOLN: 0.0000 [IU] | Freq: Three times a day (TID) | SUBCUTANEOUS | Status: DC

## 2013-11-03 MED ORDER — INSULIN ASPART 100 UNIT/ML ~~LOC~~ SOLN
0.0000 [IU] | Freq: Three times a day (TID) | SUBCUTANEOUS | Status: DC
  Administered 2013-11-03: 9 [IU] via SUBCUTANEOUS
  Administered 2013-11-03 (×2): 2 [IU] via SUBCUTANEOUS
  Administered 2013-11-04: 5 [IU] via SUBCUTANEOUS
  Administered 2013-11-04: 1 [IU] via SUBCUTANEOUS

## 2013-11-03 NOTE — Progress Notes (Signed)
Hypoglycemic Event  CBG: blood work 49, POCT 59   Treatment: 15 GM carbohydrate snack  Symptoms: None  Follow-up CBG: Time:0617 CBG Result:63  Possible Reasons for Event: Other: Patient states he "usually bottoms out at night"  Comments/MD notified:Another 15 GM carb snack given, rechecked CBG at 0640 and was 95, will continue to monitor    Chris [name] A  Remember to initiate Hypoglycemia Order Set & complete

## 2013-11-03 NOTE — Progress Notes (Addendum)
[facility] DAILY ICU PROGRESS NOTE                   [address]            [place] [address]          [phone]   3 Days Post-Op Procedure(s) (LRB): VIDEO ASSISTED THORACOSCOPY (Right) DRAINAGE OF PLEURAL EFFUSION (Right) INSERTION PLEURAL DRAINAGE CATHETER (Right)  Total Length of Stay:  LOS: 3 days   Subjective: Feels ok, no new C/O  Objective: Vital signs in last 24 hours: Temp:  [97.7 F (36.5 C)-98.7 F (37.1 C)] 98.2 F (36.8 C) (02/12 0745) Pulse Rate:  [81-107] 103 (02/12 0745) Cardiac Rhythm:  [-] Atrial fibrillation (02/12 0745) Resp:  [14-22] 18 (02/12 0745) BP: (126-144)/(45-65) 138/59 mmHg (02/12 0745) SpO2:  [97 %-100 %] 100 % (02/12 0745)  Filed Weights   10/31/13 1800  Weight: 220 lb 10.9 oz (100.1 kg)    Weight change:    Hemodynamic parameters for last 24 hours:    Intake/Output from previous day: 02/11 0701 - 02/12 0700 In: 900 [P.O.:480; I.V.:420] Out: 700 [Urine:550; Chest Tube:150]  Intake/Output this shift: Total I/O In: 20 [I.V.:20] Out: 220 [Urine:200; Chest Tube:20]  Current Meds: Scheduled Meds: . aspirin EC  81 mg Oral Daily  . bisacodyl  10 mg Oral Daily  . Chlorhexidine Gluconate Cloth  6 each Topical Daily  . enoxaparin (LOVENOX) injection  40 mg Subcutaneous Q24H  . insulin aspart  0-5 Units Subcutaneous QHS  . insulin aspart  0-9 Units Subcutaneous TID WC  . insulin detemir  35 Units Subcutaneous Daily  . metoprolol  25 mg Oral BID  . mirtazapine  7.5 mg Oral QHS  . mupirocin ointment  1 application Nasal BID  . pantoprazole  40 mg Oral Daily  . simvastatin  20 mg Oral q1800   Continuous Infusions: . sodium chloride 20 mL/hr (11/02/13 0905)   PRN Meds:.LORazepam, lubiprostone, morphine injection, ondansetron (ZOFRAN) IV, oxyCODONE-acetaminophen, potassium chloride, prednisoLONE acetate, senna-docusate, simethicone, traMADol  General appearance: alert, cooperative and no distress Heart: irregularly  irregular rhythm Lungs: crackles in right base Abdomen: benign Wound: dressings CDI  Lab Results: CBC: Recent Labs  11/02/13 0520 11/03/13 0425  WBC 8.4 9.4  HGB 10.6* 10.6*  HCT 31.3* 30.8*  PLT 214 229   BMET:  Recent Labs  11/02/13 0520 11/03/13 0425  NA 139 142  K 4.3 4.0  CL 101 104  CO2 26 26  GLUCOSE 179* 49*  BUN 43* 37*  CREATININE 2.31* 1.98*  CALCIUM 7.9* 8.4    PT/INR: No results found for this basename: LABPROT, INR,  in the last 72 hours Radiology: Dg Chest Port 1 View  11/03/2013   CLINICAL DATA:  Effusion.  EXAM: PORTABLE CHEST - 1 VIEW  COMPARISON:  Chest x-ray from yesterday  FINDINGS: Right IJ catheter in stable position, tip at the level of the distal SVC. Right basilar chest tube is in unchanged position.  Unchanged cardiopericardial enlargement. CABG changes again noted. Hazy right base density consistent with effusion and atelectasis. Small (less than 10%) right apical pneumothorax is slightly greater than before, which may be related to differences in inspiration. The left lung is well aerated.  IMPRESSION: 1. No significant change in a small right apical pneumothorax. 2. Unchanged small right effusion with atelectasis.   Electronically Signed   By: Jorje Guild M.D.   On: 11/03/2013 08:01   Dg Chest [facility] [address]  11/02/2013   CLINICAL DATA:  Right pleural effusion.  EXAM: PORTABLE CHEST - 1 VIEW  COMPARISON:  11/01/2013 and 10/31/2013  FINDINGS: There has been re-accumulation of a small right pleural effusion with adjacent atelectasis.  There is a tiny right apical pneumothorax. Right chest tube and central venous catheter appear in good position.  There is chronic cardiomegaly. Pulmonary vascularity is normal. Left lung is clear.  IMPRESSION: 1. Re-accumulation of small right pleural effusion. 2. Tiny right apical pneumothorax.   Electronically Signed   By: Rozetta Nunnery M.D.   On: 11/02/2013 07:54     Assessment/Plan: S/P Procedure(s) (LRB): VIDEO  ASSISTED THORACOSCOPY (Right) DRAINAGE OF PLEURAL EFFUSION (Right) INSERTION PLEURAL DRAINAGE CATHETER (Right)  1 small pntx is stable on H20 seal-prob d/c chest tube soon 2 ileus is improved- on his amitiza, less bloated , + flatus 3 sugars stable 4 d/c central line   GOLD,WAYNE E 11/03/2013 8:55 AM   Minimal drainage from CT- will dc CT Start pleur-x catheter drainage tomorrow Talked to Dr. Donato Heinz in Pathology- no definitive malignancy but concerned about possibility of lymphoma- requested pleural fluid for cytology- will send pleural fluid tomorrow from pleur-x drainage

## 2013-11-03 NOTE — Progress Notes (Signed)
Inpatient Diabetes Program Recommendations  AACE/ADA: New Consensus Statement on Inpatient Glycemic Control (2013)  Target Ranges:  Prepandial:   less than 140 mg/dL      Peak postprandial:   less than 180 mg/dL (1-2 hours)      Critically ill patients:  140 - 180 mg/dL   Reason for Visit: Hypoglycemia and Hyperglycemia  Results for Chris [name], Chris [name] (MRN [id number]) as of 11/03/2013 13:07  Ref. Range 11/03/2013 00:12 11/03/2013 05:55 11/03/2013 06:17 11/03/2013 06:40 11/03/2013 07:50 11/03/2013 11:48  Glucose-Capillary Latest Range: 70-99 mg/dL 91 59 (L) 63 (L) 95 157 (H) 357 (H)   Post-prandial blood sugars high.  Inpatient Diabetes Program Recommendations Insulin - Basal: Decrease Lantus to 30 units QAM Insulin - Meal Coverage: Please consider addition of Novolog 4 units tidwc if pt eats >50% meal  Note: Will continue to follow. Thank you. Lorenda Peck, RD, LDN, CDE Inpatient Diabetes Coordinator [phone]

## 2013-11-03 NOTE — Progress Notes (Signed)
Patient evaluated for community based chronic disease management services with [facility] as a benefit of patient's [employer]. Spoke with patient at bedside to explain [facility] services.  History of present illness:  73 year old male with history of persistent right pleural effusion, C. KD, diabetes mellitus, hypertension who was just discharged 2.8.15 from [facility], came back to the ED on 2.9.15 with inability to pass urine for one day. Patient was seen by CT surgery and recommended to undergo VATS procedure, but patient refused to undergo VATS and instead underwent ultrasound-guided thoracentesis on 1/15. Post procedure patient was found to be hypotensive but as patient's blood pressure improved he was discharged home. As per patient he has not been able to urinate since yesterday, in the ED today patient's labs reveal acute kidney injury with creatinine of 4.97 which is up from his baseline 2.05 as of 1/12.  Medical diagnoses include diabetes, CAD, afib.  He is not a candidate for long term anticoagulation.  Patient had one episode of vomiting yesterday, No diarrhea. He has intermittent chest pain at the site of pleural effusion, denies fever no dysuria urgency or frequency of urination. Foley catheter was placed with very minimal urinary output.  Written consents and alternate contacts received.  Patient will receive a post discharge transition of care call and will be evaluated for monthly home visits for assessments and disease process education.  Left contact information and [facility] literature at bedside. Made Inpatient Case Manager aware that [place] following. Of note, [facility] Care Management services does not replace or interfere with any services that are arranged by inpatient case management or social work.  For additional questions or referrals please contact Corliss Blacker BSN RN [facility] Liaison at [phone].

## 2013-11-04 ENCOUNTER — Inpatient Hospital Stay (HOSPITAL_COMMUNITY): Payer: Medicare HMO

## 2013-11-04 LAB — GLUCOSE, CAPILLARY
GLUCOSE-CAPILLARY: 73 mg/dL (ref 70–99)
GLUCOSE-CAPILLARY: 144 mg/dL — AB (ref 70–99)
Glucose-Capillary: 52 mg/dL — ABNORMAL LOW (ref 70–99)
Glucose-Capillary: 54 mg/dL — ABNORMAL LOW (ref 70–99)
Glucose-Capillary: 65 mg/dL — ABNORMAL LOW (ref 70–99)
Glucose-Capillary: 81 mg/dL (ref 70–99)
Glucose-Capillary: 122 mg/dL — ABNORMAL HIGH (ref 70–99)
Glucose-Capillary: 58 mg/dL — ABNORMAL LOW (ref 70–99)
Glucose-Capillary: 281 mg/dL — ABNORMAL HIGH (ref 70–99)

## 2013-11-04 MED ORDER — OXYCODONE-ACETAMINOPHEN 5-325 MG PO TABS: 1.0000 | ORAL_TABLET | ORAL | Status: DC | PRN

## 2013-11-04 MED ORDER — INSULIN DETEMIR 100 UNIT/ML ~~LOC~~ SOLN
20.0000 [IU] | Freq: Every day | SUBCUTANEOUS | Status: DC
  Administered 2013-11-04: 20 [IU] via SUBCUTANEOUS
  Filled 2013-11-04: qty 0.2

## 2013-11-04 MED ORDER — INSULIN DETEMIR 100 UNIT/ML ~~LOC~~ SOLN
30.0000 [IU] | Freq: Every day | SUBCUTANEOUS | Status: DC
  Filled 2013-11-04: qty 0.3

## 2013-11-04 MED ORDER — INSULIN DETEMIR 100 UNIT/ML FLEXPEN: 20.0000 [IU] | PEN_INJECTOR | Freq: Every day | SUBCUTANEOUS | Status: DC

## 2013-11-04 NOTE — Progress Notes (Signed)
Hypoglycemic Event  CBG: 65  Treatment: pt refused to eat a carb snack or drink, or any oral medications  Symptoms: None  Follow-up CBG: Time:2241 CBG Result:73  Possible Reasons for Event: Inadequate meal intake  Comments/MD notified: recurrent issue   Educated patient on necessity to eat a carb snack to increase blood sugar, preferably with protein. Also educated about the symptoms of low blood sugar and how to avoid by eating his meals. Updated patient's wife that patient is refusing to eat and take any form of medicine to increase blood sugar. Wife spoke with patient on phone and stated that she will be coming to spend the night. Will continue to monitor.  Remember to initiate Hypoglycemia Order Set & complete

## 2013-11-04 NOTE — Progress Notes (Signed)
Hypoglycemic Event  CBG: 67  Treatment: 15 GM carbohydrate snack  Symptoms: None  Follow-up CBG: Time:2157 CBG Result:65  Possible Reasons for Event: Inadequate meal intake  Comments/MD notified:recurrent issue    Remember to initiate Hypoglycemia Order Set & complete

## 2013-11-04 NOTE — Discharge Summary (Signed)
[address]       [place] [address]             [phone]              Discharge Summary  Name: Chris [name] DOB: [birth year] 73 y.o. MRN: [id number]   Admission Date: 10/31/2013 Discharge Date: 11/04/2013    Admitting Diagnosis: Recurrent right pleural effusion    Discharge Diagnosis:  Recurrent right pleural effusion   Past Medical History  Diagnosis Date  . 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 Diabetes mellitus   . Macrocytosis     normal B12 & folate levels  . Ischemic cardiomyopathy     EF of 45-50% - posterior & inferior walls are severly hypokinetic  . Atrial fibrillation     not candidate for chronic anticoagulation with Coumadin given chrronic epistaxis as well as hemorrhagic conversion of his acute cva   . Hyperlipidemia   . Hypertension   . Complication of anesthesia   . PONV (postoperative nausea and vomiting)   . Acute renal insufficiency     resolved  . Myocardial infarct   . CHF (congestive heart failure)   . Shortness of breath   . Pleural effusion 10/05/2013     Procedures: RIGHT VIDEO ASSISTED THORACOSCOPY  - 10/31/2013  DRAINAGE OF PLEURAL EFFUSION  PLEURAL BIOPSY  RIGHT LOWER LOBE BIOPSY  INSERTION PLEURAL DRAINAGE CATHETER     HPI:  The patient is a 73 y.o. male who was referred to Dr. Roxan Hockey for evaluation of recurrent right pleural effusions.  On July 23, 2013,  he was admitted after presenting with complaints of abdominal discomfort. A CT of the abdomen showed a right pleural effusion, dependent atelectasis, and possibly an underlying community-acquired pneumonia. During the hospitalization, he had a thoracentesis which showed serous fluid with a mildly elevated LDH at 89. Cytology showed mostly reactive mesothelial cells, but there were a few atypical cells which could not be further characterized. He was treated  presumptively for pneumonia.   The effusion recurred. He was seen by Dr. Roxan Hockey in December and it was recommended that he undergo a right VATS to  drain the effusion and to obtain pleural biopsies. He refused at that time, and another thoracentesis was performed.    He missed his next followup appointment, and ended up in the emergency room at [facility] with a recurrent effusion. He was transferred to [facility]. He again refused surgery during that admission. A repeat thoracentesis was done. He had some hypotension around the time of thoracentesis. He was discharged from [facility] and then ended up in the emergency room at [facility] 6 hours later unable to void. He was admitted with acute on chronic renal failure.   He returned for outpatient followup, and was found to have a recurrent effusion on chest x-ray.  At that time, it was again recommended that he undergo a right VATS to drain the effusion, and possibly have a PleuRx catheter placed. All risks, benefits and alternatives of surgery were explained in detail, and the patient agreed to proceed.      Hospital Course:  The patient was admitted to [facility] on 10/31/2013. The patient was taken to the operating room and underwent the above procedure.  Approximately 3000 ml of serous fluid was drained.  He tolerated the procedure well.   The postoperative course has generally been uneventful.  His chest tube was removed in the standard fashion on 11/03/2013 and the PleuRx catheter was drained the following day.  His chest x-rays have remained stable, with a persistent right pleural effusion.  Incisions are healing well.  His po intake has been decreased, and his blood sugars have been running low.  Because of this, his insulin dose has been decreased.  He is back on his home GI medicines, and is having normal bowel function.  He is ambulating in the halls without difficulty.  Surgical pathology did not show a definite malignancy, but the  pathologist is concerned about the possibility of lymphoma.  Additional pleural fluid has been sent for flow cytometry, and results are not yet available.  The patient has overall done well postoperatively and is medically stable for discharge home. We will arrange home health to assist with draining his PleuRx catheter every 3 days.   Pathology raised the question of possible lymphoma. Additional pleural fluid was sent for flow cytometry    Recent vital signs:  Filed Vitals:   11/04/13 0716  BP: 128/57  Pulse: 92  Temp: 98.6 F (37 C)  Resp: 24    Recent laboratory studies:  CBC: Recent Labs  11/02/13 0520 11/03/13 0425  WBC 8.4 9.4  HGB 10.6* 10.6*  HCT 31.3* 30.8*  PLT 214 229   BMET:  Recent Labs  11/02/13 0520 11/03/13 0425  NA 139 142  K 4.3 4.0  CL 101 104  CO2 26 26  GLUCOSE 179* 49*  BUN 43* 37*  CREATININE 2.31* 1.98*  CALCIUM 7.9* 8.4    PT/INR: No results found for this basename: LABPROT, INR,  in the last 72 hours    Discharge Medications:     Medication List         aspirin EC 81 MG tablet  Take 81 mg by mouth daily.     furosemide 40 MG tablet  Commonly known as:  LASIX  Take 0.5 tablets (20 mg total) by mouth 2 (two) times daily.     Insulin Detemir 100 UNIT/ML Pen  Commonly known as:  LEVEMIR FLEXPEN  Inject 20 Units into the skin daily.     LORazepam 0.5 MG tablet  Commonly known as:  ATIVAN  Take 0.5 mg by mouth 2 (two) times daily as needed for anxiety.     lovastatin 40 MG tablet  Commonly known as:  MEVACOR  Take 40 mg by mouth at bedtime.     lubiprostone 24 MCG capsule  Commonly known as:  AMITIZA  Take 24 mcg by mouth daily as needed for constipation.     Magnesium 250 MG Tabs  Take 250 mg by mouth daily. OTC     metoprolol 50 MG tablet  Commonly known as:  LOPRESSOR  Take 50 mg by mouth 2 (two) times daily.     mirtazapine 7.5 MG tablet  Commonly known as:  REMERON  Take 1 tablet (7.5 mg total) by mouth at  bedtime.     oxyCODONE-acetaminophen 5-325 MG per tablet  Commonly known as:  PERCOCET/ROXICET  Take 1-2 tablets by mouth every 4 (four) hours as needed for severe pain.     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     traMADol 50 MG tablet  Commonly known as:  ULTRAM  Take 50 mg by mouth every 12 (  twelve) hours as needed for severe pain.     Vitamin D-3 5000 UNITS Tabs  Take 5,000 Units by mouth daily.          Discharge Instructions:  The patient is to refrain from driving, heavy lifting or strenuous activity.  May shower daily and clean incisions with soap and water.  May resume regular diet.   Follow Up: Follow-up Information   Follow up with Melrose Nakayama, MD On 11/22/2013. (Have a chest x-ray at [facility] at 11:30, then see MD at 12:30)    Specialty:  Cardiothoracic Surgery   Contact information:   [place] [address] [phone]            COLLINS,GINA H 11/04/2013, 10:24 AM

## 2013-11-04 NOTE — Progress Notes (Signed)
Hypoglycemic Event  CBG: 52  Treatment: 15 GM carbohydrate snack  Symptoms: None  Follow-up CBG: Time:0136 CBG Result:81  Possible Reasons for Event: Inadequate meal intake  Comments/MD notified: recurrent issue     Remember to initiate Hypoglycemia Order Set & complete

## 2013-11-04 NOTE — Progress Notes (Signed)
Hypoglycemic Event  CBG: 54  Treatment: patient would only eat a small candy bar  Symptoms: None  Follow-up CBG: Time:0102 CBG Result:52  Possible Reasons for Event: Inadequate meal intake  Comments/MD notified: recurrent issue   Patient's wife at bedside. Educated about the necessity of patient to eat a carb/protein snack to increase blood sugar. Wife understood and convinced patient to cooperate with treatment.  Remember to initiate Hypoglycemia Order Set & complete

## 2013-11-04 NOTE — Progress Notes (Signed)
Pleur X catheter drained using hospital protocol, 120ml sss drained, pt tolerated w/o event  Candyce Churn

## 2013-11-04 NOTE — Progress Notes (Signed)
Discharge instructions given to wife with teach-back. Patient without complaints. PleurX kits given to family.  Discharged home.

## 2013-11-04 NOTE — Discharge Instructions (Signed)
HH-RN arranged with [facility] [phone] (PleurX forms have been faxed to [employer] for drainage kit supplies)

## 2013-11-04 NOTE — Progress Notes (Addendum)
       [address]       [place] [address]             [phone]          4 Days Post-Op Procedure(s) (LRB): VIDEO ASSISTED THORACOSCOPY (Right) DRAINAGE OF PLEURAL EFFUSION (Right) INSERTION PLEURAL DRAINAGE CATHETER (Right)  Subjective: Feels better this am.  +BM.  Pain controlled. Breathing stable.   Objective: Vital signs in last 24 hours: Patient Vitals for the past 24 hrs:  BP Temp Temp src Pulse Resp SpO2  11/04/13 0716 128/57 mmHg 98.6 F (37 C) Oral 92 24 100 %  11/04/13 0400 154/68 mmHg 97.7 F (36.5 C) Oral 85 20 97 %  11/04/13 0000 147/59 mmHg 98.7 F (37.1 C) Oral 103 23 100 %  11/03/13 2000 137/54 mmHg 98.7 F (37.1 C) Oral 115 18 97 %  11/03/13 1525 - 98.5 F (36.9 C) Oral - - -  11/03/13 1147 143/63 mmHg 98.2 F (36.8 C) Oral 100 26 97 %  11/03/13 0941 136/49 mmHg - - 103 15 100 %   Current Weight  10/31/13 220 lb 10.9 oz (100.1 kg)     Intake/Output from previous day: 02/12 0701 - 02/13 0700 In: 48 [P.O.:720; I.V.:100] Out: 871 [Urine:850; Stool:1; Chest Tube:20]  CBGs 65-73-58-54-81-122    PHYSICAL EXAM:  Heart: RRR Lungs: Clear Wound: Clean and dry     Lab Results: CBC: Recent Labs  11/02/13 0520 11/03/13 0425  WBC 8.4 9.4  HGB 10.6* 10.6*  HCT 31.3* 30.8*  PLT 214 229   BMET:  Recent Labs  11/02/13 0520 11/03/13 0425  NA 139 142  K 4.3 4.0  CL 101 104  CO2 26 26  GLUCOSE 179* 49*  BUN 43* 37*  CREATININE 2.31* 1.98*  CALCIUM 7.9* 8.4    PT/INR: No results found for this basename: LABPROT, INR,  in the last 72 hours    CXR: FINDINGS:  Cardiac shadow is stable. Postoperative changes are again seen.  Persistent right-sided effusion is noted. A small pneumothorax is  noted in the apex. PleurX catheter is noted overlying the right lung  base. The left lung is clear.  IMPRESSION:  Right-sided pleural effusion with PleurX catheter in place. A small  right pneumothorax is noted. The left  lung remains clear.   Assessment/Plan: S/P Procedure(s) (LRB): VIDEO ASSISTED THORACOSCOPY (Right) DRAINAGE OF PLEURAL EFFUSION (Right) INSERTION PLEURAL DRAINAGE CATHETER (Right) CXR stable. Will start draining PleuRx today. DM- not eating much, sugars low.  Will decrease Levemir. GI- back on home meds, bowels working.   Continue current care.  Hopefully home soon, possibly later today vs in am.  Will need home health to assist with PleuRx drainage.   LOS: 4 days    COLLINS,GINA H 11/04/2013  Patient seen and examined, agree with above. He is anxious to go home  I talked with Mr and Mrs Bartelt about the pathology results. They understand that the pathologist was concerned this could be lymphoma, but we do not know definitively one way or the other.  Will drain pleur-x this AM. Send fluid for flow cytometry.  If he tolerates that OK will dc later today, with home health for drainage of catheter every third day

## 2013-11-05 LAB — ANAEROBIC CULTURE

## 2013-11-18 ENCOUNTER — Other Ambulatory Visit: Payer: Self-pay | Admitting: *Deleted

## 2013-11-18 DIAGNOSIS — J90 Pleural effusion, not elsewhere classified: Secondary | ICD-10-CM

## 2013-11-22 ENCOUNTER — Ambulatory Visit (INDEPENDENT_AMBULATORY_CARE_PROVIDER_SITE_OTHER): Payer: Commercial Managed Care - HMO | Admitting: Thoracic Surgery (Cardiothoracic Vascular Surgery)

## 2013-11-22 ENCOUNTER — Ambulatory Visit
Admission: RE | Admit: 2013-11-22 | Discharge: 2013-11-22 | Disposition: A | Discharge status: Home / Self Care | Payer: Commercial Managed Care - HMO | Source: Ambulatory Visit | Attending: Thoracic Surgery (Cardiothoracic Vascular Surgery) | Admitting: Thoracic Surgery (Cardiothoracic Vascular Surgery)

## 2013-11-22 ENCOUNTER — Encounter: Payer: Self-pay | Admitting: Thoracic Surgery (Cardiothoracic Vascular Surgery)

## 2013-11-22 VITALS — BP 157/79 | HR 95 | Resp 16 | Ht 74.0 in | Wt 217.0 lb

## 2013-11-22 DIAGNOSIS — J90 Pleural effusion, not elsewhere classified: Secondary | ICD-10-CM

## 2013-11-22 DIAGNOSIS — Z09 Encounter for follow-up examination after completed treatment for conditions other than malignant neoplasm: Secondary | ICD-10-CM

## 2013-11-22 NOTE — Progress Notes (Signed)
HPI:  Chris [name] returns for a scheduled postoperative followup visit.  He had a right thoracoscopic drainage of a pleural effusion and placement of a Pleurx catheter on 10/31/2013. He had a large effusion. His lung was badly trapped. We were able to get it mostly reexpanded however the lower and middle lobe quite not quite fill the pleural space. He was discharged home on postoperative day #4.  He has been draining his Pleurx catheter every third day since then. There has been minimal (less than 100 mL) drainage each time.   Past Medical History  Diagnosis Date  . 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 Diabetes mellitus   . Macrocytosis     normal B12 & folate levels  . Ischemic cardiomyopathy     EF of 45-50% - posterior & inferior walls are severly hypokinetic  . Atrial fibrillation     not candidate for chronic anticoagulation with Coumadin given chrronic epistaxis as well as hemorrhagic conversion of his acute cva   . Hyperlipidemia   . Hypertension   . Complication of anesthesia   . PONV (postoperative nausea and vomiting)   . Acute renal insufficiency     resolved  . Myocardial infarct   . CHF (congestive heart failure)   . Shortness of breath   . Pleural effusion 10/05/2013     Current Outpatient Prescriptions  Medication Sig Dispense Refill  . aspirin EC 81 MG tablet Take 81 mg by mouth daily.      . Cholecalciferol (VITAMIN D-3) 5000 UNITS TABS Take 5,000 Units by mouth daily.       . furosemide (LASIX) 40 MG tablet Take 0.5 tablets (20 mg total) by mouth 2 (two) times daily.  30 tablet  1  . Insulin Detemir (LEVEMIR FLEXPEN) 100 UNIT/ML Pen Inject 20 Units into the skin daily.  2 pen  2  . LORazepam (ATIVAN) 0.5 MG tablet Take 0.5 mg by mouth 2 (two) times daily as needed for anxiety.      . lovastatin (MEVACOR) 40 MG tablet Take 40 mg by mouth at bedtime.        Marland Kitchen lubiprostone (AMITIZA) 24  MCG capsule Take 24 mcg by mouth daily as needed for constipation.      . Magnesium 250 MG TABS Take 250 mg by mouth daily. OTC      . metoprolol (LOPRESSOR) 50 MG tablet Take 50 mg by mouth 2 (two) times daily.      . mirtazapine (REMERON) 7.5 MG tablet Take 1 tablet (7.5 mg total) by mouth at bedtime.  30 tablet  0  . 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      Marland Kitchen oxyCODONE-acetaminophen (PERCOCET/ROXICET) 5-325 MG per tablet Take 1-2 tablets by mouth every 4 (four) hours as needed for severe pain.  30 tablet  0  . traMADol (ULTRAM) 50 MG tablet Take 50 mg by mouth every 12 (twelve) hours as needed for severe pain.       No current facility-administered medications for this visit.    Physical Exam  Diagnostic Tests: Chest x-ray 11/22/2013 CHEST 2 VIEW  COMPARISON: DG CHEST 2V dated 11/12/2013  FINDINGS:  Sternotomy wires overlie stable enlarged cardiac silhouette. There  is a moderate right pleural effusion not changed from prior. There  is right basilar atelectasis.  IMPRESSION:  No change in right pleural effusion atelectasis.  Electronically Signed  By: Suzy Bouchard M.D.  On: 11/22/2013 12:08   Impression: 73 year old man with a recurrent right pleural effusion. After multiple tries he finally agreed to have a right VATS to drain the effusion and place a Pleurx catheter. I am very pleased with the result. He does still have some residual entrapment of the lower and middle lobes, but overall there is good reexpansion. His breathing has improved dramatically.  His primary complaint remains abdominal discomfort with bloating and pain.  He is having minimal drainage from his Pleurx catheter. We are going to stop the routine drainages. He knows to call home health to come drain the catheter if he develops shortness of breath. I will plan to see him back in 3 weeks. If  there still no drainage at that time and his x-ray is stable we will remove the catheter in the office.  There was chronic inflammation and the suggestion that there might be chest wall lymphoma on pathology. He has never drained enough from the Pleurx catheter to send fluid for flow cytometry. I strongly suspect that if there was a lymphoma, that he would have much more drainage and/or a recurrence of his effusion. However, we will need to keep this in mind. If there is no recurrence of the effusion over the next 3 weeks and we're able to remove the catheter, we'll plan to scan him again in about 3 months to see if there any suspicious findings  Plan: Return in 3 weeks for Pleurx catheter removal.

## 2013-11-25 LAB — FUNGUS CULTURE W SMEAR: FUNGAL SMEAR: NONE SEEN

## 2013-11-28 ENCOUNTER — Telehealth: Payer: Self-pay | Admitting: Internal Medicine

## 2013-11-28 NOTE — Telephone Encounter (Signed)
Pt's wife called to make OV with [facility]. She agreed for OV with [facility] on 4/1 but after I got a cancellation for Thursday this week with [facility] I called pt's wife back to move it up to 12/01/13. Wife is concerned about the stomach problems the patient is having and would like to speak with the nurse. [phone]

## 2013-12-01 ENCOUNTER — Other Ambulatory Visit: Payer: Self-pay | Admitting: *Deleted

## 2013-12-01 ENCOUNTER — Encounter (INDEPENDENT_AMBULATORY_CARE_PROVIDER_SITE_OTHER): Payer: Self-pay

## 2013-12-01 ENCOUNTER — Encounter: Payer: Self-pay | Admitting: Gastroenterology

## 2013-12-01 ENCOUNTER — Ambulatory Visit (INDEPENDENT_AMBULATORY_CARE_PROVIDER_SITE_OTHER): Payer: Medicare HMO | Admitting: Gastroenterology

## 2013-12-01 VITALS — BP 120/55 | HR 89 | Temp 98.4°F | Wt 207.6 lb

## 2013-12-01 DIAGNOSIS — K3189 Other diseases of stomach and duodenum: Secondary | ICD-10-CM

## 2013-12-01 DIAGNOSIS — J90 Pleural effusion, not elsewhere classified: Secondary | ICD-10-CM

## 2013-12-01 DIAGNOSIS — K5900 Constipation, unspecified: Secondary | ICD-10-CM

## 2013-12-01 DIAGNOSIS — R1013 Epigastric pain: Secondary | ICD-10-CM | POA: Insufficient documentation

## 2013-12-01 MED ORDER — LUBIPROSTONE 8 MCG PO CAPS: 8.0000 ug | ORAL_CAPSULE | Freq: Two times a day (BID) | ORAL | Status: DC

## 2013-12-01 MED ORDER — ONDANSETRON HCL 4 MG PO TABS: 4.0000 mg | ORAL_TABLET | Freq: Three times a day (TID) | ORAL | Status: DC | PRN

## 2013-12-01 MED ORDER — PANTOPRAZOLE SODIUM 40 MG PO TBEC: 40.0000 mg | DELAYED_RELEASE_TABLET | Freq: Two times a day (BID) | ORAL | Status: DC

## 2013-12-01 NOTE — Progress Notes (Signed)
cc'd to pcp 

## 2013-12-01 NOTE — Progress Notes (Signed)
Referring Provider: Richardean Chimera, MD Primary Care Physician:  Donzetta Sprung, MD Primary GI: Dr. Jena Gauss   Chief Complaint  Patient presents with  . Abdominal Pain    HPI:   Chris [name] presents today in follow-up with a history of chronic dyspepsia, bloating, constipation. CT angiogram negative for mesenteric ischemia in recent past. EGD on file from last year with dilation of Schatzki's ring. Colonoscopy April 2014 with multiple tubular adenomas and due for surveillance April 2017. He has had multiple ED visits in the past for symptoms. Historically on Aciphex 20 mg daily. GES normal. Prior course of Reglan without improvement. Last seen in the office March 2014.   CT during hospitalization in Nov 2014 showed distal gastric antrum thickening, question reflecting gastritis. Repeat CT Jan 2015 with thickening of mid duodenum that dates back to 2009, likely redundant folds. He had a right thoracoscopic drainage of a pleural effusion and placement of a Pleurx catheter on 10/31/2013. Sees Dr. Dorris Fetch.   Ativan has helped with stomach discomfort at times. Not taking daily. Takes Amitiza 24 mcg "every now and then". Takes just once a day every other day. Unable to tolerate every day. Stopped taking Aciphex last year at some point. Prescribed Protonix during hospitalization in [place]. Started taking routinely on this Sunday.   Feels bloated in abdomen, diffusely. Has to keep moving to get something to "work". Notes belching. Sits up on side of bed to burp. Notes recurrent symptoms since June/July. Usually nocturnal. Decreased appetite.  Taking Amitiza before eating breakfast on an empty stomach. Not doing as much belching since starting Protonix BID.   Wt 240 in Jan 2014. Now 207. Wife states he won't eat because of his stomach. Sometimes pain with eating. Bloating chronic. States he really liked the gallon jug of prep for the colonoscopy.   Poor historian.     Past Medical History    Diagnosis Date  . 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 Diabetes mellitus   . Macrocytosis     normal B12 & folate levels  . Ischemic cardiomyopathy     EF of 45-50% - posterior & inferior walls are severly hypokinetic  . Atrial fibrillation     not candidate for chronic anticoagulation with Coumadin given chrronic epistaxis as well as hemorrhagic conversion of his acute cva   . Hyperlipidemia   . Hypertension   . Complication of anesthesia   . PONV (postoperative nausea and vomiting)   . Acute renal insufficiency     resolved  . Myocardial infarct   . CHF (congestive heart failure)   . Shortness of breath   . Pleural effusion 10/05/2013    Past Surgical History  Procedure Laterality Date  . Coronary artery bypass graft    . Tonsillectomy    . Esophagogastroduodenoscopy (egd) with esophageal dilation N/A 10/27/2012    Dr. Jena Gauss: distal esophageal diverticulum, non-complete Schatzki's ring s/p dilation, distal esophageal nodule with benign path, negative Barrett's  . Cholecystectomy  2008  . Colonoscopy N/A 12/23/2012    Dr. Jena Gauss: multiple rectal and colonic polyps removed, tubular adenomas. needs surveillance April 2017.   . Cardiac surgery    . Video assisted thoracoscopy Right 10/31/2013    Procedure: VIDEO ASSISTED THORACOSCOPY;  Surgeon: Loreli Slot, MD;  Location: [facility];  Service: Thoracic;  Laterality: Right;  . Pleural effusion drainage Right 10/31/2013    Procedure: DRAINAGE OF PLEURAL EFFUSION;  Surgeon: Melrose Nakayama, MD;  Location: [place];  Service: Thoracic;  Laterality: Right;  . Chest tube insertion Right 10/31/2013    Procedure: INSERTION PLEURAL DRAINAGE CATHETER;  Surgeon: Melrose Nakayama, MD;  Location: [place];  Service: Thoracic;  Laterality: Right;    Current Outpatient Prescriptions  Medication Sig Dispense Refill  . aspirin EC 81 MG tablet Take 81 mg by mouth daily.       . Cholecalciferol (VITAMIN D-3) 5000 UNITS TABS Take 5,000 Units by mouth daily.       . furosemide (LASIX) 40 MG tablet Take 0.5 tablets (20 mg total) by mouth 2 (two) times daily.  30 tablet  1  . Insulin Detemir (LEVEMIR FLEXPEN) 100 UNIT/ML Pen Inject 20 Units into the skin daily.  2 pen  2  . LORazepam (ATIVAN) 0.5 MG tablet Take 0.5 mg by mouth 2 (two) times daily as needed for anxiety.      . lovastatin (MEVACOR) 40 MG tablet Take 40 mg by mouth at bedtime.        . Magnesium 250 MG TABS Take 250 mg by mouth daily. OTC      . metoprolol (LOPRESSOR) 50 MG tablet Take 50 mg by mouth 2 (two) times daily.      . pantoprazole (PROTONIX) 40 MG tablet Take 1 tablet (40 mg total) by mouth 2 (two) times daily before a meal.  60 tablet  3  . 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      . traMADol (ULTRAM) 50 MG tablet Take 50 mg by mouth every 12 (twelve) hours as needed for severe pain.      Marland Kitchen lubiprostone (AMITIZA) 8 MCG capsule Take 1 capsule (8 mcg total) by mouth 2 (two) times daily with a meal. TAKE WITH FOOD  60 capsule  5  . ondansetron (ZOFRAN) 4 MG tablet Take 1 tablet (4 mg total) by mouth every 8 (eight) hours as needed for nausea or vomiting.  30 tablet  1   No current facility-administered medications for this visit.    Allergies as of 12/01/2013 - Review Complete 12/01/2013  Allergen Reaction Noted  . Ciprofloxacin Other (See Comments) 10/07/2012  . Finasteride  08/03/2013  . Lisinopril Other (See Comments) 10/26/2013  . Metoclopramide  10/04/2013  . Xanax [alprazolam] Other (See Comments) 10/07/2012    Family History  Problem Relation Age of Onset  . Colon cancer Neg Hx   . Colon polyps Sister   . Colon polyps Sister     History   Social History  . Marital Status: Married    Spouse Name: N/A    Number of Children: N/A  . Years of Education: N/A    Social History Main Topics  . Smoking status: Never Smoker   . Smokeless tobacco: Never Used  . Alcohol Use: No  . Drug Use: No  . Sexual Activity: None   Other Topics Concern  . None   Social History Narrative  . None    Review of Systems: As mentioned in HPI.   Physical Exam: BP 120/55  Pulse 89  Temp(Src) 98.4 F (36.9 C) (Oral)  Wt 207 lb 9.6 oz (94.167 kg) General:   Alert and oriented. No distress noted. Pleasant and cooperative.  Head:  Normocephalic and atraumatic. Eyes:  Conjuctiva clear without scleral icterus. Mouth:  Oral mucosa pink and moist.  Heart:  S1, S2 present, irregular Lungs: CTAB but diminished in right posterior base Abdomen:  +BS, soft, non-tender and non-distended. No rebound or guarding. No HSM or masses noted. Extremities:  Without edema. Neurologic:  Alert and  oriented x4;  grossly normal neurologically. Skin:  Intact without significant lesions or rashes. Psych:  Alert and cooperative. Normal mood and affect.

## 2013-12-01 NOTE — Patient Instructions (Signed)
Stop Amitiza 24 mcg. Start taking Amitiza 8 mcg WITH FOOD twice a day (breakfast and dinner) every day. I want to get your constipation under better control.   Start taking a probiotic daily like Align, Restora, [employer] Colon Health, Digestive Advantage, or [employer] brand.   Take Protonix each morning 30 minutes before breakfast and 30 minutes before dinner.   I have sent a refill for Zofran for nausea.   I would like you to come back in about 2-4 weeks to see Dr. Gala Romney.

## 2013-12-01 NOTE — Assessment & Plan Note (Signed)
Amitiza 24 mcg may be too strong. Trial Amitiza 64mcg po BID WITH FOOD. Patient has not been taking correctly. Nausea symptoms may be secondary to this. Next colonoscopy due in April 2017.

## 2013-12-01 NOTE — Assessment & Plan Note (Signed)
73 year old male with chronic dyspepsia but thorough evaluation to include upper endoscopy, colonoscopy, CTA ruling out mesenteric ischemia. Historically he had done well with Aciphex and Carafate suspension; however, it appears that after non-GI hospitalizations, he had come off PPIs at some point. Poor historian. Now with recurrent symptoms since last summer of diffuse abdominal discomfort, bloating, and significant belching occurring mainly in the evening hours. He has JUST started taking Protonix BID and is nearing a week of this regularly. Although CT several months ago showed possible gastric wall thickening, this is likely due to gastritis. EGD on file from Feb 2014.   I feel his constellation of symptoms is multifactorial in the setting of chronic constipation, inadvertent non-compliance with routine PPI. He has lost a significant amount of weight since first consultation with [place] in Jan 2014, but he has also had significant non-GI health issues over the past few months documented requiring hospitalization.   I would like to have him continue Protonix BID; I feel we need to give this at least another week to take full effect.  Stop Amitiza 24 mcg, which he has only been using sparingly. Start REGULAR dosing of Amitiza 17mcg BID WITH FOOD, which he has not been taking correctly. Add probiotic daily Zofran prn nausea Return in 2-4 weeks to see Dr. Gala Romney only.  Doubt bacterial overgrowth; consider HBT or referral to tertiary center if persistent symptoms despite these efforts.

## 2013-12-13 ENCOUNTER — Ambulatory Visit
Admission: RE | Admit: 2013-12-13 | Discharge: 2013-12-13 | Disposition: A | Discharge status: Home / Self Care | Payer: Commercial Managed Care - HMO | Source: Ambulatory Visit | Attending: Thoracic Surgery (Cardiothoracic Vascular Surgery) | Admitting: Thoracic Surgery (Cardiothoracic Vascular Surgery)

## 2013-12-13 ENCOUNTER — Ambulatory Visit (INDEPENDENT_AMBULATORY_CARE_PROVIDER_SITE_OTHER): Payer: Commercial Managed Care - HMO | Admitting: Thoracic Surgery (Cardiothoracic Vascular Surgery)

## 2013-12-13 ENCOUNTER — Encounter: Payer: Self-pay | Admitting: Thoracic Surgery (Cardiothoracic Vascular Surgery)

## 2013-12-13 VITALS — BP 140/70 | HR 94 | Resp 20 | Ht 74.0 in | Wt 214.0 lb

## 2013-12-13 DIAGNOSIS — J90 Pleural effusion, not elsewhere classified: Secondary | ICD-10-CM

## 2013-12-13 NOTE — Progress Notes (Signed)
HPI:  Chris [name] returns for a scheduled followup visit.  He is a 73 year old gentleman who had a recurring right pleural effusion. I finally was able to talk him into doing a batch drainage and Pleurx catheter placement. That was done on February 9. We drained about 3 L of fluid at the time of surgery. Since his discharge from the hospital he's never drained more than 100 ML's from the Pleurx catheter and that was only the first couple of drainages. Since then he really was draining only a few drops of fluid when he returns for his last office visit 3 weeks ago.  Given the minimal drainage her recommended that he not drain the catheter for 3 weeks and return today with a PA and lateral chest x-ray to make sure that there was not an increase in the pleural fluid before removing the catheter.  He has not had any problems with his breathing over the past 3 weeks. His wife says that his belching and gastrointestinal discomfort has been much better since we drained him as well. He says his energy levels are much improved he mowed the lawn yesterday without any problems.  Past Medical History  Diagnosis Date  . 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 Diabetes mellitus   . Macrocytosis     normal B12 & folate levels  . Ischemic cardiomyopathy     EF of 45-50% - posterior & inferior walls are severly hypokinetic  . Atrial fibrillation     not candidate for chronic anticoagulation with Coumadin given chrronic epistaxis as well as hemorrhagic conversion of his acute cva   . Hyperlipidemia   . Hypertension   . Complication of anesthesia   . PONV (postoperative nausea and vomiting)   . Acute renal insufficiency     resolved  . Myocardial infarct   . CHF (congestive heart failure)   . Shortness of breath   . Pleural effusion 10/05/2013      Current Outpatient Prescriptions  Medication Sig Dispense Refill  . aspirin EC  81 MG tablet Take 81 mg by mouth daily.      . Cholecalciferol (VITAMIN D-3) 5000 UNITS TABS Take 5,000 Units by mouth daily.       . furosemide (LASIX) 40 MG tablet Take 0.5 tablets (20 mg total) by mouth 2 (two) times daily.  30 tablet  1  . Insulin Detemir (LEVEMIR FLEXPEN) 100 UNIT/ML Pen Inject 20 Units into the skin daily.  2 pen  2  . lactobacillus acidophilus (BACID) TABS tablet Take 1 tablet by mouth daily.      Marland Kitchen LORazepam (ATIVAN) 0.5 MG tablet Take 0.5 mg by mouth 2 (two) times daily as needed for anxiety.      . lovastatin (MEVACOR) 40 MG tablet Take 40 mg by mouth at bedtime.        Marland Kitchen lubiprostone (AMITIZA) 8 MCG capsule Take 24 mcg by mouth every other day. TAKE WITH FOOD      . Magnesium 250 MG TABS Take 250 mg by mouth daily. OTC      . metoprolol (LOPRESSOR) 50 MG tablet Take 50 mg by mouth 2 (two) times daily.      . ondansetron (ZOFRAN) 4 MG tablet Take 1 tablet (4 mg total) by mouth every 8 (eight) hours as needed for nausea or vomiting.  30 tablet  1  . pantoprazole (PROTONIX) 40 MG tablet Take 1 tablet (40 mg  total) by mouth 2 (two) times daily before a meal.  60 tablet  3  . 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      . traMADol (ULTRAM) 50 MG tablet Take 50 mg by mouth every 12 (twelve) hours as needed for severe pain.       No current facility-administered medications for this visit.    Physical Exam BP 140/70  Pulse 94  Resp 20  Ht 6\' 2"  (1.88 m)  Wt 214 lb (97.07 kg)  BMI 27.46 kg/m2  SpO49 46% 73 year old male in no acute distress Breath sounds essentially equal bilaterally Pleurx catheter in place, no sign of infection  Diagnostic Tests: CHEST 2 VIEW  COMPARISON: Prior radiograph from 11/22/2013  FINDINGS:  Median sternotomy wires of underlying CABG markers again noted.  Cardiomegaly is stable.  Lungs are normally inflated. Moderate right  pleural effusion again  seen, unchanged from prior. There is associated right basilar  atelectasis. Minimal blunting of the left costophrenic angle may  reflect a small left pleural effusion versus chronic pleural  reaction/ scarring. No definite focal infiltrate. No pneumothorax.  No acute osseus abnormality.  IMPRESSION:  1. Moderate right pleural effusion with associated right basilar  atelectasis, not significantly changed relative to prior radiograph  from 11/22/2013.  2. Minimal blunting of the left costophrenic angle, which may  reflect chronic pleural reaction/scarring versus small pleural  effusion.  Electronically Signed  By: Jeannine Boga M.D.  On: 12/13/2013 13:12    Impression: 73 year old gentleman with a recurring right pleural effusion who is status post thoracoscopic drainage and Pleurx catheter placement on February 9. He is now about 6 weeks postop and has not had any ongoing drainage from the pleural catheter. He is not drained in 3 weeks. We hooked it up in the office in only a few drops of fluid were expressed. His chest x-ray has remained stable. There is some scarring and possibly a small residual effusion on chest x-ray but nothing concerning. We will go ahead and remove his Pleurx catheter today.  The pathologist did raise the possibility that he could have lymphoma on his pleural biopsies. They saw with inflammation but some cells that were suspicious but we did not ever get enough fluid to do flow cytometry to try to confirm that. Therefore do think it is important that we follow him up with a repeat CT scan in about 3 months to make sure that there is not anything suspicious.  Procedure:  After sterilizing the Pleurx catheter exit site, 1% lidocaine (2 ml) was injected around the catheter and cuff. A hemostat was used to gently separate the cuff from the surrounding subcutaneous tissue. The catheter was removed intact without difficulty. The patient  tolerated the procedure well.   Plan: Return in 3 months with CT of chest

## 2013-12-14 LAB — AFB CULTURE WITH SMEAR (NOT AT ARMC): Acid Fast Smear: NONE SEEN

## 2013-12-21 ENCOUNTER — Ambulatory Visit: Payer: Medicare HMO | Admitting: Gastroenterology

## 2014-01-10 ENCOUNTER — Encounter: Payer: Self-pay | Admitting: Internal Medicine

## 2014-01-10 ENCOUNTER — Ambulatory Visit (INDEPENDENT_AMBULATORY_CARE_PROVIDER_SITE_OTHER): Payer: Commercial Managed Care - HMO | Admitting: Internal Medicine

## 2014-01-10 ENCOUNTER — Other Ambulatory Visit: Payer: Self-pay | Admitting: Internal Medicine

## 2014-01-10 VITALS — BP 123/60 | HR 72 | Temp 97.8°F | Ht 74.0 in | Wt 212.0 lb

## 2014-01-10 DIAGNOSIS — K219 Gastro-esophageal reflux disease without esophagitis: Secondary | ICD-10-CM

## 2014-01-10 DIAGNOSIS — R142 Eructation: Secondary | ICD-10-CM

## 2014-01-10 DIAGNOSIS — R141 Gas pain: Secondary | ICD-10-CM

## 2014-01-10 DIAGNOSIS — R143 Flatulence: Secondary | ICD-10-CM

## 2014-01-10 DIAGNOSIS — K5900 Constipation, unspecified: Secondary | ICD-10-CM

## 2014-01-10 DIAGNOSIS — R1319 Other dysphagia: Secondary | ICD-10-CM

## 2014-01-10 DIAGNOSIS — R140 Abdominal distension (gaseous): Secondary | ICD-10-CM

## 2014-01-10 NOTE — Patient Instructions (Addendum)
Continue Amitiza 24 microgram tablet every other day as needed for bowel function-  Taken during a meal  Continue to use Gas-x as neede for bloating  Barium pill esophogram to evaluate difficulty swallowing  Further recommendations to follow

## 2014-01-10 NOTE — Progress Notes (Signed)
Primary Care Physician:  Gar Ponto, MD Primary Gastroenterologist:  Dr. Gala Romney  Pre-Procedure History & Physical: HPI:  Chris [name] is a 73 y.o. male here for abdominal bloating/discomfort. Takes Pepcid complete ST for typical reflux symptoms. Takes Gas-X sometimes couple times daily with improvement in symptoms. Unsure whether or not he is taking a probiotic. Has some dysphagia. Like it was before dilation". Help her tonics made him sick. Extremities and 24 mcg every other day takes one or before eating. She's gained 5 pounds since his last visit. Due for surveillance colonoscopy 2017. No nausea lately. No anticoagulation because of epistaxis.   Wife reports she's having difficulty with sleeping. Goes to bed around 5:30 the afternoon wakes up at 3 AM.. She feels this behavior is making him feel badly during the day.  Recently had pleural effusion drained.  Etiology apparently not known.  Past Medical History  Diagnosis Date  . 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 Diabetes mellitus   . Macrocytosis     normal B12 & folate levels  . Ischemic cardiomyopathy     EF of 45-50% - posterior & inferior walls are severly hypokinetic  . Atrial fibrillation     not candidate for chronic anticoagulation with Coumadin given chrronic epistaxis as well as hemorrhagic conversion of his acute cva   . Hyperlipidemia   . Hypertension   . Complication of anesthesia   . PONV (postoperative nausea and vomiting)   . Acute renal insufficiency     resolved  . Myocardial infarct   . CHF (congestive heart failure)   . Shortness of breath   . Pleural effusion 10/05/2013  . Tubular adenoma     Past Surgical History  Procedure Laterality Date  . Coronary artery bypass graft    . Tonsillectomy    . Esophagogastroduodenoscopy (egd) with esophageal dilation N/A 10/27/2012    Dr. Gala Romney: distal esophageal diverticulum, non-complete  Schatzki's ring s/p dilation, distal esophageal nodule with benign path, negative Barrett's  . Cholecystectomy  2008  . Colonoscopy N/A 12/23/2012    Dr. Gala Romney: multiple rectal and colonic polyps removed, tubular adenomas. needs surveillance April 2017.   . Cardiac surgery    . Video assisted thoracoscopy Right 10/31/2013    Procedure: VIDEO ASSISTED THORACOSCOPY;  Surgeon: Melrose Nakayama, MD;  Location: [place];  Service: Thoracic;  Laterality: Right;  . Pleural effusion drainage Right 10/31/2013    Procedure: DRAINAGE OF PLEURAL EFFUSION;  Surgeon: Melrose Nakayama, MD;  Location: [place];  Service: Thoracic;  Laterality: Right;  . Chest tube insertion Right 10/31/2013    Procedure: INSERTION PLEURAL DRAINAGE CATHETER;  Surgeon: Melrose Nakayama, MD;  Location: [place];  Service: Thoracic;  Laterality: Right;    Prior to Admission medications   Medication Sig Start Date End Date Taking? Authorizing Provider  AMITIZA 24 MCG capsule Take 24 mcg by mouth daily with breakfast.  12/13/13  Yes Historical Provider, MD  aspirin EC 81 MG tablet Take 81 mg by mouth daily.   Yes Historical Provider, MD  atorvastatin (LIPITOR) 40 MG tablet Take 40 mg by mouth daily at 6 PM.  01/07/14  Yes Historical Provider, MD  Cholecalciferol (VITAMIN D-3) 5000 UNITS TABS Take 5,000 Units by mouth daily.    Yes Historical Provider, MD  furosemide (LASIX) 40 MG tablet Take 0.5 tablets (20 mg total) by mouth 2 (two) times daily. 10/13/13  Yes Kinnie Feil,  MD  Insulin Detemir (LEVEMIR FLEXPEN) 100 UNIT/ML Pen Inject 20 Units into the skin daily. 11/04/13  Yes Coolidge Breeze, PA-C  lactobacillus acidophilus (BACID) TABS tablet Take 1 tablet by mouth daily.   Yes Historical Provider, MD  LORazepam (ATIVAN) 0.5 MG tablet Take 0.5 mg by mouth 2 (two) times daily as needed for anxiety.   Yes Historical Provider, MD  lovastatin (MEVACOR) 40 MG tablet Take 40 mg by mouth at bedtime.     Yes Historical Provider, MD  Magnesium  250 MG TABS Take 250 mg by mouth daily. OTC   Yes Historical Provider, MD  metoprolol (LOPRESSOR) 50 MG tablet Take 50 mg by mouth 2 (two) times daily.   Yes Historical Provider, MD  ondansetron (ZOFRAN) 4 MG tablet Take 1 tablet (4 mg total) by mouth every 8 (eight) hours as needed for nausea or vomiting. 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   Yes Historical Provider, MD  traMADol (ULTRAM) 50 MG tablet Take 50 mg by mouth every 12 (twelve) hours as needed for severe pain.   Yes Historical Provider, MD  pantoprazole (PROTONIX) 40 MG tablet Take 1 tablet (40 mg total) by mouth 2 (two) times daily before a meal. 12/01/13   Orvil Feil, NP    Allergies as of 01/10/2014 - Review Complete 01/10/2014  Allergen Reaction Noted  . Ciprofloxacin Other (See Comments) 10/07/2012  . Finasteride  08/03/2013  . Lisinopril Other (See Comments) 10/26/2013  . Metoclopramide  10/04/2013  . Protonix [pantoprazole sodium]  01/06/2014  . Xanax [alprazolam] Other (See Comments) 10/07/2012    Family History  Problem Relation Age of Onset  . Colon cancer Neg Hx   . Colon polyps Sister   . Colon polyps Sister     History   Social History  . Marital Status: Married    Spouse Name: N/A    Number of Children: N/A  . Years of Education: N/A   Occupational History  . Not on file.   Social History Main Topics  . Smoking status: Never Smoker   . Smokeless tobacco: Never Used  . Alcohol Use: No  . Drug Use: No  . Sexual Activity: Not on file   Other Topics Concern  . Not on file   Social History Narrative  . No narrative on file    Review of Systems: See HPI, otherwise negative ROS  Physical Exam: BP 123/60  Pulse 72  Temp(Src) 97.8 F (36.6 C) (Oral)  Ht 6\' 2"  (1.88 m)  Wt 212 lb (96.163 kg)  BMI 27.21 kg/m2 General:   Somewhat disheveled,  pleasant and cooperative in NAD. Ambulates with a cane. Accompanied by spouse. Skin:  Intact without significant lesions or rashes. Eyes:  Sclera clear, no icterus.   Conjunctiva pink. Ears:  Normal auditory acuity. Nose:  No deformity, discharge,  or lesions. Mouth:  No deformity or lesions. Neck:  Supple; no masses or thyromegaly. No significant cervical adenopathy. Lungs:  Clear throughout to auscultation.   No wheezes, crackles, or rhonchi. No acute distress. Heart:  Regular rate and rhythm; no murmurs, clicks, rubs,  or gallops. Abdomen: Nondistended. Positive bowel sounds. No bruits. Soft and nontender without appreciable mass or organomegaly. Pulses:  Normal pulses noted. Extremities:  Without clubbing or edema.  Impression:   73 year old gentleman with fairly nonspecific GI complaints  of intermittent gas bloat and constipation. It's difficult to extract an accurate history in this gentleman. He seems to be doing better. 5 pound weight gain somewhat reassuring. He is vague recurrent esophageal dysphagia in the setting of known esophageal ring. He needs to take Amitiza during a meal and not an hour before consuming a meal. History of rather unusual sleeping habits which may or may not be contributing to some of his GI symptoms. He has been fairly extensively evaluated previously. He has an unusual intolerance to Protonix.  Recommendations:    Continue Amitiza 24 microgram tablet every other day as needed for bowel function-  Taken during a meal  Continue to use Gas-x as neede for bloating  Barium pill esophogram to evaluate difficulty swallowing  Discuss sleeping habits with Dr. Quillian Quince  Further recommendations to follow

## 2014-01-12 ENCOUNTER — Ambulatory Visit (HOSPITAL_COMMUNITY)
Admission: RE | Admit: 2014-01-12 | Discharge: 2014-01-12 | Disposition: A | Discharge status: Home / Self Care | Payer: Medicare HMO | Source: Ambulatory Visit | Attending: Internal Medicine | Admitting: Internal Medicine

## 2014-01-12 DIAGNOSIS — R1310 Dysphagia, unspecified: Secondary | ICD-10-CM | POA: Insufficient documentation

## 2014-01-12 DIAGNOSIS — Z951 Presence of aortocoronary bypass graft: Secondary | ICD-10-CM | POA: Insufficient documentation

## 2014-01-12 DIAGNOSIS — R1319 Other dysphagia: Secondary | ICD-10-CM

## 2014-01-12 DIAGNOSIS — K225 Diverticulum of esophagus, acquired: Secondary | ICD-10-CM | POA: Insufficient documentation

## 2014-01-18 ENCOUNTER — Other Ambulatory Visit: Payer: Self-pay | Admitting: *Deleted

## 2014-01-18 DIAGNOSIS — J90 Pleural effusion, not elsewhere classified: Secondary | ICD-10-CM

## 2014-01-19 ENCOUNTER — Other Ambulatory Visit: Payer: Self-pay | Admitting: *Deleted

## 2014-01-19 DIAGNOSIS — J90 Pleural effusion, not elsewhere classified: Secondary | ICD-10-CM

## 2014-02-20 ENCOUNTER — Ambulatory Visit: Payer: Medicare HMO | Admitting: Gastroenterology

## 2014-03-21 ENCOUNTER — Ambulatory Visit: Payer: Medicare HMO | Admitting: Thoracic Surgery (Cardiothoracic Vascular Surgery)

## 2014-03-21 ENCOUNTER — Other Ambulatory Visit: Payer: Medicare HMO

## 2014-05-24 ENCOUNTER — Encounter: Payer: Self-pay | Admitting: Gastroenterology

## 2014-05-24 ENCOUNTER — Ambulatory Visit: Payer: Medicare HMO | Admitting: Gastroenterology

## 2014-05-24 ENCOUNTER — Encounter: Payer: Self-pay | Admitting: Internal Medicine

## 2014-05-31 NOTE — Progress Notes (Signed)
Appointment cancelled. Encounter opened in error by front office staff.

## 2015-01-09 ENCOUNTER — Inpatient Hospital Stay (HOSPITAL_COMMUNITY)
Admission: EM | Admit: 2015-01-09 | Discharge: 2015-01-11 | DRG: 293 | Disposition: A | Discharge status: Home Health | Payer: Commercial Managed Care - HMO | Attending: Internal Medicine | Admitting: Internal Medicine

## 2015-01-09 ENCOUNTER — Encounter (HOSPITAL_COMMUNITY): Payer: Self-pay | Admitting: Emergency Medicine

## 2015-01-09 ENCOUNTER — Emergency Department (HOSPITAL_COMMUNITY): Payer: Commercial Managed Care - HMO

## 2015-01-09 DIAGNOSIS — Z951 Presence of aortocoronary bypass graft: Secondary | ICD-10-CM

## 2015-01-09 DIAGNOSIS — I255 Ischemic cardiomyopathy: Secondary | ICD-10-CM | POA: Diagnosis present

## 2015-01-09 DIAGNOSIS — I10 Essential (primary) hypertension: Secondary | ICD-10-CM | POA: Diagnosis present

## 2015-01-09 DIAGNOSIS — I129 Hypertensive chronic kidney disease with stage 1 through stage 4 chronic kidney disease, or unspecified chronic kidney disease: Secondary | ICD-10-CM | POA: Diagnosis present

## 2015-01-09 DIAGNOSIS — I5023 Acute on chronic systolic (congestive) heart failure: Secondary | ICD-10-CM | POA: Diagnosis present

## 2015-01-09 DIAGNOSIS — I482 Chronic atrial fibrillation, unspecified: Secondary | ICD-10-CM | POA: Diagnosis present

## 2015-01-09 DIAGNOSIS — I252 Old myocardial infarction: Secondary | ICD-10-CM | POA: Diagnosis not present

## 2015-01-09 DIAGNOSIS — E559 Vitamin D deficiency, unspecified: Secondary | ICD-10-CM | POA: Diagnosis present

## 2015-01-09 DIAGNOSIS — I5021 Acute systolic (congestive) heart failure: Secondary | ICD-10-CM

## 2015-01-09 DIAGNOSIS — N184 Chronic kidney disease, stage 4 (severe): Secondary | ICD-10-CM | POA: Diagnosis not present

## 2015-01-09 DIAGNOSIS — I2510 Atherosclerotic heart disease of native coronary artery without angina pectoris: Secondary | ICD-10-CM | POA: Diagnosis present

## 2015-01-09 DIAGNOSIS — I5043 Acute on chronic combined systolic (congestive) and diastolic (congestive) heart failure: Secondary | ICD-10-CM | POA: Diagnosis not present

## 2015-01-09 DIAGNOSIS — R0602 Shortness of breath: Secondary | ICD-10-CM

## 2015-01-09 DIAGNOSIS — Z8673 Personal history of transient ischemic attack (TIA), and cerebral infarction without residual deficits: Secondary | ICD-10-CM

## 2015-01-09 DIAGNOSIS — D649 Anemia, unspecified: Secondary | ICD-10-CM | POA: Diagnosis present

## 2015-01-09 DIAGNOSIS — K5900 Constipation, unspecified: Secondary | ICD-10-CM | POA: Diagnosis present

## 2015-01-09 DIAGNOSIS — Z9114 Patient's other noncompliance with medication regimen: Secondary | ICD-10-CM | POA: Diagnosis present

## 2015-01-09 DIAGNOSIS — J90 Pleural effusion, not elsewhere classified: Secondary | ICD-10-CM | POA: Diagnosis present

## 2015-01-09 DIAGNOSIS — Z794 Long term (current) use of insulin: Secondary | ICD-10-CM | POA: Diagnosis not present

## 2015-01-09 DIAGNOSIS — Z7982 Long term (current) use of aspirin: Secondary | ICD-10-CM

## 2015-01-09 DIAGNOSIS — N183 Chronic kidney disease, stage 3 (moderate): Secondary | ICD-10-CM | POA: Diagnosis present

## 2015-01-09 DIAGNOSIS — E1121 Type 2 diabetes mellitus with diabetic nephropathy: Secondary | ICD-10-CM | POA: Diagnosis present

## 2015-01-09 DIAGNOSIS — E109 Type 1 diabetes mellitus without complications: Secondary | ICD-10-CM | POA: Diagnosis present

## 2015-01-09 DIAGNOSIS — E785 Hyperlipidemia, unspecified: Secondary | ICD-10-CM | POA: Diagnosis present

## 2015-01-09 DIAGNOSIS — I509 Heart failure, unspecified: Secondary | ICD-10-CM

## 2015-01-09 LAB — BASIC METABOLIC PANEL
Anion gap: 9 (ref 5–15)
BUN: 48 mg/dL — AB (ref 6–23)
CHLORIDE: 101 mmol/L (ref 96–112)
CO2: 28 mmol/L (ref 19–32)
Calcium: 8.5 mg/dL (ref 8.4–10.5)
Creatinine, Ser: 2.49 mg/dL — ABNORMAL HIGH (ref 0.50–1.35)
GFR calc non Af Amer: 24 mL/min — ABNORMAL LOW (ref >90)
GFR, EST AFRICAN AMERICAN: 28 mL/min — AB (ref >90)
GLUCOSE: 231 mg/dL — AB (ref 70–99)
Potassium: 4.4 mmol/L (ref 3.5–5.1)
Sodium: 138 mmol/L (ref 135–145)

## 2015-01-09 LAB — TSH: TSH: 1.555 u[IU]/mL (ref 0.350–4.500)

## 2015-01-09 LAB — BRAIN NATRIURETIC PEPTIDE: B NATRIURETIC PEPTIDE 5: 371 pg/mL — AB (ref 0.0–100.0)

## 2015-01-09 LAB — HEPATIC FUNCTION PANEL
ALT: 20 U/L (ref 0–53)
AST: 28 U/L (ref 0–37)
Albumin: 3.4 g/dL — ABNORMAL LOW (ref 3.5–5.2)
Alkaline Phosphatase: 141 U/L — ABNORMAL HIGH (ref 39–117)
BILIRUBIN INDIRECT: 0.9 mg/dL (ref 0.3–0.9)
Bilirubin, Direct: 0.3 mg/dL (ref 0.0–0.5)
TOTAL PROTEIN: 7.6 g/dL (ref 6.0–8.3)
Total Bilirubin: 1.2 mg/dL (ref 0.3–1.2)

## 2015-01-09 LAB — CBC
HEMATOCRIT: 33.5 % — AB (ref 39.0–52.0)
Hemoglobin: 10.9 g/dL — ABNORMAL LOW (ref 13.0–17.0)
MCH: 34.4 pg — AB (ref 26.0–34.0)
MCHC: 32.5 g/dL (ref 30.0–36.0)
MCV: 105.7 fL — AB (ref 78.0–100.0)
Platelets: 148 10*3/uL — ABNORMAL LOW (ref 150–400)
RBC: 3.17 MIL/uL — ABNORMAL LOW (ref 4.22–5.81)
RDW: 13.6 % (ref 11.5–15.5)
WBC: 6 10*3/uL (ref 4.0–10.5)

## 2015-01-09 LAB — TROPONIN I
TROPONIN I: 0.06 ng/mL — AB (ref <0.031)
Troponin I: 0.06 ng/mL — ABNORMAL HIGH (ref <0.031)

## 2015-01-09 MED ORDER — VITAMIN D 1000 UNITS PO TABS
5000.0000 [IU] | ORAL_TABLET | Freq: Every day | ORAL | Status: DC
  Administered 2015-01-10 – 2015-01-11 (×2): 5000 [IU] via ORAL
  Filled 2015-01-09 (×2): qty 5

## 2015-01-09 MED ORDER — ENOXAPARIN SODIUM 30 MG/0.3ML ~~LOC~~ SOLN: 30.0000 mg | SUBCUTANEOUS | Status: DC

## 2015-01-09 MED ORDER — NITROGLYCERIN 2 % TD OINT
1.0000 [in_us] | TOPICAL_OINTMENT | Freq: Once | TRANSDERMAL | Status: AC
  Administered 2015-01-09: 1 [in_us] via TOPICAL
  Filled 2015-01-09: qty 1

## 2015-01-09 MED ORDER — ONDANSETRON HCL 4 MG PO TABS
4.0000 mg | ORAL_TABLET | Freq: Four times a day (QID) | ORAL | Status: DC | PRN
  Administered 2015-01-11: 4 mg via ORAL
  Filled 2015-01-09: qty 1

## 2015-01-09 MED ORDER — SODIUM CHLORIDE 0.9 % IJ SOLN
3.0000 mL | Freq: Two times a day (BID) | INTRAMUSCULAR | Status: DC
  Administered 2015-01-10 (×3): 3 mL via INTRAVENOUS

## 2015-01-09 MED ORDER — FUROSEMIDE 10 MG/ML IJ SOLN: 40.0000 mg | Freq: Two times a day (BID) | INTRAMUSCULAR | Status: DC

## 2015-01-09 MED ORDER — INSULIN ASPART 100 UNIT/ML ~~LOC~~ SOLN: 0.0000 [IU] | Freq: Every day | SUBCUTANEOUS | Status: DC

## 2015-01-09 MED ORDER — METOPROLOL TARTRATE 50 MG PO TABS
50.0000 mg | ORAL_TABLET | Freq: Two times a day (BID) | ORAL | Status: DC
  Administered 2015-01-10 – 2015-01-11 (×3): 50 mg via ORAL
  Filled 2015-01-09 (×4): qty 1

## 2015-01-09 MED ORDER — ONDANSETRON HCL 4 MG/2ML IJ SOLN: 4.0000 mg | Freq: Four times a day (QID) | INTRAMUSCULAR | Status: DC | PRN

## 2015-01-09 MED ORDER — ASPIRIN EC 81 MG PO TBEC: 81.0000 mg | DELAYED_RELEASE_TABLET | Freq: Every day | ORAL | Status: DC

## 2015-01-09 MED ORDER — BACID PO TABS: 1.0000 | ORAL_TABLET | Freq: Every day | ORAL | Status: DC

## 2015-01-09 MED ORDER — RISAQUAD PO CAPS
1.0000 | ORAL_CAPSULE | Freq: Every day | ORAL | Status: DC
  Administered 2015-01-10 – 2015-01-11 (×2): 1 via ORAL
  Filled 2015-01-09 (×2): qty 1

## 2015-01-09 MED ORDER — ENOXAPARIN SODIUM 30 MG/0.3ML ~~LOC~~ SOLN
30.0000 mg | SUBCUTANEOUS | Status: DC
  Filled 2015-01-09: qty 0.3

## 2015-01-09 MED ORDER — PANTOPRAZOLE SODIUM 40 MG PO TBEC
40.0000 mg | DELAYED_RELEASE_TABLET | Freq: Two times a day (BID) | ORAL | Status: DC
  Administered 2015-01-11: 40 mg via ORAL
  Filled 2015-01-09: qty 1

## 2015-01-09 MED ORDER — PREDNISOLONE ACETATE 1 % OP SUSP
1.0000 [drp] | Freq: Four times a day (QID) | OPHTHALMIC | Status: DC | PRN
  Filled 2015-01-09: qty 1

## 2015-01-09 MED ORDER — LORAZEPAM 0.5 MG PO TABS
0.5000 mg | ORAL_TABLET | Freq: Two times a day (BID) | ORAL | Status: DC | PRN
  Administered 2015-01-11: 0.5 mg via ORAL
  Filled 2015-01-09: qty 1

## 2015-01-09 MED ORDER — INSULIN ASPART 100 UNIT/ML ~~LOC~~ SOLN
0.0000 [IU] | Freq: Three times a day (TID) | SUBCUTANEOUS | Status: DC
  Administered 2015-01-10: 2 [IU] via SUBCUTANEOUS
  Administered 2015-01-10: 3 [IU] via SUBCUTANEOUS
  Administered 2015-01-11: 2 [IU] via SUBCUTANEOUS

## 2015-01-09 MED ORDER — FUROSEMIDE 10 MG/ML IJ SOLN
40.0000 mg | Freq: Once | INTRAMUSCULAR | Status: AC
  Administered 2015-01-09: 40 mg via INTRAVENOUS
  Filled 2015-01-09: qty 4

## 2015-01-09 MED ORDER — ASPIRIN EC 81 MG PO TBEC
81.0000 mg | DELAYED_RELEASE_TABLET | Freq: Every day | ORAL | Status: DC
  Administered 2015-01-10 – 2015-01-11 (×2): 81 mg via ORAL
  Filled 2015-01-09 (×2): qty 1

## 2015-01-09 MED ORDER — INSULIN DETEMIR 100 UNIT/ML ~~LOC~~ SOLN
20.0000 [IU] | Freq: Every day | SUBCUTANEOUS | Status: DC
  Administered 2015-01-10 – 2015-01-11 (×2): 20 [IU] via SUBCUTANEOUS
  Filled 2015-01-09 (×3): qty 0.2

## 2015-01-09 MED ORDER — LUBIPROSTONE 24 MCG PO CAPS
24.0000 ug | ORAL_CAPSULE | Freq: Every day | ORAL | Status: DC
  Administered 2015-01-11: 24 ug via ORAL
  Filled 2015-01-09 (×3): qty 1

## 2015-01-09 MED ORDER — ONDANSETRON HCL 4 MG PO TABS: 4.0000 mg | ORAL_TABLET | Freq: Three times a day (TID) | ORAL | Status: DC | PRN

## 2015-01-09 MED ORDER — ATORVASTATIN CALCIUM 40 MG PO TABS
40.0000 mg | ORAL_TABLET | Freq: Every day | ORAL | Status: DC
  Administered 2015-01-10: 40 mg via ORAL
  Filled 2015-01-09 (×2): qty 1

## 2015-01-09 MED ORDER — FUROSEMIDE 10 MG/ML IJ SOLN
40.0000 mg | Freq: Two times a day (BID) | INTRAMUSCULAR | Status: DC
  Administered 2015-01-10 (×2): 40 mg via INTRAVENOUS
  Filled 2015-01-09 (×2): qty 4

## 2015-01-09 MED ORDER — TRAMADOL HCL 50 MG PO TABS: 50.0000 mg | ORAL_TABLET | Freq: Two times a day (BID) | ORAL | Status: DC | PRN

## 2015-01-09 NOTE — H&P (Signed)
[facility] History and Physical  Chris [name] CHE:[id number] DOB: [birth year] DOA: 01/09/2015  Referring physician: ER PCP: Gar Ponto, MD   Chief Complaint: Leg swelling, dyspnea.  HPI: Chris [name] is a 74 y.o. male  This is a 74 year old man who has a history of recurrent pleural effusions and has had thoracentesis several times, now presents with palpitations which are irregular along with leg swelling and degree of dyspnea. He denies any chest pain. He does describe some abdominal swelling. He has discontinued taking Lasix apparently a few months ago. He has a history of atrial fibrillation but has not been considered a candidate for chronic anticoagulations because of history of hemorrhagic conversion of an acute CVA along with history of chronic epistaxis. He is now being admitted for further management of his symptoms. Primarily it is felt that he has congestive heart failure.   Review of Systems:  Apart from symptoms above, all systems negative.  Past Medical History  Diagnosis Date  . 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 Diabetes mellitus   . Macrocytosis     normal B12 & folate levels  . Ischemic cardiomyopathy     EF of 45-50% - posterior & inferior walls are severly hypokinetic  . Atrial fibrillation     not candidate for chronic anticoagulation with Coumadin given chrronic epistaxis as well as hemorrhagic conversion of his acute cva   . Hyperlipidemia   . Hypertension   . Complication of anesthesia   . PONV (postoperative nausea and vomiting)   . Acute renal insufficiency     resolved  . Myocardial infarct   . CHF (congestive heart failure)   . Shortness of breath   . Pleural effusion 10/05/2013  . Tubular adenoma    Past Surgical History  Procedure Laterality Date  . Coronary artery bypass graft    . Tonsillectomy    . Esophagogastroduodenoscopy (egd) with esophageal  dilation N/A 10/27/2012    Dr. Gala Romney: distal esophageal diverticulum, non-complete Schatzki's ring s/p dilation, distal esophageal nodule with benign path, negative Barrett's  . Cholecystectomy  2008  . Colonoscopy N/A 12/23/2012    Dr. Gala Romney: multiple rectal and colonic polyps removed, tubular adenomas. needs surveillance April 2017.   . Cardiac surgery    . Video assisted thoracoscopy Right 10/31/2013    Procedure: VIDEO ASSISTED THORACOSCOPY;  Surgeon: Melrose Nakayama, MD;  Location: [place];  Service: Thoracic;  Laterality: Right;  . Pleural effusion drainage Right 10/31/2013    Procedure: DRAINAGE OF PLEURAL EFFUSION;  Surgeon: Melrose Nakayama, MD;  Location: [place];  Service: Thoracic;  Laterality: Right;  . Chest tube insertion Right 10/31/2013    Procedure: INSERTION PLEURAL DRAINAGE CATHETER;  Surgeon: Melrose Nakayama, MD;  Location: [place];  Service: Thoracic;  Laterality: Right;   Social History:  reports that he has never smoked. He has never used smokeless tobacco. He reports that he does not drink alcohol or use illicit drugs.  Allergies  Allergen Reactions  . Ciprofloxacin Other (See Comments)    Sores all on legs.   . Finasteride     Affected  Patients testicles  . Lisinopril Other (See Comments)    Kidney damage    . Metoclopramide     Jerking all over  . Protonix [Pantoprazole Sodium]     abd pain  . Xanax [Alprazolam] Other (See Comments)    Not in right state of mind.  Family History  Problem Relation Age of Onset  . Colon cancer Neg Hx   . Colon polyps Sister   . Colon polyps Sister     Prior to Admission medications   Medication Sig Start Date End Date Taking? Authorizing Provider  aspirin EC 81 MG tablet Take 81 mg by mouth daily.   Yes Historical Provider, MD  Cholecalciferol (VITAMIN D-3) 5000 UNITS TABS Take 5,000 Units by mouth daily.    Yes Historical Provider, MD  furosemide (LASIX) 40 MG tablet Take 0.5 tablets (20 mg total) by mouth 2  (two) times daily. Patient taking differently: Take 40 mg by mouth daily as needed for fluid.  10/13/13  Yes Kinnie Feil, MD  Insulin Detemir (LEVEMIR FLEXPEN) 100 UNIT/ML Pen Inject 20 Units into the skin daily. Patient taking differently: Inject 50 Units into the skin daily.  11/04/13  Yes Gina L Collins, PA-C  LORazepam (ATIVAN) 0.5 MG tablet Take 0.5 mg by mouth 2 (two) times daily as needed for anxiety.   Yes Historical Provider, MD  AMITIZA 24 MCG capsule Take 24 mcg by mouth daily with breakfast.  12/13/13   Historical Provider, MD  atorvastatin (LIPITOR) 40 MG tablet Take 40 mg by mouth daily at 6 PM.  01/07/14   Historical Provider, MD  lactobacillus acidophilus (BACID) TABS tablet Take 1 tablet by mouth daily.    Historical Provider, MD  metoprolol (LOPRESSOR) 50 MG tablet Take 50 mg by mouth 2 (two) times daily.    Historical Provider, MD  ondansetron (ZOFRAN) 4 MG tablet Take 1 tablet (4 mg total) by mouth every 8 (eight) hours as needed for nausea or vomiting. 12/01/13   Orvil Feil, NP  pantoprazole (PROTONIX) 40 MG tablet Take 1 tablet (40 mg total) by mouth 2 (two) times daily before a meal. 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    Historical Provider, MD  traMADol (ULTRAM) 50 MG tablet Take 50 mg by mouth every 12 (twelve) hours as needed for severe pain.    Historical Provider, MD   Physical Exam: Filed Vitals:   01/09/15 1828 01/09/15 1943 01/09/15 2000  BP: 158/84  151/86  Pulse: 87 65 72  Temp: 97.9 F (36.6 C)    TempSrc: Oral    Resp: 16 20 18   Height: 6\' 1"  (1.854 m)    Weight: 96.616 kg (213 lb)    SpO2: 97%  94%    Wt Readings from Last 3 Encounters:  01/09/15 96.616 kg (213 lb)  01/10/14 96.163 kg (212 lb)  12/13/13 97.07 kg (214 lb)    General:  Appears calm and comfortable. He does not appear to be in respiratory  distress. Eyes: PERRL, normal lids, irises & conjunctiva ENT: grossly normal hearing, lips & tongue Neck: no LAD, masses or thyromegaly Cardiovascular: Irregularly irregular, consistent with atrial fibrillation. He does have peripheral pitting leg edema. Telemetry: Atrial fibrillation. Ventricular rate is controlled.  Respiratory: Reduced air entry bilaterally more on the right than left. Dullness to percussion, more on the right than the left. Abdomen: soft, ntnd Skin: no rash or induration seen on limited exam Musculoskeletal: grossly normal tone BUE/BLE Psychiatric: grossly normal mood and affect, speech fluent and appropriate Neurologic: grossly non-focal.          Labs on Admission:  Basic Metabolic Panel:  Recent Labs  Lab 01/09/15 1850  NA 138  K 4.4  CL 101  CO2 28  GLUCOSE 231*  BUN 48*  CREATININE 2.49*  CALCIUM 8.5   Liver Function Tests:  Recent Labs Lab 01/09/15 1850  AST 28  ALT 20  ALKPHOS 141*  BILITOT 1.2  PROT 7.6  ALBUMIN 3.4*   No results for input(s): LIPASE, AMYLASE in the last 168 hours. No results for input(s): AMMONIA in the last 168 hours. CBC:  Recent Labs Lab 01/09/15 1850  WBC 6.0  HGB 10.9*  HCT 33.5*  MCV 105.7*  PLT 148*   Cardiac Enzymes:  Recent Labs Lab 01/09/15 1850  TROPONINI 0.06*    BNP (last 3 results)  Recent Labs  01/09/15 1850  BNP 371.0*    ProBNP (last 3 results) No results for input(s): PROBNP in the last 8760 hours.  CBG: No results for input(s): GLUCAP in the last 168 hours.  Radiological Exams on Admission: Dg Abd Acute W/chest  01/09/2015   CLINICAL DATA:  Shortness of breath. Generalize weakness, palpitations, and abdominal swelling for several days.  EXAM: DG ABDOMEN ACUTE W/ 1V CHEST  COMPARISON:  Chest radiographs 12/13/2013. CT abdomen and pelvis 10/05/2013.  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 Cardiac silhouette remains enlarged. There is persistent opacity in the  right lung base obscuring the costophrenic angle, likely reflecting a combination of pleural effusion atelectasis as previously described. Right lung base aeration appears slightly improved in the interim. There is progressive opacity in the left lung base with blunting of the costophrenic angle suggestive of enlarging, small to moderate left pleural effusion with left basilar atelectasis versus consolidation. No pneumothorax is identified. There is mild S-shaped thoracolumbar scoliosis.  There is no evidence of intraperitoneal free air. Right upper quadrant abdominal surgical clips are noted. There is a moderate amount of colonic stool. Gas is present in colon with a small amount of small bowel gas present as well. There is a single minimally prominent air-fluid level in the right upper quadrant without other evidence of bowel dilatation or obstruction. A small amount of and stool are present in the rectum.  IMPRESSION: 1. Right pleural effusion with mildly improved aeration of the right lung base. 2. Small to moderate left pleural effusion, increased from prior, with left basilar atelectasis versus consolidation. 3. Moderate amount of colonic stool. No evidence of bowel obstruction.   Electronically Signed   By: Logan Bores   On: 01/09/2015 19:55      Assessment/Plan   1. Congestive heart failure, systolic. He does have a history of ischemic cardiomyopathy. We will obtain echocardiogram again. Start Lasix intravenously, monitor input/output and daily weights. We will ask cardiology to see the patient in consultation. 2. Atrial fibrillation, chronic. He has deemed not to be a candidate for chronic anticoagulation therapy in the past. 3. Chronic kidney disease. Monitor renal function closely. We will ask nephrology to see in consultation. 4. Diabetes. Continue with home medications and sliding scale. 5. Hypertension. Stable.  Further recommendations will depend on patient's hospital progress.   Code  Status: Full code.   DVT Prophylaxis: Lovenox.  Family Communication: I discussed the plan with the patient at the bedside.   Disposition Plan: Home when medically stable  Time spent: 60 minutes.  Doree Albee [facility] Pager [phone].

## 2015-01-09 NOTE — ED Notes (Signed)
Pt reports generalized weakness,palpitations today and reports "abdominal swelling" for last several days. Pt denies any cp,sob. nad noted.

## 2015-01-09 NOTE — ED Provider Notes (Signed)
CSN: [id number]     Arrival date & time 01/09/15  1821 History   First MD Initiated Contact with Patient 01/09/15 1842     Chief Complaint  Patient presents with  . Palpitations     (Consider location/radiation/quality/duration/timing/severity/associated sxs/prior Treatment) Patient is a 74 y.o. male presenting with palpitations. The history is provided by the patient (the pt complains of palpitations and abd swelling with sob on exirtion).  Palpitations Palpitations quality:  Irregular Onset quality:  Sudden Timing:  Constant Progression:  Waxing and waning Chronicity:  Recurrent Context: not anxiety   Associated symptoms: no back pain, no chest pain and no cough     Past Medical History  Diagnosis Date  . 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 Diabetes mellitus   . Macrocytosis     normal B12 & folate levels  . Ischemic cardiomyopathy     EF of 45-50% - posterior & inferior walls are severly hypokinetic  . Atrial fibrillation     not candidate for chronic anticoagulation with Coumadin given chrronic epistaxis as well as hemorrhagic conversion of his acute cva   . Hyperlipidemia   . Hypertension   . Complication of anesthesia   . PONV (postoperative nausea and vomiting)   . Acute renal insufficiency     resolved  . Myocardial infarct   . CHF (congestive heart failure)   . Shortness of breath   . Pleural effusion 10/05/2013  . Tubular adenoma    Past Surgical History  Procedure Laterality Date  . Coronary artery bypass graft    . Tonsillectomy    . Esophagogastroduodenoscopy (egd) with esophageal dilation N/A 10/27/2012    Dr. Gala Romney: distal esophageal diverticulum, non-complete Schatzki's ring s/p dilation, distal esophageal nodule with benign path, negative Barrett's  . Cholecystectomy  2008  . Colonoscopy N/A 12/23/2012    Dr. Gala Romney: multiple rectal and colonic polyps removed, tubular adenomas.  needs surveillance April 2017.   . Cardiac surgery    . Video assisted thoracoscopy Right 10/31/2013    Procedure: VIDEO ASSISTED THORACOSCOPY;  Surgeon: Melrose Nakayama, MD;  Location: [place];  Service: Thoracic;  Laterality: Right;  . Pleural effusion drainage Right 10/31/2013    Procedure: DRAINAGE OF PLEURAL EFFUSION;  Surgeon: Melrose Nakayama, MD;  Location: [place];  Service: Thoracic;  Laterality: Right;  . Chest tube insertion Right 10/31/2013    Procedure: INSERTION PLEURAL DRAINAGE CATHETER;  Surgeon: Melrose Nakayama, MD;  Location: [place];  Service: Thoracic;  Laterality: Right;   Family History  Problem Relation Age of Onset  . Colon cancer Neg Hx   . Colon polyps Sister   . Colon polyps Sister    History  Substance Use Topics  . Smoking status: Never Smoker   . Smokeless tobacco: Never Used  . Alcohol Use: No    Review of Systems  Constitutional: Negative for appetite change and fatigue.  HENT: Negative for congestion, ear discharge and sinus pressure.   Eyes: Negative for discharge.  Respiratory: Negative for cough.        Sob  Cardiovascular: Positive for palpitations. Negative for chest pain.  Gastrointestinal: Negative for abdominal pain and diarrhea.  Genitourinary: Negative for frequency and hematuria.  Musculoskeletal: Negative for back pain.       Swelling in lower legs  Skin: Negative for rash.  Neurological: Negative for seizures and headaches.  Psychiatric/Behavioral: Negative for hallucinations.  Allergies  Ciprofloxacin; Finasteride; Lisinopril; Metoclopramide; Protonix; and Xanax  Home Medications   Prior to Admission medications   Medication Sig Start Date End Date Taking? Authorizing Provider  aspirin EC 81 MG tablet Take 81 mg by mouth daily.   Yes Historical Provider, MD  Cholecalciferol (VITAMIN D-3) 5000 UNITS TABS Take 5,000 Units by mouth daily.    Yes Historical Provider, MD  furosemide (LASIX) 40 MG tablet Take 0.5 tablets  (20 mg total) by mouth 2 (two) times daily. Patient taking differently: Take 40 mg by mouth daily as needed for fluid.  10/13/13  Yes Kinnie Feil, MD  Insulin Detemir (LEVEMIR FLEXPEN) 100 UNIT/ML Pen Inject 20 Units into the skin daily. Patient taking differently: Inject 50 Units into the skin daily.  11/04/13  Yes Gina L Collins, PA-C  LORazepam (ATIVAN) 0.5 MG tablet Take 0.5 mg by mouth 2 (two) times daily as needed for anxiety.   Yes Historical Provider, MD  AMITIZA 24 MCG capsule Take 24 mcg by mouth daily with breakfast.  12/13/13   Historical Provider, MD  atorvastatin (LIPITOR) 40 MG tablet Take 40 mg by mouth daily at 6 PM.  01/07/14   Historical Provider, MD  lactobacillus acidophilus (BACID) TABS tablet Take 1 tablet by mouth daily.    Historical Provider, MD  metoprolol (LOPRESSOR) 50 MG tablet Take 50 mg by mouth 2 (two) times daily.    Historical Provider, MD  ondansetron (ZOFRAN) 4 MG tablet Take 1 tablet (4 mg total) by mouth every 8 (eight) hours as needed for nausea or vomiting. 12/01/13   Orvil Feil, NP  pantoprazole (PROTONIX) 40 MG tablet Take 1 tablet (40 mg total) by mouth 2 (two) times daily before a meal. 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    Historical Provider, MD  traMADol (ULTRAM) 50 MG tablet Take 50 mg by mouth every 12 (twelve) hours as needed for severe pain.    Historical Provider, MD   BP 151/86 mmHg  Pulse 72  Temp(Src) 97.9 F (36.6 C) (Oral)  Resp 18  Ht 6\' 1"  (1.854 m)  Wt 213 lb (96.616 kg)  BMI 28.11 kg/m2  SpO2 94% Physical Exam  Constitutional: He is oriented to person, place, and time. He appears well-developed.  HENT:  Head: Normocephalic.  Eyes: Conjunctivae and EOM are normal. No scleral icterus.  Neck: Neck supple. No thyromegaly present.  Cardiovascular: Normal rate.  Exam reveals no gallop and  no friction rub.   No murmur heard. Irregular rate  Pulmonary/Chest: No stridor. He has no wheezes. He has no rales. He exhibits no tenderness.  Abdominal: He exhibits distension. There is no tenderness. There is no rebound.  Musculoskeletal: Normal range of motion. He exhibits edema.  Lymphadenopathy:    He has no cervical adenopathy.  Neurological: He is oriented to person, place, and time. He exhibits normal muscle tone. Coordination normal.  Skin: No rash noted. No erythema.  Psychiatric: He has a normal mood and affect. His behavior is normal.    ED Course  Procedures (including critical care time) Labs Review Labs Reviewed  CBC - Abnormal; Notable for the following:    RBC 3.17 (*)    Hemoglobin 10.9 (*)    HCT 33.5 (*)    MCV 105.7 (*)    MCH 34.4 (*)    Platelets  148 (*)    All other components within normal limits  BASIC METABOLIC PANEL - Abnormal; Notable for the following:    Glucose, Bld 231 (*)    BUN 48 (*)    Creatinine, Ser 2.49 (*)    GFR calc non Af Amer 24 (*)    GFR calc Af Amer 28 (*)    All other components within normal limits  HEPATIC FUNCTION PANEL - Abnormal; Notable for the following:    Albumin 3.4 (*)    Alkaline Phosphatase 141 (*)    All other components within normal limits  BRAIN NATRIURETIC PEPTIDE - Abnormal; Notable for the following:    B Natriuretic Peptide 371.0 (*)    All other components within normal limits  TROPONIN I - Abnormal; Notable for the following:    Troponin I 0.06 (*)    All other components within normal limits    Imaging Review Dg Abd Acute W/chest  01/09/2015   CLINICAL DATA:  Shortness of breath. Generalize weakness, palpitations, and abdominal swelling for several days.  EXAM: DG ABDOMEN ACUTE W/ 1V CHEST  COMPARISON:  Chest radiographs 12/13/2013. CT abdomen and pelvis 10/05/2013.  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 Cardiac silhouette remains enlarged. There is persistent opacity in the right  lung base obscuring the costophrenic angle, likely reflecting a combination of pleural effusion atelectasis as previously described. Right lung base aeration appears slightly improved in the interim. There is progressive opacity in the left lung base with blunting of the costophrenic angle suggestive of enlarging, small to moderate left pleural effusion with left basilar atelectasis versus consolidation. No pneumothorax is identified. There is mild S-shaped thoracolumbar scoliosis.  There is no evidence of intraperitoneal free air. Right upper quadrant abdominal surgical clips are noted. There is a moderate amount of colonic stool. Gas is present in colon with a small amount of small bowel gas present as well. There is a single minimally prominent air-fluid level in the right upper quadrant without other evidence of bowel dilatation or obstruction. A small amount of and stool are present in the rectum.  IMPRESSION: 1. Right pleural effusion with mildly improved aeration of the right lung base. 2. Small to moderate left pleural effusion, increased from prior, with left basilar atelectasis versus consolidation. 3. Moderate amount of colonic stool. No evidence of bowel obstruction.   Electronically Signed   By: Logan Bores   On: 01/09/2015 19:55     EKG Interpretation   Date/Time:  Tuesday January 09 2015 18:25:29 EDT Ventricular Rate:  87 PR Interval:    QRS Duration: 118 QT Interval:  408 QTC Calculation: 490 R Axis:   113 Text Interpretation:  Atrial fibrillation with a competing junctional  pacemaker with premature ventricular or aberrantly conducted complexes  Right axis deviation Incomplete right bundle branch block Anterior infarct  , age undetermined Abnormal ECG Confirmed by Daviel Allegretto  MD, Micheala Morissette [phone]) on  01/09/2015 7:25:43 PM      MDM   Final diagnoses:  SOB (shortness of breath)  Acute systolic congestive heart failure    Admit,  chf and afib    Milton Ferguson, MD 01/09/15  2112

## 2015-01-10 DIAGNOSIS — J90 Pleural effusion, not elsewhere classified: Secondary | ICD-10-CM

## 2015-01-10 DIAGNOSIS — I482 Chronic atrial fibrillation: Secondary | ICD-10-CM

## 2015-01-10 DIAGNOSIS — I10 Essential (primary) hypertension: Secondary | ICD-10-CM

## 2015-01-10 DIAGNOSIS — N184 Chronic kidney disease, stage 4 (severe): Secondary | ICD-10-CM

## 2015-01-10 DIAGNOSIS — I5043 Acute on chronic combined systolic (congestive) and diastolic (congestive) heart failure: Secondary | ICD-10-CM

## 2015-01-10 DIAGNOSIS — I509 Heart failure, unspecified: Secondary | ICD-10-CM

## 2015-01-10 LAB — CBC
HCT: 32.2 % — ABNORMAL LOW (ref 39.0–52.0)
Hemoglobin: 10.5 g/dL — ABNORMAL LOW (ref 13.0–17.0)
MCH: 34.2 pg — ABNORMAL HIGH (ref 26.0–34.0)
MCHC: 32.6 g/dL (ref 30.0–36.0)
MCV: 104.9 fL — AB (ref 78.0–100.0)
PLATELETS: 134 10*3/uL — AB (ref 150–400)
RBC: 3.07 MIL/uL — ABNORMAL LOW (ref 4.22–5.81)
RDW: 13.7 % (ref 11.5–15.5)
WBC: 5 10*3/uL (ref 4.0–10.5)

## 2015-01-10 LAB — GLUCOSE, CAPILLARY
Glucose-Capillary: 139 mg/dL — ABNORMAL HIGH (ref 70–99)
Glucose-Capillary: 159 mg/dL — ABNORMAL HIGH (ref 70–99)
Glucose-Capillary: 186 mg/dL — ABNORMAL HIGH (ref 70–99)
Glucose-Capillary: 209 mg/dL — ABNORMAL HIGH (ref 70–99)
Glucose-Capillary: 76 mg/dL (ref 70–99)

## 2015-01-10 LAB — COMPREHENSIVE METABOLIC PANEL
ALT: 20 U/L (ref 0–53)
AST: 25 U/L (ref 0–37)
Albumin: 3.2 g/dL — ABNORMAL LOW (ref 3.5–5.2)
Alkaline Phosphatase: 127 U/L — ABNORMAL HIGH (ref 39–117)
Anion gap: 6 (ref 5–15)
BILIRUBIN TOTAL: 1.3 mg/dL — AB (ref 0.3–1.2)
BUN: 48 mg/dL — ABNORMAL HIGH (ref 6–23)
CALCIUM: 8.4 mg/dL (ref 8.4–10.5)
CHLORIDE: 105 mmol/L (ref 96–112)
CO2: 29 mmol/L (ref 19–32)
Creatinine, Ser: 2.33 mg/dL — ABNORMAL HIGH (ref 0.50–1.35)
GFR calc Af Amer: 30 mL/min — ABNORMAL LOW (ref >90)
GFR calc non Af Amer: 26 mL/min — ABNORMAL LOW (ref >90)
Glucose, Bld: 115 mg/dL — ABNORMAL HIGH (ref 70–99)
POTASSIUM: 3.5 mmol/L (ref 3.5–5.1)
Sodium: 140 mmol/L (ref 135–145)
Total Protein: 6.9 g/dL (ref 6.0–8.3)

## 2015-01-10 LAB — TROPONIN I
TROPONIN I: 0.06 ng/mL — AB (ref <0.031)
Troponin I: 0.06 ng/mL — ABNORMAL HIGH (ref <0.031)

## 2015-01-10 MED ORDER — PERFLUTREN LIPID MICROSPHERE
1.0000 mL | INTRAVENOUS | Status: DC | PRN
  Filled 2015-01-10: qty 10

## 2015-01-10 MED ORDER — ENOXAPARIN SODIUM 40 MG/0.4ML ~~LOC~~ SOLN
40.0000 mg | Freq: Every day | SUBCUTANEOUS | Status: DC
  Filled 2015-01-10: qty 0.4

## 2015-01-10 MED ORDER — PERFLUTREN LIPID MICROSPHERE
1.0000 mL | INTRAVENOUS | Status: AC | PRN
  Administered 2015-01-10: 6 mL via INTRAVENOUS
  Filled 2015-01-10: qty 10

## 2015-01-10 NOTE — Progress Notes (Signed)
UR chart review completed.  

## 2015-01-10 NOTE — Progress Notes (Signed)
[facility]                                                                              Patient Demographics  Chris [name], is a 74 y.o. male, DOB - [birth year], PYP:[id number]  Admit date - 01/09/2015   Admitting Physician Doree Albee, MD  Outpatient Primary MD for the patient is Gar Ponto, MD  LOS - 1   Chief Complaint  Patient presents with  . Palpitations      HPI on 01/09/2015 by Dr. Hurshel Party This is a 74 year old man who has a history of recurrent pleural effusions and has had thoracentesis several times, now presents with palpitations which are irregular along with leg swelling and degree of dyspnea. He denies any chest pain. He does describe some abdominal swelling. He has discontinued taking Lasix apparently a few months ago. He has a history of atrial fibrillation but has not been considered a candidate for chronic anticoagulations because of history of hemorrhagic conversion of an acute CVA along with history of chronic epistaxis. He is now being admitted for further management of his symptoms. Primarily it is felt that he has congestive heart failure.  Assessment & Plan   Acute systolic congestive heart failure -Patient feels improved from admission. -Echocardiogram in 2014: EF 50-55% -BNP 371 -Continue diuresis, monitor daily weights, intake and output  Atrial fibrillation, chronic  -Not anticoagulation candidate due to history of hemorrhagic CVA and nosebleeds -Currently rate controlled, continue metoprolol  Chronic kidney disease, stage 3 -Creatinine appears to be stable, continue to monitor BMP -Nephrology consulted and appreciated  Diabetes mellitus, type 2 -Continue levemir, ISS, and CBG monitoring   Hypertension -Continue diuresis  Recurrent pleural effusions -Patient has had multiple thoracenteses, Plavix catheter placement in February 2015 by Dr. Roxan Hockey -CT showed small to moderate left pleural effusion increased from prior,  right pleural effusion with mildly improved aeration  Code Status: Full  Family Communication: Wife at bedside  Disposition Plan: Admitted, pending echocardiogram  Time Spent in minutes   30 minutes  Procedures  Echocardiogram  Consults   Cardiology Nephrology  DVT Prophylaxis  SCDs  Lab Results  Component Value Date   PLT 134* 01/10/2015    Medications  Scheduled Meds: . acidophilus  1 capsule Oral Daily  . aspirin EC  81 mg Oral Daily  . atorvastatin  40 mg Oral q1800  . cholecalciferol  5,000 Units Oral Daily  . enoxaparin (LOVENOX) injection  30 mg Subcutaneous Q24H  . furosemide  40 mg Intravenous BID  . insulin aspart  0-5 Units Subcutaneous QHS  . insulin aspart  0-9 Units Subcutaneous TID WC  . insulin detemir  20 Units Subcutaneous Daily  . lubiprostone  24 mcg Oral Q breakfast  . metoprolol  50 mg Oral BID  . pantoprazole  40 mg Oral BID AC  . sodium chloride  3 mL Intravenous Q12H   Continuous Infusions:  PRN Meds:.LORazepam, ondansetron **OR** ondansetron (ZOFRAN) IV, ondansetron, prednisoLONE acetate, traMADol  Antibiotics    Anti-infectives    None      Subjective:   Chris [name] seen and examined today.  Patient states his breathing has improved.  He denies  chest pain, abdominal pain.  States he does not want to take amitiza or protonix because it causes him abdominal pain and constipation.   States he came to the ER to see a dentist because his fillings fell out.    Objective:   Filed Vitals:   01/09/15 2130 01/09/15 2200 01/09/15 2305 01/10/15 0952  BP: 163/94 145/90 142/71 136/52  Pulse: 87 70 66 85  Temp:   98 F (36.7 C) 97.4 F (36.3 C)  TempSrc:   Oral Oral  Resp: 12 19 16 18   Height:      Weight:   100.789 kg (222 lb 3.2 oz)   SpO2: 96% 95% 97% 96%    Wt Readings from Last 3 Encounters:  01/09/15 100.789 kg (222 lb 3.2 oz)  01/10/14 96.163 kg (212 lb)  12/13/13 97.07 kg (214 lb)     Intake/Output Summary (Last 24  hours) at 01/10/15 1332 Last data filed at 01/10/15 0930  Gross per 24 hour  Intake    120 ml  Output    700 ml  Net   -580 ml    Exam  General: Well developed, well nourished, NAD, appears stated age  HEENT: NCAT, mucous membranes moist.   Neck: Supple, no JVD, no masses  Cardiovascular: S1 S2 auscultated,2/6 SEM, Irregular  Respiratory: Decreased breath sounds in LE B/L  Abdomen: Soft, nontender, nondistended, + bowel sounds  Extremities: warm dry without cyanosis clubbing.  2+ edema LE B/L  Neuro: AAOx3, nonfocal  Psych: Normal affect and demeanor   Data Review   Micro Results No results found for this or any previous visit (from the past 240 hour(s)).  Radiology Reports Dg Abd Acute W/chest  01/09/2015   CLINICAL DATA:  Shortness of breath. Generalize weakness, palpitations, and abdominal swelling for several days.  EXAM: DG ABDOMEN ACUTE W/ 1V CHEST  COMPARISON:  Chest radiographs 12/13/2013. CT abdomen and pelvis 10/05/2013.  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 Cardiac silhouette remains enlarged. There is persistent opacity in the right lung base obscuring the costophrenic angle, likely reflecting a combination of pleural effusion atelectasis as previously described. Right lung base aeration appears slightly improved in the interim. There is progressive opacity in the left lung base with blunting of the costophrenic angle suggestive of enlarging, small to moderate left pleural effusion with left basilar atelectasis versus consolidation. No pneumothorax is identified. There is mild S-shaped thoracolumbar scoliosis.  There is no evidence of intraperitoneal free air. Right upper quadrant abdominal surgical clips are noted. There is a moderate amount of colonic stool. Gas is present in colon with a small amount of small bowel gas present as well. There is a single minimally prominent air-fluid level in the right upper quadrant without other evidence of bowel  dilatation or obstruction. A small amount of and stool are present in the rectum.  IMPRESSION: 1. Right pleural effusion with mildly improved aeration of the right lung base. 2. Small to moderate left pleural effusion, increased from prior, with left basilar atelectasis versus consolidation. 3. Moderate amount of colonic stool. No evidence of bowel obstruction.   Electronically Signed   By: Logan Bores   On: 01/09/2015 19:55    CBC  Recent Labs Lab 01/09/15 1850 01/10/15 0337  WBC 6.0 5.0  HGB 10.9* 10.5*  HCT 33.5* 32.2*  PLT 148* 134*  MCV 105.7* 104.9*  MCH 34.4* 34.2*  MCHC 32.5 32.6  RDW 13.6 13.7    Chemistries   Recent Labs  Lab 01/09/15 1850 01/10/15 0337  NA 138 140  K 4.4 3.5  CL 101 105  CO2 28 29  GLUCOSE 231* 115*  BUN 48* 48*  CREATININE 2.49* 2.33*  CALCIUM 8.5 8.4  AST 28 25  ALT 20 20  ALKPHOS 141* 127*  BILITOT 1.2 1.3*   ------------------------------------------------------------------------------------------------------------------ estimated creatinine clearance is 34.2 mL/min (by C-G formula based on Cr of 2.33). ------------------------------------------------------------------------------------------------------------------ No results for input(s): HGBA1C in the last 72 hours. ------------------------------------------------------------------------------------------------------------------ No results for input(s): CHOL, HDL, LDLCALC, TRIG, CHOLHDL, LDLDIRECT in the last 72 hours. ------------------------------------------------------------------------------------------------------------------  Recent Labs  01/09/15 1900  TSH 1.555   ------------------------------------------------------------------------------------------------------------------ No results for input(s): VITAMINB12, FOLATE, FERRITIN, TIBC, IRON, RETICCTPCT in the last 72 hours.  Coagulation profile No results for input(s): INR, PROTIME in the last 168 hours.  No results  for input(s): DDIMER in the last 72 hours.  Cardiac Enzymes  Recent Labs Lab 01/09/15 2205 01/10/15 0337 01/10/15 0943  TROPONINI 0.06* 0.06* 0.06*   ------------------------------------------------------------------------------------------------------------------ Invalid input(s): POCBNP    Chris [name] D.O. on 01/10/2015 at 1:32 PM  Between 7am to 7pm - Pager - [phone]  After 7pm go to [email or web address] - password [email or web address]  And look for the night coverage person covering for me after hours  [facility] Office  [phone]

## 2015-01-10 NOTE — Progress Notes (Signed)
  Echocardiogram 2D Echocardiogram has been performed.  Samuel Germany 01/10/2015, 3:39 PM

## 2015-01-10 NOTE — Progress Notes (Signed)
  Echocardiogram 2D Echocardiogram has been performed.  Samuel Germany 01/10/2015, 2:07 PM

## 2015-01-10 NOTE — Consult Note (Signed)
Chris [name] MRN: [id number] DOB/AGE: 1941/05/18 74 y.o. Primary Care Physician:DANIEL, TERRY, MD Admit date: 01/09/2015 Chief Complaint:  Chief Complaint  Patient presents with  . Palpitations   HPI: Pt is 74 year old male with past medical hx of CHF who came to Er with c/o palpitations.  HPI dates back to yesterday when pt had sudden onset of palpitations, constant in duration, with waxing and waning course. Upon evaluation in Er pt was found to be in Afib abd admitted. Pt also c/o Le edema, increasing from past few weeks  Pt also c/o dyspnea, increases on exertion Pt also c/o increasing abdominal bloated ness  No c/o chest pain No c/o fever/ciough/ chills  No c/o abdominal pain  no c/o syncope No c/o hematuria      Past Medical History  Diagnosis Date  . 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 Diabetes mellitus   . Macrocytosis     normal B12 & folate levels  . Ischemic cardiomyopathy     EF of 45-50% - posterior & inferior walls are severly hypokinetic  . Atrial fibrillation     not candidate for chronic anticoagulation with Coumadin given chrronic epistaxis as well as hemorrhagic conversion of his acute cva   . Hyperlipidemia   . Hypertension   . Complication of anesthesia   . PONV (postoperative nausea and vomiting)   . Acute renal insufficiency     resolved  . Myocardial infarct   . CHF (congestive heart failure)   . Shortness of breath   . Pleural effusion 10/05/2013  . Tubular adenoma       Family History  Problem Relation Age of Onset  . Colon cancer Neg Hx   . Colon polyps Sister   . Colon polyps Sister     Social History:  reports that he has never smoked. He has never used smokeless tobacco. He reports that he does not drink alcohol or use illicit drugs.   Allergies:  Allergies  Allergen Reactions  . Ciprofloxacin Other (See Comments)    Sores all on legs.   .  Finasteride     Affected  Patients testicles  . Lisinopril Other (See Comments)    Kidney damage    . Metoclopramide     Jerking all over  . Protonix [Pantoprazole Sodium]     abd pain  . Xanax [Alprazolam] Other (See Comments)    Not in right state of mind.     Medications Prior to Admission  Medication Sig Dispense Refill  . aspirin EC 81 MG tablet Take 81 mg by mouth daily.    . Cholecalciferol (VITAMIN D-3) 5000 UNITS TABS Take 5,000 Units by mouth daily.     . furosemide (LASIX) 40 MG tablet Take 0.5 tablets (20 mg total) by mouth 2 (two) times daily. (Patient taking differently: Take 40 mg by mouth daily as needed for fluid. ) 30 tablet 1  . Insulin Detemir (LEVEMIR FLEXPEN) 100 UNIT/ML Pen Inject 20 Units into the skin daily. (Patient taking differently: Inject 50 Units into the skin daily. ) 2 pen 2  . LORazepam (ATIVAN) 0.5 MG tablet Take 0.5 mg by mouth 2 (two) times daily as needed for anxiety.    . AMITIZA 24 MCG capsule Take 24 mcg by mouth daily with breakfast.     . atorvastatin (LIPITOR) 40 MG tablet Take 40 mg by mouth daily at 6 PM.     .  lactobacillus acidophilus (BACID) TABS tablet Take 1 tablet by mouth daily.    . metoprolol (LOPRESSOR) 50 MG tablet Take 50 mg by mouth 2 (two) times daily.    . ondansetron (ZOFRAN) 4 MG tablet Take 1 tablet (4 mg total) by mouth every 8 (eight) hours as needed for nausea or vomiting. 30 tablet 1  . pantoprazole (PROTONIX) 40 MG tablet Take 1 tablet (40 mg total) by mouth 2 (two) times daily before a meal. 60 tablet 3  . 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    . traMADol (ULTRAM) 50 MG tablet Take 50 mg by mouth every 12 (twelve) hours as needed for severe pain.         [id number] from the symptoms mentioned above,there are no other symptoms referable to all systems reviewed.  Marland Kitchen acidophilus  1 capsule Oral Daily   . aspirin EC  81 mg Oral Daily  . atorvastatin  40 mg Oral q1800  . cholecalciferol  5,000 Units Oral Daily  . enoxaparin (LOVENOX) injection  30 mg Subcutaneous Q24H  . furosemide  40 mg Intravenous BID  . insulin aspart  0-5 Units Subcutaneous QHS  . insulin aspart  0-9 Units Subcutaneous TID WC  . insulin detemir  20 Units Subcutaneous Daily  . lubiprostone  24 mcg Oral Q breakfast  . metoprolol  50 mg Oral BID  . pantoprazole  40 mg Oral BID AC  . sodium chloride  3 mL Intravenous Q12H      Physical Exam: Vital signs in last 24 hours: Temp:  [97.9 F (36.6 C)-98 F (36.7 C)] 98 F (36.7 C) (04/19 2305) Pulse Rate:  [65-87] 66 (04/19 2305) Resp:  [12-21] 16 (04/19 2305) BP: (142-163)/(71-94) 142/71 mmHg (04/19 2305) SpO2:  [94 %-98 %] 97 % (04/19 2305) Weight:  [213 lb (96.616 kg)-222 lb 3.2 oz (100.789 kg)] 222 lb 3.2 oz (100.789 kg) (04/19 2305) Weight change:  Last BM Date: 01/08/15  Intake/Output from previous day: 04/19 0701 - 04/20 0700 In: -  Out: 700 [Urine:700]     Physical Exam: General- pt is awake,alert, oriented to time place and person Resp- No acute REsp distress, Decreased Bs at left base. CVS- S1S2 irregular in rate and rhythm GIT- BS+, soft, NT, ND EXT- 1+LE Edema,  No Cyanosis CNS- CN 2-12 grossly intact. Moving all 4 extremities Psych- normal mood and affect    Lab Results: CBC  Recent Labs  01/09/15 1850 01/10/15 0337  WBC 6.0 5.0  HGB 10.9* 10.5*  HCT 33.5* 32.2*  PLT 148* 134*    BMET  Recent Labs  01/09/15 1850 01/10/15 0337  NA 138 140  K 4.4 3.5  CL 101 105  CO2 28 29  GLUCOSE 231* 115*  BUN 48* 48*  CREATININE 2.49* 2.33*  CALCIUM 8.5 8.4    Trend Creat 2016 2.3-2.5 2015 5.12==>3.72=>3.46=>2.3 ( AKI ) 2014 1.7--2.3 2010 1.9--2.6   MICRO No results found for this or any previous visit (from the past 240 hour(s)).    Lab Results  Component Value Date   PTH 90.7* 10/08/2013   CALCIUM 8.4  01/10/2015   PHOS 3.5 10/11/2013      Impression: 1)Renal creat at baseline                CKD stage 3b/4 .  CKD since 2010 ( Most likley before that)  CKD  secondary to multiple factors                               Cardiorenal                               DM                               Ischemic Nephropathy                               HTN  Progression of CKD marked with AKI  Proteinura will check.   2)HTN   Medication- On Diuretics- On Beta blockers   3)Anemia HGb at goal (9--11) GI work up Colonoscopy  4)CKD Mineral-Bone Disorder PTH acceptable. Secondary Hyperparathyroidism Present. Phosphorus at goal. Vitamin D Def- on replcement  5)[facility]- admitted with A fib Primary MD following  6)Electrolytes Normokalemic NOrmonatremic   7)Acid base Co2 at goal  8) Resp- hx of ch Pleural effusion stable   Plan:  Agree with current tx and plan Agree with IV lasix Pt was not taking any diuretics and now on Iv 40mg  BID Will suggest to follow Bmet Will ask for proteinuria quantification       Christeen Lai S 01/10/2015, 9:06 AM

## 2015-01-10 NOTE — Care Management Note (Addendum)
    Page 1 of 2   01/11/2015     10:52:21 AM CARE MANAGEMENT NOTE 01/11/2015  Patient:  Chris [name], Chris [name]   Account Number:  [id number]  Date Initiated:  01/10/2015  Documentation initiated by:  Theophilus Kinds  Subjective/Objective Assessment:   Pt admitted from home with CHF. Pt lives with his wife and will return home at discharge. Pt uses a cane for home use but states that he is independent with ADL's.     Action/Plan:   Referral made for [facility] (has followed pt in the past). Will also arrange [facility] RN and PT at discharge with [facility] (has used in the in past).   Anticipated DC Date:  01/12/2015   Anticipated DC Plan:  [place]  CM consult      [facility] Choice  HOME HEALTH   Choice offered to / List presented to:  [name]        [place] arranged  HH-1 RN  [place] PT      [place].   Status of service:  Completed, signed off Medicare Important Message given?  NO (If response is "NO", the following Medicare IM given date fields will be blank) Date Medicare IM given:   Medicare IM given by:   Date Additional Medicare IM given:   Additional Medicare IM given by:    Discharge Disposition:  [place]  Per UR Regulation:    If discussed at Long Length of Stay Meetings, dates discussed:    Comments:  01/11/15 [place], RN BSN CM Pt discharged home today with [facility] RN and PT (per pts choice). Romualdo Bolk of [facility] is aware and will collect the pts information from the chart. HH servcies to start within 48 hours of discharge. [facility] referral made as well. Pt and pts nurse aware of discharge arrangements.  01/10/15 [place], RN BSN CM

## 2015-01-10 NOTE — Consult Note (Signed)
Reason for Consult:CHF Referring Physician: [facility] Cardiologist: Dr. Francella Solian. [place].  Chris [name] is an 74 y.o. male.  HPI: This is a 74 yr old with history of recurrent pleural effusion S/P multiple thoracentesis and batch drainage and Pleurx catheter placement 10/2013 by Dr. Roxan Hockey, chronic atrial fibrillation, not on coumadin because of hemorrhagic conversion of an acute CVA and history of chronic epistaxis. He has CAD S/P CABG 2002 LIMA to LAD, SVG to Sx, SVG to PDA and PLA, chronic renal insufficiency, HL, DM. 2Decho 2014 EF 50-55% with mild LVH. Repeat pending.  Patient presented with orthopnea, abdominal swelling, and palpitations. He used to be on Lasix BID until 4 months ago when Dr. Vertell Novak decreased it to once daily and prn for edema. He says it has gradually built up. He doesn't notice his atrial fibrillation most of the time unless it gets fast. He used to see Dr. Dannielle Burn but is now followed by Dr. Karilyn Cota with [facility]. Troponins elevated at 0.06 x 3, BNP 371.Crt 2.33.     Past Medical History  Diagnosis Date  . 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 Diabetes mellitus   . Macrocytosis     normal B12 & folate levels  . Ischemic cardiomyopathy     EF of 45-50% - posterior & inferior walls are severly hypokinetic  . Atrial fibrillation     not candidate for chronic anticoagulation with Coumadin given chrronic epistaxis as well as hemorrhagic conversion of his acute cva   . Hyperlipidemia   . Hypertension   . Complication of anesthesia   . PONV (postoperative nausea and vomiting)   . Acute renal insufficiency     resolved  . Myocardial infarct   . CHF (congestive heart failure)   . Shortness of breath   . Pleural effusion 10/05/2013  . Tubular adenoma     Past Surgical History  Procedure Laterality Date  . Coronary artery bypass graft    . Tonsillectomy    . Esophagogastroduodenoscopy  (egd) with esophageal dilation N/A 10/27/2012    Dr. Gala Romney: distal esophageal diverticulum, non-complete Schatzki's ring s/p dilation, distal esophageal nodule with benign path, negative Barrett's  . Cholecystectomy  2008  . Colonoscopy N/A 12/23/2012    Dr. Gala Romney: multiple rectal and colonic polyps removed, tubular adenomas. needs surveillance April 2017.   . Cardiac surgery    . Video assisted thoracoscopy Right 10/31/2013    Procedure: VIDEO ASSISTED THORACOSCOPY;  Surgeon: Melrose Nakayama, MD;  Location: [facility];  Service: Thoracic;  Laterality: Right;  . Pleural effusion drainage Right 10/31/2013    Procedure: DRAINAGE OF PLEURAL EFFUSION;  Surgeon: Melrose Nakayama, MD;  Location: [facility];  Service: Thoracic;  Laterality: Right;  . Chest tube insertion Right 10/31/2013    Procedure: INSERTION PLEURAL DRAINAGE CATHETER;  Surgeon: Melrose Nakayama, MD;  Location: [facility];  Service: Thoracic;  Laterality: Right;    Family History  Problem Relation Age of Onset  . Colon cancer Neg Hx   . Colon polyps Sister   . Colon polyps Sister     Social History:  reports that he has never smoked. He has never used smokeless tobacco. He reports that he does not drink alcohol or use illicit drugs.  Allergies:  Allergies  Allergen Reactions  . Ciprofloxacin Other (See Comments)    Sores all on legs.   . Finasteride     Affected  Patients testicles  .  Lisinopril Other (See Comments)    Kidney damage    . Metoclopramide     Jerking all over  . Protonix [Pantoprazole Sodium]     abd pain  . Xanax [Alprazolam] Other (See Comments)    Not in right state of mind.     Medications: Scheduled Meds: . acidophilus  1 capsule Oral Daily  . aspirin EC  81 mg Oral Daily  . atorvastatin  40 mg Oral q1800  . cholecalciferol  5,000 Units Oral Daily  . enoxaparin (LOVENOX) injection  30 mg Subcutaneous Q24H  . furosemide  40 mg Intravenous BID  . insulin aspart  0-5 Units Subcutaneous QHS  . insulin  aspart  0-9 Units Subcutaneous TID WC  . insulin detemir  20 Units Subcutaneous Daily  . lubiprostone  24 mcg Oral Q breakfast  . metoprolol  50 mg Oral BID  . pantoprazole  40 mg Oral BID AC  . sodium chloride  3 mL Intravenous Q12H   Continuous Infusions:  PRN Meds:.LORazepam, ondansetron **OR** ondansetron (ZOFRAN) IV, ondansetron, prednisoLONE acetate, traMADol   Results for orders placed or performed during the hospital encounter of 01/09/15 (from the past 48 hour(s))  CBC  (at [facility])     Status: Abnormal   Collection Time: 01/09/15  6:50 PM  Result Value Ref Range   WBC 6.0 4.0 - 10.5 K/uL   RBC 3.17 (L) 4.22 - 5.81 MIL/uL   Hemoglobin 10.9 (L) 13.0 - 17.0 g/dL   HCT 33.5 (L) 39.0 - 52.0 %   MCV 105.7 (H) 78.0 - 100.0 fL   MCH 34.4 (H) 26.0 - 34.0 pg   MCHC 32.5 30.0 - 36.0 g/dL   RDW 13.6 11.5 - 15.5 %   Platelets 148 (L) 150 - 400 K/uL  Basic metabolic panel  (at [facility])     Status: Abnormal   Collection Time: 01/09/15  6:50 PM  Result Value Ref Range   Sodium 138 135 - 145 mmol/L   Potassium 4.4 3.5 - 5.1 mmol/L   Chloride 101 96 - 112 mmol/L   CO2 28 19 - 32 mmol/L   Glucose, Bld 231 (H) 70 - 99 mg/dL   BUN 48 (H) 6 - 23 mg/dL   Creatinine, Ser 2.49 (H) 0.50 - 1.35 mg/dL   Calcium 8.5 8.4 - 10.5 mg/dL   GFR calc non Af Amer 24 (L) >90 mL/min   GFR calc Af Amer 28 (L) >90 mL/min    Comment: (NOTE) The eGFR has been calculated using the CKD EPI equation. This calculation has not been validated in all clinical situations. eGFR's persistently <90 mL/min signify possible Chronic Kidney Disease.    Anion gap 9 5 - 15  Hepatic function panel     Status: Abnormal   Collection Time: 01/09/15  6:50 PM  Result Value Ref Range   Total Protein 7.6 6.0 - 8.3 g/dL   Albumin 3.4 (L) 3.5 - 5.2 g/dL   AST 28 0 - 37 U/L   ALT 20 0 - 53 U/L   Alkaline Phosphatase 141 (H) 39 - 117 U/L   Total Bilirubin 1.2 0.3 - 1.2 mg/dL   Bilirubin, Direct 0.3  0.0 - 0.5 mg/dL   Indirect Bilirubin 0.9 0.3 - 0.9 mg/dL  Brain natriuretic peptide     Status: Abnormal   Collection Time: 01/09/15  6:50 PM  Result Value Ref Range   B Natriuretic Peptide 371.0 (H) 0.0 - 100.0 pg/mL  Troponin I     Status: Abnormal   Collection Time: 01/09/15  6:50 PM  Result Value Ref Range   Troponin I 0.06 (H) <0.031 ng/mL    Comment:        PERSISTENTLY INCREASED TROPONIN VALUES IN THE RANGE OF 0.04-0.49 ng/mL CAN BE SEEN IN:       -UNSTABLE ANGINA       -CONGESTIVE HEART FAILURE       -MYOCARDITIS       -CHEST TRAUMA       -ARRYHTHMIAS       -LATE PRESENTING MYOCARDIAL INFARCTION       -COPD   CLINICAL FOLLOW-UP RECOMMENDED.   TSH     Status: None   Collection Time: 01/09/15  7:00 PM  Result Value Ref Range   TSH 1.555 0.350 - 4.500 uIU/mL  Troponin I     Status: Abnormal   Collection Time: 01/09/15 10:05 PM  Result Value Ref Range   Troponin I 0.06 (H) <0.031 ng/mL    Comment:        PERSISTENTLY INCREASED TROPONIN VALUES IN THE RANGE OF 0.04-0.49 ng/mL CAN BE SEEN IN:       -UNSTABLE ANGINA       -CONGESTIVE HEART FAILURE       -MYOCARDITIS       -CHEST TRAUMA       -ARRYHTHMIAS       -LATE PRESENTING MYOCARDIAL INFARCTION       -COPD   CLINICAL FOLLOW-UP RECOMMENDED.   Glucose, capillary     Status: Abnormal   Collection Time: 01/10/15 12:24 AM  Result Value Ref Range   Glucose-Capillary 139 (H) 70 - 99 mg/dL   Comment 1 Notify RN    Comment 2 Document in Chart   Troponin I     Status: Abnormal   Collection Time: 01/10/15  3:37 AM  Result Value Ref Range   Troponin I 0.06 (H) <0.031 ng/mL    Comment:        PERSISTENTLY INCREASED TROPONIN VALUES IN THE RANGE OF 0.04-0.49 ng/mL CAN BE SEEN IN:       -UNSTABLE ANGINA       -CONGESTIVE HEART FAILURE       -MYOCARDITIS       -CHEST TRAUMA       -ARRYHTHMIAS       -LATE PRESENTING MYOCARDIAL INFARCTION       -COPD   CLINICAL FOLLOW-UP RECOMMENDED.   Comprehensive metabolic panel      Status: Abnormal   Collection Time: 01/10/15  3:37 AM  Result Value Ref Range   Sodium 140 135 - 145 mmol/L   Potassium 3.5 3.5 - 5.1 mmol/L    Comment: DELTA CHECK NOTED   Chloride 105 96 - 112 mmol/L   CO2 29 19 - 32 mmol/L   Glucose, Bld 115 (H) 70 - 99 mg/dL   BUN 48 (H) 6 - 23 mg/dL   Creatinine, Ser 1.98 (H) 0.50 - 1.35 mg/dL   Calcium 8.4 8.4 - 20.4 mg/dL   Total Protein 6.9 6.0 - 8.3 g/dL   Albumin 3.2 (L) 3.5 - 5.2 g/dL   AST 25 0 - 37 U/L   ALT 20 0 - 53 U/L   Alkaline Phosphatase 127 (H) 39 - 117 U/L   Total Bilirubin 1.3 (H) 0.3 - 1.2 mg/dL   GFR calc non Af Amer 26 (L) >90 mL/min   GFR calc Af Amer 30 (L) >90 mL/min  Comment: (NOTE) The eGFR has been calculated using the CKD EPI equation. This calculation has not been validated in all clinical situations. eGFR's persistently <90 mL/min signify possible Chronic Kidney Disease.    Anion gap 6 5 - 15  CBC     Status: Abnormal   Collection Time: 01/10/15  3:37 AM  Result Value Ref Range   WBC 5.0 4.0 - 10.5 K/uL   RBC 3.07 (L) 4.22 - 5.81 MIL/uL   Hemoglobin 10.5 (L) 13.0 - 17.0 g/dL   HCT 32.2 (L) 39.0 - 52.0 %   MCV 104.9 (H) 78.0 - 100.0 fL   MCH 34.2 (H) 26.0 - 34.0 pg   MCHC 32.6 30.0 - 36.0 g/dL   RDW 13.7 11.5 - 15.5 %   Platelets 134 (L) 150 - 400 K/uL  Glucose, capillary     Status: None   Collection Time: 01/10/15  7:14 AM  Result Value Ref Range   Glucose-Capillary 76 70 - 99 mg/dL   Comment 1 Notify RN    Comment 2 Document in Chart   Troponin I     Status: Abnormal   Collection Time: 01/10/15  9:43 AM  Result Value Ref Range   Troponin I 0.06 (H) <0.031 ng/mL    Comment:        PERSISTENTLY INCREASED TROPONIN VALUES IN THE RANGE OF 0.04-0.49 ng/mL CAN BE SEEN IN:       -UNSTABLE ANGINA       -CONGESTIVE HEART FAILURE       -MYOCARDITIS       -CHEST TRAUMA       -ARRYHTHMIAS       -LATE PRESENTING MYOCARDIAL INFARCTION       -COPD   CLINICAL FOLLOW-UP RECOMMENDED.     Dg Abd  Acute W/chest  01/09/2015   CLINICAL DATA:  Shortness of breath. Generalize weakness, palpitations, and abdominal swelling for several days.  EXAM: DG ABDOMEN ACUTE W/ 1V CHEST  COMPARISON:  Chest radiographs 12/13/2013. CT abdomen and pelvis 10/05/2013.  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 Cardiac silhouette remains enlarged. There is persistent opacity in the right lung base obscuring the costophrenic angle, likely reflecting a combination of pleural effusion atelectasis as previously described. Right lung base aeration appears slightly improved in the interim. There is progressive opacity in the left lung base with blunting of the costophrenic angle suggestive of enlarging, small to moderate left pleural effusion with left basilar atelectasis versus consolidation. No pneumothorax is identified. There is mild S-shaped thoracolumbar scoliosis.  There is no evidence of intraperitoneal free air. Right upper quadrant abdominal surgical clips are noted. There is a moderate amount of colonic stool. Gas is present in colon with a small amount of small bowel gas present as well. There is a single minimally prominent air-fluid level in the right upper quadrant without other evidence of bowel dilatation or obstruction. A small amount of and stool are present in the rectum.  IMPRESSION: 1. Right pleural effusion with mildly improved aeration of the right lung base. 2. Small to moderate left pleural effusion, increased from prior, with left basilar atelectasis versus consolidation. 3. Moderate amount of colonic stool. No evidence of bowel obstruction.   Electronically Signed   By: Logan Bores   On: 01/09/2015 19:55    ROS  See HPI Eyes: Negative Ears:Negative for hearing loss, tinnitus Cardiovascular: Negative for chest pain, near-syncope,  and syncope,edema, claudication, cyanosis,.  Respiratory:   Negative for cough, hemoptysis, , sputum production  and wheezing.   Endocrine: Negative for cold  intolerance and heat intolerance.  Hematologic/Lymphatic: Negative for adenopathy and bleeding problem. Does not bruise/bleed easily.  Musculoskeletal: Negative.   Gastrointestinal:positive for abdominal swelling, discomfort, nausea  Genitourinary: Negative for bladder incontinence, dysuria, flank pain, frequency, hematuria, hesitancy, nocturia and urgency.  Neurological: Negative.  Allergic/Immunologic: Negative for environmental allergies.  Blood pressure 136/52, pulse 85, temperature 97.4 F (36.3 C), temperature source Oral, resp. rate 18, height $RemoveBe'6\' 1"'SWoNSLYyu$  (1.854 m), weight 222 lb 3.2 oz (100.789 kg), SpO2 96 %. Physical Exam PHYSICAL EXAM: Well-nournished, in no acute distress. Neck: No JVD, HJR, Bruit, or thyroid enlargement Lungs: Decreased breath sounds bases bilaterally Cardiovascular: irreg irreg, PMI not displaced, 2/6 systolic murmur apex, no gallops, bruit, thrill, or heave. Abdomen:Distended. BS normal. Soft without organomegaly, masses, lesions or tenderness. Extremities: plus 2 edema. with cyanosis, clubbing . Good distal pulses bilateral SKin: Warm, no lesions or rashes  Musculoskeletal: No deformities Neuro: no focal signs  EKG: Atrial fibrillation with controlled rate  2Decho: 07/2013: Study Conclusions  - Left ventricle: The cavity size was normal. Wall thickness   was increased in a pattern of mild LVH. Systolic function   was low normal to mildly reduced. The estimated ejection   fraction was in the range of 50% to 55%. Images were   inadequate for LV wall motion assessment. Indeterminant   diastolic function with atrial fibrillation. - Aortic valve: There was no stenosis. Mild regurgitation. - Mitral valve: Mildly calcified annulus. Trivial   regurgitation. - Left atrium: The atrium was moderately dilated. - Right ventricle: Poorly visualized. The cavity size was   normal. Systolic function was normal. - Right atrium: The atrium was mildly dilated. -  Pulmonary arteries: No complete TR doppler jet so unable   to estimate PA systolic pressure. - Inferior vena cava: The vessel was normal in size; the   respirophasic diameter changes were in the normal range (=   50%); findings are consistent with normal central venous   pressure. Impressions:  - The patient was in atrial fibrillation. Technically   difficult study, would consider repeating with use of   Definity echo contrast. Normal LV size with mild LV   hypertrophy. EF 50-55% estimated but difficult to see   endocardium. Normal RV size and systolic function.  Assessment/Plan: Acute on chronic CHF EF 50-55% 2014. Repeat echo pending. Continue diuresis on IV Lasix $Remove'40mg'[id number]$  BID  Chronic atrial fibrillation: rate controlled on metoprolol, not a coumadin candidate because of hemorrhagic conversion of an acute CVA and chronic epistaxis.  CAD S/P CABG 2002- no recent studies or chest pain.   Recurrent pleural effusions S/P multiple thoracentesis and batch drainage and Pleurx catheter placement 10/2013 by Dr. Cory Roughen well since then, but effusions on CT-see above.  Chronic renal insufficiency  DM Type 1  Hx CVA   Ermalinda Barrios 01/10/2015, 10:36 AM   Patient seen and discussed with PA Bonnell Public, I agree with her documentation above. 74 yo male history of recurring right pleural effusion, CVA, CKD, DM2, afib not on anticoag due to hx hemorrhagic CVA and nose bleeds, HL, HTN, CAD with prior CABG Jan 2002, admitted with leg swelling, palpitations, and SOB. He reports decrease in lasix by nephrologist approx 4 months ago. His regular cardiologist is Dr Karilyn Cota at [facility].    07/2013 echo: LVEF 50-55%, indeterimant diastolic function, mild AI BNP 371, K 4.4, Cr 2.49 (baseline 2-2.3),GFR 24, Hgb 10.9, WBC 6, Plt 148, TSH 1.55, trop 0.06 and  flat CXR with bilateral effusions EKG Afib, RAD, anteroseptal Qwaves, incomplete RBBB  Patient presents with clinical signs of heart failure, he is  currently on lasix IV $Remove'40mg'rgHNjXv$  bid, will follow his I/Os and renal function. Afib is rate controlled with metoprolol, he has not been on anticoag due to history of hemorrhagic CVA and nosebleeds, defer future decisions on anticoag to his primary cardiologist, will not start here. Likely acute on chronic diastolic HF due to recent lasix decrease, we will f/u up his echo to see if any new dysfunction is present.   Zandra Abts MD

## 2015-01-11 LAB — GLUCOSE, CAPILLARY
GLUCOSE-CAPILLARY: 94 mg/dL (ref 70–99)
Glucose-Capillary: 197 mg/dL — ABNORMAL HIGH (ref 70–99)

## 2015-01-11 LAB — CBC
HEMATOCRIT: 31.5 % — AB (ref 39.0–52.0)
Hemoglobin: 10.4 g/dL — ABNORMAL LOW (ref 13.0–17.0)
MCH: 34.7 pg — AB (ref 26.0–34.0)
MCHC: 33 g/dL (ref 30.0–36.0)
MCV: 105 fL — ABNORMAL HIGH (ref 78.0–100.0)
Platelets: 145 10*3/uL — ABNORMAL LOW (ref 150–400)
RBC: 3 MIL/uL — AB (ref 4.22–5.81)
RDW: 13.7 % (ref 11.5–15.5)
WBC: 5.7 10*3/uL (ref 4.0–10.5)

## 2015-01-11 LAB — CREATININE, URINE, 24 HOUR
Collection Interval-UCRE24: 24 hours
Creatinine, 24H Ur: 778 mg/d — ABNORMAL LOW (ref 800–2000)
Creatinine, Urine: 31.12 mg/dL
Urine Total Volume-UCRE24: 2500 mL

## 2015-01-11 LAB — IMMUNOFIXATION ELECTROPHORESIS
IgA: 337 mg/dL (ref 61–437)
IgG (Immunoglobin G), Serum: 2122 mg/dL — ABNORMAL HIGH (ref 700–1600)
IgM, Serum: 146 mg/dL — ABNORMAL HIGH (ref 15–143)
Total Protein ELP: 7.3 g/dL (ref 6.0–8.5)

## 2015-01-11 LAB — BASIC METABOLIC PANEL
ANION GAP: 7 (ref 5–15)
BUN: 52 mg/dL — ABNORMAL HIGH (ref 6–23)
CALCIUM: 8.4 mg/dL (ref 8.4–10.5)
CO2: 30 mmol/L (ref 19–32)
Chloride: 103 mmol/L (ref 96–112)
Creatinine, Ser: 2.38 mg/dL — ABNORMAL HIGH (ref 0.50–1.35)
GFR calc Af Amer: 29 mL/min — ABNORMAL LOW (ref >90)
GFR, EST NON AFRICAN AMERICAN: 25 mL/min — AB (ref >90)
Glucose, Bld: 99 mg/dL (ref 70–99)
POTASSIUM: 3.5 mmol/L (ref 3.5–5.1)
SODIUM: 140 mmol/L (ref 135–145)

## 2015-01-11 MED ORDER — FUROSEMIDE 40 MG PO TABS: 40.0000 mg | ORAL_TABLET | Freq: Every day | ORAL | Status: DC | PRN

## 2015-01-11 MED ORDER — TORSEMIDE 20 MG PO TABS
20.0000 mg | ORAL_TABLET | Freq: Two times a day (BID) | ORAL | Status: DC
  Administered 2015-01-11: 20 mg via ORAL
  Filled 2015-01-11: qty 1

## 2015-01-11 NOTE — Progress Notes (Signed)
Subjective: Interval History: has complaints of abdominal distention, belching, and constipation. Patient presently denies any difficulty breathing. His palpitation also has improved.  Objective: Vital signs in last 24 hours: Temp:  [97.4 F (36.3 C)-98.5 F (36.9 C)] 98.5 F (36.9 C) (04/20 2052) Pulse Rate:  [72-87] 72 (04/21 0629) Resp:  [16-18] 18 (04/21 0629) BP: (111-142)/(52-79) 111/54 mmHg (04/21 0629) SpO2:  [95 %-96 %] 95 % (04/21 0629) Weight:  [96.525 kg (212 lb 12.8 oz)-96.888 kg (213 lb 9.6 oz)] 96.525 kg (212 lb 12.8 oz) (04/21 0629) Weight change: 0.272 kg (9.6 oz)  Intake/Output from previous day: 04/20 0701 - 04/21 0700 In: 360 [P.O.:360] Out: 2025 [Urine:2025] Intake/Output this shift:    General appearance: alert, cooperative and no distress Resp: diminished breath sounds posterior - bilateral and dullness to percussion posterior - bilateral Cardio: regular rate and rhythm GI: soft, non-tender; bowel sounds normal; no masses,  no organomegaly Extremities: edema Trace 1+ edema  Lab Results:  Recent Labs  01/10/15 0337 01/11/15 0601  WBC 5.0 5.7  HGB 10.5* 10.4*  HCT 32.2* 31.5*  PLT 134* 145*   BMET:  Recent Labs  01/10/15 0337 01/11/15 0601  NA 140 140  K 3.5 3.5  CL 105 103  CO2 29 30  GLUCOSE 115* 99  BUN 48* 52*  CREATININE 2.33* 2.38*  CALCIUM 8.4 8.4   No results for input(s): PTH in the last 72 hours. Iron Studies: No results for input(s): IRON, TIBC, TRANSFERRIN, FERRITIN in the last 72 hours.  Studies/Results: Dg Abd Acute W/chest  01/09/2015   CLINICAL DATA:  Shortness of breath. Generalize weakness, palpitations, and abdominal swelling for several days.  EXAM: DG ABDOMEN ACUTE W/ 1V CHEST  COMPARISON:  Chest radiographs 12/13/2013. CT abdomen and pelvis 10/05/2013.  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 Cardiac silhouette remains enlarged. There is persistent opacity in the right lung base obscuring the  costophrenic angle, likely reflecting a combination of pleural effusion atelectasis as previously described. Right lung base aeration appears slightly improved in the interim. There is progressive opacity in the left lung base with blunting of the costophrenic angle suggestive of enlarging, small to moderate left pleural effusion with left basilar atelectasis versus consolidation. No pneumothorax is identified. There is mild S-shaped thoracolumbar scoliosis.  There is no evidence of intraperitoneal free air. Right upper quadrant abdominal surgical clips are noted. There is a moderate amount of colonic stool. Gas is present in colon with a small amount of small bowel gas present as well. There is a single minimally prominent air-fluid level in the right upper quadrant without other evidence of bowel dilatation or obstruction. A small amount of and stool are present in the rectum.  IMPRESSION: 1. Right pleural effusion with mildly improved aeration of the right lung base. 2. Small to moderate left pleural effusion, increased from prior, with left basilar atelectasis versus consolidation. 3. Moderate amount of colonic stool. No evidence of bowel obstruction.   Electronically Signed   By: Logan Bores   On: 01/09/2015 19:55    I have reviewed the patient's current medications.  Assessment/Plan: Problem #1 renal failure: Chronic. Patient with early stage IIIB chronic renal failure thought to be secondary to diabetes/hypertension/cardiorenal. Presently his BUN and creatinine more or less at his baseline. Patient does not have any uremic signs and symptoms. Problem #2 history of arterial fibrillation: His heart rate is controlled Problem #3 history of CHF: Presently patient states that he is feeling better. He had  some exertional dyspnea at home which doesn't seem to be very significant. He has mild increase LEG edema. Patient on diuretics and he has about 2 L of urine output seems to be reasonable. Problem #4  hypertension: His blood pressure is reasonably controlled Problem #5 anemia: His hemoglobin and hematocrit stable Problem #6 history of diabetes with complication. Problem #7 bilateral pleural effusion Problem #8 history of CVA Plan: We'll continue his present management. We'll check his basic metabolic panel, phosphorus in the morning.   LOS: 2 days   Aidan Moten S 01/11/2015,7:36 AM

## 2015-01-11 NOTE — Consult Note (Signed)
   [facility] Inpatient Consult   01/11/2015  ABED SCHAR 09-Nov-1940 [id number]    Referral received from inpatient [facility] for [place] Management services. Patient had been active in the past with [facility] but had been discharged for non-adherence last year. Called into patient's room and spoke with patient's wife, Corky Sing, per patient's request. Discussed [facility] Care Management services with Mrs Sheehan and she was agreeable. States she feels like they can benefit from additional support from [facility]. Patient will also have home health thru [place] per inpatient RNCM. Verbal consent received from patient's wife for [facility] Care Management to follow. Explained that Mr Muzquiz will receive post hospital discharge calls and will be evaluated for monthly home visits. Explained that these services will not interfere or replace services provided by home health. Inpatient RNCM aware [facility] to follow. Mrs Karwowski states she still has the packet with [facility], The contact information for when patient was active in the past. Appreciative of call.  Marthenia Rolling, MSN-Ed, RN,BSN [facility] Liaison [phone]

## 2015-01-11 NOTE — Progress Notes (Signed)
Patient discharged home today.  Patient was given discharge instructions, prescriptions, and care notes.  Patient was also given HF packet and educated on it.  Patient verbalized understanding with no complaints or concerns voiced at this time.  Patient's heart monitor was removed and central tele was notified of patient's discharge.  IV was removed with catheter intact, no bleeding or complications.  Patient left unit in stable condition by a staff member in a wheelchair.

## 2015-01-11 NOTE — Discharge Summary (Signed)
Physician Discharge Summary  EARLY STEEL OYD:[id number] DOB: Feb 03, 1941 DOA: 01/09/2015  PCP: Gar Ponto, MD  Admit date: 01/09/2015 Discharge date: 01/11/2015  Time spent: 45 minutes  Recommendations for Outpatient Follow-up:  Patient will be discharged to home with home health PT and RN.  Patient will need to follow up with primary care provider within one week of discharge.  Patient should also follow-up with his cardiologist,  Dr. Karilyn Cota, within 1 -2 weeks of discharge. Patient should continue medications as prescribed.  Patient should follow a Heart healthy/carb modified diet with 1570mL fluid restriction per day.  Discharge Diagnoses:  Acute systolic congestive heart failure Chronic atrial fibrillation Chronic kidney disease, stage III Diabetes mellitus, type II Hypertension Recurrent pleural effusions  Discharge Condition: Stable   Diet recommendation: Heart healthy/carb modified diet with 1536mL fluid restriction per day.  Filed Weights   01/09/15 2305 01/10/15 1917 01/11/15 0629  Weight: 100.789 kg (222 lb 3.2 oz) 96.888 kg (213 lb 9.6 oz) 96.525 kg (212 lb 12.8 oz)    History of present illness:  on 01/09/2015 by Dr. Hurshel Party This is a 74 year old man who has a history of recurrent pleural effusions and has had thoracentesis several times, now presents with palpitations which are irregular along with leg swelling and degree of dyspnea. He denies any chest pain. He does describe some abdominal swelling. He has discontinued taking Lasix apparently a few months ago. He has a history of atrial fibrillation but has not been considered a candidate for chronic anticoagulations because of history of hemorrhagic conversion of an acute CVA along with history of chronic epistaxis. He is now being admitted for further management of his symptoms. Primarily it is felt that he has congestive heart failure.  Hospital Course:  Acute systolic congestive heart failure -Patient  feels improved from admission. -Echocardiogram: EF 45-50% -BNP 371 -Patient to continue his home dosage of Lasix -During hospitalization, patient did have good urine output over 2 L -Spoke with Dr. Harl Bowie, echo basically unchanged, pt to follow with his cardiologist.  Mildly elevated troponin -Stable at 0.6.  Cardiology consulted -Echocardiogram obtained -Continue follow up with Dr. Karilyn Cota upon discharge -patient chest pain free   Atrial fibrillation, chronic  -Not anticoagulation candidate due to history of hemorrhagic CVA and nosebleeds -Currently rate controlled, continue metoprolol  Chronic kidney disease, stage 3 -Creatinine appears to be stable, continue to monitor BMP -Nephrology consulted and appreciated -Patient to continue following up with his nephrology once discharged  Diabetes mellitus, type 2 -Continue levemir, ISS, and CBG monitoring   Hypertension -Continue diuresis  Recurrent pleural effusions -Patient has had multiple thoracenteses, Plavix catheter placement in February 2015 by Dr. Roxan Hockey -CT showed small to moderate left pleural effusion increased from prior, right pleural effusion with mildly improved aeration  Constipation -Continue follow up with GI and home medciations -Patient admits to be noncompliance with meds  Procedures  Echocardiogram  Consults  Cardiology Nephrology  Discharge Exam: Filed Vitals:   01/11/15 0629  BP: 111/54  Pulse: 72  Temp:   Resp: 18   Exam  General: Well developed, well nourished  HEENT: NCAT, mucous membranes moist.   Cardiovascular: S1 S2 auscultated,2/6 SEM, Irregular  Respiratory: Mildly diminished breath sounds  Abdomen: Soft, nontender, nondistended, + bowel sounds  Extremities: warm dry without cyanosis clubbing. Trace LE B/L  Neuro: AAOx3, nonfocal  Psych: Normal affect and demeanor  Discharge Instructions      Discharge Instructions    Discharge instructions  Complete  by:  As directed   Patient will be discharged to home with home health PT and RN.  Patient will need to follow up with primary care provider within one week of discharge.  Patient should also follow-up with his cardiologist,  Dr. Karilyn Cota, within 1 -2 weeks of discharge. Patient should continue medications as prescribed.  Patient should follow a Heart healthy/carb modified diet with 1565mL fluid restriction per day.            Medication List    TAKE these medications        AMITIZA 24 MCG capsule  Generic drug:  lubiprostone  Take 24 mcg by mouth daily with breakfast.     aspirin EC 81 MG tablet  Take 81 mg by mouth daily.     atorvastatin 40 MG tablet  Commonly known as:  LIPITOR  Take 40 mg by mouth daily at 6 PM.     furosemide 40 MG tablet  Commonly known as:  LASIX  Take 1 tablet (40 mg total) by mouth daily as needed for fluid.     Insulin Detemir 100 UNIT/ML Pen  Commonly known as:  LEVEMIR FLEXPEN  Inject 20 Units into the skin daily.     lactobacillus acidophilus Tabs tablet  Take 1 tablet by mouth daily.     LORazepam 0.5 MG tablet  Commonly known as:  ATIVAN  Take 0.5 mg by mouth 2 (two) times daily as needed for anxiety.     metoprolol 50 MG tablet  Commonly known as:  LOPRESSOR  Take 50 mg by mouth 2 (two) times daily.     ondansetron 4 MG tablet  Commonly known as:  ZOFRAN  Take 1 tablet (4 mg total) by mouth every 8 (eight) hours as needed for nausea or vomiting.     pantoprazole 40 MG tablet  Commonly known as:  PROTONIX  Take 1 tablet (40 mg total) by mouth 2 (two) times daily before a meal.     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     traMADol 50 MG tablet  Commonly known as:  ULTRAM  Take 50 mg by mouth every 12 (twelve) hours as needed for severe pain.     Vitamin D-3 5000 UNITS Tabs  Take 5,000 Units  by mouth daily.       Allergies  Allergen Reactions  . Ciprofloxacin Other (See Comments)    Sores all on legs.   . Finasteride     Affected  Patients testicles  . Lisinopril Other (See Comments)    Kidney damage    . Metoclopramide     Jerking all over  . Protonix [Pantoprazole Sodium]     abd pain  . Xanax [Alprazolam] Other (See Comments)    Not in right state of mind.    Follow-up Information    Follow up with [facility].   Contact information:   [address] [place] [address] [phone]       Follow up with Gar Ponto, MD. Schedule an appointment as soon as possible for a visit in 1 week.   Specialty:  Family Medicine   Why:  Hospital follow up   Contact information:   [place] [address] [phone]       Call [facility], MD.  Specialty:  Nephrology   Why:  Hospital follow up, repeat CBC and BMP   Contact information:   [address] [place] [address] [phone]        The results of significant diagnostics from this hospitalization (including imaging, microbiology, ancillary and laboratory) are listed below for reference.    Significant Diagnostic Studies: Dg Abd Acute W/chest  01/09/2015   CLINICAL DATA:  Shortness of breath. Generalize weakness, palpitations, and abdominal swelling for several days.  EXAM: DG ABDOMEN ACUTE W/ 1V CHEST  COMPARISON:  Chest radiographs 12/13/2013. CT abdomen and pelvis 10/05/2013.  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 Cardiac silhouette remains enlarged. There is persistent opacity in the right lung base obscuring the costophrenic angle, likely reflecting a combination of pleural effusion atelectasis as previously described. Right lung base aeration appears slightly improved in the interim. There is progressive opacity in the left lung base with blunting of the costophrenic angle suggestive of enlarging, small to moderate left pleural  effusion with left basilar atelectasis versus consolidation. No pneumothorax is identified. There is mild S-shaped thoracolumbar scoliosis.  There is no evidence of intraperitoneal free air. Right upper quadrant abdominal surgical clips are noted. There is a moderate amount of colonic stool. Gas is present in colon with a small amount of small bowel gas present as well. There is a single minimally prominent air-fluid level in the right upper quadrant without other evidence of bowel dilatation or obstruction. A small amount of and stool are present in the rectum.  IMPRESSION: 1. Right pleural effusion with mildly improved aeration of the right lung base. 2. Small to moderate left pleural effusion, increased from prior, with left basilar atelectasis versus consolidation. 3. Moderate amount of colonic stool. No evidence of bowel obstruction.   Electronically Signed   By: Logan Bores   On: 01/09/2015 19:55    Microbiology: No results found for this or any previous visit (from the past 240 hour(s)).   Labs: Basic Metabolic Panel:  Recent Labs Lab 01/09/15 1850 01/10/15 0337 01/11/15 0601  NA 138 140 140  K 4.4 3.5 3.5  CL 101 105 103  CO2 28 29 30   GLUCOSE 231* 115* 99  BUN 48* 48* 52*  CREATININE 2.49* 2.33* 2.38*  CALCIUM 8.5 8.4 8.4   Liver Function Tests:  Recent Labs Lab 01/09/15 1850 01/10/15 0337  AST 28 25  ALT 20 20  ALKPHOS 141* 127*  BILITOT 1.2 1.3*  PROT 7.6 6.9  ALBUMIN 3.4* 3.2*   No results for input(s): LIPASE, AMYLASE in the last 168 hours. No results for input(s): AMMONIA in the last 168 hours. CBC:  Recent Labs Lab 01/09/15 1850 01/10/15 0337 01/11/15 0601  WBC 6.0 5.0 5.7  HGB 10.9* 10.5* 10.4*  HCT 33.5* 32.2* 31.5*  MCV 105.7* 104.9* 105.0*  PLT 148* 134* 145*   Cardiac Enzymes:  Recent Labs Lab 01/09/15 1850 01/09/15 2205 01/10/15 0337 01/10/15 0943  TROPONINI 0.06* 0.06* 0.06* 0.06*   BNP: BNP (last 3 results)  Recent Labs   01/09/15 1850  BNP 371.0*    ProBNP (last 3 results) No results for input(s): PROBNP in the last 8760 hours.  CBG:  Recent Labs Lab 01/10/15 0714 01/10/15 1142 01/10/15 1650 01/10/15 2055 01/11/15 0718  GLUCAP 76 186* 209* 159* 94       Signed:  Marra Fraga  [facility] 01/11/2015, 10:31 AM

## 2015-01-11 NOTE — Discharge Instructions (Signed)

## 2015-01-12 ENCOUNTER — Other Ambulatory Visit: Payer: Self-pay | Admitting: *Deleted

## 2015-01-12 LAB — UIFE/LIGHT CHAINS/TP QN, 24-HR UR
Albumin, U: DETECTED
Alpha 1, Urine: DETECTED — AB
Alpha 2, Urine: DETECTED — AB
Beta, Urine: DETECTED — AB
Gamma Globulin, Urine: DETECTED — AB
Time: 24 hours
Total Protein, Urine-Ur/day: 670 mg/d — ABNORMAL HIGH (ref <150)
Total Protein, Urine: 26 mg/dL — ABNORMAL HIGH (ref 5–25)
Volume, Urine: 2575 mL

## 2015-01-12 NOTE — Patient Outreach (Signed)
01/12/15- Referral received from  [facility], pt discharged from [place] on 01/11/15,  Telephone call to pt for transition of care week 1, pt gave permission to speak with wife (HIPAA verified),  Spoke with wife Iris who is agreeable for weekly transition of care calls and is aware home health will be seeing pt starting tomorrow, Weight today 214 pounds,  CBG 214.   Wife requests initial home visit be completed as she feels they could use "all the help" they can get. Faxed transition of care encounter to primary MD- Dr. Quillian Quince.   PLAN Initial home visit set up for Tuesday 01/16/15.  [facility] CM Care Plan Problem One        Patient Outreach Telephone from 01/12/2015 in [place] Problem One  pt has not reviewed discharge instructions nor made appointments with cardiologist and nephrologist   Care Plan for Problem One  Active   [facility] Long Term Goal Start Date  01/12/15   Interventions for Problem One Long Term Goal  Reviewed discharge instructions with patient's wife and reviewed all upcoming MD appointments.   [facility] CM Short Term Goal #1 (0-30 days)  pt or wife will make cardiologist and nephrologist appointments with one week   THN CM Short Term Goal #1 Start Date  01/12/15   Interventions for Short Term Goal #1  RN CM reviewed from discharge summary pt is to make appointment with cardiologist Dr. Remer Macho to be seen within 2 weeks and nephrologist [name] for repeat blood work. [wife verbalizes understanding that she will complete this task.]      Jacqlyn Larsen [facility], [place] Coordinator [phone]

## 2015-01-15 LAB — UIFE/LIGHT CHAINS/TP QN, 24-HR UR
Albumin, U: DETECTED
Alpha 1, Urine: DETECTED — AB
Alpha 2, Urine: DETECTED — AB
Beta, Urine: DETECTED — AB
Gamma Globulin, Urine: DETECTED — AB
Time: 24 hours
Total Protein, Urine-Ur/day: 575 mg/d — ABNORMAL HIGH (ref <150)
Total Protein, Urine: 23 mg/dL (ref 5–25)
Volume, Urine: 2500 mL

## 2015-01-16 ENCOUNTER — Other Ambulatory Visit: Payer: Self-pay | Admitting: *Deleted

## 2015-01-16 ENCOUNTER — Encounter: Payer: Self-pay | Admitting: *Deleted

## 2015-01-16 VITALS — BP 122/64 | HR 76 | Resp 16 | Ht 73.0 in | Wt 213.8 lb

## 2015-01-16 DIAGNOSIS — I509 Heart failure, unspecified: Secondary | ICD-10-CM

## 2015-01-16 NOTE — Patient Outreach (Signed)
[facility]) Care Management   01/16/2015  RAFIQ BUCKLIN 08/04/1941 [id number]  CAVEN PERINE is an 74 y.o. male  Subjective: Initial home visit with pt,  HIPAA verfied,  Wife and pt sitting at kitchen table,  Pt reports he cannot afford eye drops, still has problems paying copays at MD office, this is why pt may not be seeing cardiologist unless pt can find the money- (per wife is $45.00)   Home health to draw blood work for nephrologist so pt will not have to go to Dr. Florentina Addison office,  Home health RN and PT working with pt.  Wife and pt refuses assistance from [employer] stating "we got the minimum of food stamps and told them to keep it, not worth it,  They've all tried to help [place] before and we have already applied for everything"  Pt reports he has ongoing medical bills that have accumulated over the years and pt is making monthly payments.  Objective:   Filed Vitals:   01/16/15 1156  BP: 122/64  Pulse: 76  Resp: 16  Weight 213.8 pounds CBG today 170 7 day average 228 14 day average 172 30 day average 164  ROS  Physical Exam  Constitutional: He is oriented to person, place, and time. He appears well-developed and well-nourished.  HENT:  Head: Normocephalic.  Neck: Normal range of motion.  Cardiovascular: Normal rate.   Irregular rhythym  Respiratory: Breath sounds normal.  Dyspnea with exertion  GI: Soft. Bowel sounds are normal.  Musculoskeletal: He exhibits edema.  1+ edema lower extremities bil Mottling lower extremities bil- chronic  Neurological: He is alert and oriented to person, place, and time.  Skin: Skin is warm and dry.  Psychiatric: He has a normal mood and affect. His behavior is normal. Judgment and thought content normal.    Current Medications:   Current Outpatient Prescriptions  Medication Sig Dispense Refill  . AMITIZA 24 MCG capsule Take 24 mcg by mouth daily with breakfast.     . aspirin EC 81 MG tablet Take 81 mg by mouth  daily.    Marland Kitchen atorvastatin (LIPITOR) 40 MG tablet Take 40 mg by mouth daily at 6 PM.     . Cholecalciferol (VITAMIN D-3) 5000 UNITS TABS Take 5,000 Units by mouth daily.     . furosemide (LASIX) 40 MG tablet Take 1 tablet (40 mg total) by mouth daily as needed for fluid. 30 tablet 1  . Insulin Detemir (LEVEMIR FLEXPEN) 100 UNIT/ML Pen Inject 20 Units into the skin daily. (Patient taking differently: Inject 45 Units into the skin daily. ) 2 pen 2  . lactobacillus acidophilus (BACID) TABS tablet Take 1 tablet by mouth daily.    Marland Kitchen LORazepam (ATIVAN) 0.5 MG tablet Take 0.5 mg by mouth 2 (two) times daily as needed for anxiety.    . metoprolol (LOPRESSOR) 50 MG tablet Take 50 mg by mouth 2 (two) times daily.    . ondansetron (ZOFRAN) 4 MG tablet Take 1 tablet (4 mg total) by mouth every 8 (eight) hours as needed for nausea or vomiting. 30 tablet 1  . 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    . pantoprazole (PROTONIX) 40 MG tablet Take 1 tablet (40 mg total) by mouth 2 (two) times daily before a meal. (Patient not taking: Reported on 01/10/2015) 60 tablet 3  . traMADol (ULTRAM) 50 MG  tablet Take 50 mg by mouth every 12 (twelve) hours as needed for severe pain.     No current facility-administered medications for this visit.    Functional Status:   In your present state of health, do you have any difficulty performing the following activities: 01/16/2015 01/09/2015  Hearing? N N  Vision? N N  Difficulty concentrating or making decisions? N N  Walking or climbing stairs? Y Y  Dressing or bathing? N N  Doing errands, shopping? [name]  Preparing Food and eating ? N -  Using the Toilet? N -  In the past six months, have you accidently leaked urine? N -  Do you have problems with loss of bowel control? N -  Managing your Medications? Y -  Managing your Finances? N -  Housekeeping or managing  your Housekeeping? Y -    Fall/Depression Screening:    PHQ 2/9 Scores 01/16/2015  PHQ - 2 Score 0    Assessment:  Pt not taking pantoprazole and does not have on hand, reported to Dr. Quillian Quince and ask is pt to take, faxed initial home visit and barrier letter to primary MD Dr. Quillian Quince. Sent order to [facility] pharmacist for assistance with prednisolone eye drops as pt cannot afford. RN CM reviewed EMMI handouts with pt, reviewed CHF zones and action plan, pt needs reinforcement, review with this as well as low salt diet, RN CM ask pt to start recording weight in [facility] calendar.  Plan: follow up with home visit 02/06/15 Assess blood sugar, weight log Follow up eye drops with [facility] pharmacist  [place]        Patient Outreach from 01/16/2015 in [place] Problem One  pt has not reviewed discharge instructions nor made appointments with cardiologist and nephrologist   Care Plan for Problem One  Active   Interventions for Problem One Long Term Goal  Reviewed discharge instructions with patient's wife and reviewed all upcoming MD appointments.   THN Long Term Goal (31-90 days)  pt will consistently make and attend MD and other appointments without prompting within 90 days   THN Long Term Goal Start Date  01/12/15   [facility]N CM Short Term Goal #1 (0-30 days)  pt or wife will make cardiologist and nephrologist appointments with one week   THN CM Short Term Goal #1 Start Date  01/12/15   Care Plan Problem Two  knowledge deficit related to heart failure   Care Plan for Problem Two  Active   THN Long Term Goal (31-90) days  pt will have no readmissions within 90 days   THN Long Term Goal Start Date  01/16/15   THN CM Short Term Goal #1 (0-30 days)  pt will verbalize CHF zones/ action plan within 30 days   [facility] CM Short Term Goal #1 Start Date  01/16/15      Jacqlyn Larsen [facility], [place] Coordinator [phone]

## 2015-01-22 ENCOUNTER — Other Ambulatory Visit: Payer: Self-pay | Admitting: *Deleted

## 2015-01-22 NOTE — Patient Outreach (Signed)
01/22/15-  Telephone call for transition of care week 3, spoke with wife (pt gave permission), HIPAA verified,  Per wife, home health did bloodwork ordered by nephrologist and pt will not be going to this appointment until a later date, pt also going to wait and make cardiology appointment due to copay issue, cannot afford, pt, wife still refuse LCSW services citing " we've had several of those before with home health and [facility] and no one can help with the copay issue and we don't have $45.00 every time"  Pt continues to see primary care MD regularly.   Pt is weighing daily and recording, weight today 216 pounds and has new scales per wife, denies no more edema than usual, no dyspnea,  CBG 298 this morning after eating several cookies before bedtime.  Home health continues to see pt in home.  RN CM encouraged and praised pt, wife for their efforts of weighing daily and recording, reviewed CHF action plan once again, reminded to call early for change in health status/ symptoms- reviewed resources to call (MD, home health, [facility] care manager, EMS for chest pain, unrelieved dyspnea, syncope).   PLAN Follow up with transition of care call next week  Jacqlyn Larsen [facility], [place] Coordinator [phone]

## 2015-01-29 ENCOUNTER — Other Ambulatory Visit: Payer: Self-pay | Admitting: Pharmacist

## 2015-01-29 NOTE — Patient Outreach (Signed)
[facility]) Care Management  [place]   01/29/2015  FLORA RATZ 05-12-1941 [id number]  Subjective: Chris [name] is a 74 y.o. male who was referred to [facility] program for assistance with affording his eye drops, prednisolone acetate. The patient is a [employer] patient and prednisolone acetate is not on the formulary. However, [employer] will cover Durezol, dexamethasone, fluorometholone, and prednisolone sodium phosphate.  I spoke with the patient and his wife over the phone and they reported that he was prescribed the eye drops by his old eye doctor. They had to stop seeing this eye doctor because he was not in the [place]. They have recently seen someone in [place] but they are unsure of the name of the practice or the doctor.    Objective:   Current Medications: Current Outpatient Prescriptions  Medication Sig Dispense Refill  . AMITIZA 24 MCG capsule Take 24 mcg by mouth daily with breakfast.     . aspirin EC 81 MG tablet Take 81 mg by mouth daily.    Marland Kitchen atorvastatin (LIPITOR) 40 MG tablet Take 40 mg by mouth daily at 6 PM.     . Cholecalciferol (VITAMIN D-3) 5000 UNITS TABS Take 5,000 Units by mouth daily.     . furosemide (LASIX) 40 MG tablet Take 1 tablet (40 mg total) by mouth daily as needed for fluid. 30 tablet 1  . Insulin Detemir (LEVEMIR FLEXPEN) 100 UNIT/ML Pen Inject 20 Units into the skin daily. (Patient taking differently: Inject 45 Units into the skin daily. ) 2 pen 2  . lactobacillus acidophilus (BACID) TABS tablet Take 1 tablet by mouth daily.    Marland Kitchen LORazepam (ATIVAN) 0.5 MG tablet Take 0.5 mg by mouth 2 (two) times daily as needed for anxiety.    . metoprolol (LOPRESSOR) 50 MG tablet Take 50 mg by mouth 2 (two) times daily.    . ondansetron (ZOFRAN) 4 MG tablet Take 1 tablet (4 mg total) by mouth every 8 (eight) hours as needed for nausea or vomiting. 30 tablet 1  . pantoprazole (PROTONIX) 40 MG tablet Take 1 tablet (40 mg total) by  mouth 2 (two) times daily before a meal. (Patient not taking: Reported on 01/10/2015) 60 tablet 3  . 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    . traMADol (ULTRAM) 50 MG tablet Take 50 mg by mouth every 12 (twelve) hours as needed for severe pain.     No current facility-administered medications for this visit.    Functional Status: In your present state of health, do you have any difficulty performing the following activities: 01/16/2015 01/09/2015  Hearing? N N  Vision? N N  Difficulty concentrating or making decisions? N N  Walking or climbing stairs? Y Y  Dressing or bathing? N N  Doing errands, shopping? [name]  Preparing Food and eating ? N -  Using the Toilet? N -  In the past six months, have you accidently leaked urine? N -  Do you have problems with loss of bowel control? N -  Managing your Medications? Y -  Managing your Finances? N -  Housekeeping or managing your Housekeeping? Y -    Fall/Depression Screening: PHQ 2/9 Scores 01/16/2015  PHQ - 2 Score 0    Assessment: 1. Medication assistance: recommendations for switching to eye drops covered by the [employer] but do not have a  provider to communicate these recommendations to.   Plan: 1. Medication assistance: patient and his wife will find out the name of the practice and the eye doctor that he recently saw in [place]. They will call me back once they have this information. Then I will communicate my recommendations for a formulary switch to the provider. Patient and wife provided with my contact information and verbalized understanding. Will reach out to patient again on Thursday, Feb 01, 2015 if I do not receive a return call.   Nicoletta Ba, PharmD, [facility] Resident [phone]

## 2015-01-31 ENCOUNTER — Other Ambulatory Visit: Payer: Self-pay | Admitting: *Deleted

## 2015-01-31 NOTE — Patient Outreach (Signed)
01/31/15-  Telephone call to patient's home for transition of care week 4, no answer to telephone, received unidentified voicemail.  PLAN See pt for scheduled home visit on 02/06/15

## 2015-02-01 ENCOUNTER — Other Ambulatory Visit: Payer: Self-pay | Admitting: Pharmacist

## 2015-02-01 NOTE — Patient Outreach (Signed)
[facility]) Care Management  [place]   02/01/2015  Chris [name] [birth year] [id number]  Subjective: Chris [name] is a 74 y.o. male who was referred to [place] program for assistance with affording his eye drops, prednisolone acetate. The patient is a [employer] patient and prednisolone acetate is not on the formulary. However, [employer] will cover Durezol, dexamethasone, fluorometholone, and prednisolone sodium phosphate.  I spoke with the patient and his wife over the phone and today reported that the patient need to establish care with a new eye doctor. They would like me to communicate my recommendations to Dr. Quillian Quince, who is the patient's primary care provider.    Objective:   Current Medications: Current Outpatient Prescriptions  Medication Sig Dispense Refill  . AMITIZA 24 MCG capsule Take 24 mcg by mouth daily with breakfast.     . aspirin EC 81 MG tablet Take 81 mg by mouth daily.    Marland Kitchen atorvastatin (LIPITOR) 40 MG tablet Take 40 mg by mouth daily at 6 PM.     . Cholecalciferol (VITAMIN D-3) 5000 UNITS TABS Take 5,000 Units by mouth daily.     . furosemide (LASIX) 40 MG tablet Take 1 tablet (40 mg total) by mouth daily as needed for fluid. 30 tablet 1  . Insulin Detemir (LEVEMIR FLEXPEN) 100 UNIT/ML Pen Inject 20 Units into the skin daily. (Patient taking differently: Inject 45 Units into the skin daily. ) 2 pen 2  . lactobacillus acidophilus (BACID) TABS tablet Take 1 tablet by mouth daily.    Marland Kitchen LORazepam (ATIVAN) 0.5 MG tablet Take 0.5 mg by mouth 2 (two) times daily as needed for anxiety.    . metoprolol (LOPRESSOR) 50 MG tablet Take 50 mg by mouth 2 (two) times daily.    . ondansetron (ZOFRAN) 4 MG tablet Take 1 tablet (4 mg total) by mouth every 8 (eight) hours as needed for nausea or vomiting. 30 tablet 1  . pantoprazole (PROTONIX) 40 MG tablet Take 1 tablet (40 mg total) by mouth 2 (two) times daily before a meal. (Patient not taking: Reported on  01/10/2015) 60 tablet 3  . 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    . traMADol (ULTRAM) 50 MG tablet Take 50 mg by mouth every 12 (twelve) hours as needed for severe pain.     No current facility-administered medications for this visit.    Functional Status: In your present state of health, do you have any difficulty performing the following activities: 01/16/2015 01/09/2015  Hearing? N N  Vision? N N  Difficulty concentrating or making decisions? N N  Walking or climbing stairs? Y Y  Dressing or bathing? N N  Doing errands, shopping? [name]  Preparing Food and eating ? N -  Using the Toilet? N -  In the past six months, have you accidently leaked urine? N -  Do you have problems with loss of bowel control? N -  Managing your Medications? Y -  Managing your Finances? N -  Housekeeping or managing your Housekeeping? Y -    Fall/Depression Screening: PHQ 2/9 Scores 01/16/2015  PHQ - 2 Score 0    Assessment: 1. Medication assistance: recommendations for switching to eye drops covered by the [place] but do not have a eye provider to communicate these recommendations to.   Plan: 1. Medication assistance: patient and his wife still  do not know the name of the eye doctor and today report that this is because the patient will be seeing a new eye doctor and needs to be established somewhere. They expect him to go to the eye doctor in the next few weeks to months. I will communicate my recommendations to Dr. Quillian Quince in case he would like to prescribe the eye drops.    Chris [name], PharmD, [facility] Resident [phone]

## 2015-02-06 ENCOUNTER — Other Ambulatory Visit: Payer: Self-pay | Admitting: *Deleted

## 2015-02-06 ENCOUNTER — Encounter: Payer: Self-pay | Admitting: *Deleted

## 2015-02-06 NOTE — Patient Outreach (Signed)
[place] [facility]) Care Management   02/06/2015  Chris [name] Apr 04, 1941 [id number]  Chris [name] is an 74 y.o. male  Subjective: Routine home visit with pt, HIPAA verified, wife and pt sitting at kitchen table, pt reports " I pulled a piece of dry skin off my leg, now I have a scab" pt reports weighs daily, checks CBG daily, pt states he found some eye drops that were in date and is using at present, pt denies dyspnea, reports home health nurse continues to see him.  Objective:   Filed Vitals:   02/06/15 1347  BP: 126/58  Pulse: 72  Resp: 18  Weight: 211 lb (95.709 kg)  SpO2: 98%  fasting CBG tocay 117 7 day average 237 14 day average 207 30 day average 189 ROS  Physical Exam  Constitutional: He is oriented to person, place, and time. He appears well-developed and well-nourished.  HENT:  Head: Normocephalic.  Neck: Normal range of motion.  Cardiovascular: Normal rate.   Irregular rhythym  Respiratory: Breath sounds normal.  GI: Soft. Bowel sounds are normal.  Musculoskeletal: Normal range of motion. He exhibits edema.  1+ edema lower extremities bil  Neurological: He is alert and oriented to person, place, and time.  Skin: Skin is warm and dry.  Psychiatric:  Mottled lower extremities bil Pt has dark colored scabbed over area to left lower anterior leg, no drainage, no signs/ symptoms infection.    Current Medications:   Current Outpatient Prescriptions  Medication Sig Dispense Refill  . AMITIZA 24 MCG capsule Take 24 mcg by mouth daily with breakfast.     . aspirin EC 81 MG tablet Take 81 mg by mouth daily.    Marland Kitchen atorvastatin (LIPITOR) 40 MG tablet Take 40 mg by mouth daily at 6 PM.     . Cholecalciferol (VITAMIN D-3) 5000 UNITS TABS Take 5,000 Units by mouth daily.     . furosemide (LASIX) 40 MG tablet Take 1 tablet (40 mg total) by mouth daily as needed for fluid. 30 tablet 1  . Insulin Detemir (LEVEMIR FLEXPEN) 100 UNIT/ML Pen Inject 20 Units into  the skin daily. (Patient taking differently: Inject 45 Units into the skin daily. ) 2 pen 2  . lactobacillus acidophilus (BACID) TABS tablet Take 1 tablet by mouth daily.    Marland Kitchen LORazepam (ATIVAN) 0.5 MG tablet Take 0.5 mg by mouth 2 (two) times daily as needed for anxiety.    . metoprolol (LOPRESSOR) 50 MG tablet Take 50 mg by mouth 2 (two) times daily.    . ondansetron (ZOFRAN) 4 MG tablet Take 1 tablet (4 mg total) by mouth every 8 (eight) hours as needed for nausea or vomiting. 30 tablet 1  . 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    . traMADol (ULTRAM) 50 MG tablet Take 50 mg by mouth every 12 (twelve) hours as needed for severe pain.    . pantoprazole (PROTONIX) 40 MG tablet Take 1 tablet (40 mg total) by mouth 2 (two) times daily before a meal. (Patient not taking: Reported on 02/06/2015) 60 tablet 3   No current facility-administered medications for this visit.    Functional Status:   In your present state of health, do you have any difficulty performing the following activities: 01/16/2015 01/09/2015  Hearing? N N  Vision? N N  Difficulty concentrating or making decisions? N N  Walking  or climbing stairs? Y Y  Dressing or bathing? N N  Doing errands, shopping? [name]  Preparing Food and eating ? N -  Using the Toilet? N -  In the past six months, have you accidently leaked urine? N -  Do you have problems with loss of bowel control? N -  Managing your Medications? Y -  Managing your Finances? N -  Housekeeping or managing your Housekeeping? Y -    Fall/Depression Screening:    PHQ 2/9 Scores 01/16/2015  PHQ - 2 Score 0    Assessment: RN CM faxed note to Dr. Quillian Quince reporting pt has small scabbed area to left lower leg with no signs/ symptoms infection, also reported to [place], left voicemail for [employer] in office at [place] branch. RN CM reviewed EMMI  handouts with pt and wife as follows: Living with Atrial Fibrillation and Diabetes Care Check List, RN CM continues to reinforce disease processes heart failure and diabetes with attention to diet and self care, managing symptoms and reminding of resources to call early for change in health status, symptoms, RN CM reviewed signs/ symptoms of infection with pt and wife.  [facility] CM Care Plan Problem One        Patient Outreach from 02/06/2015 in [place] Problem One  pt has not reviewed discharge instructions nor made appointments with cardiologist and nephrologist   Care Plan for Problem One  Not Active   THN Long Term Goal (31-90 days)  pt will consistently make and attend MD and other appointments without prompting within 90 days [pt attending all primary MD appointments]   [facility] Long Term Goal Start Date  01/12/15   [facility] Long Term Goal Met Date  02/06/15 [pt not attending all specialists appointments due to copays]   THN CM Short Term Goal #1 (0-30 days)  pt or wife will make cardiologist and nephrologist appointments with one week   THN CM Short Term Goal #1 Start Date  01/12/15   [facility] CM Short Term Goal #1 Met Date  01/22/15   Interventions for Short Term Goal #1  RN CM encouraged pt to attend all appointments    [place] Problem Two        Patient Outreach from 02/06/2015 in [place] Problem Two  knowledge deficit related to heart failure   Care Plan for Problem Two  Active   Interventions for Problem Two Long Term Goal   Reviewed CHF zones in [facility] calendar   THN Long Term Goal (31-90) days  pt will have no readmissions within 90 days   THN Long Term Goal Start Date  01/16/15   THN CM Short Term Goal #1 (0-30 days)  pt will verbalize CHF zones/ action plan within 30 days   THN CM Short Term Goal #1 Start Date  02/15/15 [goal restarted- needs review]   Interventions for Short Term Goal #2   RN CM reiterated CHF zones/ action plan and  importance of daily weights and logging (which pt is doing)    [facility] CM Care Plan Problem Three        Patient Outreach from 02/06/2015 in [place] Problem Three  Knowledge deficit related to diabetes   Care Plan for Problem Three  Active   THN CM Short Term Goal #1 (0-30 days)  pt will verbalize diabetes care check list within 30 days  THN CM Short Term Goal #1 Start Date  02/06/15   Interventions for Short Term Goal #1  RN CM reviewed EMMI handout Diabetes Care Check List      Plan: follow up with home visit on 03/06/15 Assess CBG, weight, edema. CHF zones review.   Jacqlyn Larsen [facility], [place] Coordinator [phone]

## 2015-03-06 ENCOUNTER — Other Ambulatory Visit: Payer: Self-pay | Admitting: *Deleted

## 2015-03-06 ENCOUNTER — Encounter: Payer: Self-pay | Admitting: *Deleted

## 2015-03-06 NOTE — Patient Outreach (Signed)
[name]) Care Management   03/06/2015  Chris HAEGELE 1941/04/22 [id number]  Chris [name] is an 74 y.o. male  Subjective: Routine home visit with pt, HIPAA verfied, wife present, reports " that spot on my leg healed up and then my toenail on left foot came off"  Pt states he had tooth pulled and had problems with bleeding and says " Home health nurse helped me with that and called the doctor, it's okay now" home health RN did bloodwork this week for [name] and pt to see MD tomorrow (kidney). Pt states he eats "alot of carbohydrates at bedtime cause afraid my sugar will drop too low" pt eating a lot of prunes each day.  Objective:   Filed Vitals:   03/06/15 1230  BP: 125/62  Pulse: 74  Resp: 20  Weight: 209 lb 9.6 oz (95.074 kg)  SpO2: 98%  CBG today 327 (ate prunes) 7 day average 236 14 day average 226 30 day average 237 ROS  Physical Exam  Constitutional: He is oriented to person, place, and time. He appears well-developed and well-nourished.  HENT:  Head: Normocephalic.  Eyes: No scleral icterus.  Neck: Normal range of motion.  Cardiovascular: Normal rate.   Respiratory: Breath sounds normal.  GI: Soft. Bowel sounds are normal.  Musculoskeletal: He exhibits edema.  1+ edema right lower extremity  Neurological: He is alert and oriented to person, place, and time.  Skin: Skin is warm and dry.  Psychiatric: He has a normal mood and affect. His behavior is normal. Judgment and thought content normal.    Current Medications:   Current Outpatient Prescriptions  Medication Sig Dispense Refill  . AMITIZA 24 MCG capsule Take 24 mcg by mouth daily with breakfast.     . aspirin EC 81 MG tablet Take 81 mg by mouth daily.    Marland Kitchen atorvastatin (LIPITOR) 40 MG tablet Take 40 mg by mouth daily at 6 PM.     . Cholecalciferol (VITAMIN D-3) 5000 UNITS TABS Take 5,000 Units by mouth daily.     . furosemide (LASIX) 40 MG tablet Take 1 tablet (40 mg total) by mouth  daily as needed for fluid. 30 tablet 1  . Insulin Detemir (LEVEMIR FLEXPEN) 100 UNIT/ML Pen Inject 20 Units into the skin daily. (Patient taking differently: Inject 45 Units into the skin daily. ) 2 pen 2  . lactobacillus acidophilus (BACID) TABS tablet Take 1 tablet by mouth daily.    Marland Kitchen LORazepam (ATIVAN) 0.5 MG tablet Take 0.5 mg by mouth 2 (two) times daily as needed for anxiety.    . metoprolol (LOPRESSOR) 50 MG tablet Take 50 mg by mouth 2 (two) times daily.    . ondansetron (ZOFRAN) 4 MG tablet Take 1 tablet (4 mg total) by mouth every 8 (eight) hours as needed for nausea or vomiting. 30 tablet 1  . 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    . traMADol (ULTRAM) 50 MG tablet Take 50 mg by mouth every 12 (twelve) hours as needed for severe pain.    . pantoprazole (PROTONIX) 40 MG tablet Take 1 tablet (40 mg total) by mouth 2 (two) times daily before a meal. (Patient not taking: Reported on 02/06/2015) 60 tablet 3   No current facility-administered medications for this visit.    Functional Status:   In your present state of health, do you have  any difficulty performing the following activities: 01/16/2015 01/09/2015  Hearing? N N  Vision? N N  Difficulty concentrating or making decisions? N N  Walking or climbing stairs? Y Y  Dressing or bathing? N N  Doing errands, shopping? [name]  Preparing Food and eating ? N -  Using the Toilet? N -  In the past six months, have you accidently leaked urine? N -  Do you have problems with loss of bowel control? N -  Managing your Medications? Y -  Managing your Finances? N -  Housekeeping or managing your Housekeeping? Y -    Fall/Depression Screening:    PHQ 2/9 Scores 01/16/2015  PHQ - 2 Score 0    Assessment:  Observed feet and toenail is off on third left toe, no open areas noted, previous lesion to left lower anterior extremity is  healed.  Home health continues to work with pt.  Pt is eating more carbohydrates than he should as he has been doing this for years due to being cautious that blood sugar will drop too low, RN CM reiterated what is appropriate snack at bedtime and to choose approximately 3 carbohydrate choices per meal.  Pt has had diabetes for years and feels his way of managing is best for him. RN CM discussed discharge plan with pt and wife, that next month may be the last home visit and then will discuss option of RN health coach.  [facility] CM Care Plan Problem One        Patient Outreach from 03/06/2015 in [place] for Problem One  Not Active   THN Long Term Goal (31-90 days)  -- [pt attending all primary MD appointments]   [name] Long Term Goal Met Date  -- [pt not attending all specialists appointments due to [place] Problem Two        Patient Outreach from 03/06/2015 in [place] Problem Two  knowledge deficit related to heart failure   Care Plan for Problem Two  Active   Interventions for Problem Two Long Term Goal   Reviewed CHF zones in [facility] calendar   THN Long Term Goal (31-90) days  pt will have no readmissions within 90 days   THN Long Term Goal Start Date  01/16/15   THN CM Short Term Goal #1 (0-30 days)  pt will verbalize CHF zones/ action plan within 30 days   THN CM Short Term Goal #1 Start Date  03/06/15 [goal restarted- continue to review]   Interventions for Short Term Goal #2   RN CM reiterated CHF zones/ action plan and importance of daily weights and logging (which pt is doing)    [name] CM Care Plan Problem Three        Patient Outreach from 03/06/2015 in [place] Problem Three  Knowledge deficit related to diabetes   Care Plan for Problem Three  Active   THN CM Short Term Goal #1 (0-30 days)  pt will verbalize diabetes care check list within 30 days   THN CM Short Term Goal #1 Start Date  03/06/15  Barrie Folk restarted, pt needs review, reinforcement]   Interventions for Short Term Goal #1  RN CM reviewed importance of having bedtime snack with carbohydrate/ protein, reviewed normal parameters for fasting CBG and importance of being mindful of carbohydate intake at each meal.      Plan: follow up  with home visit on 04/03/15 Assess need for transfer to RN health coach  Jacqlyn Larsen [facility], [place] Coordinator [phone]

## 2015-03-13 IMAGING — CR DG CHEST 1V
1 series · 1 of 1 positions shown · non-contrast
Comparison: 02/10/2013

CLINICAL DATA: Shortness of breath.

EXAM:
CHEST - 1 VIEW

[ap]
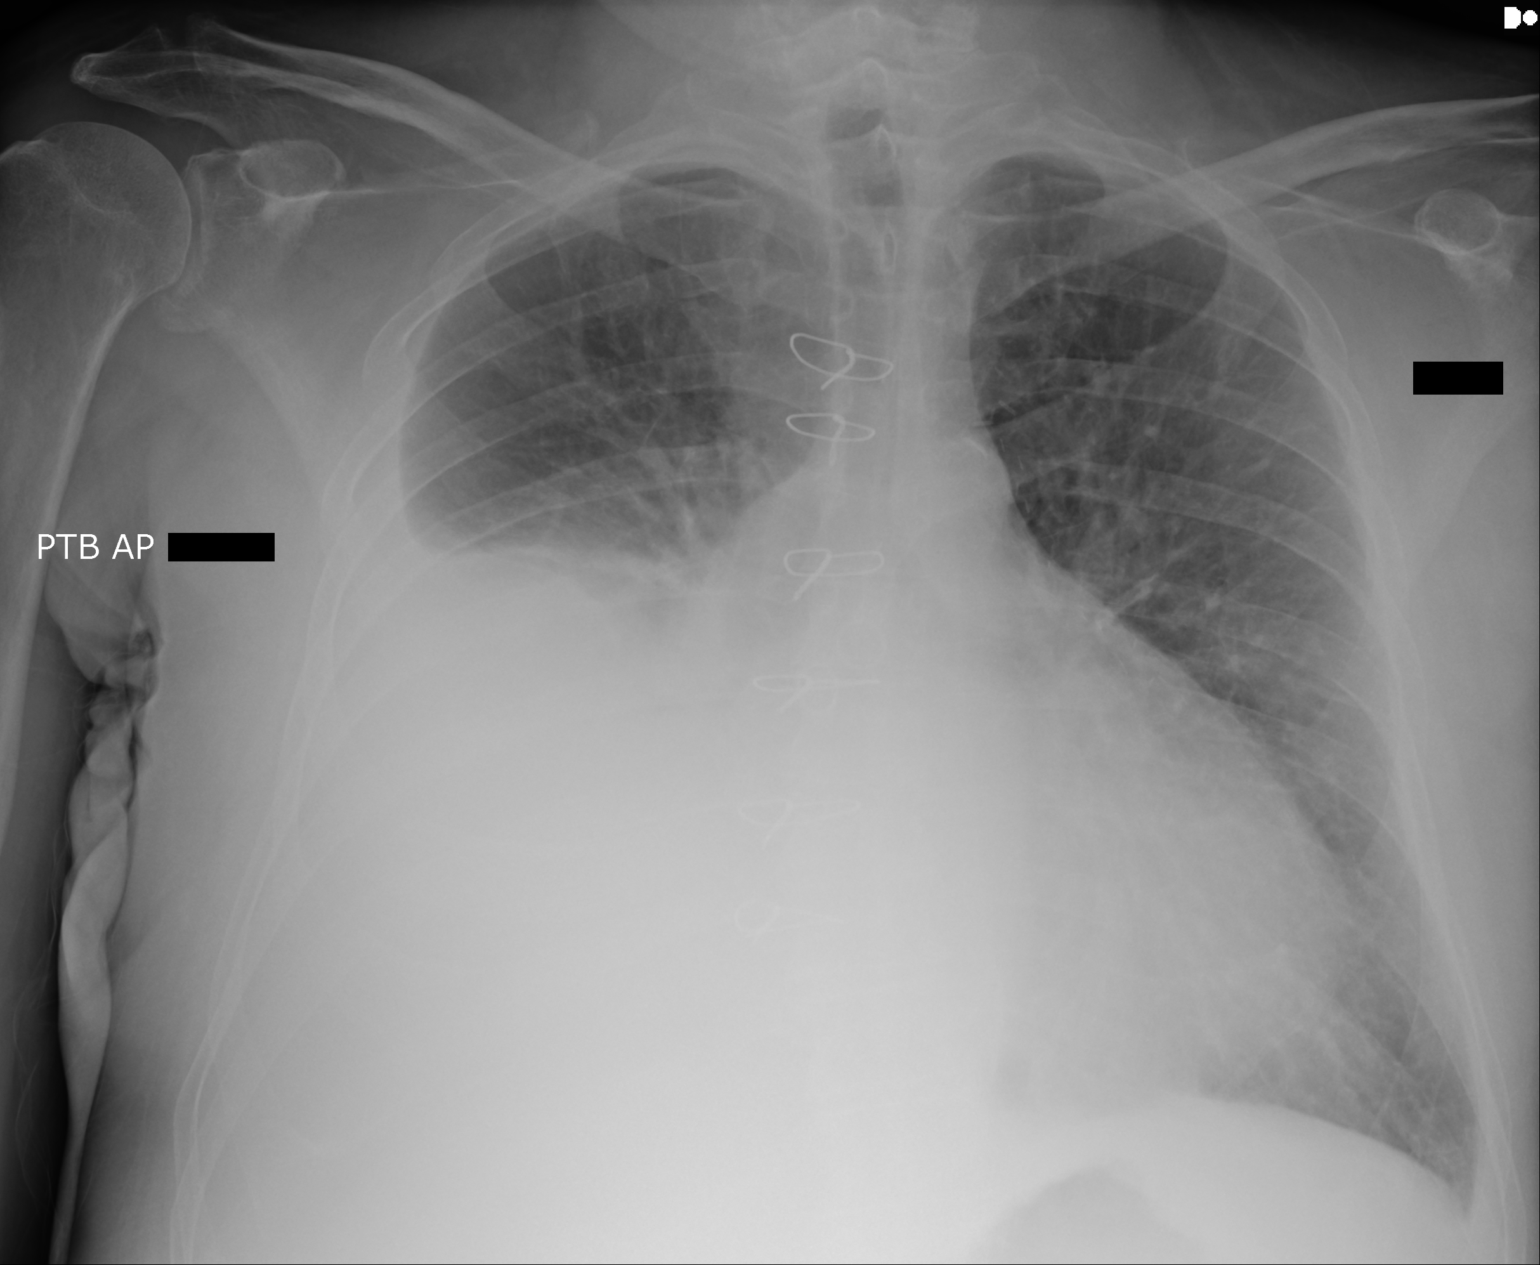

[1 of 1 positions shown; findings below may reference images not displayed]

FINDINGS: Opacification throughout the right mid and lower chest region.
Findings are consistent with a large right pleural effusion.
Prominent lung markings may represent mild edema. Heart size is
upper limits of normal. Patient has median sternotomy wires.
IMPRESSION: Large right pleural effusion.

Mild edema or vascular congestion.

## 2015-04-03 ENCOUNTER — Encounter: Payer: Self-pay | Admitting: *Deleted

## 2015-04-03 ENCOUNTER — Other Ambulatory Visit: Payer: Self-pay | Admitting: *Deleted

## 2015-04-03 NOTE — Patient Outreach (Signed)
[place] [facility]) Care Management   04/03/2015  Chris [name] June 13, 1941 [id number]  Chris [name] is an 74 y.o. male  Subjective: Routine home visit with pt, HIPAA verified, wife present,  Pt and wife state " we are trying to eat better, we've cut out bread and sugar and are being more careful" pt states " I scraped my arm while mowing and scraped it open, my wife is bandaging every day"  Pt states he refused to go to MD and will be seeing primary MD next week.  Objective:   Filed Vitals:   04/03/15 1223  BP: 122/60  Pulse: 70  Resp: 20  Weight: 205 lb (92.987 kg)  SpO2: 99%  Fasting CBG today 155 7 day average 204 14 day average 218 30 day average 239 ROS  Physical Exam  Constitutional: He appears well-developed.  HENT:  Head: Normocephalic.  Neck: Normal range of motion.  Cardiovascular: Normal rate.   Irregular rhythym  Respiratory: Effort normal and breath sounds normal.  GI: Soft. Bowel sounds are normal.  Pt has chronic constipation  Musculoskeletal: Normal range of motion.  Dependent edema lower extremities bil  Neurological: He is alert.  Skin: Skin is warm and dry.  One large skin tear and one small skin tear to right inner forearm, scant drainage noted, wife applying telfa non stick pad with burn-net.  4th toe right foot 0.5cm round blackish-purple flat blister noted.  Psychiatric: He has a normal mood and affect. His behavior is normal. Judgment and thought content normal.    Current Medications:   Current Outpatient Prescriptions  Medication Sig Dispense Refill  . AMITIZA 24 MCG capsule Take 24 mcg by mouth daily with breakfast.     . aspirin EC 81 MG tablet Take 81 mg by mouth daily.    Marland Kitchen atorvastatin (LIPITOR) 40 MG tablet Take 40 mg by mouth daily at 6 PM.     . Cholecalciferol (VITAMIN D-3) 5000 UNITS TABS Take 5,000 Units by mouth daily.     . furosemide (LASIX) 40 MG tablet Take 1 tablet (40 mg total) by mouth daily as needed for  fluid. 30 tablet 1  . Insulin Detemir (LEVEMIR FLEXPEN) 100 UNIT/ML Pen Inject 20 Units into the skin daily. (Patient taking differently: Inject 45 Units into the skin daily. ) 2 pen 2  . lactobacillus acidophilus (BACID) TABS tablet Take 1 tablet by mouth daily.    Marland Kitchen LORazepam (ATIVAN) 0.5 MG tablet Take 0.5 mg by mouth 2 (two) times daily as needed for anxiety.    . metoprolol (LOPRESSOR) 50 MG tablet Take 50 mg by mouth 2 (two) times daily.    . ondansetron (ZOFRAN) 4 MG tablet Take 1 tablet (4 mg total) by mouth every 8 (eight) hours as needed for nausea or vomiting. 30 tablet 1  . 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    . traMADol (ULTRAM) 50 MG tablet Take 50 mg by mouth every 12 (twelve) hours as needed for severe pain.    . pantoprazole (PROTONIX) 40 MG tablet Take 1 tablet (40 mg total) by mouth 2 (two) times daily before a meal. (Patient not taking: Reported on 04/03/2015) 60 tablet 3   No current facility-administered medications for this visit.    Functional Status:   In your present state of health, do you have any difficulty performing the following activities: 01/16/2015  01/09/2015  Hearing? N N  Vision? N N  Difficulty concentrating or making decisions? N N  Walking or climbing stairs? Y Y  Dressing or bathing? N N  Doing errands, shopping? Chris [name]  Preparing Food and eating ? N -  Using the Toilet? N -  In the past six months, have you accidently leaked urine? N -  Do you have problems with loss of bowel control? N -  Managing your Medications? Y -  Managing your Finances? N -  Housekeeping or managing your Housekeeping? Y -    Fall/Depression Screening:    PHQ 2/9 Scores 04/03/2015 01/16/2015  PHQ - 2 Score 0 0    Assessment: RN CM faxed letter to primary MD Dr. Quillian Quince informing pt has 2 skin tears to right forearm and blister to right 4th toe, reported CBG  averages as well as vital signs, no signs/ symptoms infection noted today (also called MD office and reported, spoke with Chris [name]). RN CM encouraged pt and wife with their diet of limiting carbohydrates as they have both lost weight.  [facility] CM Care Plan Problem One        Patient Outreach from 04/03/2015 in [place] for Problem One  Not Active   THN Long Term Goal (31-90 days)  -- [pt attending all primary MD appointments]   [facility] Long Term Goal Met Date  -- [pt not attending all specialists appointments due to [place] Problem Two        Patient Outreach from 04/03/2015 in [place] Problem Two  knowledge deficit related to heart failure   Care Plan for Problem Two  Active   Interventions for Problem Two Long Term Goal   Reinforced CHF zones in [facility] calendar   THN Long Term Goal (31-90) days  pt will have no readmissions within 90 days   THN Long Term Goal Start Date  01/16/15   THN CM Short Term Goal #1 (0-30 days)  pt will verbalize CHF zones/ action plan within 30 days   THN CM Short Term Goal #1 Start Date  03/06/15 [goal restarted- continue to review]   [facility] CM Short Term Goal #1 Met Date   04/03/15   Interventions for Short Term Goal #2   RN CM reinforced CHF zones/ action plan and importance of daily weights and logging (which pt is doing)    [facility] CM Care Plan Problem Three        Patient Outreach from 04/03/2015 in [place] Problem Three  Knowledge deficit related to diabetes   Care Plan for Problem Three  Active   THN CM Short Term Goal #1 (0-30 days)  pt will verbalize diabetes care check list within 30 days   THN CM Short Term Goal #1 Start Date  04/03/15 Barrie Folk restarted- pt, wife need review]   Interventions for Short Term Goal #1  RN CM reinforced importance of having bedtime snack with carbohydrate/ protein, reviewed normal parameters for fasting CBG and importance of being mindful of  carbohydate intake at each meal., RN CM praised pt and his spouse for decreasing sugar, bread intake.   THN CM Short Term Goal #2 (0-30 days)  skin tears to left arm will heal without complication within 30 days.   THN CM Short Term Goal #2 Start Date  04/03/15   Interventions for Short Term Goal #2  RN  CM reviewed signs/ symptoms infection and correlation of complications with wound healing related to diabetes.        Plan: follow up with home visit on 05/03/15 Plan to discharge at next home visit or transfer to health coach if any further needs.  Jacqlyn Larsen [facility], [place] Coordinator [phone]

## 2015-05-03 ENCOUNTER — Ambulatory Visit: Payer: Commercial Managed Care - HMO | Admitting: *Deleted

## 2015-05-08 ENCOUNTER — Encounter: Payer: Self-pay | Admitting: *Deleted

## 2015-05-08 ENCOUNTER — Other Ambulatory Visit: Payer: Self-pay | Admitting: *Deleted

## 2015-05-08 NOTE — Patient Outreach (Signed)
[facility]) Care Management   05/08/2015  JAEL KOSTICK 04-Oct-1940 [id number]  Chris [name] is an 74 y.o. male  Subjective: Routine home visit with pt, HIPAA verified, wife present, pt states "I'm doing pretty good right now" no changes reported, no new concerns verbalized.  Objective:   Filed Vitals:   05/08/15 1358  BP: 138/58  Pulse: 60  Resp: 16  Weight: 207 lb (93.895 kg)  SpO2: 98%  CBG today 176 7 day average 242 14 day average 248 30 day average 215 ROS  Physical Exam  Constitutional: He is oriented to person, place, and time. He appears well-developed and well-nourished.  HENT:  Head: Normocephalic.  Neck: Normal range of motion.  Cardiovascular: Normal rate.   Irregular rhythym  Respiratory: Effort normal and breath sounds normal.  GI: Soft. Bowel sounds are normal.  Musculoskeletal: Normal range of motion. He exhibits edema.  1+ edema lower extremity bil.  Neurological: He is alert and oriented to person, place, and time.  Skin: Skin is warm and dry.  Psychiatric: He has a normal mood and affect. His behavior is normal. Judgment and thought content normal.    Current Medications:   Current Outpatient Prescriptions  Medication Sig Dispense Refill  . AMITIZA 24 MCG capsule Take 24 mcg by mouth daily with breakfast.     . aspirin EC 81 MG tablet Take 81 mg by mouth daily.    Marland Kitchen atorvastatin (LIPITOR) 40 MG tablet Take 40 mg by mouth daily at 6 PM.     . Cholecalciferol (VITAMIN D-3) 5000 UNITS TABS Take 5,000 Units by mouth daily.     . furosemide (LASIX) 40 MG tablet Take 1 tablet (40 mg total) by mouth daily as needed for fluid. (Patient taking differently: Take 40 mg by mouth 2 (two) times daily. ) 30 tablet 1  . Insulin Detemir (LEVEMIR FLEXPEN) 100 UNIT/ML Pen Inject 20 Units into the skin daily. (Patient taking differently: Inject 45 Units into the skin daily. ) 2 pen 2  . lactobacillus acidophilus (BACID) TABS tablet Take 1 tablet by  mouth daily.    Marland Kitchen LORazepam (ATIVAN) 0.5 MG tablet Take 0.5 mg by mouth 2 (two) times daily as needed for anxiety.    . metoprolol (LOPRESSOR) 50 MG tablet Take 50 mg by mouth 2 (two) times daily.    . 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    . ondansetron (ZOFRAN) 4 MG tablet Take 1 tablet (4 mg total) by mouth every 8 (eight) hours as needed for nausea or vomiting. (Patient not taking: Reported on 05/08/2015) 30 tablet 1  . pantoprazole (PROTONIX) 40 MG tablet Take 1 tablet (40 mg total) by mouth 2 (two) times daily before a meal. (Patient not taking: Reported on 04/03/2015) 60 tablet 3  . traMADol (ULTRAM) 50 MG tablet Take 50 mg by mouth every 12 (twelve) hours as needed for severe pain.     No current facility-administered medications for this visit.    Functional Status:   In your present state of health, do you have any difficulty performing the following activities: 01/16/2015 01/09/2015  Hearing? N N  Vision? N N  Difficulty concentrating or making decisions? N N  Walking or climbing stairs? Y Y  Dressing or bathing? N N  Doing errands, shopping? [name]  Preparing Food and eating ? N -  Using the  Toilet? N -  In the past six months, have you accidently leaked urine? N -  Do you have problems with loss of bowel control? N -  Managing your Medications? Y -  Managing your Finances? N -  Housekeeping or managing your Housekeeping? Y -    Fall/Depression Screening:    PHQ 2/9 Scores 04/03/2015 01/16/2015  PHQ - 2 Score 0 0    Assessment:  Pt is weighing daily and recording, checking blood sugar daily and recording, pt has all medications and wife oversees, pt has all medications and reports taking as prescribed, RN CM reviewed medications with pt and wife.  RN CM discussed discharge today as all goals are met, pt and wife agreeable with discharge and pt refuses RN health  coach monthly telephonic outreach citing his wife "takes care of him and that's all he needs"  RN CM faxed case closure letter to Dr. Quillian Quince and to patients' home.  [facility] CM Care Plan Problem One        Patient Outreach from 05/08/2015 in [place] for Problem One  Not Active   [facility] Long Term Goal (31-90 days)  -- [pt attending all primary MD appointments]   [facility] Long Term Goal Met Date  -- [pt not attending all specialists appointments due to [place] Problem Two        Patient Outreach from 05/08/2015 in [place] Problem Two  knowledge deficit related to heart failure   Care Plan for Problem Two  Active   Interventions for Problem Two Long Term Goal   Reviewed CHF zones in [facility] calendar   [facility] Long Term Goal (31-90) days  pt will have no readmissions within 90 days   [facility] Long Term Goal Start Date  01/16/15   [facility] Long Term Goal Met Date  05/08/15   [facility] CM Short Term Goal #1 Start Date  -- [goal restarted- continue to review]    [facility] CM Care Plan Problem Three        Patient Outreach from 05/08/2015 in [place] Problem Three  Knowledge deficit related to diabetes   Care Plan for Problem Three  Active   [facility] CM Short Term Goal #1 (0-30 days)  pt will verbalize diabetes care check list within 30 days   [facility] CM Short Term Goal #1 Start Date  04/03/15 Barrie Folk restarted- pt, wife need review]   [facility] CM Short Term Goal #1 Met Date  05/08/15 [pt verbalizes]   Interventions for Short Term Goal #1  RN CM reiterated importance of having bedtime snack with carbohydrate/ protein, reviewed normal parameters for fasting CBG and importance of being mindful of carbohydate intake at each meal.   [facility] CM Short Term Goal #2 (0-30 days)  skin tears to left arm will heal without complication within 30 days.   [facility] CM Short Term Goal #2 Start Date  04/03/15   [facility] CM Short Term Goal #2 Met Date  05/08/15 [healed without  complication]      Plan: Case closed today.  Jacqlyn Larsen [facility], [place] Coordinator [phone]

## 2015-05-09 ENCOUNTER — Other Ambulatory Visit: Payer: Self-pay | Admitting: Pharmacist

## 2015-05-09 NOTE — Patient Outreach (Signed)
[facility]) Care Management  [place]   05/09/2015  Chris [name] [birth year] [id number]   Patient has no further pharmacy needs at this time. Will close out of pharmacy program.  Nicoletta Ba, PharmD, [place] [phone]

## 2015-05-11 NOTE — Patient Outreach (Signed)
[place] [facility]) Care Management  05/08/2015  Chris [name] 12/01/1940 [id number]   Notification from Jacqlyn Larsen, RN to close case due to goals met with [place] Management.  Thanks, Ronnell Freshwater. [place], [place] Assistant Phone: [phone] Fax: [phone]

## 2015-05-26 IMAGING — CT CT ABD-PELV W/O CM
2 of 4 series · 17 of 46 positions shown, 19 images · non-contrast
Comparison: 08/06/2013

CLINICAL DATA: Abdominal pain.

EXAM:
CT ABDOMEN AND PELVIS WITHOUT CONTRAST
TECHNIQUE: Multidetector CT imaging of the abdomen and pelvis was performed
following the standard protocol without intravenous contrast.

[Series 2: abd/ pelvis 5.0 i30f 1 · axial · 0.88mm/px · z∈[-525,-125]mm · 14 of 88 slices shown, 16 images]
[im 4/88  soft-tissue]
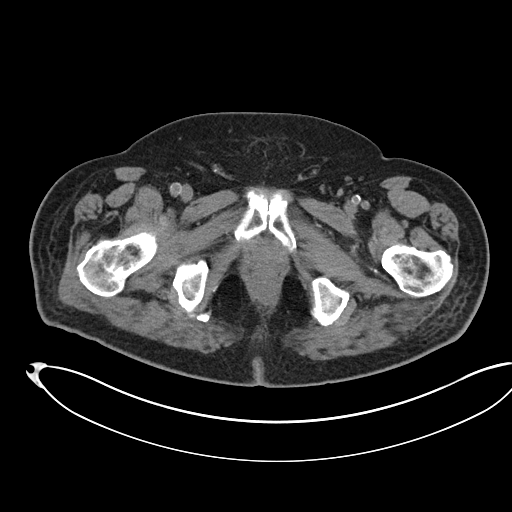
[im 4/88  bone]
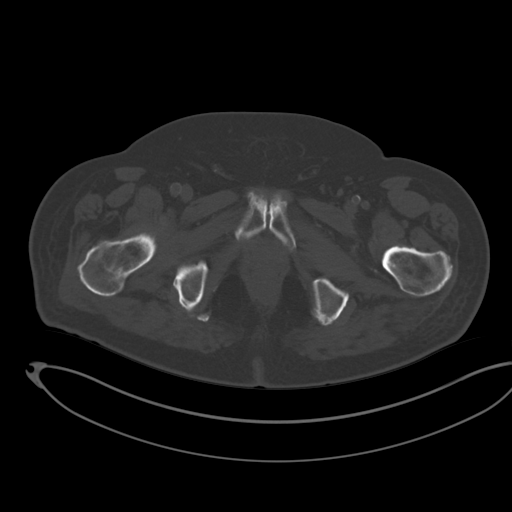
[im 11/88  soft-tissue]
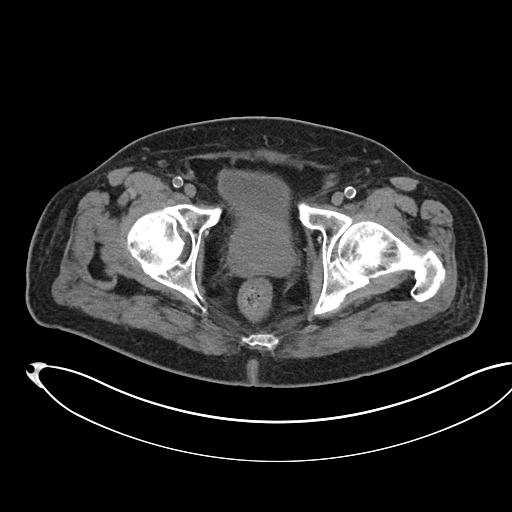
[im 18/88  soft-tissue]
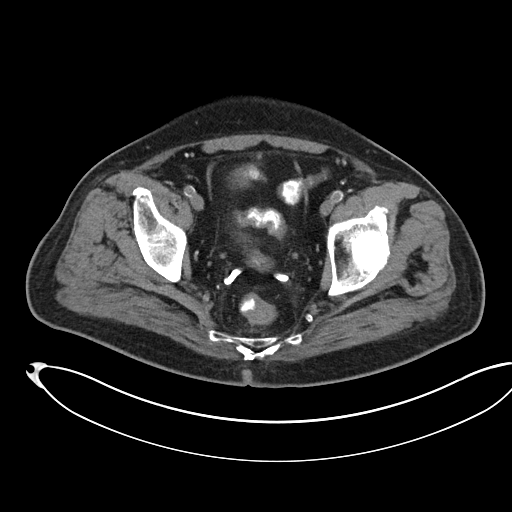
[im 25/88  soft-tissue]
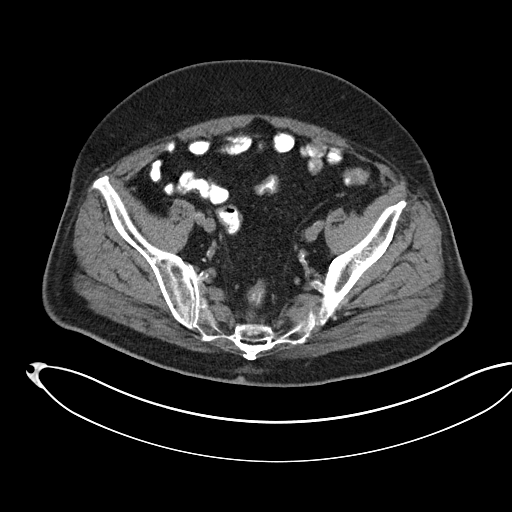
[im 28/88  soft-tissue]
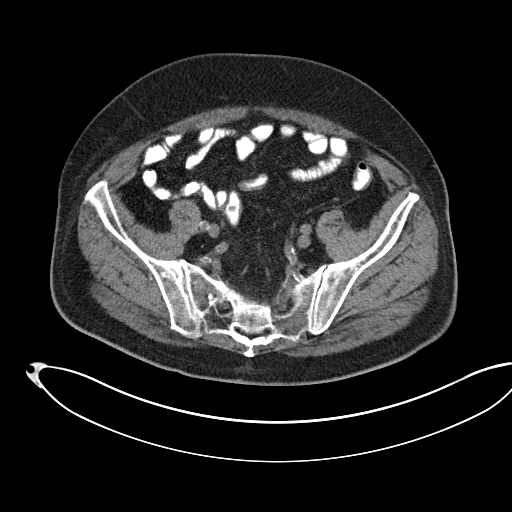
[im 35/88  soft-tissue]
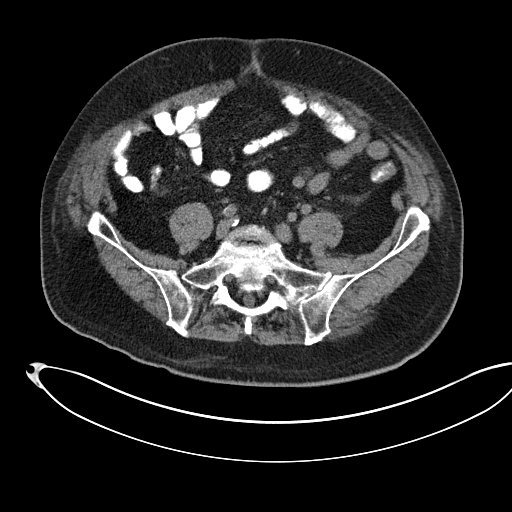
[im 42/88  soft-tissue]
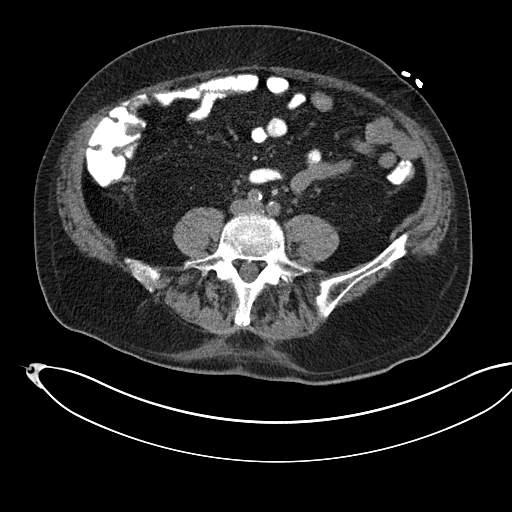
[im 46/88  soft-tissue]
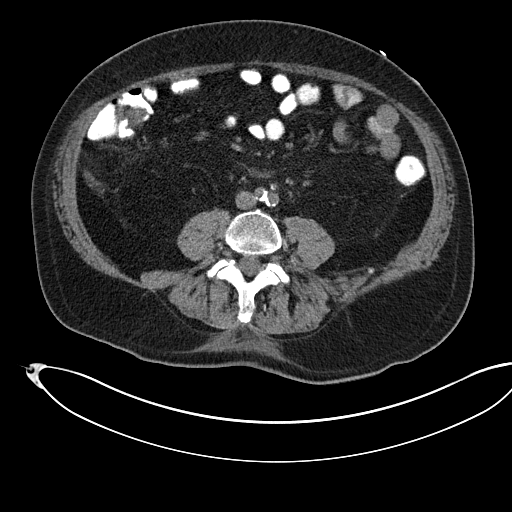
[im 53/88  soft-tissue]
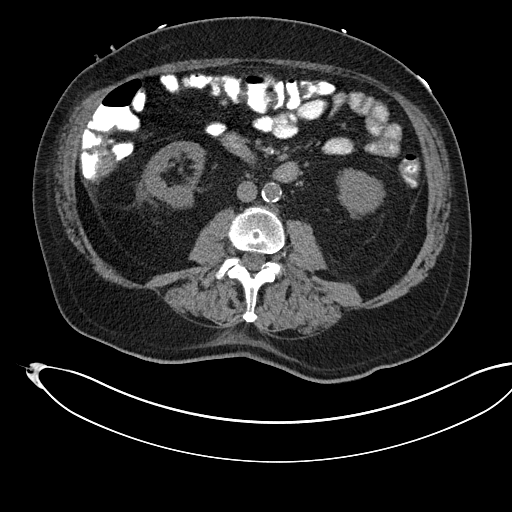
[im 53/88  bone]
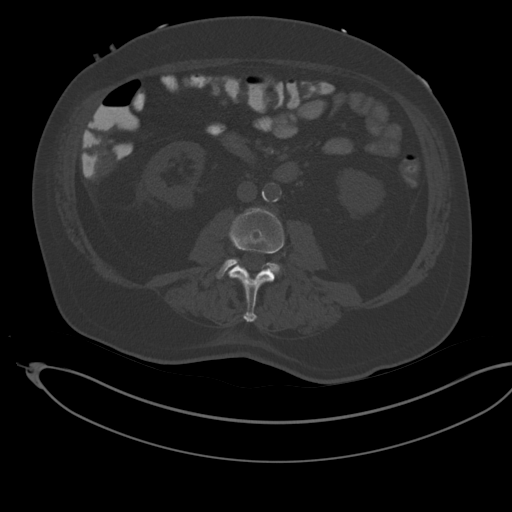
[im 60/88  soft-tissue]
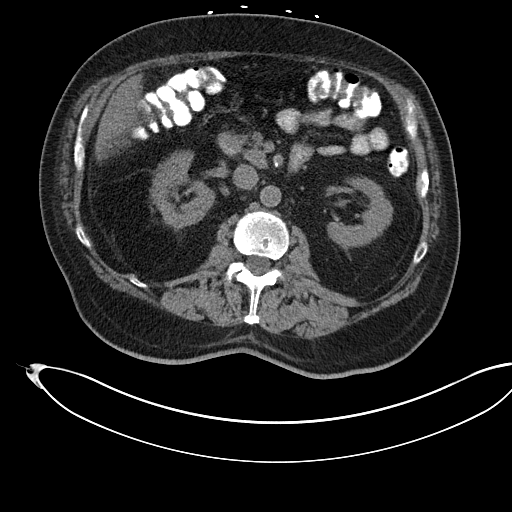
[im 67/88  soft-tissue]
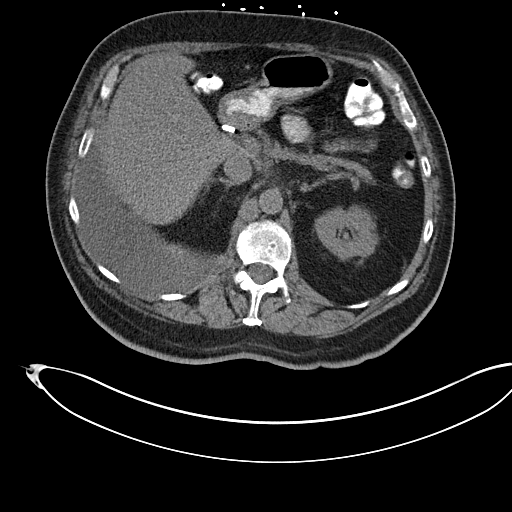
[im 70/88  soft-tissue]
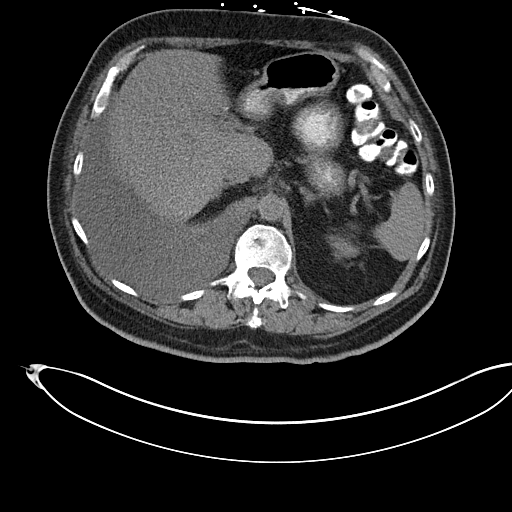
[im 77/88  soft-tissue]
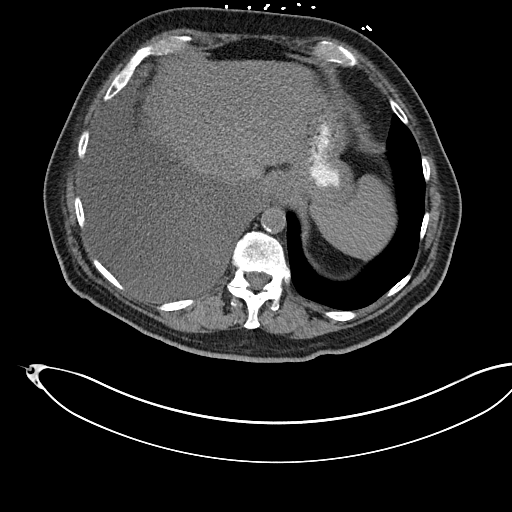
[im 84/88  soft-tissue]
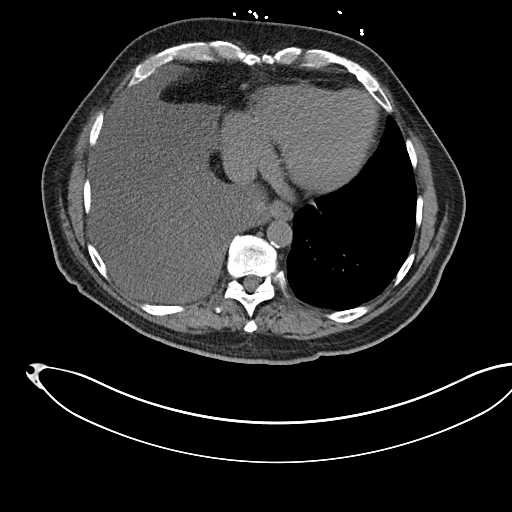

[Series 5: cor st · coronal · 0.86mm/px · 3 of 101 slices shown]
[im 34/101  soft-tissue]
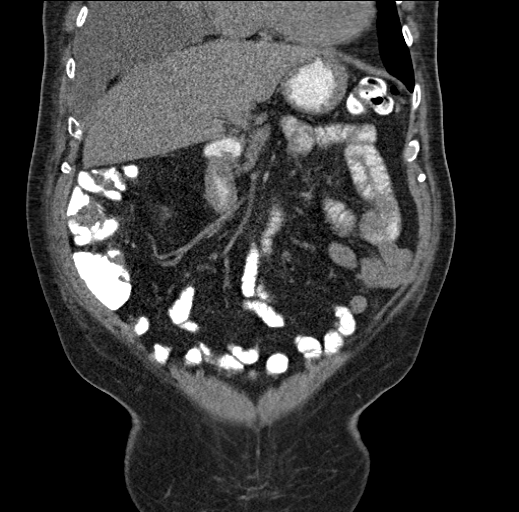
[im 45/101  soft-tissue]
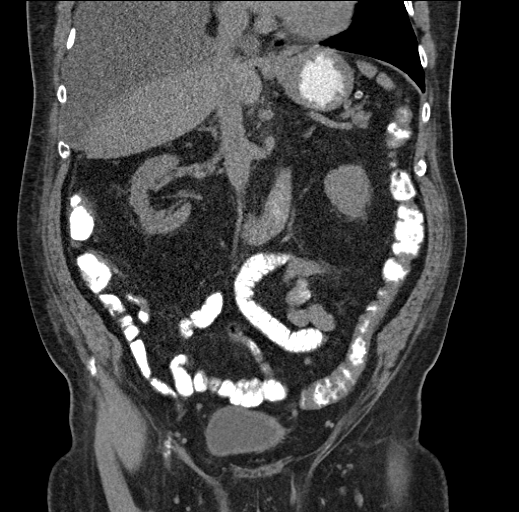
[im 56/101  soft-tissue]
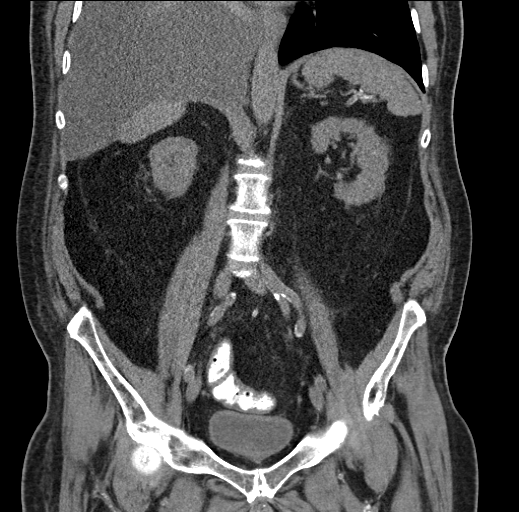

[17 of 46 positions shown; findings below may reference images not displayed]

FINDINGS: LOWER CHEST: Coronary artery atherosclerosis. Large, water density
right pleural effusion.

ABDOMEN/PELVIS:

Liver: No focal abnormality.

Biliary: Cholecystectomy.

Pancreas: Diffuse atrophy, without ductal dilatation.

Spleen: Unremarkable.

Adrenals: Unremarkable.

Kidneys and ureters: Bilateral renal cortical thinning. No
hydronephrosis or nephrolithiasis.

Bladder: Circumferential bladder wall thickening, likely from
chronic outlet obstruction.

.

Reproductive: Enlarged prostate, measuring 6 cm in transverse span
and deforming the bladder base.

Bowel: No bowel obstruction. Thickening of the posteromedial wall of
the mid duodenum is stable dating back to 2553, likely redundant
folds. No pericecal inflammation.

Retroperitoneum: No mass or adenopathy.

Peritoneum: No free fluid or gas.

Vascular: Diffuse arterial calcification.

OSSEOUS: Remote L1 superior endplate fracture. No acute osseous
findings.
IMPRESSION: 1. Stable exam.  No acute intra-abdominal findings.
2. Large right pleural effusion.

## 2015-05-29 IMAGING — US US RENAL
1 series · 14 of 22 positions shown · non-contrast
Comparison: None.

CLINICAL DATA: Acute renal insufficiency, history of diabetes and
hypertension.

EXAM:
RENAL/URINARY TRACT ULTRASOUND COMPLETE

[Series 1: us renal · 0.23mm/px · 14 of 22 slices shown]
[im 1/22]
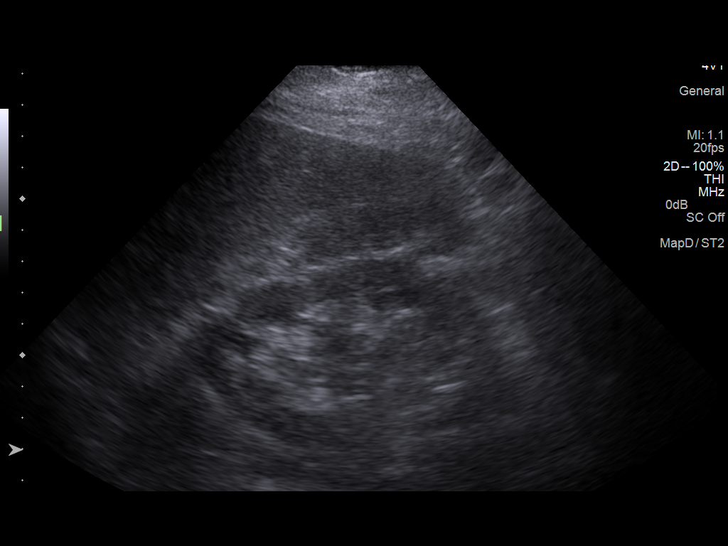
[im 3/22]
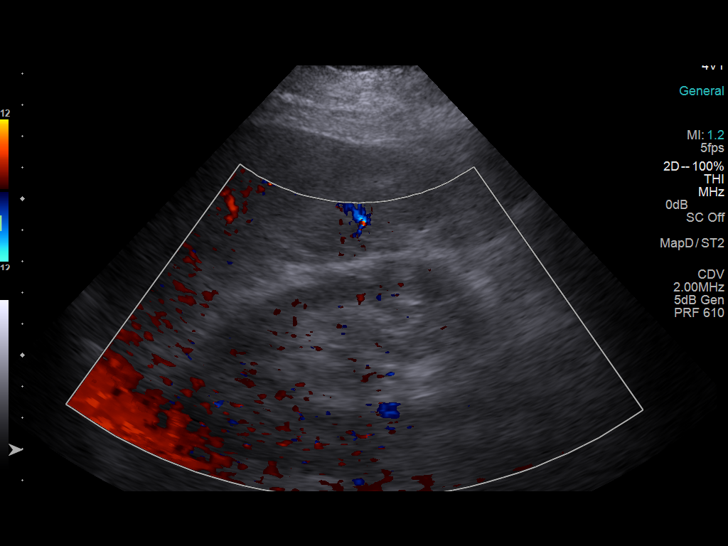
[im 4/22]
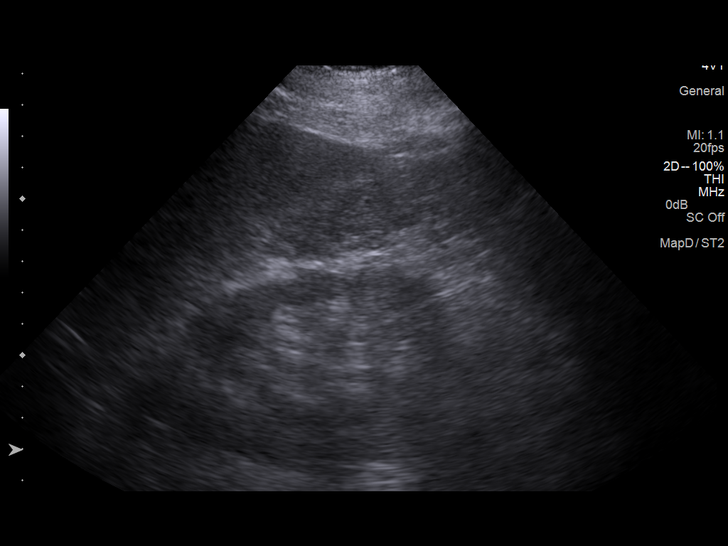
[im 6/22]
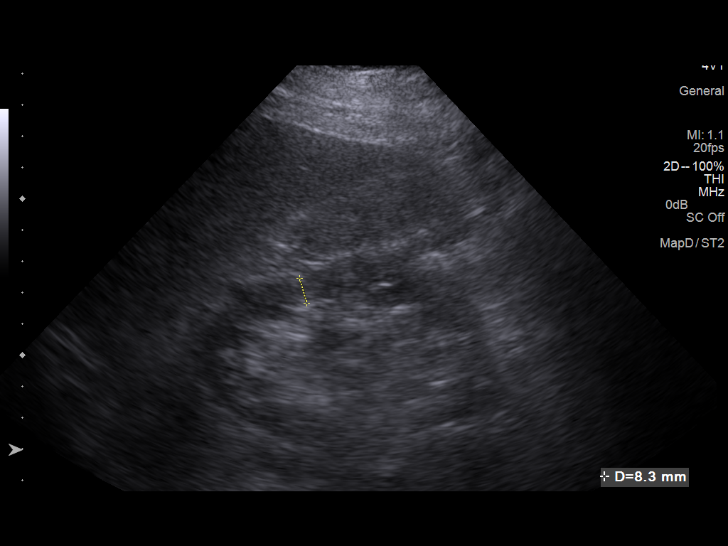
[im 8/22]
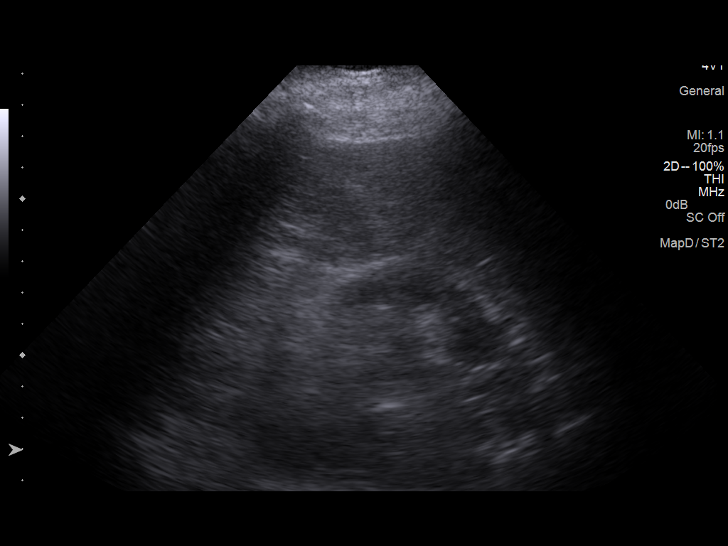
[im 9/22]
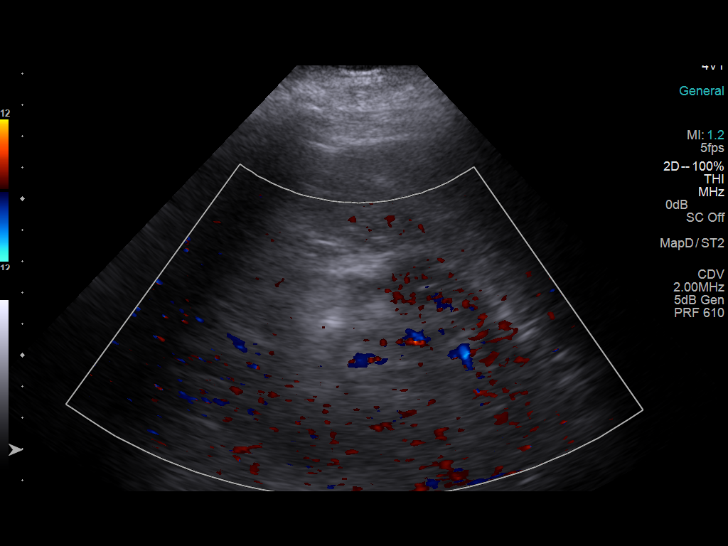
[im 11/22]
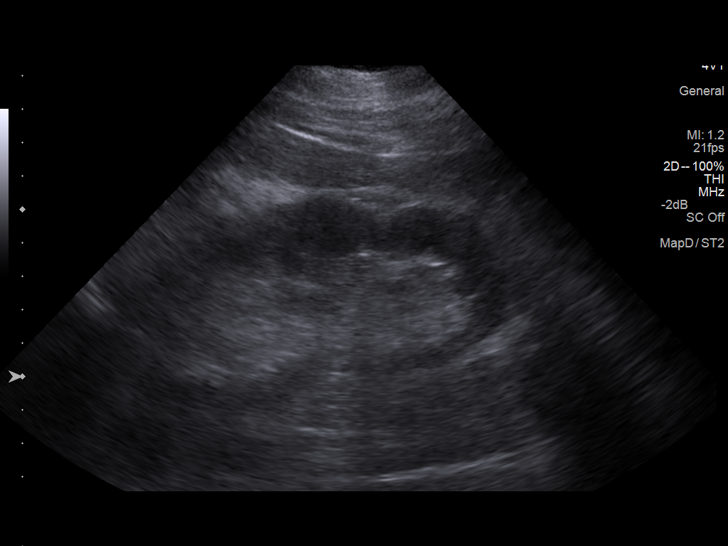
[im 12/22]
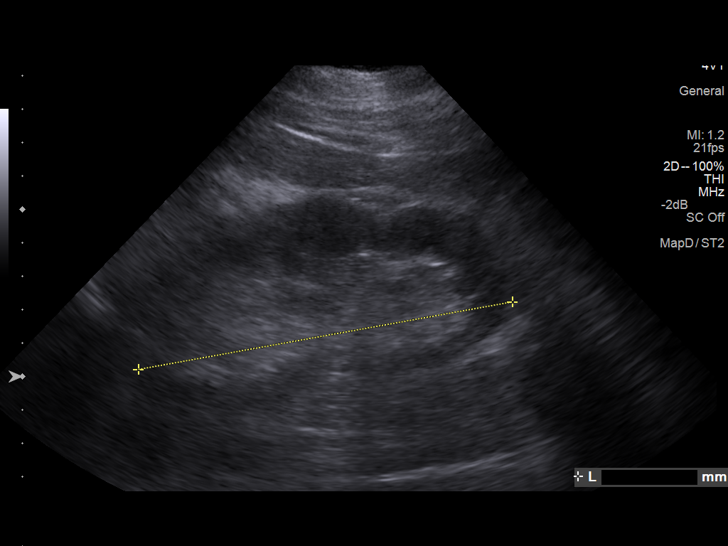
[im 14/22]
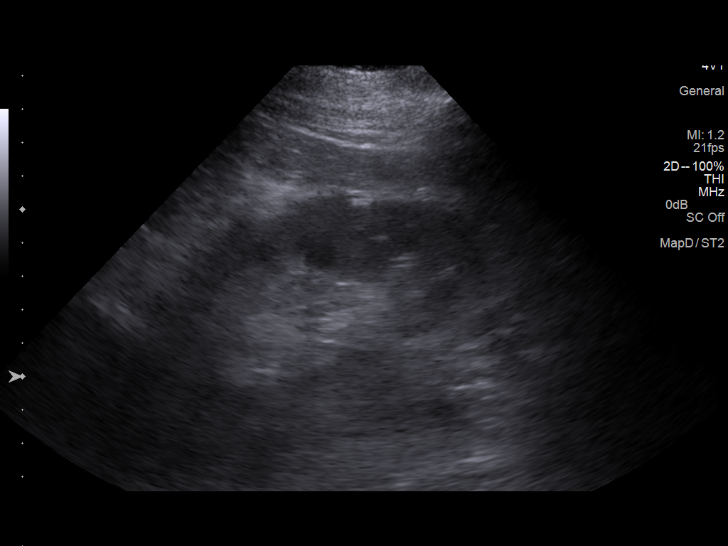
[im 15/22]
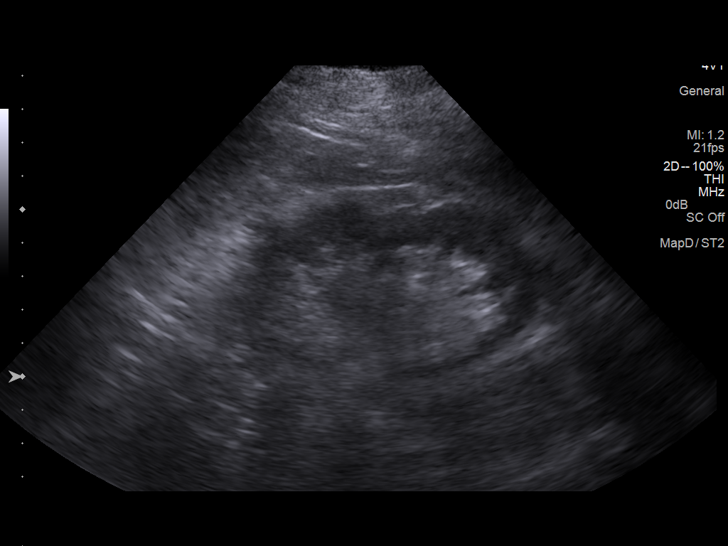
[im 17/22]
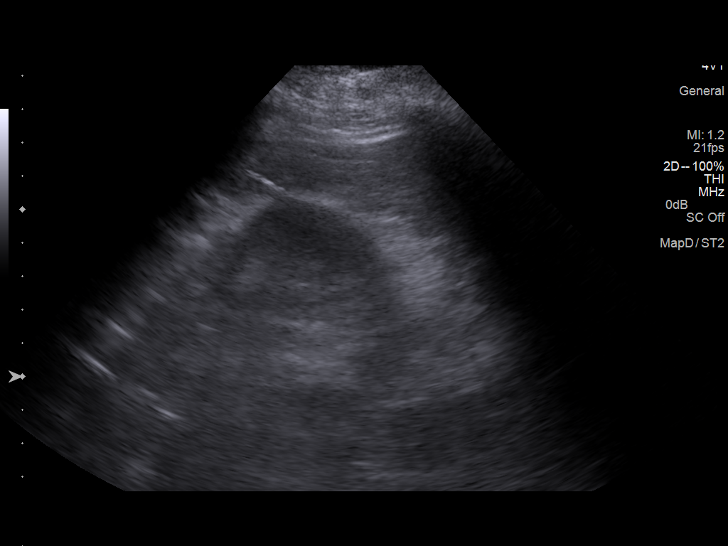
[im 19/22]
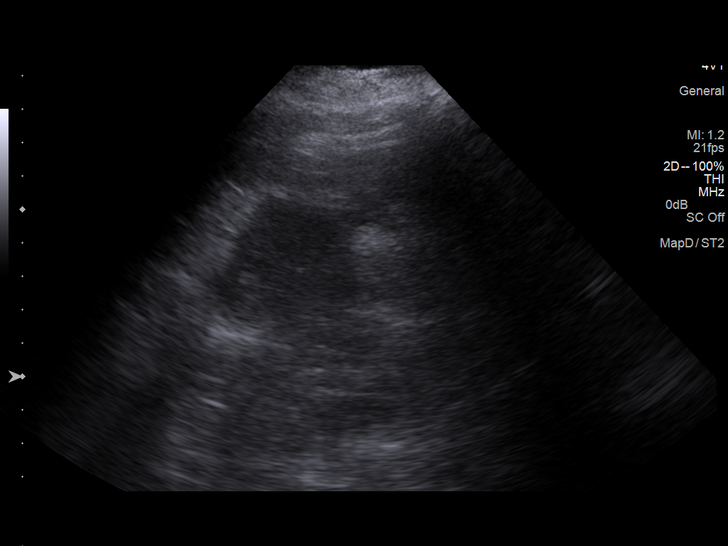
[im 20/22]
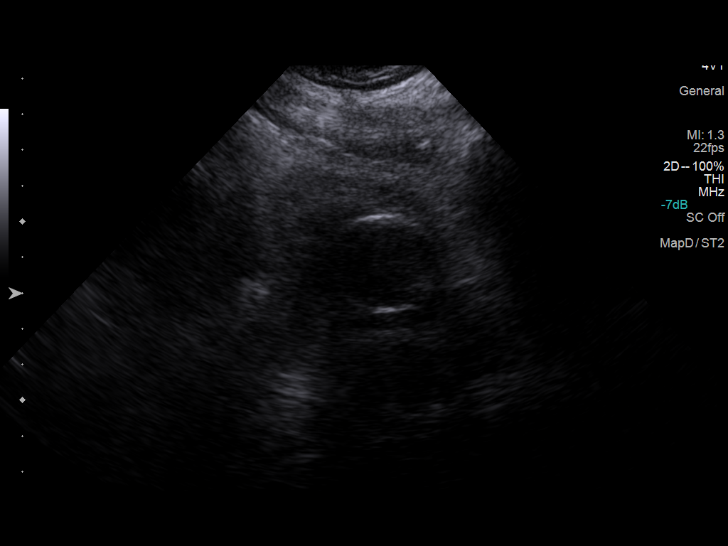
[im 22/22]
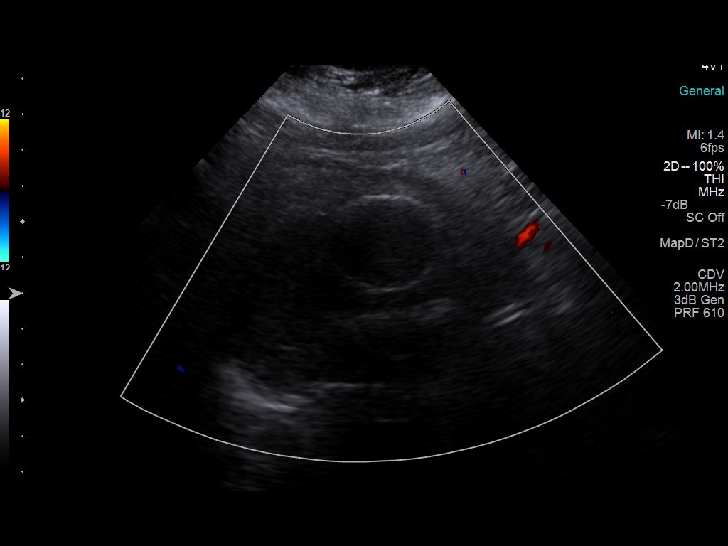

[14 of 22 positions shown; findings below may reference images not displayed]

FINDINGS: Right Kidney:

Length: 10.5 cm. The renal cortical echotexture is increased and is
approximately equal to that of the adjacent liver. There is mild
cortical thinning diffusely. There is no focal mass nor ductal
dilation. There is no hydronephrosis.

Left Kidney:

Length: 11.4 cm. The cortical echotexture on the left is
subjectively mildly increased. There is mild cortical thinning.
There is no focal renal mass nor evidence of hydronephrosis on the
left.

Bladder:

The urinary bladder is decompressed.  A Foley catheter is present.
IMPRESSION: 1. Neither kidney exhibits evidence of obstruction.
2. There is mild cortical atrophy. The echotexture of the renal
cortex bilaterally is increased suggesting medical renal disease.

## 2015-11-26 DIAGNOSIS — N189 Chronic kidney disease, unspecified: Secondary | ICD-10-CM

## 2015-11-26 DIAGNOSIS — N184 Chronic kidney disease, stage 4 (severe): Secondary | ICD-10-CM | POA: Insufficient documentation

## 2015-11-26 DIAGNOSIS — D631 Anemia in chronic kidney disease: Secondary | ICD-10-CM | POA: Insufficient documentation

## 2015-12-13 ENCOUNTER — Emergency Department (HOSPITAL_COMMUNITY): Payer: Commercial Managed Care - HMO

## 2015-12-13 ENCOUNTER — Encounter (HOSPITAL_COMMUNITY): Payer: Self-pay | Admitting: Emergency Medicine

## 2015-12-13 ENCOUNTER — Emergency Department (HOSPITAL_COMMUNITY)
Admission: EM | Admit: 2015-12-13 | Discharge: 2015-12-13 | Discharge status: Against Medical Advice | Payer: Commercial Managed Care - HMO | Attending: Emergency Medicine | Admitting: Emergency Medicine

## 2015-12-13 DIAGNOSIS — I509 Heart failure, unspecified: Secondary | ICD-10-CM | POA: Insufficient documentation

## 2015-12-13 DIAGNOSIS — K5900 Constipation, unspecified: Secondary | ICD-10-CM | POA: Insufficient documentation

## 2015-12-13 DIAGNOSIS — I252 Old myocardial infarction: Secondary | ICD-10-CM | POA: Insufficient documentation

## 2015-12-13 DIAGNOSIS — E119 Type 2 diabetes mellitus without complications: Secondary | ICD-10-CM | POA: Insufficient documentation

## 2015-12-13 DIAGNOSIS — I110 Hypertensive heart disease with heart failure: Secondary | ICD-10-CM | POA: Diagnosis not present

## 2015-12-13 DIAGNOSIS — R1031 Right lower quadrant pain: Secondary | ICD-10-CM | POA: Diagnosis present

## 2015-12-13 DIAGNOSIS — R600 Localized edema: Secondary | ICD-10-CM | POA: Insufficient documentation

## 2015-12-13 DIAGNOSIS — Z794 Long term (current) use of insulin: Secondary | ICD-10-CM | POA: Diagnosis not present

## 2015-12-13 DIAGNOSIS — Z79899 Other long term (current) drug therapy: Secondary | ICD-10-CM | POA: Diagnosis not present

## 2015-12-13 DIAGNOSIS — Z7982 Long term (current) use of aspirin: Secondary | ICD-10-CM | POA: Diagnosis not present

## 2015-12-13 DIAGNOSIS — E785 Hyperlipidemia, unspecified: Secondary | ICD-10-CM | POA: Insufficient documentation

## 2015-12-13 DIAGNOSIS — J90 Pleural effusion, not elsewhere classified: Secondary | ICD-10-CM | POA: Diagnosis not present

## 2015-12-13 DIAGNOSIS — R140 Abdominal distension (gaseous): Secondary | ICD-10-CM

## 2015-12-13 LAB — COMPREHENSIVE METABOLIC PANEL
ALT: 25 U/L (ref 17–63)
ANION GAP: 8 (ref 5–15)
AST: 29 U/L (ref 15–41)
Albumin: 3.7 g/dL (ref 3.5–5.0)
Alkaline Phosphatase: 106 U/L (ref 38–126)
BUN: 52 mg/dL — AB (ref 6–20)
CHLORIDE: 98 mmol/L — AB (ref 101–111)
CO2: 31 mmol/L (ref 22–32)
Calcium: 8.7 mg/dL — ABNORMAL LOW (ref 8.9–10.3)
Creatinine, Ser: 2.57 mg/dL — ABNORMAL HIGH (ref 0.61–1.24)
GFR calc Af Amer: 26 mL/min — ABNORMAL LOW (ref >60)
GFR calc non Af Amer: 23 mL/min — ABNORMAL LOW (ref >60)
GLUCOSE: 229 mg/dL — AB (ref 65–99)
Potassium: 4.3 mmol/L (ref 3.5–5.1)
Sodium: 137 mmol/L (ref 135–145)
Total Bilirubin: 1.6 mg/dL — ABNORMAL HIGH (ref 0.3–1.2)
Total Protein: 7.9 g/dL (ref 6.5–8.1)

## 2015-12-13 LAB — BRAIN NATRIURETIC PEPTIDE: B NATRIURETIC PEPTIDE 5: 281 pg/mL — AB (ref 0.0–100.0)

## 2015-12-13 LAB — CBC
HEMATOCRIT: 36.3 % — AB (ref 39.0–52.0)
HEMOGLOBIN: 12 g/dL — AB (ref 13.0–17.0)
MCH: 34.7 pg — ABNORMAL HIGH (ref 26.0–34.0)
MCHC: 33.1 g/dL (ref 30.0–36.0)
MCV: 104.9 fL — AB (ref 78.0–100.0)
Platelets: 188 10*3/uL (ref 150–400)
RBC: 3.46 MIL/uL — ABNORMAL LOW (ref 4.22–5.81)
RDW: 13 % (ref 11.5–15.5)
WBC: 6.6 10*3/uL (ref 4.0–10.5)

## 2015-12-13 LAB — URINALYSIS, ROUTINE W REFLEX MICROSCOPIC
Bilirubin Urine: NEGATIVE
Glucose, UA: NEGATIVE mg/dL
HGB URINE DIPSTICK: NEGATIVE
Ketones, ur: NEGATIVE mg/dL
Leukocytes, UA: NEGATIVE
Nitrite: NEGATIVE
PH: 6.5 (ref 5.0–8.0)
Protein, ur: NEGATIVE mg/dL
SPECIFIC GRAVITY, URINE: 1.015 (ref 1.005–1.030)

## 2015-12-13 LAB — LIPASE, BLOOD: LIPASE: 32 U/L (ref 11–51)

## 2015-12-13 MED ORDER — ONDANSETRON HCL 4 MG/2ML IJ SOLN
4.0000 mg | Freq: Once | INTRAMUSCULAR | Status: AC
  Administered 2015-12-13: 4 mg via INTRAVENOUS
  Filled 2015-12-13: qty 2

## 2015-12-13 MED ORDER — SODIUM CHLORIDE 0.9 % IV SOLN
INTRAVENOUS | Status: DC
  Administered 2015-12-13: 13:00:00 via INTRAVENOUS

## 2015-12-13 MED ORDER — FUROSEMIDE 10 MG/ML IJ SOLN: 80.0000 mg | Freq: Once | INTRAMUSCULAR | Status: DC

## 2015-12-13 NOTE — ED Provider Notes (Signed)
CSN: [id number]     Arrival date & time 12/13/15  1037 History   First MD Initiated Contact with Patient 12/13/15 1158     Chief Complaint  Patient presents with  . Abdominal Pain     (Consider location/radiation/quality/duration/timing/severity/associated sxs/prior Treatment) Patient is a 75 y.o. male presenting with abdominal pain. The history is provided by the patient.  Abdominal Pain Associated symptoms: constipation, nausea and shortness of breath   Associated symptoms: no chest pain, no diarrhea, no fever and no vomiting    patient reports dehydration for 1 week. Reports that his belly is been getting bigger and more distended. Causing some discomfort but no severe pain. Patient at times has some increased discomfort when he eats. States it's mostly right lower quadrant left lower quadrant. Patient feels is related to constipation has not had a bowel movement in 4 days.  Past Medical History  Diagnosis Date  . Acute cerebrovascular accident [facility])     presumed embolic from his atrial fibrillation, with hemorrhagic conbersion  . Diabetes mellitus   . Macrocytosis     normal B12 & folate levels  . Ischemic cardiomyopathy     EF of 45-50% - posterior & inferior walls are severly hypokinetic  . Atrial fibrillation (HCC)     not candidate for chronic anticoagulation with Coumadin given chrronic epistaxis as well as hemorrhagic conversion of his acute cva   . Hyperlipidemia   . Hypertension   . Complication of anesthesia   . PONV (postoperative nausea and vomiting)   . Acute renal insufficiency     resolved  . Myocardial infarct ([place])   . CHF (congestive heart failure) ([place])   . Shortness of breath   . Pleural effusion 10/05/2013  . Tubular adenoma    Past Surgical History  Procedure Laterality Date  . Coronary artery bypass graft    . Tonsillectomy    . Esophagogastroduodenoscopy (egd) with esophageal dilation N/A 10/27/2012    Dr. Gala Romney: distal esophageal diverticulum,  non-complete Schatzki's ring s/p dilation, distal esophageal nodule with benign path, negative Barrett's  . Cholecystectomy  2008  . Colonoscopy N/A 12/23/2012    Dr. Gala Romney: multiple rectal and colonic polyps removed, tubular adenomas. needs surveillance April 2017.   . Cardiac surgery    . Video assisted thoracoscopy Right 10/31/2013    Procedure: VIDEO ASSISTED THORACOSCOPY;  Surgeon: Melrose Nakayama, MD;  Location: [place];  Service: Thoracic;  Laterality: Right;  . Pleural effusion drainage Right 10/31/2013    Procedure: DRAINAGE OF PLEURAL EFFUSION;  Surgeon: Melrose Nakayama, MD;  Location: [place];  Service: Thoracic;  Laterality: Right;  . Chest tube insertion Right 10/31/2013    Procedure: INSERTION PLEURAL DRAINAGE CATHETER;  Surgeon: Melrose Nakayama, MD;  Location: [place];  Service: Thoracic;  Laterality: Right;   Family History  Problem Relation Age of Onset  . Colon cancer Neg Hx   . Colon polyps Sister   . Colon polyps Sister    Social History  Substance Use Topics  . Smoking status: Never Smoker   . Smokeless tobacco: Never Used  . Alcohol Use: No    Review of Systems  Constitutional: Negative for fever.  HENT: Negative for congestion.   Eyes: Negative for visual disturbance.  Respiratory: Positive for shortness of breath.   Cardiovascular: Positive for leg swelling. Negative for chest pain.  Gastrointestinal: Positive for nausea, abdominal pain, constipation and abdominal distention. Negative for vomiting and diarrhea.  Genitourinary: Positive for difficulty urinating.  Musculoskeletal: Negative for back pain.  Skin: Negative for rash.  Neurological: Negative for headaches.  Hematological: Does not bruise/bleed easily.  Psychiatric/Behavioral: Negative for confusion.      Allergies  Ciprofloxacin; Finasteride; Lisinopril; Metoclopramide; Protonix; and Xanax  Home Medications   Prior to Admission medications   Medication Sig Start Date End Date Taking?  Authorizing Provider  aspirin EC 81 MG tablet Take 81 mg by mouth daily.   Yes Historical Provider, MD  atorvastatin (LIPITOR) 40 MG tablet Take 40 mg by mouth daily at 6 PM.  01/07/14  Yes Historical Provider, MD  Cholecalciferol (VITAMIN D-3) 5000 UNITS TABS Take 5,000 Units by mouth daily.    Yes Historical Provider, MD  furosemide (LASIX) 40 MG tablet Take 1 tablet (40 mg total) by mouth daily as needed for fluid. Patient taking differently: Take 40 mg by mouth 2 (two) times daily.  01/11/15  Yes Maryann Mikhail, DO  Insulin Detemir (LEVEMIR FLEXPEN) 100 UNIT/ML Pen Inject 20 Units into the skin daily. Patient taking differently: Inject 45-46 Units into the skin daily.  11/04/13  Yes Coolidge Breeze, PA-C  lactobacillus acidophilus (BACID) TABS tablet Take 1 tablet by mouth daily.   Yes Historical Provider, MD  LORazepam (ATIVAN) 0.5 MG tablet Take 0.5 mg by mouth 2 (two) times daily as needed for anxiety.   Yes Historical Provider, MD  metoprolol (LOPRESSOR) 50 MG tablet Take 50 mg by mouth 2 (two) times daily.   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   Yes Historical Provider, MD  ranitidine (ZANTAC) 150 MG tablet Take 150 mg by mouth daily as needed for heartburn.   Yes Historical Provider, MD   BP 151/79 mmHg  Pulse 78  Temp(Src) 98.6 F (37 C) (Tympanic)  Resp 17  Ht 6\' 1"  (1.854 m)  Wt 98.884 kg  BMI 28.77 kg/m2  SpO2 93% Physical Exam  Constitutional: He is oriented to person, place, and time. He appears well-developed and well-nourished. No distress.  HENT:  Head: Normocephalic and atraumatic.  Mouth/Throat: Oropharynx is clear and moist.  Eyes: Conjunctivae and EOM are normal. Pupils are equal, round, and reactive to light.  Neck: Normal range of motion. Neck supple.  Cardiovascular: Normal rate, regular rhythm and normal heart sounds.    No murmur heard. Pulmonary/Chest: Effort normal and breath sounds normal. No respiratory distress. He has no rales.  Abdominal: Soft. Bowel sounds are normal. He exhibits distension. There is no tenderness.  Significant distention to the abdomen. But nontender to palpation. No pitting edema.  Musculoskeletal: Normal range of motion. He exhibits edema.  Bilateral lower extremity edema, pitting.  Neurological: He is alert and oriented to person, place, and time. No cranial nerve deficit. He exhibits normal muscle tone. Coordination normal.  Skin: Skin is warm. No rash noted.  Nursing note and vitals reviewed.   ED Course  Procedures (including critical care time) Labs Review Labs Reviewed  COMPREHENSIVE METABOLIC PANEL - Abnormal; Notable for the following:    Chloride 98 (*)    Glucose, Bld 229 (*)    BUN 52 (*)    Creatinine, Ser 2.57 (*)    Calcium 8.7 (*)    Total Bilirubin 1.6 (*)    GFR calc non Af Amer 23 (*)    GFR calc Af Amer 26 (*)    All other components within normal  limits  CBC - Abnormal; Notable for the following:    RBC 3.46 (*)    Hemoglobin 12.0 (*)    HCT 36.3 (*)    MCV 104.9 (*)    MCH 34.7 (*)    All other components within normal limits  LIPASE, BLOOD  URINALYSIS, ROUTINE W REFLEX MICROSCOPIC (NOT AT [facility])  BRAIN NATRIURETIC PEPTIDE    Imaging Review Ct Abdomen Pelvis Wo Contrast  12/13/2015  CLINICAL DATA:  Lower abdominal pain.  Bloating. EXAM: CT ABDOMEN AND PELVIS WITHOUT CONTRAST TECHNIQUE: Multidetector CT imaging of the abdomen and pelvis was performed following the standard protocol without IV contrast. COMPARISON:  10/05/2013 FINDINGS: Moderate left pleural effusion. Bibasilar atelectasis. Cardiomegaly. Right pleural effusion has resolved Postcholecystectomy Unenhanced liver, spleen, adrenal glands are within normal limits Pancreas is atrophic. Chronic changes of the kidneys. Normal appendix. Moderate stool burden throughout colon. There is  stranding involving intraperitoneal fat, retroperitoneal fat, and subcutaneous fat likely due to mild volume overload There is no free fluid in the abdomen. No abnormal retroperitoneal adenopathy Vascular calcifications are prominent Prostate remains enlarged.  Mild bladder wall thickening is stable. Stable L1 compression deformity IMPRESSION: There is diffuse edema compatible with mild volume overload Moderate left pleural effusion. Stable prostate enlargement. Electronically Signed   By: Marybelle Killings M.D.   On: 12/13/2015 15:06   Dg Chest 2 View  12/13/2015  CLINICAL DATA:  Abdominal distension, dehydration for 1 week EXAM: CHEST  2 VIEW COMPARISON:  01/09/2015 FINDINGS: Mild bilateral interstitial thickening. Small-moderate left pleural effusion. Left basilar atelectasis. No pneumothorax. Stable cardiomegaly. Prior CABG. The heart and mediastinal contours are unremarkable. The osseous structures are unremarkable. IMPRESSION: Small-moderate left pleural effusion with atelectasis. Cardiomegaly with mild pulmonary vascular congestion. Electronically Signed   By: Kathreen Devoid   On: 12/13/2015 14:23   I have personally reviewed and evaluated these images and lab results as part of my medical decision-making.   EKG Interpretation None      MDM   Final diagnoses:  Bilateral lower extremity edema  Pleural effusion    I was the patient admitted for up I am overload. Kidney function not sniffily worse than usual. But has evidence of bilateral lower extremity edema has evidence of soft tissue edema on the CT scan. No ascites. And a left-sided pleural effusion. Patient normally on Lasix 40 mg once a day but he has been taking it twice a day. Patient was refusing admission so be signing out AMA. Patient understands the risk.  In addition CT scan of the abdomen showed no evidence of any bowel obstruction. Did show some constipation.  Fredia Sorrow, MD 12/13/15 [phone]

## 2015-12-13 NOTE — Discharge Instructions (Signed)
As discussed we recommended that she be admitted for the fluid overload. Understand things could get worse including filling of fluid in your lungs and running into breathing problems or even dying. In come back at any time. Make an appointment to follow-up with your regular doctor. Make sure you take her Lasix at home.

## 2015-12-13 NOTE — ED Notes (Signed)
MD at bedside. 

## 2015-12-13 NOTE — ED Notes (Signed)
Pt reports dehydration x1 week.  Pt reports bloating and "i can't get comfortable in no position."  Pt had bm this morning, abd pain in RLQ and LLQ.  Wife mentioned that prior to today, pt hasnt had a bm in 4 days.

## 2015-12-27 ENCOUNTER — Encounter: Payer: Self-pay | Admitting: Gastroenterology

## 2015-12-27 ENCOUNTER — Ambulatory Visit (INDEPENDENT_AMBULATORY_CARE_PROVIDER_SITE_OTHER): Payer: Commercial Managed Care - HMO | Admitting: Gastroenterology

## 2015-12-27 ENCOUNTER — Other Ambulatory Visit: Payer: Self-pay

## 2015-12-27 VITALS — BP 131/74 | HR 68 | Temp 98.3°F | Ht 73.0 in | Wt 219.8 lb

## 2015-12-27 DIAGNOSIS — Z860100 Personal history of colon polyps, unspecified: Secondary | ICD-10-CM | POA: Insufficient documentation

## 2015-12-27 DIAGNOSIS — K5900 Constipation, unspecified: Secondary | ICD-10-CM

## 2015-12-27 MED ORDER — ONDANSETRON HCL 4 MG PO TABS: 4.0000 mg | ORAL_TABLET | Freq: Three times a day (TID) | ORAL | Status: DC | PRN

## 2015-12-27 MED ORDER — PEG 3350-KCL-NA BICARB-NACL 420 G PO SOLR: 4000.0000 mL | Freq: Once | ORAL | Status: DC

## 2015-12-27 NOTE — Patient Instructions (Signed)
Start taking Linzess 72 mcg one capsule each morning on an empty stomach. Take this for about a week. If it is not strong enough, you can try Linzess 145 mcg one capsule each morning on an empty stomach.    We have scheduled you for a colonoscopy with Dr. Gala Romney in the near future.

## 2015-12-27 NOTE — Progress Notes (Signed)
Referring Provider: Caryl Bis, MD Primary Care Physician:  Gar Ponto, MD  Primary GI: Dr. Gala Romney  Chief Complaint  Patient presents with  . Bloated  . Constipation    HPI:   Chris [name] is a 75 y.o. male presenting today with a history of multiple tubular adenomas on colonoscopy in 2014, due for surveillance now. History of constipation. Dilation in 2014. Poor historian.   States Amitiza doesn't do well. Has a time with constipation. Was very constipated and Dr. Quillian Quince had him take large doses of Miralax prn and doing better.  Abdomen will feel like it's gonna "bust". Bloated. No rectal bleeding. Would like more Zofran. Would like to have Zofran on hand just in case.   Past Medical History  Diagnosis Date  . Acute cerebrovascular accident [facility])     presumed embolic from his atrial fibrillation, with hemorrhagic conbersion  . Diabetes mellitus   . Macrocytosis     normal B12 & folate levels  . Ischemic cardiomyopathy     EF of 45-50% - posterior & inferior walls are severly hypokinetic  . Atrial fibrillation (HCC)     not candidate for chronic anticoagulation with Coumadin given chrronic epistaxis as well as hemorrhagic conversion of his acute cva   . Hyperlipidemia   . Hypertension   . Complication of anesthesia   . PONV (postoperative nausea and vomiting)   . Acute renal insufficiency     resolved  . Myocardial infarct ([place])   . CHF (congestive heart failure) ([place])   . Shortness of breath   . Pleural effusion 10/05/2013  . Tubular adenoma     Past Surgical History  Procedure Laterality Date  . Coronary artery bypass graft    . Tonsillectomy    . Esophagogastroduodenoscopy (egd) with esophageal dilation N/A 10/27/2012    Dr. Gala Romney: distal esophageal diverticulum, non-complete Schatzki's ring s/p dilation, distal esophageal nodule with benign path, negative Barrett's  . Cholecystectomy  2008  . Colonoscopy N/A 12/23/2012    Dr. Gala Romney: multiple rectal and  colonic polyps removed, tubular adenomas. needs surveillance April 2017.   . Cardiac surgery    . Video assisted thoracoscopy Right 10/31/2013    Procedure: VIDEO ASSISTED THORACOSCOPY;  Surgeon: Melrose Nakayama, MD;  Location: [place];  Service: Thoracic;  Laterality: Right;  . Pleural effusion drainage Right 10/31/2013    Procedure: DRAINAGE OF PLEURAL EFFUSION;  Surgeon: Melrose Nakayama, MD;  Location: [place];  Service: Thoracic;  Laterality: Right;  . Chest tube insertion Right 10/31/2013    Procedure: INSERTION PLEURAL DRAINAGE CATHETER;  Surgeon: Melrose Nakayama, MD;  Location: [place];  Service: Thoracic;  Laterality: Right;    Current Outpatient Prescriptions  Medication Sig Dispense Refill  . aspirin EC 81 MG tablet Take 81 mg by mouth daily.    Marland Kitchen atorvastatin (LIPITOR) 40 MG tablet Take 40 mg by mouth daily at 6 PM.     . Cholecalciferol (VITAMIN D-3) 5000 UNITS TABS Take 5,000 Units by mouth daily.     . furosemide (LASIX) 40 MG tablet Take 1 tablet (40 mg total) by mouth daily as needed for fluid. (Patient taking differently: Take 40 mg by mouth 2 (two) times daily. ) 30 tablet 1  . insulin detemir (LEVEMIR) 100 UNIT/ML injection Inject 50 Units into the skin every morning.    . lactobacillus acidophilus (BACID) TABS tablet Take 1 tablet by mouth daily.    Marland Kitchen LORazepam (ATIVAN) 0.5 MG tablet Take  0.5 mg by mouth 2 (two) times daily as needed for anxiety.    . metoprolol (LOPRESSOR) 50 MG tablet Take 50 mg by mouth 2 (two) times daily.    . 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    . ranitidine (ZANTAC) 150 MG tablet Take 150 mg by mouth daily as needed for heartburn.     No current facility-administered medications for this visit.    Allergies as of 12/27/2015 - Review Complete 12/27/2015  Allergen Reaction Noted  . Ciprofloxacin Other (See Comments) 10/07/2012   . Finasteride  08/03/2013  . Lisinopril Other (See Comments) 10/26/2013  . Metoclopramide  10/04/2013  . Protonix [pantoprazole sodium]  01/06/2014  . Xanax [alprazolam] Other (See Comments) 10/07/2012    Family History  Problem Relation Age of Onset  . Colon cancer Neg Hx   . Colon polyps Sister   . Colon polyps Sister     Social History   Social History  . Marital Status: Married    Spouse Name: N/A  . Number of Children: N/A  . Years of Education: N/A   Social History Main Topics  . Smoking status: Never Smoker   . Smokeless tobacco: Never Used  . Alcohol Use: No  . Drug Use: No  . Sexual Activity: Not Asked   Other Topics Concern  . None   Social History Narrative    Review of Systems: Negative unless mentioned in HPI.   Physical Exam: BP 131/74 mmHg  Pulse 68  Temp(Src) 98.3 F (36.8 C) (Oral)  Ht 6\' 1"  (1.854 m)  Wt 219 lb 12.8 oz (99.701 kg)  BMI 29.01 kg/m2 General:   Alert and oriented. No distress noted. Pleasant and cooperative.  Head:  Normocephalic and atraumatic. Eyes:  Conjuctiva clear without scleral icterus. Mouth:  Oral mucosa pink and moist. Good dentition. No lesions. Heart:  S1, S2 present, irregularly irregular  Abdomen:  +BS, soft, obese. non-tender and non-distended. Difficult to appreciate any HSM due to larger body habitus.  Msk:  Symmetrical without gross deformities. Normal posture. Extremities:  Without edema. Neurologic:  Alert and  oriented x4;  grossly normal neurologically. Psych:  Alert and cooperative. Normal mood and affect.

## 2015-12-28 NOTE — Assessment & Plan Note (Signed)
75 year old male with history of multiple adenomas in 2014, due for surveillance now. He has chronic constipation and seems to be dealing with this more often lately but no other concerning lower GI symptoms. He did well with standard sedation in 2014.   Proceed with TCS with Dr. Gala Romney in near future: the risks, benefits, and alternatives have been discussed with the patient in detail. The patient states understanding and desires to proceed.

## 2015-12-28 NOTE — Assessment & Plan Note (Signed)
Amitiza not working as well lately. Will trial new dosage of Linzess 72 mcg once daily. I have also provided samples of Linzess 145 mcg in case he needs stronger dosage. Colonoscopy as planned.

## 2015-12-31 NOTE — Progress Notes (Signed)
CC'ED TO PCP 

## 2016-01-07 ENCOUNTER — Telehealth: Payer: Self-pay

## 2016-01-07 NOTE — Telephone Encounter (Signed)
PA for TCS is approved by [facility] PA # is [id number]

## 2016-01-07 NOTE — Telephone Encounter (Signed)
Routing to [employer] for [employer]

## 2016-01-07 NOTE — Telephone Encounter (Signed)
Patients wife called and states that the patient is bloated and that he may not be able to do the TCS next week.  States he hasn't been able to have a bowel movement and that the Linzess didn't work. I asked pt if he had only tried the 76mcg and that was all he took.   Advised pt to take the 145 mcg samples that were provided at his visit as Vicente Males had previously recommended.

## 2016-01-07 NOTE — Telephone Encounter (Signed)
Noted  

## 2016-01-07 NOTE — Telephone Encounter (Signed)
Please don't postpone the colonoscopy. Let's try the 145 as you have already told him. Thanks!

## 2016-01-14 ENCOUNTER — Telehealth: Payer: Self-pay

## 2016-01-14 NOTE — Telephone Encounter (Signed)
PT wife called and states that pt just got out of the hosptial and he wont be able to have his procedure done on 04/26. States they will call back and reschedule

## 2016-01-16 ENCOUNTER — Encounter (HOSPITAL_COMMUNITY): Admission: RE | Payer: Self-pay | Source: Ambulatory Visit

## 2016-01-16 ENCOUNTER — Ambulatory Visit (HOSPITAL_COMMUNITY)
Admission: RE | Admit: 2016-01-16 | Payer: Commercial Managed Care - HMO | Source: Ambulatory Visit | Admitting: Internal Medicine

## 2016-01-16 SURGERY — COLONOSCOPY
Anesthesia: Moderate Sedation

## 2016-02-26 ENCOUNTER — Other Ambulatory Visit: Payer: Self-pay

## 2016-02-26 ENCOUNTER — Other Ambulatory Visit: Payer: Self-pay | Admitting: *Deleted

## 2016-02-26 DIAGNOSIS — E118 Type 2 diabetes mellitus with unspecified complications: Secondary | ICD-10-CM

## 2016-02-26 NOTE — Patient Outreach (Signed)
I called to speak to [name] in regards to assistance with his Levemir.  I was able to speak with his wife, who I got verbal consent to speak to.  She stated his Levemir was going to cost $483.  She stated he has 9 pens left which will last him 52 days.  He is currently in the donut hole.  Their granddaughter, Chris [name], is working with them on patient assistance.  I asked her to have Shelly call me to discuss how we can work together.  Since he is not in need of Levemir right now, he does not qualify for the Pharmacy Emergency Fund.  I will continue to follow him and assist with the paper work for assistance.    Deanne Coffer, PharmD, [place] [phone]

## 2016-02-26 NOTE — Patient Outreach (Signed)
[facility]) Care Management  02/26/2016  Chris [name] Nov 22, 1940 [id number]   Referral from MD office:  Telephone call to patient; phone was answered by spouse who stated that patient had hearing difficulty which increased when trying to talk on telephone. Patient was in background and was able to answer some of questions that were relayed to him by wife.  This RN CM could hear some  questions being answered by patient.  Spouse states that major health concern now is that he is in doughnut hole and that insulin  will cost him over $400 til he gets out of doughnut hole.  Spouse states that patient was discharged from [facility] 01/25/16 after being treated for foot wound infection. States home health services were completed yesterday but she is still concerned about his foot. States he does not have fever and there is no drainage from foot wound now. Spouse-caregiver states patient administers his own medication including insulin dosage. States he checks blood sugar daily before breakfast. States he is having low blood sugar levels and adjusts his insulin dosage at night, States blood sugar was 75 this am and she gave him piece of hard candy which was followed with breakfast. . States he was experiencing headache at that time. States last A1c level was 8 and target is 6-7. States patient has fallen 1 time in last 3 months and did not get injured. Currently using cane to get around.  Spouse taking patient to all MD appointments.    Spouse voices that she has spoken with Pharmacist and talked with her about medication situation,  Spouse voices that she would be interested getting visit from community case manager regarding patient's diabetes and low blood sugar levels.   Assessment: Patient recently in hospital for foot infection. Has diabetes and on insulin. Patient adjusting own insulin dosage.  Patient checking blood sugar daily before breakfast. Has had levels of 75 in last  several days.  Patient is falls risk.  Has fallen once in last 3 months. Using cane. Patient has appointment at wound care center in [facility] 03/13/16 Patient has been referred to [facility] pharmacist by care management assistant. Patient consents to community care coordinator services,   Plan: Refer to care management assistant to refer to [facility] care coordinator for complex case management.   Chris Daisy, RN BSN [place] Management Coordinator [facility] Care Management  [phone]

## 2016-02-28 ENCOUNTER — Other Ambulatory Visit: Payer: Self-pay | Admitting: *Deleted

## 2016-02-28 NOTE — Patient Outreach (Signed)
02/28/16- Telephone call to schedule initial home visit, spoke with wife Elijah Birk (permission given by pt), HIPAA verified.  Initial home visit scheduled for 03/07/16.  Jacqlyn Larsen [facility], [place] Coordinator [phone]

## 2016-03-07 ENCOUNTER — Other Ambulatory Visit: Payer: Self-pay | Admitting: *Deleted

## 2016-03-07 ENCOUNTER — Encounter: Payer: Self-pay | Admitting: *Deleted

## 2016-03-07 NOTE — Patient Outreach (Signed)
[facility]) Care Management   03/07/2016  Chris [name] [birth year] [id number]  Chris [name] is an 75 y.o. male  Subjective: Initial home visit with pt, HIPAA verified, wife present and states "he keeps getting these blisters that pop on his leg and I've been using vinegar to clean them"    Wife states home health recently discharged pt and she feels they could benefit from these services again, pt has appointment with wound clinic and wife says difficult for pt to get out and difficult to pay copayments so would rather have home health, wife refuses services of CSW at present due to " we've had that in the past more than once and they can't do anything about these copayments or insulin costs"  Wife states [facility] pharmacist is assisting with solutions for cost of insulin, pt has insulin on hand at present, Pt states he takes insulin "like I want to day to day, I judge what I need"  Objective:   Filed Vitals:   03/07/16 1300  BP: 138/60  Pulse: 94  Resp: 18  Height: 1.854 m (6\' 1" )  Weight: 220 lb (99.791 kg)  SpO2: 96%  CBG fasting ranges 44-363,  Random ranges 127-283 Difficult to read CBG log Unable to obtain averages from glucometer as pt has been using wife's glucometer ROS  Physical Exam  Constitutional: He is oriented to person, place, and time. He appears well-developed and well-nourished.  HENT:  Head: Normocephalic.  Neck: Normal range of motion. Neck supple.  Cardiovascular: Normal rate.   Irregular rhythm  Respiratory: Effort normal and breath sounds normal.  GI: Soft. Bowel sounds are normal.  Musculoskeletal: Normal range of motion.  2+ edema lower extremities bil  Neurological: He is alert and oriented to person, place, and time.  Skin: Skin is warm and dry.  Pt has healed are to left plantar surface of foot and wife is applying bandaid daily, no drainage noted. Pt has skin lesion .75 cm round (blister that opened) to LLE anterior, wife is applying  telfa daily, no drainage noted. Pt has open area (blister that opened) 4 cm round to RLE posterior, wound bed pink with scant pinkish-yellow drainage noted, wife applying telfa daily.  Psychiatric: He has a normal mood and affect. His behavior is normal. Judgment and thought content normal.    Encounter Medications:   Outpatient Encounter Prescriptions as of 03/07/2016  Medication Sig Note  . aspirin EC 81 MG tablet Take 81 mg by mouth daily.   Marland Kitchen. atorvastatin (LIPITOR) 40 MG tablet Take 40 mg by mouth daily at 6 PM.    . Cholecalciferol (VITAMIN D-3) 5000 UNITS TABS Take 5,000 Units by mouth daily.    . furosemide (LASIX) 40 MG tablet Take 1 tablet (40 mg total) by mouth daily as needed for fluid. (Patient taking differently: Take 40 mg by mouth 2 (two) times daily. )   . insulin detemir (LEVEMIR) 100 UNIT/ML injection Inject 50 Units into the skin every morning. 03/07/2016: Pt taking 44 units in am at present  . lactobacillus acidophilus (BACID) TABS tablet Take 1 tablet by mouth daily.   Marland Kitchen. LORazepam (ATIVAN) 0.5 MG tablet Take 0.5 mg by mouth 2 (two) times daily as needed for anxiety.   . metoprolol (LOPRESSOR) 50 MG tablet Take 50 mg by mouth 2 (two) times daily.   . ondansetron (ZOFRAN) 4 MG tablet Take 1 tablet (4 mg total) by mouth every 8 (eight) hours as needed for nausea or  vomiting.   . polyethylene glycol-electrolytes (NULYTELY/GOLYTELY) 420 g solution Take 4,000 mLs by mouth once.   . 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   . ranitidine (ZANTAC) 150 MG tablet Take 150 mg by mouth daily as needed for heartburn. Reported on 03/07/2016    No facility-administered encounter medications on file as of 03/07/2016.    Functional Status:   In your present state of health, do you have any difficulty performing the following activities: 03/07/2016 02/26/2016  Hearing? [name]  Vision?  N N  Difficulty concentrating or making decisions? N N  Walking or climbing stairs? Y Y  Dressing or bathing? Y Y  Doing errands, shopping? Y N  Preparing Food and eating ? Y -  Using the Toilet? N -  In the past six months, have you accidently leaked urine? N -  Do you have problems with loss of bowel control? N -  Managing your Medications? Y -  Managing your Finances? N -  Housekeeping or managing your Housekeeping? Y -    Fall/Depression Screening:    PHQ 2/9 Scores 03/07/2016 02/26/2016 04/03/2015 01/16/2015  PHQ - 2 Score 0 0 0 0   Fall Risk  03/07/2016 02/26/2016 04/03/2015 01/16/2015  Falls in the past year? Yes Yes No No  Number falls in past yr: 1 1 - -  Injury with Fall? No No - -  Risk for fall due to : Impaired balance/gait;History of fall(s);Medication side effect Impaired balance/gait;History of fall(s);Medication side effect Medication side effect;Impaired balance/gait Medication side effect;Impaired balance/gait  Risk for fall due to (comments): - - - pt uses cane  Follow up Education provided;Falls prevention discussed Education provided - -    Assessment:  RN CM ask wife to obtain battery for patient's glucometer so all of his readings can be in one place instead of using wife's glucometer.  RN CM will not involve CSW at present as wife and pt refuse, CSW assisted pt to apply for medicaid in the past and wife discarded the papers citing they did not need medicaid. Pt and wife verbalize understanding of obtaining assistance from social services if needed, RN CM reviewed phone numbers from [facility] calendar for resources in the county. Pt has 24 hour nurse line number on refrigerator.  Wife and pt need reinforcement with all education. RN CM reviewed importance of maintaining skin integrity and following MD orders for wound care, reviewed correlation of infection, elevated CBG and delayed wound healing. RN CM faxed initial home visit to primary MD Dr. Quillian Quince, faxed barrier letter and  reported pt has  open wounds with new area to RLE, requested home health order.  [facility] CM Care Plan Problem One        Most Recent Value   Care Plan Problem One  Knowledge deficit related to diabetes management   Role Documenting the Problem One  Care Management [place] for Problem One  Active   [facility] Long Term Goal (31-90 days)  pt will have reduction in Hgb AIC by 1 point within 90 days  (currently 8.8)   [facility] Long Term Goal Start Date  03/07/16   Interventions for Problem One Long Term Goal  RN CM talked with pt and wife about taking insulin as MD has prescribed (pt takes insulin likes he wants to day to day)   [facility] CM Short Term Goal #1 (  0-30 days)  pt will verbalize actions to take for hypoglycemia within 30 days.   [facility] CM Short Term Goal #1 Start Date  03/07/16   Interventions for Short Term Goal #1  RN CM reviewed signs/ symptoms hypoglycemia and importance of eating 3 meals day plus snacks, instructed pt to let wife know of any symptoms of hypoglycemia, reviewed importance of eating carbohydrates with some protein to treat hypoglycemia.   [facility] CM Short Term Goal #2 (0-30 days)  pt will verbalize plate method within 30 days.   [facility] CM Short Term Goal #2 Start Date  03/07/16   Interventions for Short Term Goal #2  RN CM reviewed plate method and importance of balanced diet, limiting starchy foods, avoiding sweets    [facility] CM Care Plan Problem Two        Most Recent Value   Care Plan Problem Two  Alteration in skin integrity   Role Documenting the Problem Two  Care Management Coordinator   Care Plan for Problem Two  Active   [facility] CM Short Term Goal #1 (0-30 days)  pt will have home health in place or attend appointment at wound clinic within 30 days   [facility] CM Short Term Goal #1 Start Date  03/07/16   Interventions for Short Term Goal #2   RN CM faxed letter to primary MD Dr. Quillian Quince reporting pt has new open area to RLE, requested home health order for RN (as pt has previously had),  reported wife has been applying pure vinegar to skin lesion and needs oversight provided by home health RN,  RN CM ask wife not to use vinegar and wife verbalizes she will use wound cleanser or saline as instructed previously by MD, wife is applying dry dressing daily but could benefit from more oversight or attend wound clinic appointment 03/13/16.      Plan: follow up with home visit 2.5 weeks on 03/27/16 Assess CBG log Assess skin, open areas Follow up on home health/ wound center appointment for 03/13/16  Jacqlyn Larsen [facility], [place] Coordinator [phone]

## 2016-03-10 ENCOUNTER — Other Ambulatory Visit: Payer: Self-pay | Admitting: *Deleted

## 2016-03-10 NOTE — Patient Outreach (Signed)
03/10/16-Patient's wife Iris called RN CM and reports that pt has another new blister on leg and wife states " I need home health to come back, I don't know what to do about all this"  Wife states pt has appointment with wound care center this week 03/13/16.   RN CM called Dr. Arcola Jansky office, spoke with Amy RN and reported pt has new blistered area to his leg, per Amy, she placed order for home health today with [facility] and confirmed pt does have appointment with [place], also states wife can bring pt into MD office if needed. RN CM called wife Iris and informed her of the above information provided by MD office and ask wife to call RN CM if home health does not contact her within 1-2 days, also ask wife to take pt into MD office if needed or infection is suspected or skin integrity worsens.    Jacqlyn Larsen [facility], [place] Coordinator [phone]

## 2016-03-11 ENCOUNTER — Other Ambulatory Visit: Payer: Self-pay | Admitting: *Deleted

## 2016-03-11 NOTE — Patient Outreach (Signed)
03/11/16- Telephone call received from patient's wife Elijah Birk who reports home health RN is seeing pt today through [place].  Jacqlyn Larsen [facility], [place] Coordinator [phone]

## 2016-03-13 ENCOUNTER — Ambulatory Visit: Payer: Self-pay | Admitting: *Deleted

## 2016-03-24 ENCOUNTER — Other Ambulatory Visit: Payer: Self-pay

## 2016-03-24 NOTE — Patient Outreach (Signed)
I called Chris [name] to discuss where she was with the application for Mr. Sherwood's insulins.  She stated she had mailed the application to the company and received a letter where she needed to apply for Extra Help.  If she is denied Extra Help, then the stated she would qualify for assistance.  I told her I would be able to assist her with applying online for it.  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  I stated I could call her on Thursday, March 28, 2016 at 9 am.  She stated that time would work for her.    Deanne Coffer, PharmD, [place] [phone]

## 2016-03-27 ENCOUNTER — Other Ambulatory Visit: Payer: Self-pay

## 2016-03-27 ENCOUNTER — Other Ambulatory Visit: Payer: Self-pay | Admitting: *Deleted

## 2016-03-27 ENCOUNTER — Encounter: Payer: Self-pay | Admitting: *Deleted

## 2016-03-27 NOTE — Patient Outreach (Signed)
I called Mrs. Meegan and verified her husband's PHI.  She is on the consent for to be able to talk to her about his care.  I called Mrs. Yurick to enroll Mr. Mackin into Extra Help.  I was able to complete the application over the phone.  She should hear in the next two to three weeks whether he qualifies or not.  If he does not, then she will send the letter to the pharmaceutical company and he should qualify to get the medication from them.  I will follow up in 3 weeks to determine if she has heard from social security administration on the Extra Help application.   Deanne Coffer, PharmD, [place] [phone]

## 2016-03-27 NOTE — Patient Outreach (Signed)
[facility]) Care Management   03/27/2016  Chris [name] 08-27-1941 [id number]  Chris [name] is an 75 y.o. male  Subjective: Routine home visit with pt, HIPAA verified, wife reports pt went to wound center 03/13/16 and is to follow up 03/31/16, home health is seeing pt twice weekly and applying unna boot to bil lower extremities for control of wounds and edema. Pt states he went to ED yesterday for dehydration and received fluids and saw primary doctor there at hospital, pt states " I feel so much better"  Pt reports "my blood sugars vary day to day, I had low reading this morning"  Objective:   Filed Vitals:   03/27/16 1119  BP: 130/62  Pulse: 79  Resp: 16  Weight: 199 lb (90.266 kg)  SpO2: 98%  CBG ranges fasting 52-442 7 day average 333 14 day average 278  ROS  Physical Exam  Constitutional: He is oriented to person, place, and time. He appears well-developed and well-nourished.  HENT:  Head: Normocephalic.  Neck: Normal range of motion. Neck supple.  Cardiovascular: Normal rate and regular rhythm.   Respiratory: Effort normal.  bil breath sounds diminished all lobes   GI: Soft. Bowel sounds are normal.  Musculoskeletal: Normal range of motion.  bil unna boots intact  Neurological: He is alert and oriented to person, place, and time.  Skin: Skin is warm and dry.  Psychiatric: He has a normal mood and affect. His behavior is normal. Judgment and thought content normal.    Encounter Medications:   Outpatient Encounter Prescriptions as of 03/27/2016  Medication Sig Note  . aspirin EC 81 MG tablet Take 81 mg by mouth daily.   Marland Kitchen atorvastatin (LIPITOR) 40 MG tablet Take 40 mg by mouth daily at 6 PM.    . Cholecalciferol (VITAMIN D-3) 5000 UNITS TABS Take 5,000 Units by mouth daily.    . furosemide (LASIX) 40 MG tablet Take 1 tablet (40 mg total) by mouth daily as needed for fluid. (Patient taking differently: Take 40 mg by mouth 2 (two) times daily. )   .  insulin detemir (LEVEMIR) 100 UNIT/ML injection Inject 50 Units into the skin every morning. 03/07/2016: Pt taking 44 units in am at present  . lactobacillus acidophilus (BACID) TABS tablet Take 1 tablet by mouth daily.   Marland Kitchen LORazepam (ATIVAN) 0.5 MG tablet Take 0.5 mg by mouth 2 (two) times daily as needed for anxiety.   . metoprolol (LOPRESSOR) 50 MG tablet Take 50 mg by mouth 2 (two) times daily.   . ondansetron (ZOFRAN) 4 MG tablet Take 1 tablet (4 mg total) by mouth every 8 (eight) hours as needed for nausea or vomiting.   . polyethylene glycol-electrolytes (NULYTELY/GOLYTELY) 420 g solution Take 4,000 mLs by mouth once.   . 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   . ranitidine (ZANTAC) 150 MG tablet Take 150 mg by mouth daily as needed for heartburn. Reported on 03/07/2016    No facility-administered encounter medications on file as of 03/27/2016.    Functional Status:   In your present state of health, do you have any difficulty performing the following activities: 03/07/2016 02/26/2016  Hearing? [name]  Vision? N N  Difficulty concentrating or making decisions? N N  Walking or climbing stairs? Y Y  Dressing or bathing? Y Y  Doing errands, shopping? Y Dorothy Puffer  and eating ? Y -  Using the Toilet? N -  In the past six months, have you accidently leaked urine? N -  Do you have problems with loss of bowel control? N -  Managing your Medications? Y -  Managing your Finances? N -  Housekeeping or managing your Housekeeping? Y -    Fall/Depression Screening:    PHQ 2/9 Scores 03/07/2016 02/26/2016 04/03/2015 01/16/2015  PHQ - 2 Score 0 0 0 0    Assessment:  RN CM reviewed CBG log, pt is logging CBG in am only, pt had one low reading of 52.  Pt continues to adjust his insulin dosage as he feels like and MD is aware of this.  Pt continues to see MD at wound center.  Pt has lost  fluid weight, legs appear much smaller with unna boot intact.  RN CM reviewed signs/ symptoms dehydration and importance of drinking adequate fluids.  Wife showed papers to RN CM that pt does not qualify for hospice services.  THN CM Care Plan Problem One        Most Recent Value   Care Plan Problem One  Knowledge deficit related to diabetes management   Role Documenting the Problem One  Care Management [place] for Problem One  Active   THN Long Term Goal (31-90 days)  pt will have reduction in Hgb AIC by 1 point within 90 days  (currently 8.8)   THN Long Term Goal Start Date  03/07/16   Interventions for Problem One Long Term Goal  RN CM reiterated insulin dosage and taking medications as prescribed, reviewed importance of eating 3 meals plus snacks   THN CM Short Term Goal #1 (0-30 days)  pt will verbalize actions to take for hypoglycemia within 30 days.   THN CM Short Term Goal #1 Start Date  03/27/16 [goal restarted-needs reinforcement]   Interventions for Short Term Goal #1  RN CM reinforced signs/ symptoms hypoglycemia   THN CM Short Term Goal #2 (0-30 days)  pt will verbalize plate method within 30 days.   THN CM Short Term Goal #2 Start Date  03/27/16 [goal restarted- pt needs reinforcement]   Interventions for Short Term Goal #2  RN CM retaught plate method    THN CM Care Plan Problem Two        Most Recent Value   Care Plan Problem Two  Alteration in skin integrity   Role Documenting the Problem Two  Care Management Coordinator   Care Plan for Problem Two  Active   THN CM Short Term Goal #1 (0-30 days)  pt will have home health in place or attend appointment at wound clinic within 30 days   [facility] CM Short Term Goal #1 Start Date  03/07/16   [facility] CM Short Term Goal #1 Met Date   03/27/16 Barrie Folk met]   Interventions for Short Term Goal #2   RN CM reviewed with pt and wife importance of working with home health RN for better outcomes      Plan:  Follow up with home  visit 04/24/16 Assess CBG and weight Collaborate with home health as needed  Jacqlyn Larsen [facility] [place], [place] [phone]

## 2016-03-28 ENCOUNTER — Emergency Department (HOSPITAL_COMMUNITY): Payer: Commercial Managed Care - HMO

## 2016-03-28 ENCOUNTER — Encounter (HOSPITAL_COMMUNITY): Payer: Self-pay | Admitting: Emergency Medicine

## 2016-03-28 ENCOUNTER — Inpatient Hospital Stay (HOSPITAL_COMMUNITY)
Admission: EM | Admit: 2016-03-28 | Discharge: 2016-03-29 | DRG: 194 | Disposition: A | Discharge status: Home / Self Care | Payer: Commercial Managed Care - HMO | Attending: Internal Medicine | Admitting: Internal Medicine

## 2016-03-28 DIAGNOSIS — R0602 Shortness of breath: Secondary | ICD-10-CM | POA: Diagnosis present

## 2016-03-28 DIAGNOSIS — I5043 Acute on chronic combined systolic (congestive) and diastolic (congestive) heart failure: Secondary | ICD-10-CM

## 2016-03-28 DIAGNOSIS — N179 Acute kidney failure, unspecified: Secondary | ICD-10-CM | POA: Diagnosis present

## 2016-03-28 DIAGNOSIS — Z8673 Personal history of transient ischemic attack (TIA), and cerebral infarction without residual deficits: Secondary | ICD-10-CM | POA: Diagnosis not present

## 2016-03-28 DIAGNOSIS — Y95 Nosocomial condition: Secondary | ICD-10-CM | POA: Diagnosis present

## 2016-03-28 DIAGNOSIS — R7989 Other specified abnormal findings of blood chemistry: Secondary | ICD-10-CM | POA: Diagnosis present

## 2016-03-28 DIAGNOSIS — Z951 Presence of aortocoronary bypass graft: Secondary | ICD-10-CM

## 2016-03-28 DIAGNOSIS — N184 Chronic kidney disease, stage 4 (severe): Secondary | ICD-10-CM | POA: Diagnosis present

## 2016-03-28 DIAGNOSIS — E785 Hyperlipidemia, unspecified: Secondary | ICD-10-CM | POA: Diagnosis present

## 2016-03-28 DIAGNOSIS — E1165 Type 2 diabetes mellitus with hyperglycemia: Secondary | ICD-10-CM | POA: Diagnosis present

## 2016-03-28 DIAGNOSIS — E1122 Type 2 diabetes mellitus with diabetic chronic kidney disease: Secondary | ICD-10-CM | POA: Diagnosis present

## 2016-03-28 DIAGNOSIS — E870 Hyperosmolality and hypernatremia: Secondary | ICD-10-CM | POA: Diagnosis present

## 2016-03-28 DIAGNOSIS — Z9049 Acquired absence of other specified parts of digestive tract: Secondary | ICD-10-CM | POA: Diagnosis not present

## 2016-03-28 DIAGNOSIS — I129 Hypertensive chronic kidney disease with stage 1 through stage 4 chronic kidney disease, or unspecified chronic kidney disease: Secondary | ICD-10-CM | POA: Diagnosis present

## 2016-03-28 DIAGNOSIS — E869 Volume depletion, unspecified: Secondary | ICD-10-CM | POA: Diagnosis present

## 2016-03-28 DIAGNOSIS — J189 Pneumonia, unspecified organism: Secondary | ICD-10-CM | POA: Diagnosis not present

## 2016-03-28 DIAGNOSIS — R778 Other specified abnormalities of plasma proteins: Secondary | ICD-10-CM

## 2016-03-28 DIAGNOSIS — I4891 Unspecified atrial fibrillation: Secondary | ICD-10-CM | POA: Diagnosis present

## 2016-03-28 DIAGNOSIS — Z794 Long term (current) use of insulin: Secondary | ICD-10-CM | POA: Diagnosis not present

## 2016-03-28 DIAGNOSIS — Z7982 Long term (current) use of aspirin: Secondary | ICD-10-CM

## 2016-03-28 DIAGNOSIS — I482 Chronic atrial fibrillation, unspecified: Secondary | ICD-10-CM | POA: Diagnosis present

## 2016-03-28 DIAGNOSIS — R739 Hyperglycemia, unspecified: Secondary | ICD-10-CM

## 2016-03-28 DIAGNOSIS — I252 Old myocardial infarction: Secondary | ICD-10-CM | POA: Diagnosis not present

## 2016-03-28 DIAGNOSIS — I255 Ischemic cardiomyopathy: Secondary | ICD-10-CM | POA: Diagnosis present

## 2016-03-28 DIAGNOSIS — Z66 Do not resuscitate: Secondary | ICD-10-CM | POA: Diagnosis present

## 2016-03-28 DIAGNOSIS — Z79899 Other long term (current) drug therapy: Secondary | ICD-10-CM

## 2016-03-28 LAB — GLUCOSE, CAPILLARY
GLUCOSE-CAPILLARY: 365 mg/dL — AB (ref 65–99)
Glucose-Capillary: 274 mg/dL — ABNORMAL HIGH (ref 65–99)

## 2016-03-28 LAB — CBC WITH DIFFERENTIAL/PLATELET
Basophils Absolute: 0 10*3/uL (ref 0.0–0.1)
Basophils Relative: 0 %
EOS ABS: 0 10*3/uL (ref 0.0–0.7)
EOS PCT: 0 %
HCT: 36.4 % — ABNORMAL LOW (ref 39.0–52.0)
Hemoglobin: 11.7 g/dL — ABNORMAL LOW (ref 13.0–17.0)
LYMPHS ABS: 0.9 10*3/uL (ref 0.7–4.0)
LYMPHS PCT: 12 %
MCH: 34 pg (ref 26.0–34.0)
MCHC: 32.1 g/dL (ref 30.0–36.0)
MCV: 105.8 fL — ABNORMAL HIGH (ref 78.0–100.0)
MONOS PCT: 10 %
Monocytes Absolute: 0.8 10*3/uL (ref 0.1–1.0)
Neutro Abs: 5.9 10*3/uL (ref 1.7–7.7)
Neutrophils Relative %: 78 %
PLATELETS: 186 10*3/uL (ref 150–400)
RBC: 3.44 MIL/uL — ABNORMAL LOW (ref 4.22–5.81)
RDW: 13.4 % (ref 11.5–15.5)
WBC: 7.6 10*3/uL (ref 4.0–10.5)

## 2016-03-28 LAB — BASIC METABOLIC PANEL
ANION GAP: 12 (ref 5–15)
BUN: 83 mg/dL — ABNORMAL HIGH (ref 6–20)
CALCIUM: 8.4 mg/dL — AB (ref 8.9–10.3)
CO2: 32 mmol/L (ref 22–32)
CREATININE: 2.85 mg/dL — AB (ref 0.61–1.24)
Chloride: 87 mmol/L — ABNORMAL LOW (ref 101–111)
GFR, EST AFRICAN AMERICAN: 23 mL/min — AB (ref >60)
GFR, EST NON AFRICAN AMERICAN: 20 mL/min — AB (ref >60)
Glucose, Bld: 511 mg/dL (ref 65–99)
Potassium: 3.9 mmol/L (ref 3.5–5.1)
SODIUM: 131 mmol/L — AB (ref 135–145)

## 2016-03-28 LAB — TROPONIN I
TROPONIN I: 0.1 ng/mL — AB (ref <0.03)
TROPONIN I: 0.1 ng/mL — AB (ref <0.03)

## 2016-03-28 LAB — BRAIN NATRIURETIC PEPTIDE: B Natriuretic Peptide: 397 pg/mL — ABNORMAL HIGH (ref 0.0–100.0)

## 2016-03-28 LAB — PROCALCITONIN: PROCALCITONIN: 0.28 ng/mL

## 2016-03-28 MED ORDER — INSULIN ASPART 100 UNIT/ML ~~LOC~~ SOLN
0.0000 [IU] | Freq: Three times a day (TID) | SUBCUTANEOUS | Status: DC
Start: 2016-03-29 — End: 2016-03-29
  Administered 2016-03-29: 4 [IU] via SUBCUTANEOUS
  Administered 2016-03-29: 11 [IU] via SUBCUTANEOUS

## 2016-03-28 MED ORDER — FAMOTIDINE 20 MG PO TABS
20.0000 mg | ORAL_TABLET | Freq: Every day | ORAL | Status: DC | PRN
  Administered 2016-03-29: 20 mg via ORAL
  Filled 2016-03-28: qty 1

## 2016-03-28 MED ORDER — METOPROLOL TARTRATE 50 MG PO TABS
50.0000 mg | ORAL_TABLET | Freq: Two times a day (BID) | ORAL | Status: DC
  Administered 2016-03-28 – 2016-03-29 (×2): 50 mg via ORAL
  Filled 2016-03-28 (×2): qty 1

## 2016-03-28 MED ORDER — DEXTROSE 5 % IV SOLN
1.0000 g | INTRAVENOUS | Status: DC
  Filled 2016-03-28 (×3): qty 1

## 2016-03-28 MED ORDER — VANCOMYCIN HCL 500 MG IV SOLR
500.0000 mg | Freq: Once | INTRAVENOUS | Status: DC
  Filled 2016-03-28: qty 500

## 2016-03-28 MED ORDER — DEXTROSE 5 % IV SOLN: 1.0000 g | Freq: Three times a day (TID) | INTRAVENOUS | Status: DC

## 2016-03-28 MED ORDER — INSULIN ASPART 100 UNIT/ML ~~LOC~~ SOLN
15.0000 [IU] | Freq: Once | SUBCUTANEOUS | Status: AC
  Administered 2016-03-28: 15 [IU] via INTRAVENOUS
  Filled 2016-03-28: qty 1

## 2016-03-28 MED ORDER — LORAZEPAM 0.5 MG PO TABS: 0.5000 mg | ORAL_TABLET | Freq: Two times a day (BID) | ORAL | Status: DC | PRN

## 2016-03-28 MED ORDER — ATORVASTATIN CALCIUM 40 MG PO TABS: 40.0000 mg | ORAL_TABLET | Freq: Every day | ORAL | Status: DC

## 2016-03-28 MED ORDER — INSULIN DETEMIR 100 UNIT/ML ~~LOC~~ SOLN
50.0000 [IU] | Freq: Every day | SUBCUTANEOUS | Status: DC
  Administered 2016-03-29: 50 [IU] via SUBCUTANEOUS
  Filled 2016-03-28 (×3): qty 0.5

## 2016-03-28 MED ORDER — INSULIN ASPART 100 UNIT/ML ~~LOC~~ SOLN
4.0000 [IU] | Freq: Three times a day (TID) | SUBCUTANEOUS | Status: DC
  Administered 2016-03-29 (×2): 4 [IU] via SUBCUTANEOUS

## 2016-03-28 MED ORDER — SODIUM CHLORIDE 0.9 % IV BOLUS (SEPSIS)
500.0000 mL | Freq: Once | INTRAVENOUS | Status: AC
  Administered 2016-03-28: 500 mL via INTRAVENOUS

## 2016-03-28 MED ORDER — VANCOMYCIN HCL IN DEXTROSE 1-5 GM/200ML-% IV SOLN
1000.0000 mg | Freq: Once | INTRAVENOUS | Status: AC
  Administered 2016-03-28: 1000 mg via INTRAVENOUS
  Filled 2016-03-28: qty 200

## 2016-03-28 MED ORDER — ENOXAPARIN SODIUM 30 MG/0.3ML ~~LOC~~ SOLN
30.0000 mg | SUBCUTANEOUS | Status: DC
  Filled 2016-03-28: qty 0.3

## 2016-03-28 MED ORDER — ASPIRIN EC 81 MG PO TBEC
81.0000 mg | DELAYED_RELEASE_TABLET | Freq: Every day | ORAL | Status: DC
  Administered 2016-03-29: 81 mg via ORAL
  Filled 2016-03-28: qty 1

## 2016-03-28 MED ORDER — ENSURE ENLIVE PO LIQD
237.0000 mL | Freq: Two times a day (BID) | ORAL | Status: DC
  Administered 2016-03-29: 237 mL via ORAL

## 2016-03-28 MED ORDER — PREDNISOLONE ACETATE 1 % OP SUSP
1.0000 [drp] | Freq: Four times a day (QID) | OPHTHALMIC | Status: DC | PRN
  Filled 2016-03-28: qty 1

## 2016-03-28 MED ORDER — VANCOMYCIN HCL IN DEXTROSE 1-5 GM/200ML-% IV SOLN: 1000.0000 mg | INTRAVENOUS | Status: DC

## 2016-03-28 MED ORDER — TRIAMCINOLONE ACETONIDE 0.1 % EX CREA
TOPICAL_CREAM | Freq: Two times a day (BID) | CUTANEOUS | Status: DC
  Administered 2016-03-29: 12:00:00 via TOPICAL
  Filled 2016-03-28: qty 15

## 2016-03-28 MED ORDER — POTASSIUM CHLORIDE CRYS ER 20 MEQ PO TBCR
40.0000 meq | EXTENDED_RELEASE_TABLET | Freq: Two times a day (BID) | ORAL | Status: DC
  Administered 2016-03-28 – 2016-03-29 (×2): 40 meq via ORAL
  Filled 2016-03-28 (×2): qty 2

## 2016-03-28 MED ORDER — PIPERACILLIN-TAZOBACTAM 3.375 G IVPB 30 MIN
3.3750 g | Freq: Once | INTRAVENOUS | Status: AC
Start: 2016-03-28 — End: 2016-03-28
  Administered 2016-03-28: 3.375 g via INTRAVENOUS
  Filled 2016-03-28: qty 50

## 2016-03-28 MED ORDER — ONDANSETRON HCL 4 MG PO TABS: 4.0000 mg | ORAL_TABLET | Freq: Three times a day (TID) | ORAL | Status: DC | PRN

## 2016-03-28 MED ORDER — INSULIN ASPART 100 UNIT/ML ~~LOC~~ SOLN
0.0000 [IU] | Freq: Every day | SUBCUTANEOUS | Status: DC
  Administered 2016-03-28: 3 [IU] via SUBCUTANEOUS

## 2016-03-28 MED ORDER — CEFEPIME HCL 1 G IJ SOLR
INTRAMUSCULAR | Status: AC
  Filled 2016-03-28: qty 1

## 2016-03-28 NOTE — H&P (Signed)
History and Physical  NITESH KRETZER [id number] DOB: 08-22-1941 DOA: 03/28/2016  Referring physician: Dr Dayna Barker, ED physician PCP: Gar Ponto, MD  Outpatient Specialists:   Dr Gala Romney (GI)  Dr Karilyn Cota (Cardiology, [facility])  Chief Complaint: "I feel lousy"  HPI: Chris [name] is a 75 y.o. male with a history of with a history of poorly controlled type 2 diabetes, ischemic cardiomyopathy with EF of 45-50% on echocardiogram 11/2015 (in care everywhere) with hypokinesis of the left ventricle and paradoxical septal motion consistent with right ventricle volume overload, atrial fibrillation with CHADS2 Vasc Score of 7 (not candidate for chronic anticoagulation secondary to chronic epistaxis as well as hemorrhagic conversion of acute CVA), hypertension, stage IV chronic kidney disease.  Patient was recently hospitalized at [facility] secondary to not feeling well. Patient felt globally weak, although is not able to pinpoint any specific complaint. He did have some shortness of breath earlier today, although he has been on increased doses of Lasix and Zaroxolyn and has diuresed about 10 pounds over the past couple of weeks. No palliating or provoking factors. Patient does have some chest pain intermittently with palpitations. He denies fevers, chills, nausea, vomiting, cough, dyspnea on exertion. The patient denies current shortness of breath or chest pain.    Review of Systems:   Pt denies any fevers, chills, nausea, vomiting, diarrhea, constipation, abdominal pain, shortness of breath, dyspnea on exertion, orthopnea, cough, wheezing, palpitations, headache, vision changes, lightheadedness, dizziness, melena, rectal bleeding.  Review of systems are otherwise negative  Past Medical History  Diagnosis Date  . Acute cerebrovascular accident [facility])     presumed embolic from his atrial fibrillation, with hemorrhagic conbersion  . Diabetes mellitus   . Macrocytosis     normal B12 & folate  levels  . Ischemic cardiomyopathy     EF of 45-50% - posterior & inferior walls are severly hypokinetic  . Atrial fibrillation (HCC)     not candidate for chronic anticoagulation with Coumadin given chrronic epistaxis as well as hemorrhagic conversion of his acute cva   . Hyperlipidemia   . Hypertension   . Complication of anesthesia   . PONV (postoperative nausea and vomiting)   . Acute renal insufficiency     resolved  . Myocardial infarct ([place])   . CHF (congestive heart failure) ([place])   . Shortness of breath   . Pleural effusion 10/05/2013  . Tubular adenoma    Past Surgical History  Procedure Laterality Date  . Coronary artery bypass graft    . Tonsillectomy    . Esophagogastroduodenoscopy (egd) with esophageal dilation N/A 10/27/2012    Dr. Gala Romney: distal esophageal diverticulum, non-complete Schatzki's ring s/p dilation, distal esophageal nodule with benign path, negative Barrett's  . Cholecystectomy  2008  . Colonoscopy N/A 12/23/2012    Dr. Gala Romney: multiple rectal and colonic polyps removed, tubular adenomas. needs surveillance April 2017.   . Cardiac surgery    . Video assisted thoracoscopy Right 10/31/2013    Procedure: VIDEO ASSISTED THORACOSCOPY;  Surgeon: Melrose Nakayama, MD;  Location: [place];  Service: Thoracic;  Laterality: Right;  . Pleural effusion drainage Right 10/31/2013    Procedure: DRAINAGE OF PLEURAL EFFUSION;  Surgeon: Melrose Nakayama, MD;  Location: [place];  Service: Thoracic;  Laterality: Right;  . Chest tube insertion Right 10/31/2013    Procedure: INSERTION PLEURAL DRAINAGE CATHETER;  Surgeon: Melrose Nakayama, MD;  Location: [place];  Service: Thoracic;  Laterality: Right;   Social History:  reports  that he has never smoked. He has never used smokeless tobacco. He reports that he does not drink alcohol or use illicit drugs. Patient lives at [place]  . Ciprofloxacin Other (See Comments)    Sores all on legs.   .  Finasteride     Affected  Patients testicles  . Lisinopril Other (See Comments)    Kidney damage    . Metoclopramide     Jerking all over  . Protonix [Pantoprazole Sodium]     abd pain  . Xanax [Alprazolam] Other (See Comments)    Not in right state of mind.     Family History  Problem Relation Age of Onset  . Colon cancer Neg Hx   . Colon polyps Sister   . Colon polyps Sister      Prior to Admission medications   Medication Sig Start Date End Date Taking? Authorizing Provider  alum & mag hydroxide-simeth (MAALOX/MYLANTA) 200-200-20 MG/5ML suspension Take 30 mLs by mouth 2 (two) times daily.   Yes Historical Provider, MD  aspirin EC 81 MG tablet Take 81 mg by mouth daily.   Yes Historical Provider, MD  atorvastatin (LIPITOR) 40 MG tablet Take 40 mg by mouth daily at 6 PM.  01/07/14  Yes Historical Provider, MD  Cholecalciferol (VITAMIN D-3) 5000 UNITS TABS Take 5,000 Units by mouth daily.    Yes Historical Provider, MD  furosemide (LASIX) 40 MG tablet Take 1 tablet (40 mg total) by mouth daily as needed for fluid. Patient taking differently: Take 40 mg by mouth 2 (two) times daily.  01/11/15  Yes Maryann Mikhail, DO  insulin detemir (LEVEMIR) 100 UNIT/ML injection Inject 40-50 Units into the skin every morning.    Yes Historical Provider, MD  LORazepam (ATIVAN) 0.5 MG tablet Take 0.5 mg by mouth 2 (two) times daily as needed for anxiety.   Yes Historical Provider, MD  metolazone (ZAROXOLYN) 5 MG tablet TAKE 1 TABLET BY MOUTH DAILY AS NEEDED FOR SWELLING 03/17/16  Yes Historical Provider, MD  metoprolol (LOPRESSOR) 50 MG tablet Take 50 mg by mouth 2 (two) times daily.   Yes Historical Provider, MD  Multiple Vitamin (MULTIVITAMIN WITH MINERALS) TABS tablet Take 1 tablet by mouth daily.   Yes Historical Provider, MD  ondansetron (ZOFRAN) 4 MG tablet Take 1 tablet (4 mg total) by mouth every 8 (eight) hours as needed for nausea or vomiting. 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   Yes Historical Provider, MD  ranitidine (ZANTAC) 150 MG tablet Take 150 mg by mouth daily as needed for heartburn. Reported on 03/07/2016   Yes Historical Provider, MD  triamcinolone cream (KENALOG) 0.1 % APPLY TO AFFECTED AREA DAILY AS DIRECTED 03/13/16  Yes Historical Provider, MD  polyethylene glycol-electrolytes (NULYTELY/GOLYTELY) 420 g solution Take 4,000 mLs by mouth once. Patient not taking: Reported on 03/28/2016 12/27/15   Orvil Feil, NP    Physical Exam: BP 141/73 mmHg  Pulse 99  Temp(Src) 97.8 F (36.6 C) (Temporal)  Resp 18  Ht 6\' 1"  (1.854 m)  Wt 90.719 kg (200 lb)  BMI 26.39 kg/m2  SpO2 99%  General: Elderly Caucasian male. Awake and alert and oriented x3. No acute cardiopulmonary distress.  HEENT: Normocephalic atraumatic.  Right and left ears normal in appearance.  Pupils equal, round, reactive to light. Extraocular muscles  are intact. Sclerae anicteric and noninjected.  Moist mucosal membranes. No mucosal lesions.  Neck: Neck supple without lymphadenopathy. No carotid bruits. No masses palpated.  Cardiovascular: Regular rate with normal S1-S2 sounds. No murmurs, rubs, gallops auscultated. No JVD.  Respiratory: Diminished breath sounds, rales in the right lung base. No wheezing. Patient is able to talk with full sentences and without accessory muscle use.  No accessory muscle use. Abdomen: Soft, nontender, nondistended. Active bowel sounds. No masses or hepatosplenomegaly  Skin: No rashes, lesions, or ulcerations.  Dry, warm to touch. 2+ dorsalis pedis and radial pulses. Musculoskeletal: No calf or leg pain. All major joints not erythematous nontender.  No upper or lower joint deformation.  Good ROM.  No contractures  Psychiatric: Intact judgment and insight. Pleasant and cooperative. Neurologic: No focal neurological deficits. Strength  is 5/5 and symmetric in upper and lower extremities.  Cranial nerves II through XII are grossly intact.           Labs on Admission: I have personally reviewed following labs and imaging studies  CBC:  Recent Labs Lab 03/28/16 1531  WBC 7.6  NEUTROABS 5.9  HGB 11.7*  HCT 36.4*  MCV 105.8*  PLT 99991111   Basic Metabolic Panel:  Recent Labs Lab 03/28/16 1531  NA 131*  K 3.9  CL 87*  CO2 32  GLUCOSE 511*  BUN 83*  CREATININE 2.85*  CALCIUM 8.4*   GFR: Estimated Creatinine Clearance: 24.9 mL/min (by C-G formula based on Cr of 2.85). Liver Function Tests: No results for input(s): AST, ALT, ALKPHOS, BILITOT, PROT, ALBUMIN in the last 168 hours. No results for input(s): LIPASE, AMYLASE in the last 168 hours. No results for input(s): AMMONIA in the last 168 hours. Coagulation Profile: No results for input(s): INR, PROTIME in the last 168 hours. Cardiac Enzymes:  Recent Labs Lab 03/28/16 1531  TROPONINI 0.10*   BNP (last 3 results) No results for input(s): PROBNP in the last 8760 hours. HbA1C: No results for input(s): HGBA1C in the last 72 hours. CBG: No results for input(s): GLUCAP in the last 168 hours. Lipid Profile: No results for input(s): CHOL, HDL, LDLCALC, TRIG, CHOLHDL, LDLDIRECT in the last 72 hours. Thyroid Function Tests: No results for input(s): TSH, T4TOTAL, FREET4, T3FREE, THYROIDAB in the last 72 hours. Anemia Panel: No results for input(s): VITAMINB12, FOLATE, FERRITIN, TIBC, IRON, RETICCTPCT in the last 72 hours. Urine analysis:    Component Value Date/Time   COLORURINE YELLOW 12/13/2015 1125   APPEARANCEUR CLEAR 12/13/2015 1125   LABSPEC 1.015 12/13/2015 1125   PHURINE 6.5 12/13/2015 1125   GLUCOSEU NEGATIVE 12/13/2015 1125   HGBUR NEGATIVE 12/13/2015 1125   BILIRUBINUR NEGATIVE 12/13/2015 1125   KETONESUR NEGATIVE 12/13/2015 1125   PROTEINUR NEGATIVE 12/13/2015 1125   UROBILINOGEN 0.2 10/27/2013 1136   NITRITE NEGATIVE 12/13/2015 1125    LEUKOCYTESUR NEGATIVE 12/13/2015 1125   Sepsis Labs: @LABRCNTIP (procalcitonin:4,lacticidven:4) )No results found for this or any previous visit (from the past 240 hour(s)).   Radiological Exams on Admission: Dg Chest 2 View  03/28/2016  CLINICAL DATA:  Shortness of Breath EXAM: CHEST  2 VIEW COMPARISON:  03/25/2016 FINDINGS: Cardiac shadow is enlarged. Postsurgical changes are noted. Left lower lobe infiltrate with associated effusion is seen. No bony abnormality is noted. IMPRESSION: Left lower lobe infiltrate with associated effusion. Electronically Signed   By: Inez Catalina M.D.   On: 03/28/2016 15:56    EKG: Independently reviewed. Atrial fibrillation with normal ventricular rate. 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 No acute ST elevation or depression. This is unchanged from previous  Assessment/Plan: Principal Problem:   Healthcare-associated pneumonia Active Problems:   A-fib ([place])   DM (diabetes mellitus) ([place])   Acute renal failure superimposed on stage 4 chronic kidney disease (HCC)   Elevated troponin I level   Hyperglycemia    This patient was discussed with the ED physician, including pertinent vitals, physical exam findings, labs, and imaging.  We also discussed care given by the ED provider.  #1 healthcare associated pneumonia  Admit  Blood cultures  Vancomycin and cefepime  We'll obtain pro-calcitonin to distinguish bacterial infection  Strep Antigen by urine  #2 elevated troponin I level  Telemetry monitoring  Serial troponins #3 hyperglycemia  We'll give 15 units of insulin in the emergency department  We'll replace potassium  We'll aggressively manage with sliding scale insulin and home Levemir #4 acute on chronic kidney disease  Cautiously rehydrate given patient's LVEF  Hold diuretics  Recheck creatinine in the morning #5 atrial fibrillation  Currently controlled #6 diabetes  Hemoglobin A1c  Sliding scale  insulin  DVT prophylaxis: Lovenox Consultants: None Code Status: DO NOT RESUSCITATE Family Communication: Wife Disposition Plan: Likely discharge to home   Truett Mainland, DO [facility] Pager [phone]  If 7PM-7AM, please contact night-coverage [email or web address] Password [email or web address]

## 2016-03-28 NOTE — ED Notes (Signed)
CRITICAL VALUE ALERT  Critical value received:  Glucose 511 Troponin 0.10 Date of notification:  03/28/16  Time of notification:  1638  Critical value read back:Yes.    Nurse who received alert:  JRM  MD notified (1st page):  Mesner  Time of first page:  1638  MD notified (2nd page):  Time of second page:  Responding MD:  Mesner  Time MD responded:  [id number]

## 2016-03-28 NOTE — ED Notes (Signed)
Pt was admitted to [facility] and discharge, pt was sitting on porch and felt heart racing and SOB

## 2016-03-28 NOTE — Progress Notes (Signed)
Pharmacy Antibiotic Note  Chris [name] is a 75 y.o. male admitted on 03/28/2016 with pneumonia.  Pharmacy has been consulted for Vancomycin and Renal dose adjustment Ceffepime dosing.  Plan: Vancomycin 1 GM given in ED Vancomycin 500 mg IV additional dose, then Vancomycin 1 GM IV every 24 hours Cefepime 1 GM IV every 24 hours Vancomycin trough at steady state Labs per protocol  Height: 6\' 1"  (185.4 cm) Weight: 203 lb 1 oz (92.109 kg) IBW/kg (Calculated) : 79.9  Temp (24hrs), Avg:97.9 F (36.6 C), Min:97.8 F (36.6 C), Max:98 F (36.7 C)   Recent Labs Lab 03/28/16 1531  WBC 7.6  CREATININE 2.85*    Estimated Creatinine Clearance: 24.9 mL/min (by C-G formula based on Cr of 2.85).    Allergies  Allergen Reactions  . Ciprofloxacin Other (See Comments)    Sores all on legs.   . Finasteride     Affected  Patients testicles  . Lisinopril Other (See Comments)    Kidney damage    . Metoclopramide     Jerking all over  . Protonix [Pantoprazole Sodium]     abd pain  . Xanax [Alprazolam] Other (See Comments)    Not in right state of mind.     Antimicrobials this admission: Zosyn 7/7 > 7/7 one dose in ED Vancomycin  7/7 >>  Cefepime  7/7 >>   Dose adjustments this admission: None  Thank you for allowing pharmacy to be a part of this patient's care.  Chriss Czar 03/28/2016 6:48 PM

## 2016-03-28 NOTE — ED Provider Notes (Addendum)
CSN: [id number]     Arrival date & time 03/28/16  1404 History   First MD Initiated Contact with Patient 03/28/16 1503     Chief Complaint  Patient presents with  . Shortness of Breath     (Consider location/radiation/quality/duration/timing/severity/associated sxs/prior Treatment) HPI Comments: Here with multiple complaints and poor historian. Unclear on what actually brought him into ER. Seems like he had some palpitations earlier today with dyspnea that concerned them. This is not new and he has a h/o Afib, on metoprolol. He also had chest pain en route. Also has h/o of pleural effusion and ischemic cardiomyopathy with ef of 45-50 on lasix. Also complains of increasing blood sugars.    Past Medical History  Diagnosis Date  . Acute cerebrovascular accident [facility])     presumed embolic from his atrial fibrillation, with hemorrhagic conbersion  . Diabetes mellitus   . Macrocytosis     normal B12 & folate levels  . Ischemic cardiomyopathy     EF of 45-50% - posterior & inferior walls are severly hypokinetic  . Atrial fibrillation (HCC)     not candidate for chronic anticoagulation with Coumadin given chrronic epistaxis as well as hemorrhagic conversion of his acute cva   . Hyperlipidemia   . Hypertension   . Complication of anesthesia   . PONV (postoperative nausea and vomiting)   . Acute renal insufficiency     resolved  . Myocardial infarct ([place])   . CHF (congestive heart failure) ([place])   . Shortness of breath   . Pleural effusion 10/05/2013  . Tubular adenoma    Past Surgical History  Procedure Laterality Date  . Coronary artery bypass graft    . Tonsillectomy    . Esophagogastroduodenoscopy (egd) with esophageal dilation N/A 10/27/2012    Dr. Gala Romney: distal esophageal diverticulum, non-complete Schatzki's ring s/p dilation, distal esophageal nodule with benign path, negative Barrett's  . Cholecystectomy  2008  . Colonoscopy N/A 12/23/2012    Dr. Gala Romney: multiple rectal and  colonic polyps removed, tubular adenomas. needs surveillance April 2017.   . Cardiac surgery    . Video assisted thoracoscopy Right 10/31/2013    Procedure: VIDEO ASSISTED THORACOSCOPY;  Surgeon: Melrose Nakayama, MD;  Location: [place];  Service: Thoracic;  Laterality: Right;  . Pleural effusion drainage Right 10/31/2013    Procedure: DRAINAGE OF PLEURAL EFFUSION;  Surgeon: Melrose Nakayama, MD;  Location: [place];  Service: Thoracic;  Laterality: Right;  . Chest tube insertion Right 10/31/2013    Procedure: INSERTION PLEURAL DRAINAGE CATHETER;  Surgeon: Melrose Nakayama, MD;  Location: [place];  Service: Thoracic;  Laterality: Right;   Family History  Problem Relation Age of Onset  . Colon cancer Neg Hx   . Colon polyps Sister   . Colon polyps Sister    Social History  Substance Use Topics  . Smoking status: Never Smoker   . Smokeless tobacco: Never Used  . Alcohol Use: No    Review of Systems  Constitutional: Positive for activity change and fatigue. Negative for diaphoresis and unexpected weight change.  HENT: Negative for congestion.   Eyes: Negative for pain.  Respiratory: Positive for shortness of breath.   Cardiovascular: Positive for chest pain, palpitations and leg swelling.  Gastrointestinal: Positive for abdominal pain and abdominal distention. Negative for nausea.  Endocrine: Negative for polyuria.  Genitourinary: Negative for flank pain and enuresis.  Musculoskeletal: Negative for back pain and neck pain.  Skin: Negative for pallor and wound.  Neurological: Negative  for numbness.      Allergies  Ciprofloxacin; Finasteride; Lisinopril; Metoclopramide; Protonix; and Xanax  Home Medications   Prior to Admission medications   Medication Sig Start Date End Date Taking? Authorizing Provider  aspirin EC 81 MG tablet Take 81 mg by mouth daily.    Historical Provider, MD  atorvastatin (LIPITOR) 40 MG tablet Take 40 mg by mouth daily at 6 PM.  01/07/14   Historical  Provider, MD  Cholecalciferol (VITAMIN D-3) 5000 UNITS TABS Take 5,000 Units by mouth daily.     Historical Provider, MD  furosemide (LASIX) 40 MG tablet Take 1 tablet (40 mg total) by mouth daily as needed for fluid. Patient taking differently: Take 40 mg by mouth 2 (two) times daily.  01/11/15   Maryann Mikhail, DO  insulin detemir (LEVEMIR) 100 UNIT/ML injection Inject 50 Units into the skin every morning.    Historical Provider, MD  lactobacillus acidophilus (BACID) TABS tablet Take 1 tablet by mouth daily.    Historical Provider, MD  LORazepam (ATIVAN) 0.5 MG tablet Take 0.5 mg by mouth 2 (two) times daily as needed for anxiety.    Historical Provider, MD  metoprolol (LOPRESSOR) 50 MG tablet Take 50 mg by mouth 2 (two) times daily.    Historical Provider, MD  ondansetron (ZOFRAN) 4 MG tablet Take 1 tablet (4 mg total) by mouth every 8 (eight) hours as needed for nausea or vomiting. 12/27/15   Orvil Feil, NP  polyethylene glycol-electrolytes (NULYTELY/GOLYTELY) 420 g solution Take 4,000 mLs by mouth once. 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    Historical Provider, MD  ranitidine (ZANTAC) 150 MG tablet Take 150 mg by mouth daily as needed for heartburn. Reported on 03/07/2016    Historical Provider, MD   BP 140/67 mmHg  Pulse 101  Temp(Src) 97.8 F (36.6 C) (Temporal)  Resp 20  Ht 6\' 1"  (1.854 m)  Wt 200 lb (90.719 kg)  BMI 26.39 kg/m2  SpO2 97% Physical Exam  Constitutional: He is oriented to person, place, and time. He appears well-developed and well-nourished.  HENT:  Head: Normocephalic and atraumatic.  Neck: Normal range of motion.  Cardiovascular: Normal rate.  An irregularly irregular rhythm present.  Pulmonary/Chest: Effort normal. No respiratory distress. He has no wheezes. He has rales.  Abdominal: Soft. He exhibits no distension.   Musculoskeletal: Normal range of motion.  Neurological: He is alert and oriented to person, place, and time.  Skin: Skin is warm and dry.  Nursing note and vitals reviewed.   ED Course  Procedures (including critical care time) Labs Review Labs Reviewed - No data to display  Imaging Review No results found. I have personally reviewed and evaluated these images and lab results as part of my medical decision-making.   EKG Interpretation None      MDM   Final diagnoses:  None    75 year old male found with hyperglycemia, chest pain needing an ACS rule out. Also with acute kidney injury as well. Patient will likely need fluids which will get in the way of his CHF but also need some insulin adjustment and delta troponins. Secondary to the multiple issues I discussed case with the hospitalist who will admit. I also discussed with cardiology office and got him an appointment to follow-up with Dr. Bronson Ing on July 20 at 140.   Corene Cornea  Isair Inabinet, MD 03/28/16 2349

## 2016-03-29 LAB — GLUCOSE, CAPILLARY
GLUCOSE-CAPILLARY: 200 mg/dL — AB (ref 65–99)
GLUCOSE-CAPILLARY: 256 mg/dL — AB (ref 65–99)
Glucose-Capillary: 173 mg/dL — ABNORMAL HIGH (ref 65–99)

## 2016-03-29 LAB — HEMOGLOBIN A1C
HEMOGLOBIN A1C: 8.8 % — AB (ref 4.8–5.6)
Mean Plasma Glucose: 206 mg/dL

## 2016-03-29 LAB — TROPONIN I
TROPONIN I: 0.12 ng/mL — AB (ref <0.03)
Troponin I: 0.12 ng/mL (ref <0.03)

## 2016-03-29 LAB — CBC
HEMATOCRIT: 36.2 % — AB (ref 39.0–52.0)
HEMOGLOBIN: 11.8 g/dL — AB (ref 13.0–17.0)
MCH: 34.2 pg — ABNORMAL HIGH (ref 26.0–34.0)
MCHC: 32.6 g/dL (ref 30.0–36.0)
MCV: 104.9 fL — ABNORMAL HIGH (ref 78.0–100.0)
Platelets: 178 10*3/uL (ref 150–400)
RBC: 3.45 MIL/uL — AB (ref 4.22–5.81)
RDW: 13.5 % (ref 11.5–15.5)
WBC: 8.7 10*3/uL (ref 4.0–10.5)

## 2016-03-29 LAB — BASIC METABOLIC PANEL
ANION GAP: 9 (ref 5–15)
BUN: 72 mg/dL — AB (ref 6–20)
CO2: 33 mmol/L — ABNORMAL HIGH (ref 22–32)
Calcium: 8.5 mg/dL — ABNORMAL LOW (ref 8.9–10.3)
Chloride: 94 mmol/L — ABNORMAL LOW (ref 101–111)
Creatinine, Ser: 2.7 mg/dL — ABNORMAL HIGH (ref 0.61–1.24)
GFR, EST AFRICAN AMERICAN: 25 mL/min — AB (ref >60)
GFR, EST NON AFRICAN AMERICAN: 21 mL/min — AB (ref >60)
Glucose, Bld: 171 mg/dL — ABNORMAL HIGH (ref 65–99)
POTASSIUM: 3.6 mmol/L (ref 3.5–5.1)
SODIUM: 136 mmol/L (ref 135–145)

## 2016-03-29 LAB — STREP PNEUMONIAE URINARY ANTIGEN: STREP PNEUMO URINARY ANTIGEN: NEGATIVE

## 2016-03-29 MED ORDER — TRIAMCINOLONE ACETONIDE 0.1 % EX CREA: TOPICAL_CREAM | CUTANEOUS | Status: AC

## 2016-03-29 MED ORDER — CEFUROXIME AXETIL 250 MG PO TABS: 500.0000 mg | ORAL_TABLET | Freq: Two times a day (BID) | ORAL | Status: DC

## 2016-03-29 NOTE — Progress Notes (Signed)
1445 d/c paperwork, instructions and hard Rx given to patient and patient's wife. IV catheter removed from RIGHT FA, intact w/no s/s of infection noted. Telemetry wiring and monitor removed from patient and central telemetry notified and made aware. Patient assisted to vehicle via w/c by staff at this time with belongings.

## 2016-03-29 NOTE — Progress Notes (Signed)
Pt discharged in stable condtion via wheelchair into the care of his wife via private vehicle.  Discharge instructions reviewed with pt/wife.  Pt/wife verbalized understanding.

## 2016-03-29 NOTE — Discharge Summary (Signed)
Physician Discharge Summary  Chris [name] [id number] DOB: 21-Sep-1941 DOA: 03/28/2016  PCP: Gar Ponto, MD  Admit date: 03/28/2016 Discharge date: 03/29/2016  Time spent: 35 minutes  Recommendations for Outpatient Follow-up:  1. Follow up with PCP next week.  2.         Follow up with Cardiology and GI as scheduled.    Discharge Diagnoses:  Principal Problem:   Healthcare-associated pneumonia Active Problems:   A-fib ([place])   DM (diabetes mellitus) ([place])   Acute renal failure superimposed on stage 4 chronic kidney disease (HCC)   Elevated troponin I level   Hyperglycemia   Discharge Condition: Improved.  No SOB, no coughs.  Feeling better.   Diet recommendation: Carb modified diet.   Filed Weights   03/28/16 1409 03/28/16 1822  Weight: 90.719 kg (200 lb) 92.109 kg (203 lb 1 oz)    History of present illness: patient was admitted by Dr Nehemiah Settle for hyperosmolar, volume depletion, and possibly HCAP on March 28, 2016.  As per his H and P:  " Chris [name] is a 75 y.o. male with a history of with a history of poorly controlled type 2 diabetes, ischemic cardiomyopathy with EF of 45-50% on echocardiogram 11/2015 (in care everywhere) with hypokinesis of the left ventricle and paradoxical septal motion consistent with right ventricle volume overload, atrial fibrillation with CHADS2 Vasc Score of 7 (not candidate for chronic anticoagulation secondary to chronic epistaxis as well as hemorrhagic conversion of acute CVA), hypertension, stage IV chronic kidney disease. Patient was recently hospitalized at [facility] secondary to not feeling well. Patient felt globally weak, although is not able to pinpoint any specific complaint. He did have some shortness of breath earlier today, although he has been on increased doses of Lasix and Zaroxolyn and has diuresed about 10 pounds over the past couple of weeks. No palliating or provoking factors. Patient does have some chest pain intermittently  with palpitations. He denies fevers, chills, nausea, vomiting, cough, dyspnea on exertion. The patient denies current shortness of breath or chest pain.    Hospital Course: patient was given IVF and IV Van/Cefepime.  His CXR showed LLL infiltrate and an effusion, but he really had no significant pulmonary symptoms.  He has no coughs, SOB, or fever.  For his hyperglycemia, he was likely volume depleted, and he was slightly hyperosmolar.  He was given IVF, and felt significantly better today.  He wanted to go home, and his wife felt that he was now at baseline.  He will be discharged home on no diuretics, and will be given Cefuroxime 500mg  BID for another week.  I am not totally convinced he has HCAP, but given CXR appearance, we will finish tx course.  He will see his PCP and his cardiology in one week.  Thank you and Good Day.     Discharge Exam: Filed Vitals:   03/28/16 2039 03/29/16 0648  BP: 148/68 137/52  Pulse: 87 89  Temp: 98.7 F (37.1 C) 98.4 F (36.9 C)  Resp: 18 20   Discharge Instructions: Finish your antibiotic course as instructed.  Follow up with your cardiology and your PCP next week.    Discharge Instructions    Diet - low sodium heart healthy    Complete by:  As directed      Increase activity slowly    Complete by:  As directed           Current Discharge Medication List    START taking these  medications   Details  cefUROXime (CEFTIN) 250 MG tablet Take 2 tablets (500 mg total) by mouth 2 (two) times daily with a meal. Qty: 20 tablet, Refills: 0      CONTINUE these medications which have CHANGED   Details  triamcinolone cream (KENALOG) 0.1 % APPLY TO AFFECTED AREA DAILY AS DIRECTED Qty: 30 g, Refills: 0      CONTINUE these medications which have NOT CHANGED   Details  alum & mag hydroxide-simeth (MAALOX/MYLANTA) 200-200-20 MG/5ML suspension Take 30 mLs by mouth 2 (two) times daily.    aspirin EC 81 MG tablet Take 81 mg by mouth daily.    atorvastatin  (LIPITOR) 40 MG tablet Take 40 mg by mouth daily at 6 PM.     Cholecalciferol (VITAMIN D-3) 5000 UNITS TABS Take 5,000 Units by mouth daily.     insulin detemir (LEVEMIR) 100 UNIT/ML injection Inject 40-50 Units into the skin every morning.     LORazepam (ATIVAN) 0.5 MG tablet Take 0.5 mg by mouth 2 (two) times daily as needed for anxiety.    metoprolol (LOPRESSOR) 50 MG tablet Take 50 mg by mouth 2 (two) times daily.    Multiple Vitamin (MULTIVITAMIN WITH MINERALS) TABS tablet Take 1 tablet by mouth daily.    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    ranitidine (ZANTAC) 150 MG tablet Take 150 mg by mouth daily as needed for heartburn. Reported on 03/07/2016      STOP taking these medications     furosemide (LASIX) 40 MG tablet      metolazone (ZAROXOLYN) 5 MG tablet      ondansetron (ZOFRAN) 4 MG tablet      polyethylene glycol-electrolytes (NULYTELY/GOLYTELY) 420 g solution        Allergies  Allergen Reactions  . Ciprofloxacin Other (See Comments)    Sores all on legs.   . Finasteride     Affected  Patients testicles  . Lisinopril Other (See Comments)    Kidney damage    . Metoclopramide     Jerking all over  . Protonix [Pantoprazole Sodium]     abd pain  . Xanax [Alprazolam] Other (See Comments)    Not in right state of mind.    Follow-up Information    Follow up with Kate Sable, MD On 04/10/2016.   Specialty:  Cardiology   Why:  at 140 in the afternoon   Contact information:   [address] [place] [address] [phone]        The results of significant diagnostics from this hospitalization (including imaging, microbiology, ancillary and laboratory) are listed below for reference.    Significant Diagnostic Studies: Dg Chest 2 View  03/28/2016  CLINICAL DATA:  Shortness of Breath EXAM: CHEST  2 VIEW COMPARISON:  03/25/2016  FINDINGS: Cardiac shadow is enlarged. Postsurgical changes are noted. Left lower lobe infiltrate with associated effusion is seen. No bony abnormality is noted. IMPRESSION: Left lower lobe infiltrate with associated effusion. Electronically Signed   By: Inez Catalina M.D.   On: 03/28/2016 15:56    Microbiology: Recent Results (from the past 240 hour(s))  Culture, blood (routine x 2) Call MD if unable to obtain prior to antibiotics being given     Status: None (Preliminary result)   Collection Time: 03/28/16  6:45 PM  Result Value Ref Range Status   Specimen Description BLOOD  LEFT ARM  Final   Special Requests BOTTLES DRAWN AEROBIC AND ANAEROBIC [place]  Final   Culture NO GROWTH < 12 HOURS  Final   Report Status PENDING  Incomplete  Culture, blood (routine x 2) Call MD if unable to obtain prior to antibiotics being given     Status: None (Preliminary result)   Collection Time: 03/28/16  6:48 PM  Result Value Ref Range Status   Specimen Description BLOOD LEFT ARM  Final   Special Requests BOTTLES DRAWN AEROBIC AND ANAEROBIC 8CC EACH  Final   Culture NO GROWTH < 12 HOURS  Final   Report Status PENDING  Incomplete     Labs: Basic Metabolic Panel:  Recent Labs Lab 03/28/16 1531 03/29/16 0559  NA 131* 136  K 3.9 3.6  CL 87* 94*  CO2 32 33*  GLUCOSE 511* 171*  BUN 83* 72*  CREATININE 2.85* 2.70*  CALCIUM 8.4* 8.5*   CBC:  Recent Labs Lab 03/28/16 1531 03/29/16 0559  WBC 7.6 8.7  NEUTROABS 5.9  --   HGB 11.7* 11.8*  HCT 36.4* 36.2*  MCV 105.8* 104.9*  PLT 186 178   Cardiac Enzymes:  Recent Labs Lab 03/28/16 1531 03/28/16 1845 03/28/16 2345 03/29/16 0559  TROPONINI 0.10* 0.10* 0.12* 0.12*   BNP: BNP (last 3 results)  Recent Labs  12/13/15 1117 03/28/16 1532  BNP 281.0* 397.0*   CBG:  Recent Labs Lab 03/28/16 1838 03/28/16 2054 03/29/16 0327 03/29/16 0738 03/29/16 1137  GLUCAP 365* 274* 173* 200* 256*    SignedOrvan Falconer MD. Rosalita Chessman.  [facility] 03/29/2016, 2:21 PM

## 2016-03-29 NOTE — Progress Notes (Signed)
Nutrition Brief Note  Patient identified on the Malnutrition Screening Tool (MST) Report  Wt Readings from Last 15 Encounters:  03/28/16 203 lb 1 oz (92.109 kg)  03/27/16 199 lb (90.266 kg)  03/07/16 220 lb (99.791 kg)  12/27/15 219 lb 12.8 oz (99.701 kg)  12/13/15 218 lb (98.884 kg)  05/08/15 207 lb (93.895 kg)  04/03/15 205 lb (92.987 kg)  03/06/15 209 lb 9.6 oz (95.074 kg)  02/06/15 211 lb (95.709 kg)  01/16/15 213 lb 12.8 oz (96.979 kg)  01/11/15 212 lb 12.8 oz (96.525 kg)  01/10/14 212 lb (96.163 kg)  12/13/13 214 lb (97.07 kg)  12/01/13 207 lb 9.6 oz (94.167 kg)  11/22/13 217 lb (98.431 kg)   Chris [name] is a 75 y.o. male with a history of with a history of poorly controlled type 2 diabetes, ischemic cardiomyopathy with EF of 45-50% on echocardiogram 11/2015 (in care everywhere) with hypokinesis of the left ventricle and paradoxical septal motion consistent with right ventricle volume overload, atrial fibrillation with CHADS2 Vasc Score of 7 (not candidate for chronic anticoagulation secondary to chronic epistaxis as well as hemorrhagic conversion of acute CVA), hypertension, stage IV chronic kidney disease. Patient was recently hospitalized at [facility] secondary to not feeling well. Patient felt globally weak, although is not able to pinpoint any specific complaint. He did have some shortness of breath earlier today, although he has been on increased doses of Lasix and Zaroxolyn and has diuresed about 10 pounds over the past couple of weeks. No palliating or provoking factors. Patient does have some chest pain intermittently with palpitations. He denies fevers, chills, nausea, vomiting, cough, dyspnea on exertion. The patient denies current shortness of breath or chest pain.   Chart reviewed. UBW ranges from 203-217#. Per MD notes, pt lost 10# recently secondary to diuresis.  Last Hgb A1c: 8.8. Per chart review, pt is prescribed insulin (DM home regimen Levemir 40-50 units  q AM). However, pt has been unable to get insulin; care management consult pending.   Body mass index is 26.8 kg/(m^2). Patient meets criteria for overweight based on current BMI.   Current diet order is Heart Healthy/ Carb Modified, patient is consuming approximately n/a% of meals at this time. Labs and medications reviewed.   No nutrition interventions warranted at this time. If nutrition issues arise, please consult RD.   Kendi Defalco A. Jimmye Norman, RD, LDN, CDE Pager: [phone] After hours Pager: [phone]

## 2016-04-02 LAB — CULTURE, BLOOD (ROUTINE X 2)
CULTURE: NO GROWTH
Culture: NO GROWTH

## 2016-04-10 ENCOUNTER — Encounter: Payer: Self-pay | Admitting: Cardiovascular Disease

## 2016-04-10 ENCOUNTER — Ambulatory Visit (INDEPENDENT_AMBULATORY_CARE_PROVIDER_SITE_OTHER): Payer: Commercial Managed Care - HMO | Admitting: Cardiovascular Disease

## 2016-04-10 VITALS — BP 138/70 | HR 95 | Ht 73.0 in | Wt 214.0 lb

## 2016-04-10 DIAGNOSIS — I255 Ischemic cardiomyopathy: Secondary | ICD-10-CM | POA: Diagnosis not present

## 2016-04-10 DIAGNOSIS — I4891 Unspecified atrial fibrillation: Secondary | ICD-10-CM | POA: Diagnosis not present

## 2016-04-10 DIAGNOSIS — Z9289 Personal history of other medical treatment: Secondary | ICD-10-CM

## 2016-04-10 DIAGNOSIS — I25768 Atherosclerosis of bypass graft of coronary artery of transplanted heart with other forms of angina pectoris: Secondary | ICD-10-CM

## 2016-04-10 DIAGNOSIS — I10 Essential (primary) hypertension: Secondary | ICD-10-CM | POA: Diagnosis not present

## 2016-04-10 DIAGNOSIS — Z87898 Personal history of other specified conditions: Secondary | ICD-10-CM

## 2016-04-10 DIAGNOSIS — E785 Hyperlipidemia, unspecified: Secondary | ICD-10-CM

## 2016-04-10 MED ORDER — FUROSEMIDE 20 MG PO TABS: 20.0000 mg | ORAL_TABLET | Freq: Two times a day (BID) | ORAL | Status: DC

## 2016-04-10 MED ORDER — FUROSEMIDE 40 MG PO TABS: 40.0000 mg | ORAL_TABLET | Freq: Two times a day (BID) | ORAL | Status: DC

## 2016-04-10 MED ORDER — NITROGLYCERIN 0.4 MG SL SUBL
0.4000 mg | SUBLINGUAL_TABLET | SUBLINGUAL | Status: AC | PRN
Start: 2016-04-10 — End: ?

## 2016-04-10 NOTE — Patient Instructions (Addendum)
Medication Instructions:   Begin Nitroglycerin as needed for severe chest pain only - new sent to [facility] today.   Continue all other medications.    Labwork: NONE  Testing/Procedures: NONE  Follow-Up: Your physician wants you to follow up in: 6 months.  You will receive a reminder letter in the mail one-two months in advance.  If you don't receive a letter, please call our office to schedule the follow up appointment   Any Other Special Instructions Will Be Listed Below (If Applicable).  If you need a refill on your cardiac medications before your next appointment, please call your pharmacy.

## 2016-04-10 NOTE — Progress Notes (Signed)
Patient ID: Chris [name], male   DOB: July 16, 1941, 75 y.o.   MRN: [id number]       CARDIOLOGY CONSULT NOTE  Patient ID: Chris [name] MRN: [id number] DOB/AGE: 06/01/1941 75 y.o.  Admit date: (Not on file) Primary Physician: Gar Ponto, MD Referring Physician:   Reason for Consultation: CMP, a fib, CABG  HPI: The patient is a 75 year old male was hospitalized for pneumonia earlier this month. He reportedly has a history of poorly controlled type 2 diabetes, ischemic cardiomyopathy with EF 45-50% in April 2016, atrial fibrillation but not a candidate for chronic anticoagulation secondary to chronic epistaxis as well as hemorrhagic conversion of CVA, hypertension, and chronic kidney disease stage IV. He appears to have underwent coronary artery bypass graft surgery in 2001.  I reviewed most recent hospital documentation, labs, and studies. 7/8: BUN 72, creatinine 2.7. He is reportedly taking Lasix prescribed by his PCP. Peak troponin on 7/7 0.12. ECG 7/7 showed atrial fibrillation with a right bundle branch block and left posterior fascicular block. He very seldom has chest pains. Denies exertional dyspnea. He reportedly had been on warfarin in the past but is not taking anticoagulants other than baby aspirin.   Allergies  Allergen Reactions  . Ciprofloxacin Other (See Comments)    Sores all on legs.   . Finasteride     Affected  Patients testicles  . Lisinopril Other (See Comments)    Kidney damage    . Metoclopramide     Jerking all over  . Protonix [Pantoprazole Sodium]     abd pain  . Xanax [Alprazolam] Other (See Comments)    Not in right state of mind.     Current Outpatient Prescriptions  Medication Sig Dispense Refill  . alum & mag hydroxide-simeth (MAALOX/MYLANTA) 200-200-20 MG/5ML suspension Take 30 mLs by mouth 2 (two) times daily.    Marland Kitchen aspirin EC 81 MG tablet Take 81 mg by mouth daily.    Marland Kitchen atorvastatin (LIPITOR) 40 MG tablet Take 40 mg by mouth daily at 6 PM.      . Cholecalciferol (VITAMIN D-3) 5000 UNITS TABS Take 5,000 Units by mouth daily.     . insulin detemir (LEVEMIR) 100 UNIT/ML injection Inject 40-50 Units into the skin every morning.     Marland Kitchen LORazepam (ATIVAN) 0.5 MG tablet Take 0.5 mg by mouth 2 (two) times daily as needed for anxiety.    . metoprolol (LOPRESSOR) 50 MG tablet Take 50 mg by mouth 2 (two) times daily.    . Multiple Vitamin (MULTIVITAMIN WITH MINERALS) TABS tablet Take 1 tablet by mouth daily.    . 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    . ranitidine (ZANTAC) 150 MG tablet Take 150 mg by mouth daily as needed for heartburn. Reported on 03/07/2016    . triamcinolone cream (KENALOG) 0.1 % APPLY TO AFFECTED AREA DAILY AS DIRECTED 30 g 0   No current facility-administered medications for this visit.    Past Medical History  Diagnosis Date  . Acute cerebrovascular accident [facility])     presumed embolic from his atrial fibrillation, with hemorrhagic conbersion  . Diabetes mellitus   . Macrocytosis     normal B12 & folate levels  . Ischemic cardiomyopathy     EF of 45-50% - posterior & inferior walls are severly hypokinetic  . Atrial fibrillation ([place])     not candidate for chronic  anticoagulation with Coumadin given chrronic epistaxis as well as hemorrhagic conversion of his acute cva   . Hyperlipidemia   . Hypertension   . Complication of anesthesia   . PONV (postoperative nausea and vomiting)   . Acute renal insufficiency     resolved  . Myocardial infarct ([place])   . CHF (congestive heart failure) ([place])   . Shortness of breath   . Pleural effusion 10/05/2013  . Tubular adenoma     Past Surgical History  Procedure Laterality Date  . Coronary artery bypass graft    . Tonsillectomy    . Esophagogastroduodenoscopy (egd) with esophageal dilation N/A 10/27/2012    Dr. Gala Romney: distal esophageal diverticulum,  non-complete Schatzki's ring s/p dilation, distal esophageal nodule with benign path, negative Barrett's  . Cholecystectomy  2008  . Colonoscopy N/A 12/23/2012    Dr. Gala Romney: multiple rectal and colonic polyps removed, tubular adenomas. needs surveillance April 2017.   . Cardiac surgery    . Video assisted thoracoscopy Right 10/31/2013    Procedure: VIDEO ASSISTED THORACOSCOPY;  Surgeon: Melrose Nakayama, MD;  Location: [place];  Service: Thoracic;  Laterality: Right;  . Pleural effusion drainage Right 10/31/2013    Procedure: DRAINAGE OF PLEURAL EFFUSION;  Surgeon: Melrose Nakayama, MD;  Location: [place];  Service: Thoracic;  Laterality: Right;  . Chest tube insertion Right 10/31/2013    Procedure: INSERTION PLEURAL DRAINAGE CATHETER;  Surgeon: Melrose Nakayama, MD;  Location: [place];  Service: Thoracic;  Laterality: Right;    Social History   Social History  . Marital Status: Married    Spouse Name: N/A  . Number of Children: N/A  . Years of Education: N/A   Occupational History  . Not on file.   Social History Main Topics  . Smoking status: Never Smoker   . Smokeless tobacco: Never Used  . Alcohol Use: No  . Drug Use: No  . Sexual Activity: Not on file   Other Topics Concern  . Not on file   Social History Narrative     No family history of premature CAD in 1st degree relatives.  Prior to Admission medications   Medication Sig Start Date End Date Taking? Authorizing Provider  alum & mag hydroxide-simeth (MAALOX/MYLANTA) 200-200-20 MG/5ML suspension Take 30 mLs by mouth 2 (two) times daily.   Yes Historical Provider, MD  aspirin EC 81 MG tablet Take 81 mg by mouth daily.   Yes Historical Provider, MD  atorvastatin (LIPITOR) 40 MG tablet Take 40 mg by mouth daily at 6 PM.  01/07/14  Yes Historical Provider, MD  Cholecalciferol (VITAMIN D-3) 5000 UNITS TABS Take 5,000 Units by mouth daily.    Yes Historical Provider, MD  insulin detemir (LEVEMIR) 100 UNIT/ML injection  Inject 40-50 Units into the skin every morning.    Yes Historical Provider, MD  LORazepam (ATIVAN) 0.5 MG tablet Take 0.5 mg by mouth 2 (two) times daily as needed for anxiety.   Yes Historical Provider, MD  metoprolol (LOPRESSOR) 50 MG tablet Take 50 mg by mouth 2 (two) times daily.   Yes Historical Provider, MD  Multiple Vitamin (MULTIVITAMIN WITH MINERALS) TABS tablet Take 1 tablet by mouth daily.   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   Yes Historical Provider, MD  ranitidine (ZANTAC) 150 MG tablet Take 150 mg by mouth daily as needed for  heartburn. Reported on 03/07/2016   Yes Historical Provider, MD  triamcinolone cream (KENALOG) 0.1 % APPLY TO AFFECTED AREA DAILY AS DIRECTED 03/29/16  Yes Orvan Falconer, MD     Review of systems complete and found to be negative unless listed above in HPI     Physical exam Blood pressure 138/70, pulse 95, height 6\' 1"  (1.854 m), weight 214 lb (97.07 kg), SpO2 95 %. General: NAD Neck: No JVD, no thyromegaly or thyroid nodule.  Lungs: Clear to auscultation bilaterally with normal respiratory effort. CV: Nondisplaced PMI. Regular rate and irregular rhythm, normal S1/S2, no S3, no murmur.  No peripheral edema.  No carotid bruit.    Abdomen: Soft, nontender, mild distention.  Skin: Intact without lesions or rashes.  Neurologic: Alert.  Psych: Normal affect. HEENT: Normal.   ECG: Most recent ECG reviewed.  Labs:   Lab Results  Component Value Date   WBC 8.7 03/29/2016   HGB 11.8* 03/29/2016   HCT 36.2* 03/29/2016   MCV 104.9* 03/29/2016   PLT 178 03/29/2016   No results for input(s): NA, K, CL, CO2, BUN, CREATININE, CALCIUM, PROT, BILITOT, ALKPHOS, ALT, AST, GLUCOSE in the last 168 hours.  Invalid input(s): LABALBU Lab Results  Component Value Date   CKTOTAL 156 09/16/2009   CKMB 3.2 09/16/2009    TROPONINI 0.12* 03/29/2016    Lab Results  Component Value Date   CHOL  09/17/2009    114        ATP III CLASSIFICATION:  <200     mg/dL   Desirable  200-239  mg/dL   Borderline High  >=240    mg/dL   High          CHOL  09/16/2009    99        ATP III CLASSIFICATION:  <200     mg/dL   Desirable  200-239  mg/dL   Borderline High  >=240    mg/dL   High          Lab Results  Component Value Date   HDL 48 09/17/2009   HDL 50 09/16/2009   Lab Results  Component Value Date   LDLCALC  09/17/2009    49        Total Cholesterol/HDL:CHD Risk Coronary Heart Disease Risk Table                     Men   Women  1/2 Average Risk   3.4   3.3  Average Risk       5.0   4.4  2 X Average Risk   9.6   7.1  3 X Average Risk  23.4   11.0        Use the calculated Patient Ratio above and the CHD Risk Table to determine the patient's CHD Risk.        ATP III CLASSIFICATION (LDL):  <100     mg/dL   Optimal  100-129  mg/dL   Near or Above                    Optimal  130-159  mg/dL   Borderline  160-189  mg/dL   High  >190     mg/dL   Very High   LDLCALC  09/16/2009    37        Total Cholesterol/HDL:CHD Risk Coronary Heart Disease Risk Table  Men   Women  1/2 Average Risk   3.4   3.3  Average Risk       5.0   4.4  2 X Average Risk   9.6   7.1  3 X Average Risk  23.4   11.0        Use the calculated Patient Ratio above and the CHD Risk Table to determine the patient's CHD Risk.        ATP III CLASSIFICATION (LDL):  <100     mg/dL   Optimal  100-129  mg/dL   Near or Above                    Optimal  130-159  mg/dL   Borderline  160-189  mg/dL   High  >190     mg/dL   Very High   Lab Results  Component Value Date   TRIG 84 09/17/2009   TRIG 62 09/16/2009   Lab Results  Component Value Date   CHOLHDL 2.4 09/17/2009   CHOLHDL 2.0 09/16/2009   No results found for: LDLDIRECT       Studies: No results found.  ASSESSMENT AND PLAN:  1. CAD/CABG:  Appears to be symptomatically stable. Continue aspirin, metoprolol, and Lipitor. I may consider stress testing at his next visit. Will give prescription for sublingual nitroglycerin.  2. Atrial fibrillation: Heart rate is controlled on metoprolol. He has occasional palpitations. He is not interested in systemic anticoagulation.I explained to him that he is at an increased risk for stroke given his prior stroke and elevated CHADSVasc score of 7.  3. Essential HTN: Controlled. No changes.  4. Ischemic cardiomyopathy: No signs of heart failure. Continue beta blocker.  5. Hyperlipidemia: Continue Lipitor 40 mg.  Dispo: fu 6 months.   Signed: Kate Sable, M.D., F.A.C.C.  04/10/2016, 1:18 PM

## 2016-04-24 ENCOUNTER — Encounter: Payer: Self-pay | Admitting: *Deleted

## 2016-04-24 ENCOUNTER — Other Ambulatory Visit: Payer: Self-pay | Admitting: *Deleted

## 2016-04-24 NOTE — Patient Outreach (Signed)
[facility]) Care Management   04/24/2016  Chris [name] 1940/11/30 [id number]  Chris [name] is an 75 y.o. male  Subjective: Routine home visit with pt, HIPAA verified, pt reports his wife fell and broke her arms and is temporarily in SNF, pt states his son and daughter in law are staying with him and assisting with his care. Son states " mama used to do everything so we're trying to get used to all of this"  Pt states he has been eating a lot of carbohydrates (biscuits, raisin bran, pancakes, etc) and not very much protein at each meal.    Objective:   Vitals:   04/24/16 1248  BP: 128/64  Pulse: (!) 18  Resp: 18  Weight: 207 lb (93.9 kg)  Fasting CBG today 225 Fasting CBG log 68-582 Random CBG log in high 100's range, some 200's ROS  Physical Exam  Constitutional: He is oriented to person, place, and time. He appears well-developed and well-nourished.  HENT:  Head: Normocephalic.  Neck: Normal range of motion. Neck supple.  Cardiovascular: Normal rate.   Irregular rhythm  Respiratory: Effort normal and breath sounds normal.  GI: Soft. Bowel sounds are normal.  Musculoskeletal: Normal range of motion. He exhibits edema.  1+ edema lower extremites bil  Neurological: He is alert and oriented to person, place, and time.  Skin: Skin is dry.  Pt has telfa dressing intact to bil lower extremities applied by home health RN earlier today.  Psychiatric: He has a normal mood and affect. His behavior is normal. Judgment and thought content normal.    Encounter Medications:   Outpatient Encounter Prescriptions as of 04/24/2016  Medication Sig Note  . alum & mag hydroxide-simeth (MAALOX/MYLANTA) 200-200-20 MG/5ML suspension Take 30 mLs by mouth 2 (two) times daily.   Marland Kitchen aspirin EC 81 MG tablet Take 81 mg by mouth daily.   Marland Kitchen atorvastatin (LIPITOR) 40 MG tablet Take 40 mg by mouth daily at 6 PM.    . Cholecalciferol (VITAMIN D-3) 5000 UNITS TABS Take 5,000 Units by mouth  daily.    . furosemide (LASIX) 40 MG tablet Take 1 tablet (40 mg total) by mouth 2 (two) times daily.   . insulin detemir (LEVEMIR) 100 UNIT/ML injection Inject 40-50 Units into the skin every morning.  03/28/2016: 47 units this morning--very rarely, patient states that he will take 10 units in the evening  . LORazepam (ATIVAN) 0.5 MG tablet Take 0.5 mg by mouth 2 (two) times daily as needed for anxiety.   . metoprolol (LOPRESSOR) 50 MG tablet Take 50 mg by mouth 2 (two) times daily.   . Multiple Vitamin (MULTIVITAMIN WITH MINERALS) TABS tablet Take 1 tablet by mouth daily.   . 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   . ranitidine (ZANTAC) 150 MG tablet Take 150 mg by mouth daily as needed for heartburn. Reported on 03/07/2016   . triamcinolone cream (KENALOG) 0.1 % APPLY TO AFFECTED AREA DAILY AS DIRECTED   . nitroGLYCERIN (NITROSTAT) 0.4 MG SL tablet Place 1 tablet (0.4 mg total) under the tongue every 5 (five) minutes as needed for chest pain. (Patient not taking: Reported on 04/24/2016)    No facility-administered encounter medications on file as of 04/24/2016.     Functional Status:   In your present state of health, do you have any difficulty performing the following activities: 03/28/2016 03/07/2016  Hearing? [name]  Vision? N N  Difficulty concentrating or making decisions? N N  Walking or climbing stairs? Y Y  Dressing or bathing? N Y  Doing errands, shopping? N Y  Conservation officer, nature and eating ? - Y  Using the Toilet? - N  In the past six months, have you accidently leaked urine? - N  Do you have problems with loss of bowel control? - N  Managing your Medications? - Y  Managing your Finances? - N  Housekeeping or managing your Housekeeping? - Y  Some recent data might be hidden    Fall/Depression Screening:    PHQ 2/9 Scores 03/07/2016 02/26/2016 04/03/2015 01/16/2015  PHQ - 2  Score 0 0 0 0    Assessment:  Pt is still adjusting insulin as he sees fit, MD has been notified of this and pt not willing to change this practice at present. Pt continues to eat meals high in carbohydrates and sometimes not eating any protein at all.  RN CM focused on being mindful of carbohydrate intake and gave examples with family present as they are assisting with more of pt care now.  Home health continues to see pt and provide diabetic teaching as well as wound care.  RN CM discussed discharge plan and will reassess next month.  Pt continues to need reinforcement but has so far not been willing to change his diet.  [facility] CM Care Plan Problem One   Flowsheet Row Most Recent Value  Care Plan Problem One  Knowledge deficit related to diabetes management  Role Documenting the Problem One  Care Management [place] for Problem One  Active  THN Long Term Goal (31-90 days)  pt will have reduction in Hgb AIC by 1 point within 90 days  (currently 8.8)  THN Long Term Goal Start Date  03/07/16  Interventions for Problem One Long Term Goal  RN CM reviewed with pt and family correct insulin dosage and taking medications as prescribed, reviewed importance of eating 3 meals plus snacks and adequate protein at each meal.  THN CM Short Term Goal #1 (0-30 days)  pt will verbalize actions to take for hypoglycemia within 30 days.  THN CM Short Term Goal #1 Start Date  04/24/16 [goal restarted- pt and family need reinforcement]  Interventions for Short Term Goal #1  RN CM retaught with pt and family signs/ symptoms hypoglycemia and actions to take  [facility] CM Short Term Goal #2 (0-30 days)  pt will verbalize plate method within 30 days.  THN CM Short Term Goal #2 Start Date  05/01/16 [goal restarted- pt and family need reinforcement]  Interventions for Short Term Goal #2  RN CM retaught and reviewed plate method, instructed on foods high in carbohydrates and how many carbohydrates allowed at each meal.     [facility] CM Care Plan Problem Two   Flowsheet Row Most Recent Value  THN CM Short Term Goal #1 Met Date   -- [goal met]      Plan: follow up with home visit 05/23/16 Assess weight and CBG Collaborate with home health as needed  Jacqlyn Larsen [facility] [place] [phone]

## 2016-05-23 ENCOUNTER — Encounter: Payer: Self-pay | Admitting: *Deleted

## 2016-05-23 ENCOUNTER — Other Ambulatory Visit: Payer: Self-pay | Admitting: *Deleted

## 2016-05-23 NOTE — Patient Outreach (Signed)
[facility]) Care Management   05/23/2016  Chris [name] [birth year] [id number]  Chris [name] is an 75 y.o. male  Subjective: Routine home visit with pt, HIPAA verified, pt reports home health discharged because all wounds were healed on legs and MD ordered compression hose (which pt has refused to get or wear), pt states he now has "several new wounds on legs and toes and wife dressing them daily"  Pt states he takes "insulin like I see fit"  Pt reports he does not have CBG log available at present.  Pt reports he went to wound center and is supposed to follow up but may not go back.  Objective:  Vitals:   05/23/16 1348  BP: 138/70  Pulse: 76  Resp: 16  SpO2: 96%  Weight: 201 lb (91.2 kg)  Pt reports fasting CBG today 275, yesterday's reading 75 ROS  Physical Exam  Constitutional: He is oriented to person, place, and time. He appears well-developed and well-nourished.  HENT:  Head: Normocephalic.  Neck: Normal range of motion. Neck supple.  Cardiovascular: Normal rate.   Respiratory: Effort normal and breath sounds normal.  GI: Soft. Bowel sounds are normal.  Musculoskeletal: He exhibits edema.  1-2+ edema lower extremites bil  Neurological: He is alert and oriented to person, place, and time.  Skin:  Pt has multiple superficial open skin lesions to lower extremities anteriorally, toes on right and left foot, wife has applied telfa, 4x4's to all open areas.  Psychiatric: He has a normal mood and affect. His behavior is normal. Thought content normal.    Encounter Medications:   Outpatient Encounter Prescriptions as of 05/23/2016  Medication Sig Note  . alum & mag hydroxide-simeth (MAALOX/MYLANTA) 200-200-20 MG/5ML suspension Take 30 mLs by mouth 2 (two) times daily.   Marland Kitchen aspirin EC 81 MG tablet Take 81 mg by mouth daily.   Marland Kitchen atorvastatin (LIPITOR) 40 MG tablet Take 40 mg by mouth daily at 6 PM.    . Cholecalciferol (VITAMIN D-3) 5000 UNITS TABS Take 5,000  Units by mouth daily.    . furosemide (LASIX) 40 MG tablet Take 1 tablet (40 mg total) by mouth 2 (two) times daily.   . insulin detemir (LEVEMIR) 100 UNIT/ML injection Inject 40-50 Units into the skin every morning.  03/28/2016: 47 units this morning--very rarely, patient states that he will take 10 units in the evening  . LORazepam (ATIVAN) 0.5 MG tablet Take 0.5 mg by mouth 2 (two) times daily as needed for anxiety.   . metoprolol (LOPRESSOR) 50 MG tablet Take 50 mg by mouth 2 (two) times daily.   . Multiple Vitamin (MULTIVITAMIN WITH MINERALS) TABS tablet Take 1 tablet by mouth daily.   . 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   . ranitidine (ZANTAC) 150 MG tablet Take 150 mg by mouth daily as needed for heartburn. Reported on 03/07/2016   . triamcinolone cream (KENALOG) 0.1 % APPLY TO AFFECTED AREA DAILY AS DIRECTED   . nitroGLYCERIN (NITROSTAT) 0.4 MG SL tablet Place 1 tablet (0.4 mg total) under the tongue every 5 (five) minutes as needed for chest pain. (Patient not taking: Reported on 04/24/2016)    No facility-administered encounter medications on file as of 05/23/2016.     Functional Status:   In your present state of health, do you have any difficulty performing the following activities: 03/28/2016 03/07/2016  Hearing? [name]  Vision? N N  Difficulty concentrating or making decisions? N N  Walking or climbing stairs? Y Y  Dressing or bathing? N Y  Doing errands, shopping? N Y  Conservation officer, nature and eating ? - Y  Using the Toilet? - N  In the past six months, have you accidently leaked urine? - N  Do you have problems with loss of bowel control? - N  Managing your Medications? - Y  Managing your Finances? - N  Housekeeping or managing your Housekeeping? - Y  Some recent data might be hidden    Fall/Depression Screening:    PHQ 2/9 Scores 03/07/2016 02/26/2016 04/03/2015 01/16/2015   PHQ - 2 Score 0 0 0 0    Assessment:  RN CM called primary MD office (see care plan), faxed today's note to primary MD Dr. Quillian Quince.  Pt continues to take insulin dosage as he sees fit and not always as MD ordered.  Pt continues to eat too many carbohydrates and does not seem willing to change this.  Pt does not want to wear compression hose, does not want to go back to wound care center.  RN CM reinforced importance of wearing compression hose consistently (after wounds heal) to prevent future skin breakdown.  Pt does not seem to understand the importance of this or the correlation between elevated CBG and skin breakdown/ delayed healing.  [facility] CM Care Plan Problem One   Flowsheet Row Most Recent Value  Care Plan Problem One  Knowledge deficit related to diabetes management  Role Documenting the Problem One  Care Management [employer] for Problem One  Active  THN Long Term Goal (31-90 days)  pt will have reduction in Hgb AIC by 1 point within 90 days  (currently 8.8)  THN Long Term Goal Start Date  03/07/16  Interventions for Problem One Long Term Goal  RN CM reviewed and reinforced with pt and family correct insulin dosage and taking medications as prescribed, reviewed importance of eating 3 meals plus snacks and adequate protein at each meal.  THN CM Short Term Goal #1 (0-30 days)  pt will verbalize actions to take for hypoglycemia within 30 days.  THN CM Short Term Goal #1 Start Date  05/23/16 [goal restarted]  Interventions for Short Term Goal #1  RN CM retaught and reinforced with pt and wife signs/ symptoms hypoglycemia and actions to take  [facility] CM Short Term Goal #2 (0-30 days)  pt will verbalize plate method within 30 days.  THN CM Short Term Goal #2 Start Date  05/23/16 [goal restarted- needs reinforcement]  Interventions for Short Term Goal #2  RN CM retaught and reviewed plate method, instructed on foods high in carbohydrates and how many carbohydrates allowed at each meal.     [facility] CM Care Plan Problem Two   Flowsheet Row Most Recent Value  Care Plan Problem Two  Alteration in skin integrity  Role Documenting the Problem Two  Care Management Coordinator  Care Plan for Problem Two  Active  THN CM Short Term Goal #1 (0-30 days)  Pt will follow up with wound center / MD and show improvement in skin integrity within 30 days.  THN CM Short Term Goal #1 Start Date  05/23/16 [goal restarted- pt has new wounds]  THN CM Short Term Goal #1 Met Date   -- [goal met]  Interventions for Short Term Goal #2   RN CM called primary care Dr. Quillian Quince, spoke with Loma Sousa, reported  pt has multiple superficial wounds with moderate drainage on lower legs and toes bil., reported pt never got compression hose to wear, per MD- will see pt today at 230 pm appointment, patient's son will take pt to MD.        Plan: call pt next week for follow up on leg wounds/ MD visit today See pt for home visit 06/24/16  Jacqlyn Larsen [facility], BSN [place] Coordinator [phone]

## 2016-05-27 ENCOUNTER — Other Ambulatory Visit: Payer: Self-pay | Admitting: *Deleted

## 2016-05-27 ENCOUNTER — Encounter: Payer: Self-pay | Admitting: *Deleted

## 2016-05-27 NOTE — Patient Outreach (Signed)
05/27/16- Telephone call to patient for follow up on pt seeing primary MD Friday 05/23/16- spoke with wife Elijah Birk, HIPAA verified, wife states pt saw another provider at MD office and states " they're gonna order home health again for his legs but we haven't heard from anyone yet" Telephone call to [place], spoke with [facility] RN who reports they do not have an order. RN CM faxed note over to Dr. Arcola Jansky office asking if home health is to be ordered and reported home health does not have an order yet.  RN CM called Dr. Ar[place]a Jansky office and spoke with Myriam Jacobson, reported the above information, she will let Dr. Quillian Quince know and will give MD the fax.  Jacqlyn Larsen [facility] [place], [place] Coordinator [phone]

## 2016-05-28 ENCOUNTER — Other Ambulatory Visit: Payer: Self-pay | Admitting: *Deleted

## 2016-05-28 NOTE — Patient Outreach (Signed)
05/28/16- Telephone call to patient for follow up on home health services, spoke with son Kuper Sivers, HIPAA verified, Ronalee Belts reports pt unable to talk at present due to home health RN is with pt at present, services in place.  Jacqlyn Larsen [facility], [place] Coordinator [phone]

## 2016-06-24 ENCOUNTER — Encounter: Payer: Self-pay | Admitting: *Deleted

## 2016-06-24 ENCOUNTER — Other Ambulatory Visit: Payer: Self-pay | Admitting: *Deleted

## 2016-06-24 NOTE — Patient Outreach (Signed)
[facility]) Care Management   06/24/2016  ZED WANNINGER [birth year] [id number]  Chris [name] is an 75 y.o. male  Subjective: Routine home visit with pt, HIPAA verified, pt reports no medication changes, home health continues to see pt to wrap legs, pt has compression hose on order and plans to start using them.  Pt reports taking all medications as prescribed.  Reports " blood sugar about the same"  Pt states he is "eating about the same things"  Objective:   Vitals:   06/24/16 1723  BP: 138/62  Pulse: 94  Resp: 18  SpO2: 96%  Weight: 205 lb (93 kg)  CBG today 162. ROS  Physical Exam  Constitutional: He is oriented to person, place, and time. He appears well-developed.  HENT:  Head: Normocephalic.  Neck: Normal range of motion. Neck supple.  Cardiovascular: Normal rate.   Irregular rhythm  Respiratory: Effort normal and breath sounds normal.  GI: Soft.  Musculoskeletal: Normal range of motion.  1+ edema lower extremities bil  Neurological: He is alert and oriented to person, place, and time.  Skin: Skin is warm and dry.  Psychiatric: He has a normal mood and affect. His behavior is normal. Judgment and thought content normal.  Home health continues to see pt for wound care to lower extremity wounds    Encounter Medications:   Outpatient Encounter Prescriptions as of 06/24/2016  Medication Sig Note  . alum & mag hydroxide-simeth (MAALOX/MYLANTA) 200-200-20 MG/5ML suspension Take 30 mLs by mouth 2 (two) times daily.   Marland Kitchen aspirin EC 81 MG tablet Take 81 mg by mouth daily.   Marland Kitchen atorvastatin (LIPITOR) 40 MG tablet Take 40 mg by mouth daily at 6 PM.    . Cholecalciferol (VITAMIN D-3) 5000 UNITS TABS Take 5,000 Units by mouth daily.    . furosemide (LASIX) 40 MG tablet Take 1 tablet (40 mg total) by mouth 2 (two) times daily.   . insulin detemir (LEVEMIR) 100 UNIT/ML injection Inject 40-50 Units into the skin every morning.  03/28/2016: 47 units this morning--very  rarely, patient states that he will take 10 units in the evening  . LORazepam (ATIVAN) 0.5 MG tablet Take 0.5 mg by mouth 2 (two) times daily as needed for anxiety.   . metoprolol (LOPRESSOR) 50 MG tablet Take 50 mg by mouth 2 (two) times daily.   . Multiple Vitamin (MULTIVITAMIN WITH MINERALS) TABS tablet Take 1 tablet by mouth daily.   . 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   . ranitidine (ZANTAC) 150 MG tablet Take 150 mg by mouth daily as needed for heartburn. Reported on 03/07/2016   . triamcinolone cream (KENALOG) 0.1 % APPLY TO AFFECTED AREA DAILY AS DIRECTED   . nitroGLYCERIN (NITROSTAT) 0.4 MG SL tablet Place 1 tablet (0.4 mg total) under the tongue every 5 (five) minutes as needed for chest pain. (Patient not taking: Reported on 06/24/2016)    No facility-administered encounter medications on file as of 06/24/2016.     Functional Status:   In your present state of health, do you have any difficulty performing the following activities: 03/28/2016 03/07/2016  Hearing? [name]  Vision? N N  Difficulty concentrating or making decisions? N N  Walking or climbing stairs? Y Y  Dressing or bathing? N Y  Doing errands, shopping? N Y  Conservation officer, nature and eating ? - Y  Using  the Toilet? - N  In the past six months, have you accidently leaked urine? - N  Do you have problems with loss of bowel control? - N  Managing your Medications? - Y  Managing your Finances? - N  Housekeeping or managing your Housekeeping? - Y  Some recent data might be hidden    Fall/Depression Screening:    PHQ 2/9 Scores 03/07/2016 02/26/2016 04/03/2015 01/16/2015  PHQ - 2 Score 0 0 0 0    Assessment:  RN CM discussed discharge plan with pt and wife,  Pt will be discharged today and refuses RN health coach services, pt does not feel he can make further changes at this time related to his health/ self care  behaviors.  Pt feels blood sugar "is about as good as it's gonna get" and does not want to work further on this.  RN CM reviewed action plan and importance of calling MD early for change in health status.  RN CM faxed case closure letter to primary MD Dr. Quillian Quince , mailed case closure letter to patient's home.  [facility] CM Care Plan Problem One   Flowsheet Row Most Recent Value  Care Plan Problem One  Knowledge deficit related to diabetes management  Role Documenting the Problem One  Care Management [place] for Problem One  Active  [facility] Long Term Goal (31-90 days)  pt will have reduction in Hgb AIC by 1 point within 90 days  (currently 8.8)  [facility] Long Term Goal Start Date  03/07/16  Interventions for Problem One Long Term Goal  RN CM reiterated with pt and family correct insulin dosage and taking medications as prescribed, reviewed importance of eating 3 meals plus snacks and adequate protein at each meal.  [facility] CM Short Term Goal #1 (0-30 days)  pt will verbalize actions to take for hypoglycemia within 30 days.  [facility] CM Short Term Goal #1 Start Date  05/23/16 [goal restarted]  [facility] CM Short Term Goal #1 Met Date  06/24/16  Interventions for Short Term Goal #1  RN CM reveiewed with pt and wife signs/ symptoms hypoglycemia and actions to take  [facility] CM Short Term Goal #2 (0-30 days)  pt will verbalize plate method within 30 days.  [facility] CM Short Term Goal #2 Start Date  05/23/16 [goal restarted- needs reinforcement]  [facility] CM Short Term Goal #2 Met Date  06/24/16  Interventions for Short Term Goal #2  RN CM reinforced plate method, instructed on foods high in carbohydrates and how many carbohydrates allowed at each meal.    [facility] CM Care Plan Problem Two   Flowsheet Row Most Recent Value  Care Plan Problem Two  Alteration in skin integrity  Role Documenting the Problem Two  Care Management Coordinator  Care Plan for Problem Two  Active  [facility] CM Short Term Goal #1 (0-30 days)  Pt will follow up with  wound center / MD and show improvement in skin integrity within 30 days.  [facility] CM Short Term Goal #1 Start Date  05/23/16 [goal restarted- pt has new wounds]  [facility] CM Short Term Goal #1 Met Date   06/24/16      Plan: discharge today  Jacqlyn Larsen [facility], BSN [place] Coordinator [phone]

## 2016-06-27 ENCOUNTER — Other Ambulatory Visit: Payer: Self-pay

## 2016-06-27 NOTE — Patient Outreach (Signed)
I called Mrs. Hanko who is on the consent form to verify that Mr. Alana had received Extra Help.  She stated that he had been approved for Extra Help.  I stated that if he had any other pharmacy needs, I would be happy to assist.  I will close his case to pharmacy.   Deanne Coffer, PharmD, [place] [phone]

## 2016-06-30 ENCOUNTER — Other Ambulatory Visit: Payer: Self-pay | Admitting: Pharmacist

## 2016-06-30 NOTE — Patient Outreach (Signed)
Outreach call to [employer] regarding his request for follow up from the [facility] Medication Adherence Campaign. Called and spoke with patient. HIPAA identifiers verified and verbal consent received. Mr. Lipsett gives me permission to speak with his wife as he reports having difficulty with hearing over the phone.   Mrs. Radman reports that the patient has been taking his atorvastatin daily as directed. Denies any missed doses; reports that she gives the patient his medication each day. Denies any barriers to taking his medications such as cost or side effects.  Mrs. Woodham lets me know that she is changing her husband's dressings on his legs everyday. Reports that a nurse comes once a week to check on the dressings. Reports that until recently the patient had been seen by [facility] RNCM Jacqlyn Larsen. Mrs. Voisin reports that he was discharged by Almyra Free last week. Mrs. Sandefer confirms that she has Julie's phone number.   Will place coordination of care call to [facility] to let her know of my conversation with patient's wife today.  Harlow Asa, PharmD Clinical Pharmacist [place] [facility] [phone]

## 2016-07-13 ENCOUNTER — Inpatient Hospital Stay (HOSPITAL_COMMUNITY)
Admission: EM | Admit: 2016-07-13 | Discharge: 2016-07-17 | DRG: 291 | Disposition: A | Discharge status: Home / Self Care | Payer: Commercial Managed Care - HMO | Attending: Family Medicine | Admitting: Family Medicine

## 2016-07-13 ENCOUNTER — Emergency Department (HOSPITAL_COMMUNITY): Payer: Commercial Managed Care - HMO

## 2016-07-13 ENCOUNTER — Encounter (HOSPITAL_COMMUNITY): Payer: Self-pay | Admitting: Emergency Medicine

## 2016-07-13 DIAGNOSIS — I5023 Acute on chronic systolic (congestive) heart failure: Secondary | ICD-10-CM | POA: Diagnosis not present

## 2016-07-13 DIAGNOSIS — E1165 Type 2 diabetes mellitus with hyperglycemia: Secondary | ICD-10-CM | POA: Diagnosis present

## 2016-07-13 DIAGNOSIS — N179 Acute kidney failure, unspecified: Secondary | ICD-10-CM | POA: Diagnosis present

## 2016-07-13 DIAGNOSIS — I130 Hypertensive heart and chronic kidney disease with heart failure and stage 1 through stage 4 chronic kidney disease, or unspecified chronic kidney disease: Principal | ICD-10-CM | POA: Diagnosis present

## 2016-07-13 DIAGNOSIS — R7989 Other specified abnormal findings of blood chemistry: Secondary | ICD-10-CM

## 2016-07-13 DIAGNOSIS — R140 Abdominal distension (gaseous): Secondary | ICD-10-CM

## 2016-07-13 DIAGNOSIS — I2510 Atherosclerotic heart disease of native coronary artery without angina pectoris: Secondary | ICD-10-CM | POA: Diagnosis present

## 2016-07-13 DIAGNOSIS — I482 Chronic atrial fibrillation, unspecified: Secondary | ICD-10-CM | POA: Diagnosis present

## 2016-07-13 DIAGNOSIS — I872 Venous insufficiency (chronic) (peripheral): Secondary | ICD-10-CM | POA: Diagnosis present

## 2016-07-13 DIAGNOSIS — N184 Chronic kidney disease, stage 4 (severe): Secondary | ICD-10-CM | POA: Diagnosis present

## 2016-07-13 DIAGNOSIS — Z8371 Family history of colonic polyps: Secondary | ICD-10-CM

## 2016-07-13 DIAGNOSIS — N189 Chronic kidney disease, unspecified: Secondary | ICD-10-CM

## 2016-07-13 DIAGNOSIS — R601 Generalized edema: Secondary | ICD-10-CM

## 2016-07-13 DIAGNOSIS — Z794 Long term (current) use of insulin: Secondary | ICD-10-CM

## 2016-07-13 DIAGNOSIS — R2689 Other abnormalities of gait and mobility: Secondary | ICD-10-CM

## 2016-07-13 DIAGNOSIS — N289 Disorder of kidney and ureter, unspecified: Secondary | ICD-10-CM

## 2016-07-13 DIAGNOSIS — R778 Other specified abnormalities of plasma proteins: Secondary | ICD-10-CM | POA: Diagnosis present

## 2016-07-13 DIAGNOSIS — Z66 Do not resuscitate: Secondary | ICD-10-CM | POA: Diagnosis present

## 2016-07-13 DIAGNOSIS — I10 Essential (primary) hypertension: Secondary | ICD-10-CM

## 2016-07-13 DIAGNOSIS — D631 Anemia in chronic kidney disease: Secondary | ICD-10-CM | POA: Diagnosis present

## 2016-07-13 DIAGNOSIS — E875 Hyperkalemia: Secondary | ICD-10-CM | POA: Diagnosis present

## 2016-07-13 DIAGNOSIS — R110 Nausea: Secondary | ICD-10-CM

## 2016-07-13 DIAGNOSIS — E871 Hypo-osmolality and hyponatremia: Secondary | ICD-10-CM | POA: Diagnosis present

## 2016-07-13 DIAGNOSIS — I255 Ischemic cardiomyopathy: Secondary | ICD-10-CM | POA: Diagnosis present

## 2016-07-13 DIAGNOSIS — I878 Other specified disorders of veins: Secondary | ICD-10-CM | POA: Diagnosis present

## 2016-07-13 DIAGNOSIS — F4323 Adjustment disorder with mixed anxiety and depressed mood: Secondary | ICD-10-CM | POA: Diagnosis not present

## 2016-07-13 DIAGNOSIS — E1122 Type 2 diabetes mellitus with diabetic chronic kidney disease: Secondary | ICD-10-CM | POA: Diagnosis not present

## 2016-07-13 DIAGNOSIS — R748 Abnormal levels of other serum enzymes: Secondary | ICD-10-CM

## 2016-07-13 DIAGNOSIS — I5043 Acute on chronic combined systolic (congestive) and diastolic (congestive) heart failure: Secondary | ICD-10-CM | POA: Diagnosis present

## 2016-07-13 DIAGNOSIS — F4320 Adjustment disorder, unspecified: Secondary | ICD-10-CM | POA: Diagnosis present

## 2016-07-13 DIAGNOSIS — I252 Old myocardial infarction: Secondary | ICD-10-CM

## 2016-07-13 DIAGNOSIS — M6281 Muscle weakness (generalized): Secondary | ICD-10-CM

## 2016-07-13 DIAGNOSIS — Z951 Presence of aortocoronary bypass graft: Secondary | ICD-10-CM

## 2016-07-13 DIAGNOSIS — Z7982 Long term (current) use of aspirin: Secondary | ICD-10-CM

## 2016-07-13 DIAGNOSIS — E785 Hyperlipidemia, unspecified: Secondary | ICD-10-CM | POA: Diagnosis present

## 2016-07-13 HISTORY — DX: Type 2 diabetes mellitus without complications: E11.9

## 2016-07-13 HISTORY — DX: Atherosclerotic heart disease of native coronary artery without angina pectoris: I25.10

## 2016-07-13 HISTORY — DX: Essential (primary) hypertension: I10

## 2016-07-13 HISTORY — DX: Chronic systolic (congestive) heart failure: I50.22

## 2016-07-13 LAB — CBC WITH DIFFERENTIAL/PLATELET
Basophils Absolute: 0 10*3/uL (ref 0.0–0.1)
Basophils Relative: 0 %
Eosinophils Absolute: 0 10*3/uL (ref 0.0–0.7)
Eosinophils Relative: 0 %
HEMATOCRIT: 32.9 % — AB (ref 39.0–52.0)
HEMOGLOBIN: 10.6 g/dL — AB (ref 13.0–17.0)
LYMPHS ABS: 0.7 10*3/uL (ref 0.7–4.0)
Lymphocytes Relative: 12 %
MCH: 34.9 pg — AB (ref 26.0–34.0)
MCHC: 32.2 g/dL (ref 30.0–36.0)
MCV: 108.2 fL — ABNORMAL HIGH (ref 78.0–100.0)
MONOS PCT: 10 %
Monocytes Absolute: 0.6 10*3/uL (ref 0.1–1.0)
NEUTROS ABS: 4.3 10*3/uL (ref 1.7–7.7)
NEUTROS PCT: 78 %
Platelets: 149 10*3/uL — ABNORMAL LOW (ref 150–400)
RBC: 3.04 MIL/uL — AB (ref 4.22–5.81)
RDW: 15.4 % (ref 11.5–15.5)
WBC: 5.6 10*3/uL (ref 4.0–10.5)

## 2016-07-13 LAB — COMPREHENSIVE METABOLIC PANEL
ALBUMIN: 3.3 g/dL — AB (ref 3.5–5.0)
ALK PHOS: 139 U/L — AB (ref 38–126)
ALT: 18 U/L (ref 17–63)
ANION GAP: 10 (ref 5–15)
AST: 35 U/L (ref 15–41)
BILIRUBIN TOTAL: 1.9 mg/dL — AB (ref 0.3–1.2)
BUN: 70 mg/dL — ABNORMAL HIGH (ref 6–20)
CALCIUM: 8.8 mg/dL — AB (ref 8.9–10.3)
CO2: 29 mmol/L (ref 22–32)
CREATININE: 3.18 mg/dL — AB (ref 0.61–1.24)
Chloride: 92 mmol/L — ABNORMAL LOW (ref 101–111)
GFR calc Af Amer: 20 mL/min — ABNORMAL LOW (ref >60)
GFR calc non Af Amer: 18 mL/min — ABNORMAL LOW (ref >60)
GLUCOSE: 599 mg/dL — AB (ref 65–99)
Potassium: 5.3 mmol/L — ABNORMAL HIGH (ref 3.5–5.1)
SODIUM: 131 mmol/L — AB (ref 135–145)
TOTAL PROTEIN: 7.5 g/dL (ref 6.5–8.1)

## 2016-07-13 LAB — URINALYSIS, ROUTINE W REFLEX MICROSCOPIC
BILIRUBIN URINE: NEGATIVE
HGB URINE DIPSTICK: NEGATIVE
KETONES UR: NEGATIVE mg/dL
LEUKOCYTES UA: NEGATIVE
Nitrite: NEGATIVE
PH: 6.5 (ref 5.0–8.0)
PROTEIN: NEGATIVE mg/dL
Specific Gravity, Urine: <1.005 — ABNORMAL LOW (ref 1.005–1.030)

## 2016-07-13 LAB — URINE MICROSCOPIC-ADD ON

## 2016-07-13 LAB — GLUCOSE, CAPILLARY
GLUCOSE-CAPILLARY: 569 mg/dL — AB (ref 65–99)
GLUCOSE-CAPILLARY: 543 mg/dL — AB (ref 65–99)

## 2016-07-13 LAB — BRAIN NATRIURETIC PEPTIDE: B Natriuretic Peptide: 506 pg/mL — ABNORMAL HIGH (ref 0.0–100.0)

## 2016-07-13 LAB — TROPONIN I: Troponin I: 0.07 ng/mL (ref <0.03)

## 2016-07-13 LAB — CBG MONITORING, ED: GLUCOSE-CAPILLARY: 502 mg/dL — AB (ref 65–99)

## 2016-07-13 MED ORDER — LORAZEPAM 0.5 MG PO TABS
0.5000 mg | ORAL_TABLET | Freq: Two times a day (BID) | ORAL | Status: DC | PRN
  Filled 2016-07-13: qty 1

## 2016-07-13 MED ORDER — FAMOTIDINE 20 MG PO TABS
20.0000 mg | ORAL_TABLET | Freq: Two times a day (BID) | ORAL | Status: DC
  Administered 2016-07-13 – 2016-07-14 (×2): 20 mg via ORAL
  Filled 2016-07-13 (×2): qty 1

## 2016-07-13 MED ORDER — DEXTROSE-NACL 5-0.45 % IV SOLN
INTRAVENOUS | Status: DC
  Administered 2016-07-14: 75 mL via INTRAVENOUS

## 2016-07-13 MED ORDER — SODIUM CHLORIDE 0.9 % IV SOLN
INTRAVENOUS | Status: AC
  Filled 2016-07-13: qty 2.5

## 2016-07-13 MED ORDER — HYDROXYZINE HCL 25 MG PO TABS: 25.0000 mg | ORAL_TABLET | Freq: Three times a day (TID) | ORAL | Status: DC | PRN

## 2016-07-13 MED ORDER — INSULIN ASPART 100 UNIT/ML ~~LOC~~ SOLN
SUBCUTANEOUS | Status: DC
Start: 2016-07-13 — End: 2016-07-13
  Filled 2016-07-13: qty 1

## 2016-07-13 MED ORDER — ONDANSETRON HCL 4 MG PO TABS: 4.0000 mg | ORAL_TABLET | Freq: Four times a day (QID) | ORAL | Status: DC | PRN

## 2016-07-13 MED ORDER — ONDANSETRON HCL 4 MG/2ML IJ SOLN: 4.0000 mg | Freq: Four times a day (QID) | INTRAMUSCULAR | Status: DC | PRN

## 2016-07-13 MED ORDER — ACETAMINOPHEN 325 MG PO TABS: 650.0000 mg | ORAL_TABLET | Freq: Four times a day (QID) | ORAL | Status: DC | PRN

## 2016-07-13 MED ORDER — SODIUM CHLORIDE 0.9 % IV SOLN
INTRAVENOUS | Status: DC
  Administered 2016-07-13: 4.8 [IU]/h via INTRAVENOUS
  Filled 2016-07-13: qty 2.5

## 2016-07-13 MED ORDER — SODIUM CHLORIDE 0.9% FLUSH
3.0000 mL | Freq: Two times a day (BID) | INTRAVENOUS | Status: DC
  Administered 2016-07-14 – 2016-07-16 (×6): 3 mL via INTRAVENOUS

## 2016-07-13 MED ORDER — SORBITOL 70 % SOLN
30.0000 mL | Status: DC | PRN
  Administered 2016-07-17: 30 mL via ORAL
  Filled 2016-07-13: qty 30

## 2016-07-13 MED ORDER — ALUM & MAG HYDROXIDE-SIMETH 200-200-20 MG/5ML PO SUSP: 30.0000 mL | Freq: Every day | ORAL | Status: DC | PRN

## 2016-07-13 MED ORDER — INSULIN REGULAR BOLUS VIA INFUSION
0.0000 [IU] | Freq: Three times a day (TID) | INTRAVENOUS | Status: DC
  Filled 2016-07-13: qty 10

## 2016-07-13 MED ORDER — FUROSEMIDE 10 MG/ML IJ SOLN
60.0000 mg | Freq: Once | INTRAMUSCULAR | Status: AC
  Administered 2016-07-13: 60 mg via INTRAVENOUS
  Filled 2016-07-13: qty 6

## 2016-07-13 MED ORDER — ACETAMINOPHEN 650 MG RE SUPP: 650.0000 mg | Freq: Four times a day (QID) | RECTAL | Status: DC | PRN

## 2016-07-13 MED ORDER — METOPROLOL TARTRATE 50 MG PO TABS
50.0000 mg | ORAL_TABLET | Freq: Two times a day (BID) | ORAL | Status: DC
  Administered 2016-07-13 – 2016-07-15 (×4): 50 mg via ORAL
  Filled 2016-07-13 (×4): qty 1

## 2016-07-13 MED ORDER — ATORVASTATIN CALCIUM 40 MG PO TABS
40.0000 mg | ORAL_TABLET | Freq: Every day | ORAL | Status: DC
  Administered 2016-07-14 – 2016-07-17 (×4): 40 mg via ORAL
  Filled 2016-07-13 (×4): qty 1

## 2016-07-13 MED ORDER — PREDNISOLONE ACETATE 1 % OP SUSP
1.0000 [drp] | Freq: Four times a day (QID) | OPHTHALMIC | Status: DC | PRN
  Filled 2016-07-13: qty 1

## 2016-07-13 MED ORDER — DOCUSATE SODIUM 100 MG PO CAPS
100.0000 mg | ORAL_CAPSULE | Freq: Two times a day (BID) | ORAL | Status: DC
  Administered 2016-07-13 – 2016-07-17 (×8): 100 mg via ORAL
  Filled 2016-07-13 (×8): qty 1

## 2016-07-13 MED ORDER — ASPIRIN EC 81 MG PO TBEC
81.0000 mg | DELAYED_RELEASE_TABLET | Freq: Every day | ORAL | Status: DC
  Administered 2016-07-14 – 2016-07-17 (×4): 81 mg via ORAL
  Filled 2016-07-13 (×4): qty 1

## 2016-07-13 MED ORDER — CAMPHOR-MENTHOL 0.5-0.5 % EX LOTN
1.0000 | TOPICAL_LOTION | Freq: Three times a day (TID) | CUTANEOUS | Status: DC | PRN
Start: 2016-07-13 — End: 2016-07-17
  Filled 2016-07-13: qty 222

## 2016-07-13 MED ORDER — CALCIUM CARBONATE ANTACID 1250 MG/5ML PO SUSP
500.0000 mg | Freq: Four times a day (QID) | ORAL | Status: DC | PRN
  Filled 2016-07-13: qty 5

## 2016-07-13 MED ORDER — ZOLPIDEM TARTRATE 5 MG PO TABS: 5.0000 mg | ORAL_TABLET | Freq: Every evening | ORAL | Status: DC | PRN

## 2016-07-13 MED ORDER — ENOXAPARIN SODIUM 30 MG/0.3ML ~~LOC~~ SOLN
30.0000 mg | SUBCUTANEOUS | Status: DC
  Administered 2016-07-13 – 2016-07-16 (×4): 30 mg via SUBCUTANEOUS
  Filled 2016-07-13 (×4): qty 0.3

## 2016-07-13 MED ORDER — NEPRO/CARBSTEADY PO LIQD: 237.0000 mL | Freq: Three times a day (TID) | ORAL | Status: DC | PRN

## 2016-07-13 MED ORDER — SODIUM CHLORIDE 0.9 % IV SOLN
INTRAVENOUS | Status: DC
  Administered 2016-07-13: 23:00:00 via INTRAVENOUS

## 2016-07-13 MED ORDER — DOCUSATE SODIUM 283 MG RE ENEM
1.0000 | ENEMA | RECTAL | Status: DC | PRN
Start: 2016-07-13 — End: 2016-07-17
  Filled 2016-07-13: qty 1

## 2016-07-13 MED ORDER — INSULIN ASPART 100 UNIT/ML IV SOLN
10.0000 [IU] | Freq: Once | INTRAVENOUS | Status: AC
  Administered 2016-07-13: 10 [IU] via INTRAVENOUS

## 2016-07-13 MED ORDER — DEXTROSE 50 % IV SOLN: 25.0000 mL | INTRAVENOUS | Status: DC | PRN

## 2016-07-13 NOTE — ED Notes (Signed)
CRITICAL VALUE ALERT  Critical value received:  Troponin 0.07  Date of notification:  07/13/16  Time of notification:  [id number]  Critical value read back:Yes.    Nurse who received alert:  Laurell Josephs RN  MD notified (1st page):  Lita Mains  Time of first page:  1647  MD notified (2nd page):  Time of second page:  Responding MD:  Lita Mains  Time MD responded:  [id number]

## 2016-07-13 NOTE — H&P (Addendum)
History and Physical    Chris [name] [id number] DOB: 05-Nov-1940 DOA: 07/13/2016  PCP: Gar Ponto, MD Consultants:  Has been referred to cardiology Patient coming from: home - lives with wife  Chief Complaint: nausea, hyperglycemia  HPI: Chris [name] is a 75 y.o. male with medical history significant of HTN, HLD, DM, CAD, CHF, afib (not on anticoagulation due to hemorrhagic conversion of CVA) and CVA presenting with severe nausea today.  Missed taking insulin x 1 dose today, maybe not a full dose yesterday.  Glucose fluctuates significantly, staying high much of the time but sometimes very low in the middle of the night and has to eat to bring it up.  Nausea started today.  No emesis, just nausea.  Also weak, tired - just sits around because he doesn't have the energy (his wife seems very frustrated and reports that he used to help around the house and mow the lawn etc but now just lies around all the time).  This has been going on for weeks.  Chronic constipation.  Polyuria.  Chronic LE edema with ulcerations that drain clear fluid requiring his wife to change dressings at least twice daily.   ED Course: Per Dr. Lita Mains: Patient clinically is fluid overloaded. Abdominal distention question edema versus constipation versus obstruction. Worsening renal failure and mild hyperkalemia. Patient also has significantly elevated blood glucose. We'll give Lasix and insulin.  Discussed with hospitalist will admit to telemetry observation bed.  Review of Systems: As per HPI; otherwise 10 point review of systems reviewed and negative.   Ambulatory Status:  ambulates with a cane, but has balance issues  Past Medical History:  Diagnosis Date  . Acute cerebrovascular accident [facility])    presumed embolic from his atrial fibrillation, with hemorrhagic conbersion  . Acute renal insufficiency    resolved  . Atrial fibrillation (HCC)    not candidate for chronic anticoagulation with Coumadin given  chrronic epistaxis as well as hemorrhagic conversion of his acute cva   . CHF (congestive heart failure) ([place])   . Complication of anesthesia   . Diabetes mellitus   . Hyperlipidemia   . Hypertension   . Ischemic cardiomyopathy    EF of 45-50% - posterior & inferior walls are severly hypokinetic  . Macrocytosis    normal B12 & folate levels  . Myocardial infarct   . Pleural effusion 10/05/2013  . PONV (postoperative nausea and vomiting)   . Shortness of breath   . Tubular adenoma     Past Surgical History:  Procedure Laterality Date  . CARDIAC SURGERY    . CHEST TUBE INSERTION Right 10/31/2013   Procedure: INSERTION PLEURAL DRAINAGE CATHETER;  Surgeon: Melrose Nakayama, MD;  Location: [facility];  Service: Thoracic;  Laterality: Right;  . CHOLECYSTECTOMY  2008  . COLONOSCOPY N/A 12/23/2012   Dr. Gala Romney: multiple rectal and colonic polyps removed, tubular adenomas. needs surveillance April 2017.   Marland Kitchen CORONARY ARTERY BYPASS GRAFT    . ESOPHAGOGASTRODUODENOSCOPY (EGD) WITH ESOPHAGEAL DILATION N/A 10/27/2012   Dr. Gala Romney: distal esophageal diverticulum, non-complete Schatzki's ring s/p dilation, distal esophageal nodule with benign path, negative Barrett's  . PLEURAL EFFUSION DRAINAGE Right 10/31/2013   Procedure: DRAINAGE OF PLEURAL EFFUSION;  Surgeon: Melrose Nakayama, MD;  Location: [facility];  Service: Thoracic;  Laterality: Right;  . TONSILLECTOMY    . VIDEO ASSISTED THORACOSCOPY Right 10/31/2013   Procedure: VIDEO ASSISTED THORACOSCOPY;  Surgeon: Melrose Nakayama, MD;  Location: [facility];  Service: Thoracic;  Laterality:  Right;    Social History   Social History  . Marital status: Married    Spouse name: N/A  . Number of children: N/A  . Years of education: N/A   Occupational History  . retired    Social History Main Topics  . Smoking status: Never Smoker  . Smokeless tobacco: Never Used  . Alcohol use No  . Drug use: No  . Sexual activity: Not on file   Other Topics Concern   . Not on file   Social History Narrative  . No narrative on file    Allergies  Allergen Reactions  . Ciprofloxacin Other (See Comments)    Sores all on legs.   . Finasteride     Affected  Patients testicles  . Lisinopril Other (See Comments)    Kidney damage    . Metoclopramide     Jerking all over  . Protonix [Pantoprazole Sodium]     abd pain  . Xanax [Alprazolam] Other (See Comments)    Not in right state of mind.     Family History  Problem Relation Age of Onset  . Colon polyps Sister   . Colon polyps Sister   . Colon cancer Neg Hx     Prior to Admission medications   Medication Sig Start Date End Date Taking? Authorizing Provider  aspirin EC 81 MG tablet Take 81 mg by mouth daily.   Yes Historical Provider, MD  atorvastatin (LIPITOR) 40 MG tablet Take 40 mg by mouth daily at 6 PM.  01/07/14  Yes Historical Provider, MD  Cholecalciferol (VITAMIN D-3) 5000 UNITS TABS Take 5,000 Units by mouth daily.    Yes Historical Provider, MD  furosemide (LASIX) 40 MG tablet Take 1 tablet (40 mg total) by mouth 2 (two) times daily. Patient taking differently: Take 80 mg by mouth 2 (two) times daily as needed for fluid.  04/10/16  Yes Herminio Commons, MD  insulin detemir (LEVEMIR) 100 UNIT/ML injection Inject 40-50 Units into the skin every morning.    Yes Historical Provider, MD  LORazepam (ATIVAN) 0.5 MG tablet Take 0.5 mg by mouth 2 (two) times daily as needed for anxiety.   Yes Historical Provider, MD  metoprolol (LOPRESSOR) 50 MG tablet Take 50 mg by mouth 2 (two) times daily.   Yes Historical Provider, MD  Multiple Vitamin (MULTIVITAMIN WITH MINERALS) TABS tablet Take 1 tablet by mouth daily.   Yes Historical Provider, MD  nitroGLYCERIN (NITROSTAT) 0.4 MG SL tablet Place 1 tablet (0.4 mg total) under the tongue every 5 (five) minutes as needed for chest pain. 04/10/16  Yes Herminio Commons, MD  ondansetron (ZOFRAN) 4 MG tablet Take 4 mg by mouth every 8 (eight) hours as  needed for nausea or vomiting.   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   Yes Historical Provider, MD  ranitidine (ZANTAC) 150 MG tablet Take 150 mg by mouth 2 (two) times daily. Reported on 03/07/2016   Yes Historical Provider, MD  triamcinolone cream (KENALOG) 0.1 % APPLY TO AFFECTED AREA DAILY AS DIRECTED Patient taking differently: Apply 1 application topically daily as needed (for rash).  03/29/16  Yes Orvan Falconer, MD  alum & mag hydroxide-simeth (MAALOX/MYLANTA) 200-200-20 MG/5ML suspension Take 30 mLs by mouth daily as needed for indigestion or heartburn.     Historical Provider, MD    Physical Exam:  Vitals:   07/13/16 1900 07/13/16 1915 07/13/16 1930 07/13/16 1956  BP: 152/75  132/82 135/73  Pulse:  94 91 96  Resp: 15 15 17 17   Temp:    97.7 F (36.5 C)  TempSrc:    Oral  SpO2:  96% 93% 96%  Weight:    97.3 kg (214 lb 8 oz)  Height:         General:  Appears calm but lackluster and is NAD Eyes:  PERRL, EOMI, normal lids, iris ENT:  grossly normal hearing, lips & tongue, mmm Neck:  no LAD, masses or thyromegaly Cardiovascular:  RRR, no m/r/g. No LE edema.  Respiratory:  CTA bilaterally, no w/r/r. Normal respiratory effort. Abdomen:  soft, ntnd, NABS Skin:  Mild B symmetric LE edema and erythema with multiple ulcerations on the dorsal aspects of his lower legs Musculoskeletal:  grossly normal tone BUE/BLE, good ROM, no bony abnormality Psychiatric:  blunted affect, depressed mood, speech somewhat halting and sparse, AOx3 Neurologic:  CN 2-12 grossly intact, moves all extremities in coordinated fashion, sensation intact  Labs on Admission: I have personally reviewed following labs and imaging studies  CBC:  Recent Labs Lab 07/13/16 1418  WBC 5.6  NEUTROABS 4.3  HGB 10.6*  HCT 32.9*  MCV 108.2*  PLT 123456*   Basic Metabolic  Panel:  Recent Labs Lab 07/13/16 1418  NA 131*  K 5.3*  CL 92*  CO2 29  GLUCOSE 599*  BUN 70*  CREATININE 3.18*  CALCIUM 8.8*   GFR: Estimated Creatinine Clearance: 23 mL/min (by C-G formula based on SCr of 3.18 mg/dL (H)). Liver Function Tests:  Recent Labs Lab 07/13/16 1418  AST 35  ALT 18  ALKPHOS 139*  BILITOT 1.9*  PROT 7.5  ALBUMIN 3.3*   No results for input(s): LIPASE, AMYLASE in the last 168 hours. No results for input(s): AMMONIA in the last 168 hours. Coagulation Profile: No results for input(s): INR, PROTIME in the last 168 hours. Cardiac Enzymes:  Recent Labs Lab 07/13/16 1418  TROPONINI 0.07*   BNP (last 3 results) No results for input(s): PROBNP in the last 8760 hours. HbA1C: No results for input(s): HGBA1C in the last 72 hours. CBG:  Recent Labs Lab 07/13/16 1808  GLUCAP 502*   Lipid Profile: No results for input(s): CHOL, HDL, LDLCALC, TRIG, CHOLHDL, LDLDIRECT in the last 72 hours. Thyroid Function Tests: No results for input(s): TSH, T4TOTAL, FREET4, T3FREE, THYROIDAB in the last 72 hours. Anemia Panel: No results for input(s): VITAMINB12, FOLATE, FERRITIN, TIBC, IRON, RETICCTPCT in the last 72 hours. Urine analysis:    Component Value Date/Time   COLORURINE YELLOW 07/13/2016 [place] 07/13/2016 1355   LABSPEC <1.005 (L) 07/13/2016 1355   PHURINE 6.5 07/13/2016 1355   GLUCOSEU >1000 (A) 07/13/2016 1355   HGBUR NEGATIVE 07/13/2016 1355   BILIRUBINUR NEGATIVE 07/13/2016 1355   KETONESUR NEGATIVE 07/13/2016 1355   PROTEINUR NEGATIVE 07/13/2016 1355   UROBILINOGEN 0.2 10/27/2013 1136   NITRITE NEGATIVE 07/13/2016 1355   LEUKOCYTESUR NEGATIVE 07/13/2016 1355    Creatinine Clearance: Estimated Creatinine Clearance: 23 mL/min (by C-G formula based on SCr of 3.18 mg/dL (H)).  Sepsis Labs: @LABRCNTIP (procalcitonin:4,lacticidven:4) )No results found for this or any previous visit (from the past 240 hour(s)).    Radiological Exams on Admission: Dg Chest 2 View  Result Date: 07/13/2016 CLINICAL DATA:  Pulmonary edema, CHF, diabetes and atrial fibrillation EXAM: CHEST  2 VIEW COMPARISON:  04/09/2016 FINDINGS: Cardiomegaly again evident and prior coronary  bypass changes. Improvement in the basilar interstitial edema pattern and small pleural effusions compatible with resolving CHF. Right hemidiaphragm is better visualized. Left lower lobe increased opacity in the retrocardiac region obscures the left hemidiaphragm with a persistent effusion. Left basilar pneumonia not excluded. No pneumothorax. Upper lobes remain clear. Trachea is midline. Degenerative changes of the spine and diffuse osteopenia. IMPRESSION: Improving basilar edema pattern as above. Persistent left lower lobe retrocardiac consolidative opacity and associated effusion concerning for left basilar pneumonia. Electronically Signed   By: Jerilynn Mages.  Shick M.D.   On: 07/13/2016 14:27   Dg Abd 2 Views  Result Date: 07/13/2016 CLINICAL DATA:  Abdominal distension, hyperglycemia EXAM: ABDOMEN - 2 VIEW COMPARISON:  None. FINDINGS: The bowel gas pattern is normal. There is no evidence of free air. No radio-opaque calculi or other significant radiographic abnormality is seen. Postcholecystectomy surgical clips are noted. Suboptimal study due to patient's large body habitus. IMPRESSION: Negative. Electronically Signed   By: Lahoma Crocker M.D.   On: 07/13/2016 16:24    EKG: Independently reviewed.  Afib with rate 92; RBBB, LPFB, with no evidence of acute ischemia  Assessment/Plan Principal Problem:   DM (diabetes mellitus) ([place]) Active Problems:   A-fib (HCC)   HTN (hypertension)   Acute on chronic heart failure of undertermine significance   Anemia in chronic kidney disease   Acute renal failure superimposed on stage 4 chronic kidney disease (HCC)   Elevated troponin I level   Hyperkalemia   Hyponatremia   Adjustment reaction   Stasis dermatitis of both  legs   DM, poorly controlled -Patient reports marked fluctuations in glucose and gives an inconsistent history about this -Will check A1c, suspect that glucose is more chronically elevated than is reported -For now will start [place] (if able to do so on regular medicine floor - if not will try to cover with just SSI) in order to more accurately gauge his 24 hour insulin requirements -Glucostabilizer order set utilized -Patient may eat with additional coverage as needed -Hyponatremia and hyperkalemia are expected to normalize as glucose improves -Suspect that nausea is related to poor glycemic control  AKI on CKD -Current creatinine is 3.18 -This may actually be a progression of his CKD, as his creatinine was pretty consistent in 2.3 range in 2015 and steadily increased to 2.8 range through 2017 -Will hydrate and follow -While he may have a component of volume overload, with his hyperglycemia, he is more likely third spacing despite intravascular depletion (see below) -He may be approaching ESRD, nephrology consult would be reasonable as an outpatient   Acute on chronic heart failure -EF in 4/16 was 45-50% -Pulmonary edema appears better on CXR -Patient without pulmonary complaints (including CAP symptoms, so not treating for that either despite abnormal CXR - may need CT for further evaluation)  Elevated troponin -Chronic troponinemia -His troponin is actually improved from July despite worsening renal failure -No c/o chest pain -No acute changes on EKG -Will not trend further troponins unless he has new complaints  Afib -Rate controlled -Not eligible for anticoagulation despite CHADS2 score >2 -Continue ASA  HTN  -Continue Lopressor -He is not taking ACE/ARB for unclear reasons -If creatinine stabilizes and nephrology approves, could consider addition (although it may be too late at this point)  Anemia -Stable -Will follow  Adjustment reaction -Patient denies  depression - but had a "panic attack" earlier today before ER presentation -His affect is extremely blunted and he reports that he would "rather die than live like  this" but then can't provide further details to describe the issues he is having -If not depression/adjustment issue, he has masked facies and may need evaluation for Parkinson's - he does have difficulty with mobility and chronic fatigue -Consider neuro vs. Psych consult(s)  Stasis dermatitis -Will place [place] boots -PT evaluation requested    DVT prophylaxis: Lovenox  Code Status: DNR - confirmed with patient/family Family Communication: Wife present throughout evaluation Disposition Plan:  Home once clinically improved Consults called: PT Admission status: It is my clinical opinion that referral for OBSERVATION is reasonable and necessary in this patient based on the above information provided. The aforementioned taken together are felt to place the patient at high risk for further clinical deterioration. However it is anticipated that the patient may be medically stable for discharge from the hospital within 24 to 48 hours.     Karmen Bongo MD [facility]  If 7PM-7AM, please contact night-coverage [email or web address] Password [facility]  07/13/2016, 8:49 PM

## 2016-07-13 NOTE — ED Provider Notes (Signed)
[facility] DEPT Provider Note   CSN: [id number] Arrival date & time: 07/13/16  1346     History   Chief Complaint Chief Complaint  Patient presents with  . Nausea    HPI Chris [name] is a 75 y.o. male.  HPI Patient is a poor historian. Presents with increasing fatigue and generalized weakness. States he has nausea but this is worse today. He believes he may have forgotten to take his insulin this morning. He's had ongoing lower extremity swelling. Denies any cough, fever or chills. Has chronic lower extremity ulcerations that are draining clear fluid. States she is changing dressings twice daily. States he's been compliant with his other medication. Past Medical History:  Diagnosis Date  . Acute cerebrovascular accident [facility])    presumed embolic from his atrial fibrillation, with hemorrhagic conbersion  . Acute renal insufficiency    resolved  . Atrial fibrillation (HCC)    not candidate for chronic anticoagulation with Coumadin given chrronic epistaxis as well as hemorrhagic conversion of his acute cva   . CHF (congestive heart failure) ([place])   . Complication of anesthesia   . Diabetes mellitus   . Hyperlipidemia   . Hypertension   . Ischemic cardiomyopathy    EF of 45-50% - posterior & inferior walls are severly hypokinetic  . Macrocytosis    normal B12 & folate levels  . Myocardial infarct   . Pleural effusion 10/05/2013  . PONV (postoperative nausea and vomiting)   . Shortness of breath   . Tubular adenoma     Patient Active Problem List   Diagnosis Date Noted  . Healthcare-associated pneumonia 03/28/2016  . Acute renal failure superimposed on stage 4 chronic kidney disease ([place]) 03/28/2016  . Elevated troponin I level 03/28/2016  . Hyperglycemia 03/28/2016  . History of colonic polyps 12/27/2015  . Chronic kidney disease, stage IV (severe) ([place]) 11/26/2015  . Anemia in chronic kidney disease 11/26/2015  . CHF (congestive heart failure) ([place]) 01/09/2015    . Congestive heart failure ([place]) 01/09/2015  . Dyspepsia 12/01/2013  . Recurrent right pleural effusion 10/31/2013  . Acute renal failure ([place]) 10/07/2013  . AKI (acute kidney injury) ([place]) 10/07/2013  . Chronic kidney disease (CKD), stage IV (severe) ([place]) 08/06/2013  . Diabetic nephropathy ([place]) 08/06/2013  . Acute on chronic heart failure of undertermine significance 08/06/2013  . Pleural effusion 07/27/2013  . CAP (community acquired pneumonia) 07/27/2013  . A-fib ([place]) 07/23/2013  . Pleural effusion, right 07/23/2013  . CVA (cerebral infarction) 07/23/2013  . HTN (hypertension) 07/23/2013  . DM (diabetes mellitus) ([place]) 07/23/2013  . GERD (gastroesophageal reflux disease) 10/18/2012  . Abdominal pain 10/18/2012  . Constipation 10/18/2012    Past Surgical History:  Procedure Laterality Date  . CARDIAC SURGERY    . CHEST TUBE INSERTION Right 10/31/2013   Procedure: INSERTION PLEURAL DRAINAGE CATHETER;  Surgeon: Melrose Nakayama, MD;  Location: [facility];  Service: Thoracic;  Laterality: Right;  . CHOLECYSTECTOMY  2008  . COLONOSCOPY N/A 12/23/2012   Dr. Gala Romney: multiple rectal and colonic polyps removed, tubular adenomas. needs surveillance April 2017.   Marland Kitchen CORONARY ARTERY BYPASS GRAFT    . ESOPHAGOGASTRODUODENOSCOPY (EGD) WITH ESOPHAGEAL DILATION N/A 10/27/2012   Dr. Gala Romney: distal esophageal diverticulum, non-complete Schatzki's ring s/p dilation, distal esophageal nodule with benign path, negative Barrett's  . PLEURAL EFFUSION DRAINAGE Right 10/31/2013   Procedure: DRAINAGE OF PLEURAL EFFUSION;  Surgeon: Melrose Nakayama, MD;  Location: [facility];  Service: Thoracic;  Laterality: Right;  .  TONSILLECTOMY    . VIDEO ASSISTED THORACOSCOPY Right 10/31/2013   Procedure: VIDEO ASSISTED THORACOSCOPY;  Surgeon: Melrose Nakayama, MD;  Location: [facility];  Service: Thoracic;  Laterality: Right;       Home Medications    Prior to Admission medications   Medication Sig Start Date End Date  Taking? Authorizing Provider  aspirin EC 81 MG tablet Take 81 mg by mouth daily.   Yes Historical Provider, MD  atorvastatin (LIPITOR) 40 MG tablet Take 40 mg by mouth daily at 6 PM.  01/07/14  Yes Historical Provider, MD  Cholecalciferol (VITAMIN D-3) 5000 UNITS TABS Take 5,000 Units by mouth daily.    Yes Historical Provider, MD  furosemide (LASIX) 40 MG tablet Take 1 tablet (40 mg total) by mouth 2 (two) times daily. Patient taking differently: Take 80 mg by mouth 2 (two) times daily as needed for fluid.  04/10/16  Yes Herminio Commons, MD  insulin detemir (LEVEMIR) 100 UNIT/ML injection Inject 40-50 Units into the skin every morning.    Yes Historical Provider, MD  LORazepam (ATIVAN) 0.5 MG tablet Take 0.5 mg by mouth 2 (two) times daily as needed for anxiety.   Yes Historical Provider, MD  metoprolol (LOPRESSOR) 50 MG tablet Take 50 mg by mouth 2 (two) times daily.   Yes Historical Provider, MD  Multiple Vitamin (MULTIVITAMIN WITH MINERALS) TABS tablet Take 1 tablet by mouth daily.   Yes Historical Provider, MD  nitroGLYCERIN (NITROSTAT) 0.4 MG SL tablet Place 1 tablet (0.4 mg total) under the tongue every 5 (five) minutes as needed for chest pain. 04/10/16  Yes Herminio Commons, MD  ondansetron (ZOFRAN) 4 MG tablet Take 4 mg by mouth every 8 (eight) hours as needed for nausea or vomiting.   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   Yes Historical Provider, MD  ranitidine (ZANTAC) 150 MG tablet Take 150 mg by mouth 2 (two) times daily. Reported on 03/07/2016   Yes Historical Provider, MD  triamcinolone cream (KENALOG) 0.1 % APPLY TO AFFECTED AREA DAILY AS DIRECTED Patient taking differently: Apply 1 application topically daily as needed (for rash).  03/29/16  Yes Orvan Falconer, MD  alum & mag hydroxide-simeth (MAALOX/MYLANTA) 200-200-20 MG/5ML suspension Take  30 mLs by mouth daily as needed for indigestion or heartburn.     Historical Provider, MD    Family History Family History  Problem Relation Age of Onset  . Colon polyps Sister   . Colon polyps Sister   . Colon cancer Neg Hx     Social History Social History  Substance Use Topics  . Smoking status: Never Smoker  . Smokeless tobacco: Never Used  . Alcohol use No     Allergies   Ciprofloxacin; Finasteride; Lisinopril; Metoclopramide; Protonix [pantoprazole sodium]; and Xanax [alprazolam]   Review of Systems Review of Systems  Constitutional: Positive for activity change and fatigue. Negative for chills and fever.  Respiratory: Negative for cough and shortness of breath.   Cardiovascular: Positive for leg swelling. Negative for chest pain.  Gastrointestinal: Positive for abdominal distention, constipation and nausea. Negative for abdominal pain, diarrhea and vomiting.  Genitourinary: Negative for dysuria and flank pain.  Musculoskeletal: Negative for arthralgias, myalgias, neck pain and neck stiffness.  Skin: Positive for wound. Negative for rash.  Neurological: Positive for weakness (generalized). Negative for dizziness, light-headedness, numbness and headaches.  All other systems reviewed and are negative.    Physical Exam Updated Vital Signs BP 123/83   Pulse 89   Temp 98 F (36.7 C) (Oral)   Resp 18   Ht 6\' 2"  (1.88 m)   Wt 210 lb (95.3 kg)   SpO2 98%   BMI 26.96 kg/m   Physical Exam  Constitutional: He is oriented to person, place, and time. He appears well-developed and well-nourished. No distress.  Chronically ill-appearing  HENT:  Head: Normocephalic and atraumatic.  Mouth/Throat: Oropharynx is clear and moist.  Eyes: EOM are normal. Pupils are equal, round, and reactive to light.  Neck: Normal range of motion. Neck supple. No JVD present.  Cardiovascular: Normal rate.   Murmur heard. Irregular irregular  Pulmonary/Chest: Effort normal.  Crackles in  bilateral bases.  Abdominal: Soft. Bowel sounds are normal. He exhibits distension. There is no tenderness. There is no rebound and no guarding.  Musculoskeletal: Normal range of motion. He exhibits edema. He exhibits no tenderness.  2+ bilateral lower extremity pitting edema. Chronic changes seen with decreased perfusion. Hyperpigmentation. Patient has ulcerations to bilateral ankle and pretibial areas. Mild warmth. Draining clear fluid. Difficult to palpate posterior tibial and dorsalis pedis pulses in bilateral feet. Delayed cap refill. Feet are warm.  Neurological: He is alert and oriented to person, place, and time.  Moving all extremities without focal deficit. Sensation is grossly intact.  Skin: Skin is warm and dry. Capillary refill takes 2 to 3 seconds. No rash noted. No erythema.  Psychiatric: He has a normal mood and affect. His behavior is normal.  Nursing note and vitals reviewed.    ED Treatments / Results  Labs (all labs ordered are listed, but only abnormal results are displayed) Labs Reviewed  CBC WITH DIFFERENTIAL/PLATELET - Abnormal; Notable for the following:       Result Value   RBC 3.04 (*)    Hemoglobin 10.6 (*)    HCT 32.9 (*)    MCV 108.2 (*)    MCH 34.9 (*)    Platelets 149 (*)    All other components within normal limits  COMPREHENSIVE METABOLIC PANEL - Abnormal; Notable for the following:    Sodium 131 (*)    Potassium 5.3 (*)    Chloride 92 (*)    Glucose, Bld 599 (*)    BUN 70 (*)    Creatinine, Ser 3.18 (*)    Calcium 8.8 (*)    Albumin 3.3 (*)    Alkaline Phosphatase 139 (*)    Total Bilirubin 1.9 (*)    GFR calc non Af Amer 18 (*)    GFR calc Af Amer 20 (*)    All other components within normal limits  URINALYSIS, ROUTINE W REFLEX MICROSCOPIC (NOT AT [facility]) - Abnormal; Notable for the following:    Specific Gravity, Urine <1.005 (*)    Glucose, UA >1000 (*)    All other components within normal limits  BRAIN NATRIURETIC PEPTIDE - Abnormal;  Notable for the following:    B Natriuretic Peptide 506.0 (*)    All other components within normal limits  TROPONIN I - Abnormal; Notable for the following:    Troponin I 0.07 (*)    All other components within normal limits  URINE MICROSCOPIC-ADD ON - Abnormal; Notable for the following:    Squamous Epithelial / LPF 0-5 (*)    Bacteria, UA RARE (*)    All other components within normal limits  CBG MONITORING, ED - Abnormal; Notable for  the following:    Glucose-Capillary 502 (*)    All other components within normal limits    EKG  EKG Interpretation  Date/Time:  Sunday July 13 2016 16:38:30 EDT Ventricular Rate:  92 PR Interval:    QRS Duration: 132 QT Interval:  393 QTC Calculation: 487 R Axis:   107 Text Interpretation:  Atrial fibrillation RBBB and LPFB Nonspecific T abnormalities, lateral leads No significant change since last tracing Abnormal ekg Confirmed by Carmin Muskrat  MD [id number]) on 07/13/2016 6:14:14 PM       Radiology Dg Chest 2 View  Result Date: 07/13/2016 CLINICAL DATA:  Pulmonary edema, CHF, diabetes and atrial fibrillation EXAM: CHEST  2 VIEW COMPARISON:  04/09/2016 FINDINGS: Cardiomegaly again evident and prior coronary bypass changes. Improvement in the basilar interstitial edema pattern and small pleural effusions compatible with resolving CHF. Right hemidiaphragm is better visualized. Left lower lobe increased opacity in the retrocardiac region obscures the left hemidiaphragm with a persistent effusion. Left basilar pneumonia not excluded. No pneumothorax. Upper lobes remain clear. Trachea is midline. Degenerative changes of the spine and diffuse osteopenia. IMPRESSION: Improving basilar edema pattern as above. Persistent left lower lobe retrocardiac consolidative opacity and associated effusion concerning for left basilar pneumonia. Electronically Signed   By: Jerilynn Mages.  Shick M.D.   On: 07/13/2016 14:27   Dg Abd 2 Views  Result Date: 07/13/2016 CLINICAL  DATA:  Abdominal distension, hyperglycemia EXAM: ABDOMEN - 2 VIEW COMPARISON:  None. FINDINGS: The bowel gas pattern is normal. There is no evidence of free air. No radio-opaque calculi or other significant radiographic abnormality is seen. Postcholecystectomy surgical clips are noted. Suboptimal study due to patient's large body habitus. IMPRESSION: Negative. Electronically Signed   By: Lahoma Crocker M.D.   On: 07/13/2016 16:24    Procedures Procedures (including critical care time)  Medications Ordered in ED Medications  insulin aspart (novoLOG) injection 10 Units (10 Units Intravenous Given 07/13/16 1633)  furosemide (LASIX) injection 60 mg (60 mg Intravenous Given 07/13/16 1634)     Initial Impression / Assessment and Plan / ED Course  I have reviewed the triage vital signs and the nursing notes.  Pertinent labs & imaging results that were available during my care of the patient were reviewed by me and considered in my medical decision making (see chart for details).  Clinical Course    Patient clinically is fluid overloaded. Abdominal distention question edema versus constipation versus obstruction. Worsening renal failure and mild hyperkalemia. Patient also has significantly elevated blood glucose. We'll give Lasix and insulin.  Discussed with hospitalist will admit to telemetry observation bed. Final Clinical Impressions(s) / ED Diagnoses   Final diagnoses:  Nausea  Generalized edema  Renal insufficiency  Hyperkalemia    New Prescriptions New Prescriptions   No medications on file     Julianne Rice, MD 07/13/16 1827

## 2016-07-13 NOTE — ED Triage Notes (Signed)
Pt reports chronic nausea. Has medication prescribed for nausea, states medication is no longer working. Pt's wife reports constant drainage from both legs.

## 2016-07-14 ENCOUNTER — Inpatient Hospital Stay (HOSPITAL_COMMUNITY): Payer: Commercial Managed Care - HMO

## 2016-07-14 DIAGNOSIS — I252 Old myocardial infarction: Secondary | ICD-10-CM | POA: Diagnosis not present

## 2016-07-14 DIAGNOSIS — I130 Hypertensive heart and chronic kidney disease with heart failure and stage 1 through stage 4 chronic kidney disease, or unspecified chronic kidney disease: Secondary | ICD-10-CM | POA: Diagnosis present

## 2016-07-14 DIAGNOSIS — F4320 Adjustment disorder, unspecified: Secondary | ICD-10-CM | POA: Diagnosis present

## 2016-07-14 DIAGNOSIS — I5033 Acute on chronic diastolic (congestive) heart failure: Secondary | ICD-10-CM | POA: Diagnosis not present

## 2016-07-14 DIAGNOSIS — Z8371 Family history of colonic polyps: Secondary | ICD-10-CM | POA: Diagnosis not present

## 2016-07-14 DIAGNOSIS — E1165 Type 2 diabetes mellitus with hyperglycemia: Secondary | ICD-10-CM | POA: Diagnosis present

## 2016-07-14 DIAGNOSIS — I509 Heart failure, unspecified: Secondary | ICD-10-CM

## 2016-07-14 DIAGNOSIS — I255 Ischemic cardiomyopathy: Secondary | ICD-10-CM | POA: Diagnosis present

## 2016-07-14 DIAGNOSIS — N184 Chronic kidney disease, stage 4 (severe): Secondary | ICD-10-CM | POA: Diagnosis present

## 2016-07-14 DIAGNOSIS — I25810 Atherosclerosis of coronary artery bypass graft(s) without angina pectoris: Secondary | ICD-10-CM | POA: Diagnosis not present

## 2016-07-14 DIAGNOSIS — I5043 Acute on chronic combined systolic (congestive) and diastolic (congestive) heart failure: Secondary | ICD-10-CM | POA: Diagnosis present

## 2016-07-14 DIAGNOSIS — E871 Hypo-osmolality and hyponatremia: Secondary | ICD-10-CM | POA: Diagnosis present

## 2016-07-14 DIAGNOSIS — Z951 Presence of aortocoronary bypass graft: Secondary | ICD-10-CM | POA: Diagnosis not present

## 2016-07-14 DIAGNOSIS — I482 Chronic atrial fibrillation: Secondary | ICD-10-CM | POA: Diagnosis present

## 2016-07-14 DIAGNOSIS — I5023 Acute on chronic systolic (congestive) heart failure: Secondary | ICD-10-CM | POA: Diagnosis not present

## 2016-07-14 DIAGNOSIS — D631 Anemia in chronic kidney disease: Secondary | ICD-10-CM | POA: Diagnosis present

## 2016-07-14 DIAGNOSIS — I878 Other specified disorders of veins: Secondary | ICD-10-CM | POA: Diagnosis present

## 2016-07-14 DIAGNOSIS — E875 Hyperkalemia: Secondary | ICD-10-CM | POA: Diagnosis present

## 2016-07-14 DIAGNOSIS — E785 Hyperlipidemia, unspecified: Secondary | ICD-10-CM | POA: Diagnosis present

## 2016-07-14 DIAGNOSIS — N179 Acute kidney failure, unspecified: Secondary | ICD-10-CM | POA: Diagnosis present

## 2016-07-14 DIAGNOSIS — Z794 Long term (current) use of insulin: Secondary | ICD-10-CM | POA: Diagnosis not present

## 2016-07-14 DIAGNOSIS — Z66 Do not resuscitate: Secondary | ICD-10-CM | POA: Diagnosis present

## 2016-07-14 DIAGNOSIS — Z7982 Long term (current) use of aspirin: Secondary | ICD-10-CM | POA: Diagnosis not present

## 2016-07-14 DIAGNOSIS — I872 Venous insufficiency (chronic) (peripheral): Secondary | ICD-10-CM | POA: Diagnosis present

## 2016-07-14 DIAGNOSIS — I2510 Atherosclerotic heart disease of native coronary artery without angina pectoris: Secondary | ICD-10-CM | POA: Diagnosis present

## 2016-07-14 LAB — CBC
HCT: 32 % — ABNORMAL LOW (ref 39.0–52.0)
Hemoglobin: 10.2 g/dL — ABNORMAL LOW (ref 13.0–17.0)
MCH: 34.1 pg — AB (ref 26.0–34.0)
MCHC: 31.9 g/dL (ref 30.0–36.0)
MCV: 107 fL — ABNORMAL HIGH (ref 78.0–100.0)
PLATELETS: 141 10*3/uL — AB (ref 150–400)
RBC: 2.99 MIL/uL — ABNORMAL LOW (ref 4.22–5.81)
RDW: 15.3 % (ref 11.5–15.5)
WBC: 5.7 10*3/uL (ref 4.0–10.5)

## 2016-07-14 LAB — GLUCOSE, CAPILLARY
GLUCOSE-CAPILLARY: 326 mg/dL — AB (ref 65–99)
GLUCOSE-CAPILLARY: 390 mg/dL — AB (ref 65–99)
GLUCOSE-CAPILLARY: 444 mg/dL — AB (ref 65–99)
GLUCOSE-CAPILLARY: 483 mg/dL — AB (ref 65–99)
GLUCOSE-CAPILLARY: 533 mg/dL — AB (ref 65–99)
GLUCOSE-CAPILLARY: 60 mg/dL — AB (ref 65–99)
Glucose-Capillary: 256 mg/dL — ABNORMAL HIGH (ref 65–99)
Glucose-Capillary: 136 mg/dL — ABNORMAL HIGH (ref 65–99)
Glucose-Capillary: 114 mg/dL — ABNORMAL HIGH (ref 65–99)
Glucose-Capillary: 79 mg/dL (ref 65–99)
Glucose-Capillary: 109 mg/dL — ABNORMAL HIGH (ref 65–99)
Glucose-Capillary: 219 mg/dL — ABNORMAL HIGH (ref 65–99)
Glucose-Capillary: 278 mg/dL — ABNORMAL HIGH (ref 65–99)

## 2016-07-14 LAB — ECHOCARDIOGRAM COMPLETE
AVLVOTPG: 1 mmHg
CHL CUP MV DEC (S): 165
CHL CUP RV SYS PRESS: 58 mmHg
EWDT: 165 ms
FS: 22 % — AB (ref 28–44)
Height: 74 in
IVS/LV PW RATIO, ED: 1.3
LA diam end sys: 45 mm
LA diam index: 1.97 cm/m2
LA vol A4C: 94.4 ml
LA vol index: 46.5 mL/m2
LASIZE: 45 mm
LAVOL: 106 mL
LV dias vol index: 46 mL/m2
LVDIAVOL: 104 mL (ref 62–150)
LVOT SV: 38 mL
LVOT VTI: 12 cm
LVOT area: 3.14 cm2
LVOT diameter: 20 mm
LVOTPV: 59 cm/s
LVSYSVOL: 65 mL — AB (ref 21–61)
LVSYSVOLIN: 28 mL/m2
MV Peak grad: 3 mmHg
MVPKEVEL: 84.4 m/s
PW: 10.1 mm — AB (ref 0.6–1.1)
RV TAPSE: 5.53 mm
Reg peak vel: 355 cm/s
Simpson's disk: 38
Stroke v: 40 ml
TRMAXVEL: 355 cm/s
Weight: 3462.4 oz

## 2016-07-14 LAB — BASIC METABOLIC PANEL
Anion gap: 9 (ref 5–15)
BUN: 69 mg/dL — ABNORMAL HIGH (ref 6–20)
CALCIUM: 8.8 mg/dL — AB (ref 8.9–10.3)
CO2: 29 mmol/L (ref 22–32)
CREATININE: 2.99 mg/dL — AB (ref 0.61–1.24)
Chloride: 98 mmol/L — ABNORMAL LOW (ref 101–111)
GFR calc Af Amer: 22 mL/min — ABNORMAL LOW (ref >60)
GFR calc non Af Amer: 19 mL/min — ABNORMAL LOW (ref >60)
GLUCOSE: 224 mg/dL — AB (ref 65–99)
Potassium: 3.5 mmol/L (ref 3.5–5.1)
Sodium: 136 mmol/L (ref 135–145)

## 2016-07-14 LAB — MRSA PCR SCREENING: MRSA BY PCR: NEGATIVE

## 2016-07-14 MED ORDER — INSULIN ASPART 100 UNIT/ML ~~LOC~~ SOLN
0.0000 [IU] | Freq: Three times a day (TID) | SUBCUTANEOUS | Status: DC
  Administered 2016-07-14: 5 [IU] via SUBCUTANEOUS
  Administered 2016-07-15: 11 [IU] via SUBCUTANEOUS
  Administered 2016-07-15: 15 [IU] via SUBCUTANEOUS
  Administered 2016-07-15: 8 [IU] via SUBCUTANEOUS
  Administered 2016-07-16: 3 [IU] via SUBCUTANEOUS
  Administered 2016-07-16: 2 [IU] via SUBCUTANEOUS
  Administered 2016-07-17 (×3): 3 [IU] via SUBCUTANEOUS

## 2016-07-14 MED ORDER — INSULIN ASPART 100 UNIT/ML ~~LOC~~ SOLN
0.0000 [IU] | Freq: Every day | SUBCUTANEOUS | Status: DC
  Administered 2016-07-14: 3 [IU] via SUBCUTANEOUS

## 2016-07-14 MED ORDER — FAMOTIDINE 20 MG PO TABS
20.0000 mg | ORAL_TABLET | Freq: Every day | ORAL | Status: DC
  Administered 2016-07-15 – 2016-07-16 (×2): 20 mg via ORAL
  Filled 2016-07-14 (×2): qty 1

## 2016-07-14 MED ORDER — INSULIN DETEMIR 100 UNIT/ML ~~LOC~~ SOLN
40.0000 [IU] | Freq: Every morning | SUBCUTANEOUS | Status: DC
  Administered 2016-07-15 – 2016-07-17 (×3): 40 [IU] via SUBCUTANEOUS
  Filled 2016-07-14 (×6): qty 0.4

## 2016-07-14 MED ORDER — FUROSEMIDE 10 MG/ML IJ SOLN
80.0000 mg | Freq: Two times a day (BID) | INTRAMUSCULAR | Status: DC
  Administered 2016-07-14 – 2016-07-17 (×7): 80 mg via INTRAVENOUS
  Filled 2016-07-14 (×7): qty 8

## 2016-07-14 MED ORDER — INSULIN ASPART 100 UNIT/ML ~~LOC~~ SOLN
4.0000 [IU] | Freq: Three times a day (TID) | SUBCUTANEOUS | Status: DC
  Administered 2016-07-14 – 2016-07-17 (×11): 4 [IU] via SUBCUTANEOUS

## 2016-07-14 NOTE — Progress Notes (Signed)
Inpatient Diabetes Program Recommendations  AACE/ADA: New Consensus Statement on Inpatient Glycemic Control (2015)  Target Ranges:  Prepandial:   less than 140 mg/dL      Peak postprandial:   less than 180 mg/dL (1-2 hours)      Critically ill patients:  140 - 180 mg/dL   Lab Results  Component Value Date   GLUCAP 109 (H) 07/14/2016   HGBA1C 8.8 (H) 03/28/2016    Review of Glycemic Control  Diabetes history: DM 2 Outpatient Diabetes medications: Levemir 40-50 units Daily Current orders for Inpatient glycemic control: Levemir 40, Novolog Moderate + HS + Novolog 4 units TID meal coverage  A1c in process  Inpatient Diabetes Program Recommendations:  Patient is a [facility] patient, Placed referral for [facility] follow up.A1c in process. Admitted with hyperglycemia. Will watch patient on current SQ regimen.  Thanks,  Tama Headings RN, MSN, [facility] Inpatient Diabetes Coordinator Team Pager [phone] (8a-5p)

## 2016-07-14 NOTE — Evaluation (Signed)
Physical Therapy Evaluation Patient Details Name: Chris [name] MRN: [id number] DOB: [birth year] Today's Date: 07/14/2016   History of Present Illness  75 y.o. male with medical history significant of HTN, HLD, DM, CAD, CHF, afib (not on anticoagulation due to hemorrhagic conversion of CVA) and CVA presenting with severe nausea today.  Missed taking insulin x 1 dose today, maybe not a full dose yesterday.  Glucose fluctuates significantly, staying high much of the time but sometimes very low in the middle of the night and has to eat to bring it up.  Nausea started today.  No emesis, just nausea.  Also weak, tired - just sits around because he doesn't have the energy (his wife seems very frustrated and reports that he used to help around the house and mow the lawn etc but now just lies around all the time).  This has been going on for weeks.  Chronic constipation.  Polyuria.  Chronic LE edema with ulcerations that drain clear fluid requiring his wife to change dressings at least twice daily..  Dx:  poorly controlled DM,  AKI on CKD, and acute on chronic CHF.      Clinical Impression  Pt received in bed, and is marginally agreeable to PT evaluation, wife is present.  Pt is B LE dressings needing to be changed - informed RN at end of tx.  Pt normally ambulates around the house with straight cane.  He does not venture out into the community often, and when he does, he only sits in the car and waits for his wife.  Pt has not had any falls in the past 6 months, however, his gait today was unsteady.  Pt was able to ambulate 52ft with cane, but also significantly leaned on the IV pole, and requires Min guard for safety.  PT discussed recommendations for him to use the RW, however pt straight up said "no, you're wrong."  Educated pt on reasons for professional recommendations, but pt continues to state that he will only use the cane, and there is always something for him to hold to in the house.  Pt is recommended  for HHPT to improve mobility, and he may need some HH nursing due to wounds.      Follow Up Recommendations Home health PT    Equipment Recommendations  None recommended by PT    Recommendations for Other Services       Precautions / Restrictions Precautions Precautions: Fall Precaution Comments: Due to increased immobility at home, as well as decreased gait speed.   Restrictions Weight Bearing Restrictions: No      Mobility  Bed Mobility Overal bed mobility: Needs Assistance Bed Mobility: Supine to Sit     Supine to sit: HOB elevated;Supervision (increased time. )        Transfers Overall transfer level: Needs assistance Equipment used: Straight cane Transfers: Sit to/from Stand Sit to Stand: Min guard            Ambulation/Gait Ambulation/Gait assistance: Min guard Ambulation Distance (Feet): 40 Feet Assistive device: Straight cane (and IV pole) Gait Pattern/deviations: Step-to pattern;Wide base of support;Trunk flexed Gait velocity: Pt demonstrates significantly slowed gait speed, and therefore is at high risk for falls.  Gait velocity interpretation: <1.8 ft/sec, indicative of risk for recurrent falls General Gait Details: Pt demonstrates poor quality of gait using cane in one hand and the IV pole in the other.   Stairs            Emergency planning/management officer  Modified Rankin (Stroke Patients Only)       Balance Overall balance assessment: Needs assistance Sitting-balance support: Bilateral upper extremity supported Sitting balance-Leahy Scale: Good     Standing balance support: Bilateral upper extremity supported Standing balance-Leahy Scale: Poor                               Pertinent Vitals/Pain Pain Assessment: No/denies pain    Home Living   Living Arrangements: Spouse/significant other Available Help at Discharge: Family (son, and dtr in [employer] with groceries.  ) Type of Home: House Home Access: Stairs to enter    [employer] of Steps: 3-4 steps.  Home Layout: One level Home Equipment: Shower seat - built in;Grab bars - tub/shower;Walker - 2 wheels;Cane - quad;Cane - single point      Prior Function     Gait / Transfers Assistance Needed: mod (I) with straight cane.   ADL's / Homemaking Assistance Needed: Pt states that he is independent with dressing and bathing.  Pt requires assistance from wife to dress LE woundes.  Pt does not drive.  Pt occasionally will go with wife out into the community, but he stays in the car.         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  LE's with bandages from the knees down.      Exercises     Assessment/Plan    PT Assessment Patient needs continued PT services  PT Problem List Decreased strength;Decreased activity tolerance;Decreased balance;Decreased range of motion;Decreased mobility;Decreased knowledge of use of DME;Decreased safety awareness;Decreased knowledge of precautions;Decreased skin integrity;Obesity          PT Treatment Interventions DME instruction;Gait training;Functional mobility training;Therapeutic activities;Therapeutic exercise;Balance training;Stair training;Patient/family education    PT Goals (Current goals can be found in the Care Plan section)  Acute Rehab PT Goals Patient Stated Goal: Pt wants to go home.  PT Goal Formulation: With patient/family Time For Goal Achievement: 07/21/16 Potential to Achieve Goals: Fair    Frequency Min 3X/week   Barriers to discharge Decreased caregiver support Wife - who  has limited mobility due to fall back in July where she broke B UE's and she still uses a cane.     Co-evaluation               End of Session Equipment Utilized During Treatment: Gait belt Activity Tolerance: Patient tolerated treatment well Patient left: in bed;with call bell/phone within reach Nurse Communication: Mobility status Doroteo Bradford, RN notified of pt's mobility status)    Functional Assessment Tool Used: [employer] "6-clicks"  Functional Limitation: Mobility: Walking and moving around Mobility: Walking and Moving Around Current Status [phone]): At least 40 percent but less than 60 percent impaired, limited or restricted Mobility: Walking and Moving Around Goal Status [phone]): At least 20 percent but less than 40 percent impaired, limited or restricted    Time: [id number] PT Time Calculation (min) (ACUTE  ONLY): 32 min   Charges:   PT Evaluation $PT Eval Low Complexity: 1 Procedure PT Treatments $Gait Training: 8-22 mins   PT G Codes:   PT G-Codes **NOT FOR INPATIENT CLASS** Functional Assessment Tool Used: [employer] "6-clicks"  Functional Limitation: Mobility: Walking and moving around Mobility: Walking and Moving Around Current Status [phone]): At least 40 percent but less than 60 percent impaired, limited or restricted Mobility: Walking and Moving Around Goal Status [phone]): At least 20 percent but less than 40 percent impaired, limited or restricted    Beth Keiji Melland, PT, DPT X: [phone]

## 2016-07-14 NOTE — Clinical Social Work Note (Signed)
CSW received consult for medication assistance. CM notified. CSW will sign off, but can be reconsulted if needed.  Yajahira Tison, LCSW [phone] 

## 2016-07-14 NOTE — Progress Notes (Addendum)
PROGRESS NOTE    Chris [name]  [id number] DOB: June 09, 1941 DOA: 07/13/2016 PCP: Gar Ponto, MD     Brief Narrative:  75 y/o man admitted on 10/22 from home with elevated CBGs and LE edema.    Assessment & Plan:   Principal Problem:   DM (diabetes mellitus) ([place]) Active Problems:   A-fib (HCC)   HTN (hypertension)   Acute on chronic heart failure of undertermine significance   Anemia in chronic kidney disease   Acute renal failure superimposed on stage 4 chronic kidney disease (HCC)   Elevated troponin I level   Hyperkalemia   Hyponatremia   Adjustment reaction   Stasis dermatitis of both legs   Acute on chronic diastolic CHF (congestive heart failure) ([place])   Poorly controlled DM II -Will transition off glucostabilizer today. -Place on SSI, hold am dose of lantus as he was hypoglycemic, resume lantus in am.  Acute on Chronic Systolic CHF -Markedly volume overloaded. -Start lasix 80 mg IV BID. -Strict Is and Os, daily weights, strive for negative fluid balance. -ECHO 4/16: EF 45-50%, no mention of diastolic function. -Continue BB. Not on ACE-I due to advanced CKD.  Acute on CKD Stage III-IV -Improved with diuresis. -Recheck renal function in am.  Elevated troponin -Is chronic and likely due to CHF.  A Fib -Rate controlled. -Not good candidate for anticoagulation.  HTN -Well controlled. -Continue current medications.  Stasis Dermatitis -Unnaboots to be placed.   DVT prophylaxis: lovenox Code Status: DNR Family Communication: patient only Disposition Plan: pending clinical course, anticipate 2-3 more days in house  Consultants:   None  Procedures:   None  Antimicrobials:   None    Subjective: Grumpy, says he is going to sue the hospital for 10 million dollars because we are not getting the fluid off his legs fast enough.  Objective: Vitals:   07/14/16 1100 07/14/16 1200 07/14/16 1207 07/14/16 1300  BP: 116/60 131/73  128/71    Pulse: 88 78 79 (!) 104  Resp: (!) 21 12 17 20   Temp:   97.6 F (36.4 C)   TempSrc:   Oral   SpO2: 99% 97% 97% 95%  Weight:      Height:        Intake/Output Summary (Last 24 hours) at 07/14/16 1425 Last data filed at 07/14/16 1406  Gross per 24 hour  Intake           1470.3 ml  Output             1050 ml  Net            420.3 ml   Filed Weights   07/13/16 1956 07/13/16 2300 07/14/16 0500  Weight: 97.3 kg (214 lb 8 oz) 97.8 kg (215 lb 9.8 oz) 97.8 kg (215 lb 9.8 oz)    Examination:  General exam: Alert, awake, oriented x 3 Respiratory system: Clear to auscultation. Respiratory effort normal. Cardiovascular system:RRR. No murmurs, rubs, gallops. Gastrointestinal system: Abdomen is nondistended, soft and nontender. No organomegaly or masses felt. Normal bowel sounds heard. Central nervous system: Alert and oriented. No focal neurological deficits. Extremities: 4++ edema bilaterally up to hips, +pedal pulses Skin: No rashes, lesions or ulcers Psychiatry: Judgement and insight appear normal. Mood & affect appropriate.     Data Reviewed: I have personally reviewed following labs and imaging studies  CBC:  Recent Labs Lab 07/13/16 1418 07/14/16 0527  WBC 5.6 5.7  NEUTROABS 4.3  --   HGB 10.6* 10.2*  HCT 32.9* 32.0*  MCV 108.2* 107.0*  PLT 149* [id number]*   Basic Metabolic Panel:  Recent Labs Lab 07/13/16 1418 07/14/16 0527  NA 131* 136  K 5.3* 3.5  CL 92* 98*  CO2 29 29  GLUCOSE 599* 224*  BUN 70* 69*  CREATININE 3.18* 2.99*  CALCIUM 8.8* 8.8*   GFR: Estimated Creatinine Clearance: 24.1 mL/min (by C-G formula based on SCr of 2.99 mg/dL (H)). Liver Function Tests:  Recent Labs Lab 07/13/16 1418  AST 35  ALT 18  ALKPHOS 139*  BILITOT 1.9*  PROT 7.5  ALBUMIN 3.3*   No results for input(s): LIPASE, AMYLASE in the last 168 hours. No results for input(s): AMMONIA in the last 168 hours. Coagulation Profile: No results for input(s): INR, PROTIME in the  last 168 hours. Cardiac Enzymes:  Recent Labs Lab 07/13/16 1418  TROPONINI 0.07*   BNP (last 3 results) No results for input(s): PROBNP in the last 8760 hours. HbA1C: No results for input(s): HGBA1C in the last 72 hours. CBG:  Recent Labs Lab 07/14/16 0624 07/14/16 0722 07/14/16 0836 07/14/16 0953 07/14/16 1204  GLUCAP 136* 114* 79 60* 109*   Lipid Profile: No results for input(s): CHOL, HDL, LDLCALC, TRIG, CHOLHDL, LDLDIRECT in the last 72 hours. Thyroid Function Tests: No results for input(s): TSH, T4TOTAL, FREET4, T3FREE, THYROIDAB in the last 72 hours. Anemia Panel: No results for input(s): VITAMINB12, FOLATE, FERRITIN, TIBC, IRON, RETICCTPCT in the last 72 hours. Urine analysis:    Component Value Date/Time   COLORURINE YELLOW 07/13/2016 [place] 07/13/2016 1355   LABSPEC <1.005 (L) 07/13/2016 1355   PHURINE 6.5 07/13/2016 1355   GLUCOSEU >1000 (A) 07/13/2016 1355   HGBUR NEGATIVE 07/13/2016 1355   BILIRUBINUR NEGATIVE 07/13/2016 1355   KETONESUR NEGATIVE 07/13/2016 1355   PROTEINUR NEGATIVE 07/13/2016 1355   UROBILINOGEN 0.2 10/27/2013 1136   NITRITE NEGATIVE 07/13/2016 1355   LEUKOCYTESUR NEGATIVE 07/13/2016 1355   Sepsis Labs: @LABRCNTIP (procalcitonin:4,lacticidven:4)  ) Recent Results (from the past 240 hour(s))  MRSA PCR Screening     Status: None   Collection Time: 07/13/16 11:51 PM  Result Value Ref Range Status   MRSA by PCR NEGATIVE NEGATIVE Final    Comment:        The GeneXpert MRSA Assay (FDA approved for NASAL specimens only), is one component of a comprehensive MRSA colonization surveillance program. It is not intended to diagnose MRSA infection nor to guide or monitor treatment for MRSA infections.          Radiology Studies: Dg Chest 2 View  Result Date: 07/13/2016 CLINICAL DATA:  Pulmonary edema, CHF, diabetes and atrial fibrillation EXAM: CHEST  2 VIEW COMPARISON:  04/09/2016 FINDINGS: Cardiomegaly  again evident and prior coronary bypass changes. Improvement in the basilar interstitial edema pattern and small pleural effusions compatible with resolving CHF. Right hemidiaphragm is better visualized. Left lower lobe increased opacity in the retrocardiac region obscures the left hemidiaphragm with a persistent effusion. Left basilar pneumonia not excluded. No pneumothorax. Upper lobes remain clear. Trachea is midline. Degenerative changes of the spine and diffuse osteopenia. IMPRESSION: Improving basilar edema pattern as above. Persistent left lower lobe retrocardiac consolidative opacity and associated effusion concerning for left basilar pneumonia. Electronically Signed   By: Jerilynn Mages.  Shick M.D.   On: 07/13/2016 14:27   Dg Abd 2 Views  Result Date: 07/13/2016 CLINICAL DATA:  Abdominal distension, hyperglycemia EXAM: ABDOMEN - 2 VIEW COMPARISON:  None. FINDINGS: The bowel gas pattern is normal. There  is no evidence of free air. No radio-opaque calculi or other significant radiographic abnormality is seen. Postcholecystectomy surgical clips are noted. Suboptimal study due to patient's large body habitus. IMPRESSION: Negative. Electronically Signed   By: Lahoma Crocker M.D.   On: 07/13/2016 16:24        Scheduled Meds: . aspirin EC  81 mg Oral Daily  . atorvastatin  40 mg Oral q1800  . docusate sodium  100 mg Oral BID  . enoxaparin (LOVENOX) injection  30 mg Subcutaneous Q24H  . famotidine  20 mg Oral BID  . furosemide  80 mg Intravenous Q12H  . insulin aspart  0-15 Units Subcutaneous TID WC  . insulin aspart  0-5 Units Subcutaneous QHS  . insulin aspart  4 Units Subcutaneous TID WC  . insulin detemir  40 Units Subcutaneous q morning - 10a  . metoprolol  50 mg Oral BID  . sodium chloride flush  3 mL Intravenous Q12H   Continuous Infusions: . insulin (NOVOLIN-R) infusion 3.6 Units/hr (07/14/16 0624)     LOS: 0 days    Time spent: 25 minutes. Greater than 50% of this time was spent in direct  contact with the patient coordinating care.     Lelon Frohlich, MD [facility] Pager [phone]  If 7PM-7AM, please contact night-coverage [email or web address] Password [email or web address] 07/14/2016, 2:25 PM

## 2016-07-14 NOTE — Progress Notes (Signed)
*  PRELIMINARY RESULTS* Echocardiogram 2D Echocardiogram has been performed.  Chris [name] 07/14/2016, 4:17 PM

## 2016-07-15 ENCOUNTER — Encounter (HOSPITAL_COMMUNITY): Payer: Self-pay | Admitting: Cardiology

## 2016-07-15 ENCOUNTER — Other Ambulatory Visit: Payer: Self-pay | Admitting: *Deleted

## 2016-07-15 DIAGNOSIS — E1165 Type 2 diabetes mellitus with hyperglycemia: Secondary | ICD-10-CM

## 2016-07-15 DIAGNOSIS — N179 Acute kidney failure, unspecified: Secondary | ICD-10-CM

## 2016-07-15 DIAGNOSIS — N184 Chronic kidney disease, stage 4 (severe): Secondary | ICD-10-CM

## 2016-07-15 DIAGNOSIS — I2510 Atherosclerotic heart disease of native coronary artery without angina pectoris: Secondary | ICD-10-CM | POA: Insufficient documentation

## 2016-07-15 DIAGNOSIS — I5023 Acute on chronic systolic (congestive) heart failure: Secondary | ICD-10-CM

## 2016-07-15 DIAGNOSIS — I482 Chronic atrial fibrillation: Secondary | ICD-10-CM

## 2016-07-15 DIAGNOSIS — I25810 Atherosclerosis of coronary artery bypass graft(s) without angina pectoris: Secondary | ICD-10-CM

## 2016-07-15 LAB — GLUCOSE, CAPILLARY
GLUCOSE-CAPILLARY: 363 mg/dL — AB (ref 65–99)
GLUCOSE-CAPILLARY: 290 mg/dL — AB (ref 65–99)
Glucose-Capillary: 344 mg/dL — ABNORMAL HIGH (ref 65–99)
Glucose-Capillary: 178 mg/dL — ABNORMAL HIGH (ref 65–99)

## 2016-07-15 LAB — BASIC METABOLIC PANEL
Anion gap: 11 (ref 5–15)
BUN: 68 mg/dL — AB (ref 6–20)
CALCIUM: 8.7 mg/dL — AB (ref 8.9–10.3)
CO2: 29 mmol/L (ref 22–32)
CREATININE: 3.1 mg/dL — AB (ref 0.61–1.24)
Chloride: 95 mmol/L — ABNORMAL LOW (ref 101–111)
GFR calc Af Amer: 21 mL/min — ABNORMAL LOW (ref >60)
GFR, EST NON AFRICAN AMERICAN: 18 mL/min — AB (ref >60)
GLUCOSE: 311 mg/dL — AB (ref 65–99)
Potassium: 4 mmol/L (ref 3.5–5.1)
SODIUM: 135 mmol/L (ref 135–145)

## 2016-07-15 LAB — HEMOGLOBIN A1C
HEMOGLOBIN A1C: 9.7 % — AB (ref 4.8–5.6)
Mean Plasma Glucose: 232 mg/dL

## 2016-07-15 MED ORDER — METOPROLOL SUCCINATE ER 50 MG PO TB24
50.0000 mg | ORAL_TABLET | Freq: Two times a day (BID) | ORAL | Status: DC
  Administered 2016-07-15 – 2016-07-17 (×4): 50 mg via ORAL
  Filled 2016-07-15 (×4): qty 1

## 2016-07-15 NOTE — Patient Outreach (Signed)
[facility]) Care Management  07/15/2016  AADON ROUTHIER 1941/06/03 [id number]  Mr. Ashvath Rosberg. Spear is a 75 y.o.male with history diabetes, hypertension, coronary artery disease status post CABG in 2001, ischemic cardiomyopathy, atrial fibrillation, and previous stroke with hemorrhagic conversion. Mr. Blackmon was admitted to the hospital on 07/14/16 with volume overload and acute on chronic systolic heart failure, worsening renal failure, hyperkalemia, and elevated blood glucose.   Mr. Milks's wife noted on admission that Mr. Vonasek has become more confused and has not been keeping track of his blood glucose and has not been taking his insulin. She has also noted worsening shortness of breath and edema. Mrs. Dejonge indicated that she and Mr. Bayani had an argument and she'd not seen him in a few days prior to his admission.   Mr. Tim is referred to [facility] [facility] for care coordination and disease management at discharge.   Plan: We will follow Mr. Gough's progress closely and reach out to him within 72 hours of discharge to begin community case management services.    [place] [facility]  [phone]

## 2016-07-15 NOTE — ED Triage Notes (Addendum)
CARDIOLOGY CONSULT NOTE   Patient ID: Chris [name] MRN: [id number] DOB/AGE: Jan 17, 1941 75 y.o.  Admit Date: 07/13/2016 Referring Physician: [facility] - Erline Hau, MD Primary Physician: Gar Ponto, MD Consulting Cardiologist: Rozann Lesches MD Primary Cardiologist: Kate Sable, MD Reason for Consultation: CHF  Clinical Summary [name] is a 75 y.o.male with history diabetes, hypertension, coronary artery disease status post CABG in 2001, ischemic cardiomyopathy, atrial fibrillation (not anticoagulated), and previous stroke with hemorrhagic conversion. He is now admitted with volume overload and acute on chronic systolic heart failure. He also has evidence of worsening renal failure and hyperkalemia with a significantly elevated blood glucose - states that he has not been taking his insulin at home.  His wife states that he had become more confused, was not keeping track of his blood glucose or taking his insulin. He has had worsening lower extremity edema and some shortness of breath. Apparently he and his wife have had an argument and she has not been around him for a day or 2 prior to him coming to the hospital. History is obtained from current and past medical records as patient is also a poor historian.  On arrival to the emergency room patient's blood pressure is 164/107, heart rate 89, O2 sat 98%, patient was afebrile. Blood glucose was 502, hemoglobin 10.6, hematocrit 32.9, platelets 149. Sodium 131, potassium 5.3, creatinine 3.18. Initial troponin 0.07. Chest x-ray revealed bibasilar edema, consolidative opacity and associated fusion concerning for left basilar pneumonia. EKG revealed atrial fibrillation, right bundle branch block, PVCs.   He has been started back on insulin, also placed on IV Lasix. Net urine output approximately 1100 cc last 24 hours.   Allergies  Allergen Reactions  . Ciprofloxacin Other (See Comments)    Sores all on legs.   .  Finasteride     Affected  Patients testicles  . Lisinopril Other (See Comments)    Kidney damage    . Metoclopramide     Jerking all over  . Protonix [Pantoprazole Sodium]     abd pain  . Xanax [Alprazolam] Other (See Comments)    Not in right state of mind.     Medications Scheduled Medications: . aspirin EC  81 mg Oral Daily  . atorvastatin  40 mg Oral q1800  . docusate sodium  100 mg Oral BID  . enoxaparin (LOVENOX) injection  30 mg Subcutaneous Q24H  . famotidine  20 mg Oral QHS  . furosemide  80 mg Intravenous Q12H  . insulin aspart  0-15 Units Subcutaneous TID WC  . insulin aspart  0-5 Units Subcutaneous QHS  . insulin aspart  4 Units Subcutaneous TID WC  . insulin detemir  40 Units Subcutaneous q morning - 10a  . metoprolol  50 mg Oral BID  . sodium chloride flush  3 mL Intravenous Q12H      PRN Medications: acetaminophen **OR** acetaminophen, alum & mag hydroxide-simeth, calcium carbonate (dosed in mg elemental calcium), camphor-menthol **AND** hydrOXYzine, docusate sodium, feeding supplement (NEPRO CARB STEADY), LORazepam, ondansetron **OR** ondansetron (ZOFRAN) IV, prednisoLONE acetate, sorbitol, zolpidem   Past Medical History:  Diagnosis Date  . Acute cerebrovascular accident [facility])    Presumed embolic from his atrial fibrillation with hemorrhagic conbersion  . Atrial fibrillation (HCC)    Not anticoagulated - chronic epistaxis as well as hemorrhagic conversion of prior stroke  . CAD (coronary artery disease)    Multivessel status post CABG  . Chronic systolic heart failure ([place])   .  Essential hypertension   . Hyperlipidemia   . Ischemic cardiomyopathy    LVEF 35-40%  . Macrocytosis    Normal B12 & folate levels  . Myocardial infarct   . Pleural effusion 10/05/2013  . Tubular adenoma   . Type 2 diabetes mellitus ([place])     Past Surgical History:  Procedure Laterality Date  . CHEST TUBE INSERTION Right 10/31/2013   Procedure: INSERTION PLEURAL  DRAINAGE CATHETER;  Surgeon: Melrose Nakayama, MD;  Location: [place];  Service: Thoracic;  Laterality: Right;  . CHOLECYSTECTOMY  2008  . COLONOSCOPY N/A 12/23/2012   Dr. Gala Romney: multiple rectal and colonic polyps removed, tubular adenomas. needs surveillance April 2017.   Marland Kitchen CORONARY ARTERY BYPASS GRAFT    . ESOPHAGOGASTRODUODENOSCOPY (EGD) WITH ESOPHAGEAL DILATION N/A 10/27/2012   Dr. Gala Romney: distal esophageal diverticulum, non-complete Schatzki's ring s/p dilation, distal esophageal nodule with benign path, negative Barrett's  . PLEURAL EFFUSION DRAINAGE Right 10/31/2013   Procedure: DRAINAGE OF PLEURAL EFFUSION;  Surgeon: Melrose Nakayama, MD;  Location: [place];  Service: Thoracic;  Laterality: Right;  . TONSILLECTOMY    . VIDEO ASSISTED THORACOSCOPY Right 10/31/2013   Procedure: VIDEO ASSISTED THORACOSCOPY;  Surgeon: Melrose Nakayama, MD;  Location: [place];  Service: Thoracic;  Laterality: Right;    Family History  Problem Relation Age of Onset  . Colon polyps Sister   . Colon polyps Sister   . Colon cancer Neg Hx      Social History [name] reports that he has never smoked. He has never used smokeless tobacco. [name] reports that he does not drink alcohol.  Review of Systems Complete review of systems are found to be negative unless outlined in H&P above. Reports chronic leg edema, states that he sees the wound clinic in [place] and has his legs dressed regularly.  Physical Examination Blood pressure 129/67, pulse 94, temperature 97.9 F (36.6 C), temperature source Oral, resp. rate 17, height 6\' 2"  (1.88 m), weight 214 lb 14.4 oz (97.5 kg), SpO2 96 %.  Intake/Output Summary (Last 24 hours) at 07/15/16 1242 Last data filed at 07/15/16 0430  Gross per 24 hour  Intake              370 ml  Output             1300 ml  Net             -930 ml    Telemetry: Atrial fibrillation.  GEN: Elderly male, no acute distress.  HEENT: Conjunctiva and lids normal, oropharynx  clear. Neck: Supple, no elevated JVP or carotid bruits, no thyromegaly. Lungs: Bibasilar crackles without wheezes or cough  Cardiac: Irregular rate and rhythm, distant heart sounds, no S3 or significant systolic murmur, no pericardial rub. Abdomen: Mildly distended, nontender, bowel sounds present, no guarding or rebound. Extremities: Bilateral pretibial  pitting edema, distal pulses 2+. Several open ulcers and sores on legs bilaterally with venous stasis skin changes. Nurses are dressing and applying wet compresses to site.  Skin: Warm and dry. Musculoskeletal: No kyphosis. Neuropsychiatric: Alert and oriented x 2. Affect grossly appropriate.  Prior Cardiac Testing/Procedures  Echocardiogram 07/14/2016: Left ventricle: Septal apical and inferior basal hypokinesis Wall   thickness was increased in a pattern of mild LVH. Systolic   function was moderately reduced. The estimated ejection fraction   was in the range of 35% to 40%. - Left atrium: The atrium was moderately dilated. - Right atrium: The atrium was moderately dilated. - Atrial  septum: No defect or patent foramen ovale was identified. - Pulmonary arteries: PA peak pressure: 58 mm Hg (S). - Pericardium, extracardiac: There was a left pleural effusion.  Lab Results  Basic Metabolic Panel:  Recent Labs Lab 07/13/16 1418 07/14/16 0527 07/15/16 0557  NA 131* 136 135  K 5.3* 3.5 4.0  CL 92* 98* 95*  CO2 29 29 29   GLUCOSE 599* 224* 311*  BUN 70* 69* 68*  CREATININE 3.18* 2.99* 3.10*  CALCIUM 8.8* 8.8* 8.7*    Liver Function Tests:  Recent Labs Lab 07/13/16 1418  AST 35  ALT 18  ALKPHOS 139*  BILITOT 1.9*  PROT 7.5  ALBUMIN 3.3*    CBC:  Recent Labs Lab 07/13/16 1418 07/14/16 0527  WBC 5.6 5.7  NEUTROABS 4.3  --   HGB 10.6* 10.2*  HCT 32.9* 32.0*  MCV 108.2* 107.0*  PLT 149* 141*    Cardiac Enzymes:  Recent Labs Lab 07/13/16 1418  TROPONINI 0.07*    Radiology: Dg Chest 2 View  Result  Date: 07/13/2016 CLINICAL DATA:  Pulmonary edema, CHF, diabetes and atrial fibrillation EXAM: CHEST  2 VIEW COMPARISON:  04/09/2016 FINDINGS: Cardiomegaly again evident and prior coronary bypass changes. Improvement in the basilar interstitial edema pattern and small pleural effusions compatible with resolving CHF. Right hemidiaphragm is better visualized. Left lower lobe increased opacity in the retrocardiac region obscures the left hemidiaphragm with a persistent effusion. Left basilar pneumonia not excluded. No pneumothorax. Upper lobes remain clear. Trachea is midline. Degenerative changes of the spine and diffuse osteopenia. IMPRESSION: Improving basilar edema pattern as above. Persistent left lower lobe retrocardiac consolidative opacity and associated effusion concerning for left basilar pneumonia. Electronically Signed   By: Jerilynn Mages.  Shick M.D.   On: 07/13/2016 14:27   Dg Abd 2 Views  Result Date: 07/13/2016 CLINICAL DATA:  Abdominal distension, hyperglycemia EXAM: ABDOMEN - 2 VIEW COMPARISON:  None. FINDINGS: The bowel gas pattern is normal. There is no evidence of free air. No radio-opaque calculi or other significant radiographic abnormality is seen. Postcholecystectomy surgical clips are noted. Suboptimal study due to patient's large body habitus. IMPRESSION: Negative. Electronically Signed   By: Lahoma Crocker M.D.   On: 07/13/2016 16:24    ECG: Tracing from 07/13/2016 showed atrial fib ablation and 92 bpm with aberrantly conducted complexes versus PVC, rule out old inferior infarct pattern, decreased R wave progression, nonspecific ST-T wave abnormalities.  Impression and Recommendations  1. Ischemic cardiomyopathy: Echocardiogram reveals worsening LV systolic function with EF 35% to 40% compared with prior assessment. He has begun to diurese on IV lasix. Currently not a candidate for ACE inhibitor or ARB with renal failure. Consider changing to carvedilol. Continue aspirin.  2. Uncontrolled  diabetes: Patient has been forgetful concerning insulin and blood sugar management by report. Somewhat confused per wife, question whether could be component of dementia although not certain about his baseline mental status.  3. Acute on chronic systolic heart failure: Continue IV diuretics, he is beginning to have better diuresis since admission. As stated creatinine has improved minimally to 3.12. Fluid restriction is recommended along with low sodium diet. Not a candidate for Entresto due to renal failure.  4. Chronic atrial fibrillation: Heart rate is not optimally controlled. Consider increasing metoprolol dose versus changing to carvedilol in the setting of reduced ejection fraction. Patient is not on anticoagulation therapy due to hemorrhagic CVA and epistaxis. Continue aspirin.   Signed:  Phill Myron. Lawrence NP [place]  07/15/2016, 12:42 PM Co-Sign MD  Attending note:  Patient seen and examined. Reviewed records and updated the chart. Also modified above note by Ms. Lawrence NP. Mr. Ketterling is a patient of Dr. Bronson Ing presents with hyperglycemia in the face of uncontrolled diabetes mellitus, worsening renal failure, and also volume overload with acute on chronic systolic heart failure. History is somewhat limited, wife indicates that he has been more confused in general. Patient did state that he had not taken his insulin regularly. He does not report any chest pain or palpitations. Has chronic leg edema and ulcerations, reportedly followed in the wound clinic in [place] with regular dressings.  On examination he stated that he was unhappy being in the hospital and wanted to go home. Systolic blood pressure in the 120s to 130s, heart rate 90s to 100s and atrial fibrillation. Approximately 1100 cc out more than him on IV Lasix in the last 24 hours. Lungs exhibit a few crackles at the bases, cardiac exam with irregularly irregular rhythm and no gallop. Abdomen protuberant. Legs show chronic  appearing bilateral edema with venous stasis and scattered ulcerations. Lab work shows creatinine 3.1, potassium 4.0, most recent glucose 311, hemoglobin 10.2. Heme load A1c 9.7. Troponin I 0.07. BNP 506. Echocardiogram performed during this hospital stay revealed LVEF 35-40% range which is reduced compared to prior assessment. Also moderate to severe pulmonary hypertension with PASP 58 mmHg.  Patient presents with evidence of volume overload and acute on chronic systolic heart failure in the face of other comorbidities as outlined above. Confusion/possible dementia may be affecting medication compliance - uncertain about baseline mental status. Agree with plan for IV Lasix and further diuresis. This will likely be inhibited by his current degree of renal insufficiency. Cannot use ACE inhibitor, ARB, or Entresto at this point. We will plan to switch his Lopressor to Toprol-XL for cardiomyopathy and also to hopefully achieve better heart rate control at higher dose. Based on chart review, patient has not been anticoagulated for atrial fibrillation with concerns about prior bleeding problems, although it does look like Dr. Bronson Ing discussed anticoagulation with him at office visit. Would continue aspirin for now.  Satira Sark, M.D., F.A.C.C.

## 2016-07-15 NOTE — Care Management Note (Signed)
Case Management Note  Patient Details  Name: Chris [name] MRN: [id number] Date of Birth: 04/09/1941  Subjective/Objective:                  Pt is from home, lives with his wife and is ind with ADL's at baseline. Pt is active with [facility]. Pt has areas to his legs bilaterally his wife has been doing dressing changes on. PT has recommended HH PT at DC. Pt has deferred decision to his wife who has chosen [facility] from list of [facility] providers. Pt's wife is aware that [facility] has 48hrs to make first visit. Romualdo Bolk, of [facility], is aware of referral and will obtain pt info from chart. Pt will not DC today, [facility] will be updated on DC date for pt.   Action/Plan: Will cont to follow.   Expected Discharge Date:       07/17/2016           Expected Discharge Plan:  [place]  In-House Referral:  NA  Discharge planning Services  CM Consult  Post Acute Care Choice:  Home Health Choice offered to:  Patient, Spouse  HH Arranged:  RN, PT, Nurse's Aide [place] Agency:  [place]  Status of Service:  In process, will continue to follow   Sherald Barge, RN 07/15/2016, 2:38 PM

## 2016-07-15 NOTE — Progress Notes (Signed)
PROGRESS NOTE    Chris [name]  [id number] DOB: 1940/10/27 DOA: 07/13/2016 PCP: Gar Ponto, MD     Brief Narrative:  75 y/o man admitted on 10/22 from home with elevated CBGs and LE edema.    Assessment & Plan:   Principal Problem:   Poorly controlled type 2 diabetes mellitus (HCC) Active Problems:   Chronic atrial fibrillation (HCC)   HTN (hypertension)   Acute on chronic heart failure of undertermine significance   Anemia in chronic kidney disease   Acute renal failure superimposed on stage 4 chronic kidney disease (HCC)   Elevated troponin I level   Hyperkalemia   Hyponatremia   Adjustment reaction   Stasis dermatitis of both legs   Acute on chronic diastolic CHF (congestive heart failure) (HCC)   Acute on chronic systolic heart failure, NYHA class 3 ([place])   Coronary artery disease involving coronary bypass graft of native heart without angina pectoris   Poorly controlled DM II -CBGs elevated. -Was transitioned off glucostabilizer without insulin yesterday as his CBG was in the 40s. -Suspect CBGs should improve thruout the day once lantus is given today. -Continue SSI.  Acute on Chronic Systolic CHF -Markedly volume overloaded. -Continue lasix 80 mg IV BID. -Is 1.1 L negative since admission, with UOP of 1500 cc past 24 hours. -ECHO: EF 35-40%, no mention of diastolic function. -Continue BB. Not on ACE-I due to advanced CKD. -Cards on board.  Acute on CKD Stage III-IV -Suspect his new baseline to be around 3. -Recheck renal function in am.  Elevated troponin -Is chronic and likely due to CHF. -ECHO as above. -No plans for ischemic work up at present.  A Fib -Rate elevated. Cards is adjusting toprol dose. -Not good candidate for anticoagulation given prior bleeding profile.  HTN -Well controlled.  Stasis Dermatitis -Wound care consult requested.   DVT prophylaxis: lovenox Code Status: DNR Family Communication: patient only Disposition  Plan: will need a few more days for adequate diuresis.  Consultants:   Cardiology  Procedures:   None  Antimicrobials:   None    Subjective: Grumpy, states we are not getting anything right because his legs are still swollen. No specific complaints otherwise.  Objective: Vitals:   07/14/16 1400 07/14/16 1506 07/14/16 2100 07/15/16 0548  BP: (!) 118/98 140/68 (!) 100/51 129/67  Pulse: 84 76 96 94  Resp: 20 18 18 17   Temp:  97.6 F (36.4 C) 97.8 F (36.6 C) 97.9 F (36.6 C)  TempSrc:  Oral Oral Oral  SpO2: 96% 100% 98% 96%  Weight:  98.2 kg (216 lb 6.4 oz)  97.5 kg (214 lb 14.4 oz)  Height:  6\' 2"  (1.88 m)      Intake/Output Summary (Last 24 hours) at 07/15/16 1555 Last data filed at 07/15/16 1436  Gross per 24 hour  Intake              120 ml  Output             1600 ml  Net            -1480 ml   Filed Weights   07/14/16 0500 07/14/16 1506 07/15/16 0548  Weight: 97.8 kg (215 lb 9.8 oz) 98.2 kg (216 lb 6.4 oz) 97.5 kg (214 lb 14.4 oz)    Examination:  General exam: Alert, awake, oriented x 3 Respiratory system: Clear to auscultation. Respiratory effort normal. Cardiovascular system:RRR. No murmurs, rubs, gallops. Gastrointestinal system: Abdomen is nondistended, soft and nontender. No organomegaly  or masses felt. Normal bowel sounds heard. Central nervous system: Alert and oriented. No focal neurological deficits. Extremities: 3++ edema bilaterally up to hips, +pedal pulses Skin: Clean dressings in place.    Data Reviewed: I have personally reviewed following labs and imaging studies  CBC:  Recent Labs Lab 07/13/16 1418 07/14/16 0527  WBC 5.6 5.7  NEUTROABS 4.3  --   HGB 10.6* 10.2*  HCT 32.9* 32.0*  MCV 108.2* 107.0*  PLT 149* [id number]*   Basic Metabolic Panel:  Recent Labs Lab 07/13/16 1418 07/14/16 0527 07/15/16 0557  NA 131* 136 135  K 5.3* 3.5 4.0  CL 92* 98* 95*  CO2 29 29 29   GLUCOSE 599* 224* 311*  BUN 70* 69* 68*  CREATININE  3.18* 2.99* 3.10*  CALCIUM 8.8* 8.8* 8.7*   GFR: Estimated Creatinine Clearance: 23.2 mL/min (by C-G formula based on SCr of 3.1 mg/dL (H)). Liver Function Tests:  Recent Labs Lab 07/13/16 1418  AST 35  ALT 18  ALKPHOS 139*  BILITOT 1.9*  PROT 7.5  ALBUMIN 3.3*   No results for input(s): LIPASE, AMYLASE in the last 168 hours. No results for input(s): AMMONIA in the last 168 hours. Coagulation Profile: No results for input(s): INR, PROTIME in the last 168 hours. Cardiac Enzymes:  Recent Labs Lab 07/13/16 1418  TROPONINI 0.07*   BNP (last 3 results) No results for input(s): PROBNP in the last 8760 hours. HbA1C:  Recent Labs  07/14/16 0527  HGBA1C 9.7*   CBG:  Recent Labs Lab 07/14/16 1204 07/14/16 1617 07/14/16 2026 07/15/16 0833 07/15/16 1151  GLUCAP 109* 219* 278* 363* 344*   Lipid Profile: No results for input(s): CHOL, HDL, LDLCALC, TRIG, CHOLHDL, LDLDIRECT in the last 72 hours. Thyroid Function Tests: No results for input(s): TSH, T4TOTAL, FREET4, T3FREE, THYROIDAB in the last 72 hours. Anemia Panel: No results for input(s): VITAMINB12, FOLATE, FERRITIN, TIBC, IRON, RETICCTPCT in the last 72 hours. Urine analysis:    Component Value Date/Time   COLORURINE YELLOW 07/13/2016 [place] 07/13/2016 1355   LABSPEC <1.005 (L) 07/13/2016 1355   PHURINE 6.5 07/13/2016 1355   GLUCOSEU >1000 (A) 07/13/2016 1355   HGBUR NEGATIVE 07/13/2016 1355   BILIRUBINUR NEGATIVE 07/13/2016 1355   KETONESUR NEGATIVE 07/13/2016 1355   PROTEINUR NEGATIVE 07/13/2016 1355   UROBILINOGEN 0.2 10/27/2013 1136   NITRITE NEGATIVE 07/13/2016 1355   LEUKOCYTESUR NEGATIVE 07/13/2016 1355   Sepsis Labs: @LABRCNTIP (procalcitonin:4,lacticidven:4)  ) Recent Results (from the past 240 hour(s))  MRSA PCR Screening     Status: None   Collection Time: 07/13/16 11:51 PM  Result Value Ref Range Status   MRSA by PCR NEGATIVE NEGATIVE Final    Comment:        The  GeneXpert MRSA Assay (FDA approved for NASAL specimens only), is one component of a comprehensive MRSA colonization surveillance program. It is not intended to diagnose MRSA infection nor to guide or monitor treatment for MRSA infections.          Radiology Studies: Dg Abd 2 Views  Result Date: 07/13/2016 CLINICAL DATA:  Abdominal distension, hyperglycemia EXAM: ABDOMEN - 2 VIEW COMPARISON:  None. FINDINGS: The bowel gas pattern is normal. There is no evidence of free air. No radio-opaque calculi or other significant radiographic abnormality is seen. Postcholecystectomy surgical clips are noted. Suboptimal study due to patient's large body habitus. IMPRESSION: Negative. Electronically Signed   By: Lahoma Crocker M.D.   On: 07/13/2016 16:24  Scheduled Meds: . aspirin EC  81 mg Oral Daily  . atorvastatin  40 mg Oral q1800  . docusate sodium  100 mg Oral BID  . enoxaparin (LOVENOX) injection  30 mg Subcutaneous Q24H  . famotidine  20 mg Oral QHS  . furosemide  80 mg Intravenous Q12H  . insulin aspart  0-15 Units Subcutaneous TID WC  . insulin aspart  0-5 Units Subcutaneous QHS  . insulin aspart  4 Units Subcutaneous TID WC  . insulin detemir  40 Units Subcutaneous q morning - 10a  . metoprolol succinate  50 mg Oral BID  . sodium chloride flush  3 mL Intravenous Q12H   Continuous Infusions:     LOS: 1 day    Time spent: 25 minutes. Greater than 50% of this time was spent in direct contact with the patient coordinating care.     Lelon Frohlich, MD [facility] Pager [phone]  If 7PM-7AM, please contact night-coverage [email or web address] Password [email or web address] 07/15/2016, 3:55 PM

## 2016-07-15 NOTE — Progress Notes (Signed)
Inpatient Diabetes Program Recommendations  AACE/ADA: New Consensus Statement on Inpatient Glycemic Control (2015)  Target Ranges:  Prepandial:   less than 140 mg/dL      Peak postprandial:   less than 180 mg/dL (1-2 hours)      Critically ill patients:  140 - 180 mg/dL   Results for GEE, KERBEL (MRN [id number]) as of 07/15/2016 07:27  Ref. Range 07/14/2016 01:28 07/14/2016 02:26 07/14/2016 03:25 07/14/2016 04:32 07/14/2016 05:26 07/14/2016 06:24 07/14/2016 07:22 07/14/2016 08:36 07/14/2016 09:53 07/14/2016 12:04 07/14/2016 16:17 07/14/2016 20:26  Glucose-Capillary Latest Ref Range: 65 - 99 mg/dL 483 (H) 444 (H) 390 (H) 326 (H) 256 (H) 136 (H) 114 (H) 79 60 (L) 109 (H) 219 (H) 278 (H)  Results for QUEVON, SEIDL (MRN [id number]) as of 07/15/2016 07:27  Ref. Range 07/15/2016 05:57  Glucose Latest Ref Range: 65 - 99 mg/dL 311 (H)   Results for HENNY, FINO (MRN [id number]) as of 07/15/2016 07:27  Ref. Range 07/14/2016 05:27  Hemoglobin A1C Latest Ref Range: 4.8 - 5.6 % 9.7 (H)   Review of Glycemic Control  Diabetes history: DM2 Outpatient Diabetes medications: Levemir 40-50 units QAM Current orders for Inpatient glycemic control: Levemir 40 units QAM, Novolog 0-15 units TID with meals, Novolog 0-5 units QHS, Novolog 4 units TID with meals  Inpatient Diabetes Program Recommendations: Insulin - Basal: Ordered Levemir 40 unit QAM.  Noted patient was on an insulin drip on 07/14/16 and was not given any basal insulin at time of transition off IV insulin. As a result, fasting lab glucose 311 mg/dl this morning. Patient should receive Levemir this morning at 10am. HgbA1C: A1C 9.7% on 07/14/16 indicating an average glucose of 232 mg/dl over the past 2-3 months. Patient needs to follow up with PCP regarding glycemic control.  Thanks, Barnie Alderman, RN, MSN, CDE Diabetes Coordinator Inpatient Diabetes Program [phone] (Team Pager from [place] to [place]) [phone] ([place] office) [phone] [facility]  office) [phone] [facility] office)

## 2016-07-15 NOTE — Consult Note (Signed)
   [facility] Inpatient Consult   07/15/2016  Chris [name] 05/13/1941 [id number]   Chart review revealed patient eligible for [place] Management services and post hospital discharge follow up related to a diagnosis of DM and 2 admits in 6 months. Patient was evaluated for community based chronic disease management services with [facility] care Management Program as a benefit of patient's [facility] Medicare. Met with the patient and spouse at the bedside to explain [place] Management services. Patient endorses his primary care provider to be [name]. Patient states he was previously receiving [facility] CM and was recently discharged but appreciated the service. Consent form previously signed. Patient will receive post hospital discharge calls and be evaluated for monthly home visits. [facility] Care Management services does not interfere with or replace any services arranged by the inpatient care management team. RNCM left contact information and [facility] literature at the bedside. Made inpatient RNCM aware that [facility] will be following for care management. For additional questions please contact:   Shayanne Gomm RN, [facility] Liaison  [phone]) Business Mobile [phone]) Toll free office

## 2016-07-15 NOTE — Progress Notes (Signed)
Physical Therapy Treatment Patient Details Name: Chris [name] MRN: [id number] DOB: Nov 27, 1940 Today's Date: 07/15/2016    History of Present Illness 75 y.o. male with medical history significant of HTN, HLD, DM, CAD, CHF, afib (not on anticoagulation due to hemorrhagic conversion of CVA) and CVA presenting with severe nausea today.  Missed taking insulin x 1 dose today, maybe not a full dose yesterday.  Glucose fluctuates significantly, staying high much of the time but sometimes very low in the middle of the night and has to eat to bring it up.  Nausea started today.  No emesis, just nausea.  Also weak, tired - just sits around because he doesn't have the energy (his wife seems very frustrated and reports that he used to help around the house and mow the lawn etc but now just lies around all the time).  This has been going on for weeks.  Chronic constipation.  Polyuria.  Chronic LE edema with ulcerations that drain clear fluid requiring his wife to change dressings at least twice daily..  Dx:  poorly controlled DM,  AKI on CKD, and acute on chronic CHF.      PT Comments    Pt seated on EOB at entrance and agreeable to work with therapist.  Pt needed to use restroom at entrance and refused to use RW with gait, utilized SPC and wall walking with gait.  Pt explained benefits of using RW with gait training for safety but continued to refuse.  Gait training with [place] with mod A for safety.  EOS pt left sitting on EOB with call bell within reach and bed alarm set.  No reports of pain through session.  Follow Up Recommendations        Equipment Recommendations       Recommendations for Other Services       Precautions / Restrictions Precautions Precautions: Fall Precaution Comments: Due to increased immobility at home, as well as decreased gait speed.   Restrictions Weight Bearing Restrictions: No    Mobility  Bed Mobility Overal bed mobility:  (Pt sitting at EOB at entrance)                 Transfers Overall transfer level: Needs assistance Equipment used: Straight cane Transfers: Sit to/from Stand Sit to Stand: Min guard         General transfer comment: cueing for hand placement to assist STS  Ambulation/Gait Ambulation/Gait assistance: Min guard Ambulation Distance (Feet): 40 Feet Assistive device: Straight cane Gait Pattern/deviations: Step-to pattern;Wide base of support;Trunk flexed Gait velocity: Pt demonstrates significantly slowed gait speed, and therefore is at high risk for falls.    General Gait Details: Pt demonstrates poor quality of gait using cane in one hand and wall walking with the other UE   Stairs            Wheelchair Mobility    Modified Rankin (Stroke Patients Only)       Balance                                    Cognition Arousal/Alertness: Awake/alert Behavior During Therapy: WFL for tasks assessed/performed Overall Cognitive Status: Within Functional Limits for tasks assessed                      Exercises      General Comments        Pertinent Vitals/Pain Pain  Assessment: No/denies pain    Home Living                      Prior Function            PT Goals (current goals can now be found in the care plan section) Acute Rehab PT Goals Patient Stated Goal: Pt wants to go home.  Progress towards PT goals: Progressing toward goals    Frequency           PT Plan Current plan remains appropriate    Co-evaluation             End of Session Equipment Utilized During Treatment: Gait belt Activity Tolerance: Patient tolerated treatment well Patient left: in bed;with call bell/phone within reach;with bed alarm set     Time: 1740-1800 PT Time Calculation (min) (ACUTE ONLY): 20 min  Charges:  $Gait Training: 8-22 mins $Therapeutic Activity: 8-22 mins                    G Codes:     Ihor Austin, LPTA; CBIS [phone]  Aldona Lento 07/15/2016, 6:18 PM

## 2016-07-16 LAB — GLUCOSE, CAPILLARY
GLUCOSE-CAPILLARY: 111 mg/dL — AB (ref 65–99)
Glucose-Capillary: 145 mg/dL — ABNORMAL HIGH (ref 65–99)
Glucose-Capillary: 178 mg/dL — ABNORMAL HIGH (ref 65–99)
Glucose-Capillary: 161 mg/dL — ABNORMAL HIGH (ref 65–99)

## 2016-07-16 LAB — BASIC METABOLIC PANEL
Anion gap: 8 (ref 5–15)
BUN: 67 mg/dL — AB (ref 6–20)
CHLORIDE: 96 mmol/L — AB (ref 101–111)
CO2: 32 mmol/L (ref 22–32)
CREATININE: 2.93 mg/dL — AB (ref 0.61–1.24)
Calcium: 8.7 mg/dL — ABNORMAL LOW (ref 8.9–10.3)
GFR calc Af Amer: 22 mL/min — ABNORMAL LOW (ref >60)
GFR calc non Af Amer: 19 mL/min — ABNORMAL LOW (ref >60)
Glucose, Bld: 123 mg/dL — ABNORMAL HIGH (ref 65–99)
Potassium: 3.2 mmol/L — ABNORMAL LOW (ref 3.5–5.1)
SODIUM: 136 mmol/L (ref 135–145)

## 2016-07-16 NOTE — Progress Notes (Signed)
PROGRESS NOTE  Chris [name] [id number] DOB: 1941-07-28 DOA: 07/13/2016 PCP: Gar Ponto, MD  Brief Narrative: 75 year old man with systolic heart failure, presented with nausea and hyperglycemia. Admitted for hyperglycemia, acute kidney injury, acute on chronic systolic heart failure, elevated troponin  Assessment/Plan: 1. Acute on chronic systolic congestive heart failure, slowly improving with diuresis. 2. CAD, s/p CABG, ischemic cardiomyopathy 3. CKD stage IV 4. Atrial fibrillation, not on anticoagulation secondary to hemorrhagic conversion of previous CVA 5. Diabetes mellitus with hyperglycemia, initially treated with IV insulin. Blood sugar stable.   Overall stable. Continue IV Lasix today. Home when cleared by cardiology.  DVT prophylaxis: Lovenox Code Status: DNR Family Communication: none Disposition Plan: home with HH PT  Murray Hodgkins, MD  [facility] Direct contact: [phone] --Via amion app OR  --[email or web address]; password [email or web address]  7PM-7AM contact night coverage as above 07/16/2016, 5:02 PM  LOS: 2 days   Consultants:  Cardiology   Procedures:  Echo Study Conclusions  - Left ventricle: Septal apical and inferior basal hypokinesis Wall   thickness was increased in a pattern of mild LVH. Systolic   function was moderately reduced. The estimated ejection fraction   was in the range of 35% to 40%. - Left atrium: The atrium was moderately dilated. - Right atrium: The atrium was moderately dilated. - Atrial septum: No defect or patent foramen ovale was identified. - Pulmonary arteries: PA peak pressure: 58 mm Hg (S). - Pericardium, extracardiac: There was a left pleural effusion.  Antimicrobials:    CC: f/u CHF  Interval history/Subjective: Overall feels okay. Breathing okay.   Objective: Vitals:   07/15/16 2035 07/16/16 0606 07/16/16 1330 07/16/16 1418  BP: 124/60 (!) 141/66 123/75 139/87  Pulse: 90 77 (!) 103 (!) 107  Resp: 17 17  18    Temp: 98 F (36.7 C) 98.4 F (36.9 C) 98.1 F (36.7 C) 98.1 F (36.7 C)  TempSrc: Oral Oral Oral Oral  SpO2: 98% 98% 99% 100%  Weight:  98.6 kg (217 lb 4.8 oz)    Height:        Intake/Output Summary (Last 24 hours) at 07/16/16 1702 Last data filed at 07/15/16 2300  Gross per 24 hour  Intake                0 ml  Output              700 ml  Net             -700 ml     Filed Weights   07/14/16 1506 07/15/16 0548 07/16/16 0606  Weight: 98.2 kg (216 lb 6.4 oz) 97.5 kg (214 lb 14.4 oz) 98.6 kg (217 lb 4.8 oz)    Exam:    Constitutional:  . Appears calm and comfortable Respiratory:  . CTA bilaterally, no w/r/r.  . Respiratory effort normal. No retractions or accessory muscle use Cardiovascular:  . RRR, no m/r/g . 1+ bilateral LE extremity edema   . Telemetry atrial fibrillation . Normal pedal pulses Psychiatric:  . Mental status o Mood, affect appropriate  I have personally reviewed following labs and imaging studies:  Urine output 1100  Blood sugars stable  Potassium 3.2  BUN and creatinine stable 67/2.93  Scheduled Meds: . aspirin EC  81 mg Oral Daily  . atorvastatin  40 mg Oral q1800  . docusate sodium  100 mg Oral BID  . enoxaparin (LOVENOX) injection  30 mg Subcutaneous Q24H  . famotidine  20 mg Oral QHS  .  furosemide  80 mg Intravenous Q12H  . insulin aspart  0-15 Units Subcutaneous TID WC  . insulin aspart  0-5 Units Subcutaneous QHS  . insulin aspart  4 Units Subcutaneous TID WC  . insulin detemir  40 Units Subcutaneous q morning - 10a  . metoprolol succinate  50 mg Oral BID  . sodium chloride flush  3 mL Intravenous Q12H   Continuous Infusions:   Principal Problem:   Poorly controlled type 2 diabetes mellitus (HCC) Active Problems:   Chronic atrial fibrillation (HCC)   Essential hypertension   Anemia in chronic kidney disease   Acute renal failure superimposed on stage 4 chronic kidney disease (HCC)   Elevated troponin I level    Stasis dermatitis of both legs   Acute on chronic systolic heart failure, NYHA class 3 (HCC)   CAD (coronary artery disease), native coronary artery   LOS: 2 days

## 2016-07-16 NOTE — Progress Notes (Addendum)
Patient expressed concern about dressing to lower extremity.  Patient concerned that dressing aren't being done as they have been done at home, and that his leg will become worse.  Explained to patient the leg was currently stable, and that wound care nurse would see patient and would suggest therapy. Rewrapped patients legs because of weeping, will continue to monitor patient.

## 2016-07-16 NOTE — Progress Notes (Signed)
Patient Name: Chris [name] Date of Encounter: 07/16/2016  Primary Cardiologist: Dr. Alain Marion Problem List     Principal Problem:   Poorly controlled type 2 diabetes mellitus [facility]) Active Problems:   Chronic atrial fibrillation (HCC)   Essential hypertension   Anemia in chronic kidney disease   Acute renal failure superimposed on stage 4 chronic kidney disease (HCC)   Elevated troponin I level   Stasis dermatitis of both legs   Acute on chronic systolic heart failure, NYHA class 3 (HCC)   CAD (coronary artery disease), native coronary artery    Subjective   Wants to go home. Generally seems unhappy with his hospital stay. No chest pain or dyspnea at rest.  Inpatient Medications    Scheduled Meds: . aspirin EC  81 mg Oral Daily  . atorvastatin  40 mg Oral q1800  . docusate sodium  100 mg Oral BID  . enoxaparin (LOVENOX) injection  30 mg Subcutaneous Q24H  . famotidine  20 mg Oral QHS  . furosemide  80 mg Intravenous Q12H  . insulin aspart  0-15 Units Subcutaneous TID WC  . insulin aspart  0-5 Units Subcutaneous QHS  . insulin aspart  4 Units Subcutaneous TID WC  . insulin detemir  40 Units Subcutaneous q morning - 10a  . metoprolol succinate  50 mg Oral BID  . sodium chloride flush  3 mL Intravenous Q12H     PRN Meds: acetaminophen **OR** acetaminophen, alum & mag hydroxide-simeth, calcium carbonate (dosed in mg elemental calcium), camphor-menthol **AND** hydrOXYzine, docusate sodium, feeding supplement (NEPRO CARB STEADY), LORazepam, ondansetron **OR** ondansetron (ZOFRAN) IV, prednisoLONE acetate, sorbitol, zolpidem   Vital Signs    Vitals:   07/15/16 0548 07/15/16 2015 07/15/16 2035 07/16/16 0606  BP: 129/67  124/60 (!) 141/66  Pulse: 94  90 77  Resp: 17  17 17   Temp: 97.9 F (36.6 C)  98 F (36.7 C) 98.4 F (36.9 C)  TempSrc: Oral  Oral Oral  SpO2: 96% 97% 98% 98%  Weight: 214 lb 14.4 oz (97.5 kg)   217 lb 4.8 oz (98.6 kg)  Height:         Intake/Output Summary (Last 24 hours) at 07/16/16 1017 Last data filed at 07/15/16 2300  Gross per 24 hour  Intake                0 ml  Output             1100 ml  Net            -1100 ml   Filed Weights   07/14/16 1506 07/15/16 0548 07/16/16 0606  Weight: 216 lb 6.4 oz (98.2 kg) 214 lb 14.4 oz (97.5 kg) 217 lb 4.8 oz (98.6 kg)    Physical Exam   GEN: Elderly male, in no acute distress.  Neck: Supple, increased JVD, no carotid bruits, or masses. Cardiac: Irreg irreg, 2/6 systolic murmur LSB,no rubs, or gallops.plus. Legs wrapped. Respiratory:  Decreased breath sounds with bilateral rales GI: Soft, nontender, nondistended, BS + x 4. Skin: Warm and dry, no rash. Psych: Calm.  Labs    CBC  Recent Labs  07/13/16 1418 07/14/16 0527  WBC 5.6 5.7  NEUTROABS 4.3  --   HGB 10.6* 10.2*  HCT 32.9* 32.0*  MCV 108.2* 107.0*  PLT 149* Q000111Q*   Basic Metabolic Panel  Recent Labs  07/15/16 0557 07/16/16 0622  NA 135 136  K 4.0 3.2*  CL 95* 96*  CO2 29 32  GLUCOSE 311* 123*  BUN 68* 67*  CREATININE 3.10* 2.93*  CALCIUM 8.7* 8.7*   Liver Function Tests  Recent Labs  07/13/16 1418  AST 35  ALT 18  ALKPHOS 139*  BILITOT 1.9*  PROT 7.5  ALBUMIN 3.3*   Cardiac Enzymes  Recent Labs  07/13/16 1418  TROPONINI 0.07*   Hemoglobin A1C  Recent Labs  07/14/16 0527  HGBA1C 9.7*    Telemetry    Atrial fibrillation rate stable - personally reviewed.  ECG    No new tracing.  Radiology    No results found.  Cardiac Studies   Echocardiogram 07/14/16: Study Conclusions   - Left ventricle: Septal apical and inferior basal hypokinesis Wall   thickness was increased in a pattern of mild LVH. Systolic   function was moderately reduced. The estimated ejection fraction   was in the range of 35% to 40%. - Left atrium: The atrium was moderately dilated. - Right atrium: The atrium was moderately dilated. - Atrial septum: No defect or patent foramen ovale  was identified. - Pulmonary arteries: PA peak pressure: 58 mm Hg (S). - Pericardium, extracardiac: There was a left pleural effusion.    Patient Profile     75 year male presents with poorly controlled type 2 diabetes mellitus, acute on chronic systolic heart failure, and acute on chronic renal insufficiency. He has chronic atrial fibrillation and CAD at baseline. Also stasis dermatitis and leg ulcerations - reportedly followed in wound clinic in [place].  Assessment & Plan    1. Acute on chronic systolic heart failure (class 3) with ischemic cardiomyopathy: Would continue IV diuretics, negative 1100 cc past 24 hrs. Still has additional fluid volume to remove. Crt better with diuresis. Potassium 3.2 needs replaced. LVEF most recently 35-40% range.  2. Poorly controlled diabetes mellitus: Patient has been forgetful concerning insulin and blood sugar management by report. HbgA1C 9.7. Being addressed by primary team.   3. Chronic atrial fibrillation: Swtiched from Lopressor to Toprol XL 50 mg BID secondary to cardiomyopathy and for better heart rate control. Patient is not on anticoagulation therapy due to history of hemorrhagic conversion CVA and epistaxis. Continue aspirin.   4. Acute on chronic renal failure: Generally CKD stage 3. Most recent creatinine down to 2.9 from 3.1.  5. Chronic leg edema with venous stasis/dermatitis and ulcerations: Suggest wound care management with regular dressing changes. Patient states that he follows in the wound clinic in [place]. Further diuresis may also be of benefit.  4. Confusion/possible Dementia   Signed, Ermalinda Barrios PA-C 07/16/2016, 10:17 AM    Attending note:  Patient seen and examined. Modified above note by Ms. Bonnell Public PA-C. Chris [name] remains generally unhappy about his hospital stay based on discussion. Feels like "nothing is being done." With IV Lasix he has diuresed approximately 2200 cc in the last 48 hours. Creatinine has come down from  3.1-2.9. Still has fairly significant leg edema in the setting of venous stasis/dermatitis and ulcerations. We did switch from Lopressor to Toprol-XL in light of cardiomyopathy and also for better control of heart rate in chronic atrial fibrillation. He is not a good candidate for ACE inhibitor or ARB in light of his renal insufficiency. Recent systolic blood pressure in 140s, heart rate in the 70s to 80s in atrial fibrillation. Continue aspirin, Lipitor, IV Lasix for now, Toprol-XL. Recommend wound care with regular leg dressing changes. Continue to work on optimizing diabetes regimen per primary team.   Satira Sark,  M.D., F.A.C.C.

## 2016-07-16 NOTE — Progress Notes (Signed)
Informed by [employer] that patient had 3 Beat run [place]. Patient was assessed and denied chest pain or discomfort. Vital signs were as follows:   07/16/16 1330  Vitals  Temp 98.1 F (36.7 C)  Temp Source Oral  BP 123/75  BP Location Right Arm  BP Method Automatic  Patient Position (if appropriate) Lying  Pulse Rate (!) 103  Pulse Rate Source Dinamap  Resp 18  Oxygen Therapy  SpO2 99 %  O2 Device Room Air   MD notified via E-Page. Will continue to monitor patient throughout the shift.

## 2016-07-16 NOTE — Consult Note (Addendum)
[facility] Nurse wound consult note Reason for Consult: place unna boots Wound type: full thickness stasis ulcers Pressure Ulcer POA: No, not pressure related Measurement: There are 12 wounds Right leg proximal to distal 1cm x 1cm x 0.2 cm                                             5 cm x 3cm x 0.2cm                                             1cm x 2cm x 0.2cm                                              1cm x 1cm x 0.2cm                                             4cm x 3cm x 0.2cm Left leg proximal to distal    6cm x 5cm x 0.2cm                                             7cm x 3cm x 0.2cm                                              4cm x 4cm x 0.2cm                                              2cm x 1.5cm x 0.2cm                                              1cm x 0.5cm x 0.2cm                                             0.5cm x 0.5cm x 0.2cm                                               1cm x 2cm x 0.2cm Wound bed: 100% pink Drainage (amount, consistency, odor) copious, watery drainage dripping from wounds Periwound: reddened, swollen, MASD Dressing procedure/placement/frequency: I cleansed legs, patted dry, placed Unna Boots bilaterally. Pt tolerated well, educated pt, his wife, and his daughter about purpose of [employer] and benefits. Pt states he will  try them.  If pt does not go home before Friday then [facility] will need to be consulted re changing boots Friday before discharge. Otherwise, if pt is discharged Thursday, [facility] will need to be arranged to change on Friday. Pt states he does not even want to leave them on that long without changing. He would like them changed daily.  Explained that they can be left on for a week but that we will try to change them MWF. We will not follow, but will remain available to this patient, to nursing, and the medical and/or surgical teams.  Please re-consult if we need to assist further.    Fara Olden, RN-C, WTA-C Wound Treatment Associate

## 2016-07-16 NOTE — Progress Notes (Signed)
Inpatient Diabetes Program Recommendations  AACE/ADA: New Consensus Statement on Inpatient Glycemic Control (2015)  Target Ranges:  Prepandial:   less than 140 mg/dL      Peak postprandial:   less than 180 mg/dL (1-2 hours)      Critically ill patients:  140 - 180 mg/dL   Results for KINGSON, WELLES (MRN [id number]) as of 07/16/2016 08:35  Ref. Range 07/15/2016 08:33 07/15/2016 11:51 07/15/2016 16:25 07/15/2016 21:11 07/16/2016 07:43  Glucose-Capillary Latest Ref Range: 65 - 99 mg/dL 363 (H) 344 (H) 290 (H) 178 (H) 111 (H)   Review of Glycemic Control  Diabetes history: DM2 Outpatient Diabetes medications: Levemir 40-50 units QAM Current orders for Inpatient glycemic control: Levemir 40 units QAM, Novolog 0-15 units TID with meals, Novolog 0-5 units QHS, Novolog 4 units TID with meals  Inpatient Diabetes Program Recommendations: Insulin - Meal Coverage: If post prandial glucose continues to be elevated, please consider increasing meal coverage to Novolog 6 units TID with meals. HgbA1C: A1C 9.7% on 07/14/16 indicating an average glucose of 232 mg/dl over the past 2-3 months. Patient needs to follow up with PCP regarding glycemic control.  Thanks, Barnie Alderman, RN, MSN, CDE Diabetes Coordinator Inpatient Diabetes Program [phone] (Team Pager from [place] to [place]) [phone] ([facility] office) [phone] [facility] office) [phone] [facility] office)

## 2016-07-16 NOTE — Progress Notes (Signed)
WOC contacted [facility] wound [facility] center to verify POC.  Staff reports normal ABI's obtained in February of this year.  They have tried to use Unna's boots on the patient, however the patient was not compliant with all aspects of his care and therefore they switched to kerlix and ACE wraps.  WTA will see this patient today and assess wounds and place Unna's boots if patient agreeable.    Thanks  Demauri Advincula [employer], RN,CNS, [place] [phone])

## 2016-07-17 ENCOUNTER — Ambulatory Visit: Payer: Self-pay | Admitting: *Deleted

## 2016-07-17 LAB — GLUCOSE, CAPILLARY
GLUCOSE-CAPILLARY: 250 mg/dL — AB (ref 65–99)
GLUCOSE-CAPILLARY: 255 mg/dL — AB (ref 65–99)
GLUCOSE-CAPILLARY: 148 mg/dL — AB (ref 65–99)
Glucose-Capillary: 155 mg/dL — ABNORMAL HIGH (ref 65–99)

## 2016-07-17 LAB — BASIC METABOLIC PANEL
ANION GAP: 8 (ref 5–15)
BUN: 61 mg/dL — ABNORMAL HIGH (ref 6–20)
CALCIUM: 8.9 mg/dL (ref 8.9–10.3)
CO2: 31 mmol/L (ref 22–32)
Chloride: 99 mmol/L — ABNORMAL LOW (ref 101–111)
Creatinine, Ser: 2.67 mg/dL — ABNORMAL HIGH (ref 0.61–1.24)
GFR calc Af Amer: 25 mL/min — ABNORMAL LOW (ref >60)
GFR calc non Af Amer: 21 mL/min — ABNORMAL LOW (ref >60)
GLUCOSE: 146 mg/dL — AB (ref 65–99)
Potassium: 3.4 mmol/L — ABNORMAL LOW (ref 3.5–5.1)
Sodium: 138 mmol/L (ref 135–145)

## 2016-07-17 MED ORDER — POTASSIUM CHLORIDE CRYS ER 20 MEQ PO TBCR
20.0000 meq | EXTENDED_RELEASE_TABLET | Freq: Every day | ORAL | Status: DC
Start: 2016-07-18 — End: 2016-07-17

## 2016-07-17 MED ORDER — METOPROLOL SUCCINATE ER 50 MG PO TB24: 50.0000 mg | ORAL_TABLET | Freq: Two times a day (BID) | ORAL | 0 refills | Status: AC

## 2016-07-17 MED ORDER — POTASSIUM CHLORIDE CRYS ER 20 MEQ PO TBCR
40.0000 meq | EXTENDED_RELEASE_TABLET | Freq: Once | ORAL | Status: AC
  Administered 2016-07-17: 40 meq via ORAL
  Filled 2016-07-17: qty 2

## 2016-07-17 MED ORDER — POTASSIUM CHLORIDE CRYS ER 20 MEQ PO TBCR: 20.0000 meq | EXTENDED_RELEASE_TABLET | Freq: Every day | ORAL | 0 refills | Status: AC

## 2016-07-17 MED ORDER — BISACODYL 10 MG RE SUPP
10.0000 mg | Freq: Every day | RECTAL | Status: DC | PRN
  Filled 2016-07-17: qty 1

## 2016-07-17 MED ORDER — TORSEMIDE 20 MG PO TABS: 40.0000 mg | ORAL_TABLET | Freq: Two times a day (BID) | ORAL | 0 refills | Status: DC

## 2016-07-17 MED ORDER — TORSEMIDE 20 MG PO TABS
40.0000 mg | ORAL_TABLET | Freq: Two times a day (BID) | ORAL | Status: DC
  Administered 2016-07-17 (×2): 40 mg via ORAL
  Filled 2016-07-17 (×2): qty 2

## 2016-07-17 NOTE — Progress Notes (Signed)
PROGRESS NOTE  Chris [name] [id number] DOB: 1940-11-06 DOA: 07/13/2016 PCP: Gar Ponto, MD  Brief Narrative: 75 year old man with systolic heart failure, presented with nausea and hyperglycemia. Admitted for hyperglycemia, acute [name] injury, acute on chronic systolic heart failure, elevated troponin  Assessment/Plan: 1. Acute on chronic systolic congestive heart failure, continues to improve. Cardiology has changed patient to oral diuretics. No further inpatient management indicated. 2. CAD, s/p CABG, ischemic cardiomyopathy; appears stable. 3. CKD stage IV, appears stable 4. Atrial fibrillation, not on anticoagulation secondary to hemorrhagic conversion of previous CVA 5. Diabetes mellitus with hyperglycemia, initially treated with IV insulin. Blood sugars stable.   Continues to improve. Some edema but respiratory status is clear. Plan discharge home on Demadex 40 mg twice a day, discontinue Lasix. Continue Toprol-XL, aspirin, Lipitor, potassium supplementation. No ACE inhibitor or ARB secondary to renal dysfunction.  DVT prophylaxis: Lovenox Code Status: DNR Family Communication: none Disposition Plan: home with HH PT  Murray Hodgkins, MD  [facility] Direct contact: [phone] --Via amion app OR  --[email or web address]; password [email or web address]  7PM-7AM contact night coverage as above 07/17/2016, 3:35 PM  LOS: 3 days   Consultants:  Cardiology   Procedures:  Echo Study Conclusions  - Left ventricle: Septal apical and inferior basal hypokinesis Wall   thickness was increased in a pattern of mild LVH. Systolic   function was moderately reduced. The estimated ejection fraction   was in the range of 35% to 40%. - Left atrium: The atrium was moderately dilated. - Right atrium: The atrium was moderately dilated. - Atrial septum: No defect or patent foramen ovale was identified. - Pulmonary arteries: PA peak pressure: 58 mm Hg (S). - Pericardium, extracardiac: There was  a left pleural effusion.  Antimicrobials:    CC: f/u CHF  Interval history/Subjective: Doing well. No complaints.   Objective: Vitals:   07/16/16 1418 07/16/16 2200 07/17/16 0636 07/17/16 1300  BP: 139/87 (!) 137/57 127/67 (!) 124/58  Pulse: (!) 107 84 92 81  Resp:  18 18 18   Temp: 98.1 F (36.7 C) 98 F (36.7 C) 97.8 F (36.6 C) 98 F (36.7 C)  TempSrc: Oral Oral Oral Oral  SpO2: 100% 100% 95% 97%  Weight:   95.3 kg (210 lb)   Height:        Intake/Output Summary (Last 24 hours) at 07/17/16 1535 Last data filed at 07/17/16 1200  Gross per 24 hour  Intake              723 ml  Output             2850 ml  Net            -2127 ml     Filed Weights   07/15/16 0548 07/16/16 0606 07/17/16 0636  Weight: 97.5 kg (214 lb 14.4 oz) 98.6 kg (217 lb 4.8 oz) 95.3 kg (210 lb)    Exam:   Constitutional:  . Appears calm and comfortable lying in bed Respiratory:  . CTA bilaterally, no w/r/r.  . Respiratory effort normal. No retractions or accessory muscle use Cardiovascular:  . RRR, no m/r/g . 2+ bilateral LE extremity edema    I have personally reviewed following labs and imaging studies:  Urine output 1350; -2.8 L since admission  Blood sugars remain stable  Potassium 3.4  BUN and creatinine stable 61/2.67; improved  Scheduled Meds: . aspirin EC  81 mg Oral Daily  . atorvastatin  40 mg Oral q1800  . docusate sodium  100  mg Oral BID  . enoxaparin (LOVENOX) injection  30 mg Subcutaneous Q24H  . famotidine  20 mg Oral QHS  . insulin aspart  0-15 Units Subcutaneous TID WC  . insulin aspart  0-5 Units Subcutaneous QHS  . insulin aspart  4 Units Subcutaneous TID WC  . insulin detemir  40 Units Subcutaneous q morning - 10a  . metoprolol succinate  50 mg Oral BID  . sodium chloride flush  3 mL Intravenous Q12H  . torsemide  40 mg Oral BID   Continuous Infusions:   Principal Problem:   Acute on chronic systolic heart failure, NYHA class 3 (HCC) Active  Problems:   Chronic atrial fibrillation (HCC)   Poorly controlled type 2 diabetes mellitus (HCC)   Anemia in chronic [name] disease   Acute renal failure superimposed on stage 4 chronic [name] disease (HCC)   Stasis dermatitis of both legs   CAD (coronary artery disease), native coronary artery   LOS: 3 days

## 2016-07-17 NOTE — Discharge Summary (Signed)
Physician Discharge Summary  Chris [name] [id number] DOB: 17-Jan-1941 DOA: 07/13/2016  PCP: Gar Ponto, MD  Admit date: 07/13/2016 Discharge date: 07/17/2016  Recommendations for Outpatient Follow-up:  1. Follow-up CHF 2. Follow diabetes mellitus, glycemic control 3. Follow-up chronic kidney disease  Follow-up Information    [facility] .   Contact information: [place] [address] [phone]        Gar Ponto, MD. Schedule an appointment as soon as possible for a visit in 1 week(s).   Specialty:  Family Medicine Contact information: [place] [address] [phone]        Jory Sims, NP Follow up on 08/01/2016.   Specialties:  Nurse Practitioner, Radiology, Cardiology Why:  2:10 PM Contact information: [address] [place] [address] [phone]          Discharge Diagnoses:  1. Acute on chronic systolic congestive heart failure 2. Chronic kidney disease stage IV 3. Diabetes mellitus with hyperglycemia 4. Atrial fibrillation 5. CAD  Discharge Condition: improved Disposition: home with HHPT  Diet recommendation: heart healthy diabetic diet  Filed Weights   07/15/16 0548 07/16/16 0606 07/17/16 0636  Weight: 97.5 kg (214 lb 14.4 oz) 98.6 kg (217 lb 4.8 oz) 95.3 kg (210 lb)    History of present illness:  75 year old man with systolic heart failure, presented with nausea and hyperglycemia. Admitted for hyperglycemia, acute kidney injury, acute on chronic systolic heart failure, elevated troponin  Hospital Course:  Patient was treated with aggressive IV diuresis. Seen by cardiology in consultation. Medications were adjusted appropriately. Clinical condition improved. Cleared for discharge by cardiology. Hospitalization was uncomplicated. Individual issues as below.  1. Acute on chronic systolic congestive heart failure, continues to improve. Cardiology has changed patient to oral  diuretics. No further inpatient management indicated. 2. CAD, s/p CABG, ischemic cardiomyopathy; appears stable. 3. CKD stage IV, appears stable 4. Atrial fibrillation, not on anticoagulation secondary to hemorrhagic conversion of previous CVA 5. Diabetes mellitus with hyperglycemia, initially treated with IV insulin. Blood sugars stable.   Continues to improve. Some edema but respiratory status is clear. Plan discharge home on Demadex 40 mg twice a day, discontinue Lasix. Continue Toprol-XL, aspirin, Lipitor, potassium supplementation. No ACE inhibitor or ARB secondary to renal dysfunction.  Consultants:  Cardiology   Procedures:  Echo Study Conclusions  - Left ventricle: Septal apical and inferior basal hypokinesis Wall thickness was increased in a pattern of mild LVH. Systolic function was moderately reduced. The estimated ejection fraction was in the range of 35% to 40%. - Left atrium: The atrium was moderately dilated. - Right atrium: The atrium was moderately dilated. - Atrial septum: No defect or patent foramen ovale was identified. - Pulmonary arteries: PA peak pressure: 58 mm Hg (S). - Pericardium, extracardiac: There was a left pleural effusion.  Discharge Instructions  Discharge Instructions    (HEART FAILURE PATIENTS) Call MD:  Anytime you have any of the following symptoms: 1) 3 pound weight gain in 24 hours or 5 pounds in 1 week 2) shortness of breath, with or without a dry hacking cough 3) swelling in the hands, feet or stomach 4) if you have to sleep on extra pillows at night in order to breathe.    Complete by:  As directed    AMB Referral to [facility]    Complete by:  As directed    Reason for consult:  post hospital discharge follow up   Diagnoses of:  Diabetes   Expected date of contact:  1-3 days (reserved for hospital discharges)   Please assign patient for community nurse to engage for transition of care calls and evaluate for monthly  home visits. For questions please contact:   Janci Minor RN, [facility]iaison [phone])   Diet - low sodium heart healthy    Complete by:  As directed    Diet Carb Modified    Complete by:  As directed    Discharge instructions    Complete by:  As directed    Call your physician or seek immediate medical attention for increased swelling, difficulty breathing, weight gain, pain or worsening of condition.   Increase activity slowly    Complete by:  As directed        Medication List    STOP taking these medications   furosemide 40 MG tablet Commonly known as:  LASIX   metoprolol 50 MG tablet Commonly known as:  LOPRESSOR     TAKE these medications   alum & mag hydroxide-simeth 200-200-20 MG/5ML suspension Commonly known as:  MAALOX/MYLANTA Take 30 mLs by mouth daily as needed for indigestion or heartburn.   aspirin EC 81 MG tablet Take 81 mg by mouth daily.   atorvastatin 40 MG tablet Commonly known as:  LIPITOR Take 40 mg by mouth daily at 6 PM.   insulin detemir 100 UNIT/ML injection Commonly known as:  LEVEMIR Inject 40-50 Units into the skin every morning.   LORazepam 0.5 MG tablet Commonly known as:  ATIVAN Take 0.5 mg by mouth 2 (two) times daily as needed for anxiety.   metoprolol succinate 50 MG 24 hr tablet Commonly known as:  TOPROL-XL Take 1 tablet (50 mg total) by mouth 2 (two) times daily. Take with or immediately following a meal.   multivitamin with minerals Tabs tablet Take 1 tablet by mouth daily.   nitroGLYCERIN 0.4 MG SL tablet Commonly known as:  NITROSTAT Place 1 tablet (0.4 mg total) under the tongue every 5 (five) minutes as needed for chest pain.   ondansetron 4 MG tablet Commonly known as:  ZOFRAN Take 4 mg by mouth every 8 (eight) hours as needed for nausea or vomiting.   potassium chloride SA 20 MEQ tablet Commonly known as:  K-DUR,KLOR-CON Take 1 tablet (20 mEq total) by mouth daily. 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   ranitidine 150 MG tablet Commonly known as:  ZANTAC Take 150 mg by mouth 2 (two) times daily. Reported on 03/07/2016   torsemide 20 MG tablet Commonly known as:  DEMADEX Take 2 tablets (40 mg total) by mouth 2 (two) times daily.   triamcinolone cream 0.1 % Commonly known as:  KENALOG APPLY TO AFFECTED AREA DAILY AS DIRECTED What changed:  how much to take  how to take this  when to take this  reasons to take this  additional instructions   Vitamin D-3 5000 UNITS Tabs Take 5,000 Units by mouth daily.      Allergies  Allergen Reactions  . Ciprofloxacin Other (See Comments)    Sores all on legs.   . Finasteride     Affected  Patients testicles  . Lisinopril Other (See Comments)    Kidney damage    . Metoclopramide     Jerking all over  .  Protonix [Pantoprazole Sodium]     abd pain  . Xanax [Alprazolam] Other (See Comments)    Not in right state of mind.     The results of significant diagnostics from this hospitalization (including imaging, microbiology, ancillary and laboratory) are listed below for reference.    Significant Diagnostic Studies: Dg Chest 2 View  Result Date: 07/13/2016 CLINICAL DATA:  Pulmonary edema, CHF, diabetes and atrial fibrillation EXAM: CHEST  2 VIEW COMPARISON:  04/09/2016 FINDINGS: Cardiomegaly again evident and prior coronary bypass changes. Improvement in the basilar interstitial edema pattern and small pleural effusions compatible with resolving CHF. Right hemidiaphragm is better visualized. Left lower lobe increased opacity in the retrocardiac region obscures the left hemidiaphragm with a persistent effusion. Left basilar pneumonia not excluded. No pneumothorax. Upper lobes remain clear. Trachea is midline. Degenerative changes of the spine and  diffuse osteopenia. IMPRESSION: Improving basilar edema pattern as above. Persistent left lower lobe retrocardiac consolidative opacity and associated effusion concerning for left basilar pneumonia. Electronically Signed   By: Jerilynn Mages.  Shick M.D.   On: 07/13/2016 14:27   Dg Abd 2 Views  Result Date: 07/13/2016 CLINICAL DATA:  Abdominal distension, hyperglycemia EXAM: ABDOMEN - 2 VIEW COMPARISON:  None. FINDINGS: The bowel gas pattern is normal. There is no evidence of free air. No radio-opaque calculi or other significant radiographic abnormality is seen. Postcholecystectomy surgical clips are noted. Suboptimal study due to patient's large body habitus. IMPRESSION: Negative. Electronically Signed   By: Lahoma Crocker M.D.   On: 07/13/2016 16:24    Microbiology: Recent Results (from the past 240 hour(s))  MRSA PCR Screening     Status: None   Collection Time: 07/13/16 11:51 PM  Result Value Ref Range Status   MRSA by PCR NEGATIVE NEGATIVE Final    Comment:        The GeneXpert MRSA Assay (FDA approved for NASAL specimens only), is one component of a comprehensive MRSA colonization surveillance program. It is not intended to diagnose MRSA infection nor to guide or monitor treatment for MRSA infections.      Labs: Basic Metabolic Panel:  Recent Labs Lab 07/13/16 1418 07/14/16 0527 07/15/16 0557 07/16/16 0622 07/17/16 0656  NA 131* 136 135 136 138  K 5.3* 3.5 4.0 3.2* 3.4*  CL 92* 98* 95* 96* 99*  CO2 29 29 29  32 31  GLUCOSE 599* 224* 311* 123* 146*  BUN 70* 69* 68* 67* 61*  CREATININE 3.18* 2.99* 3.10* 2.93* 2.67*  CALCIUM 8.8* 8.8* 8.7* 8.7* 8.9   Liver Function Tests:  Recent Labs Lab 07/13/16 1418  AST 35  ALT 18  ALKPHOS 139*  BILITOT 1.9*  PROT 7.5  ALBUMIN 3.3*   CBC:  Recent Labs Lab 07/13/16 1418 07/14/16 0527  WBC 5.6 5.7  NEUTROABS 4.3  --   HGB 10.6* 10.2*  HCT 32.9* 32.0*  MCV 108.2* 107.0*  PLT 149* 141*   Cardiac Enzymes:  Recent Labs Lab  07/13/16 1418  TROPONINI 0.07*     Recent Labs  12/13/15 1117 03/28/16 1532 07/13/16 1418  BNP 281.0* 397.0* 506.0*    CBG:  Recent Labs Lab 07/16/16 1619 07/16/16 2200 07/17/16 0738 07/17/16 1126 07/17/16 1422  GLUCAP 178* 161* 155* 250* 255*    Principal Problem:   Acute on chronic systolic heart failure, NYHA class 3 (HCC) Active Problems:   Chronic atrial fibrillation (HCC)   Poorly controlled type 2 diabetes mellitus (HCC)   Anemia in chronic kidney disease   Acute renal failure  superimposed on stage 4 chronic kidney disease ([place])   Stasis dermatitis of both legs   CAD (coronary artery disease), native coronary artery   Time coordinating discharge: 71 ,omites  Signed:  Murray Hodgkins, MD [facility] 07/17/2016, 3:46 PM

## 2016-07-17 NOTE — Progress Notes (Signed)
Patient Name: Chris [name] Date of Encounter: 07/17/2016  Primary Cardiologist: Dr. Jamesetta So [facility] Problem List     Principal Problem:   Acute on chronic systolic heart failure, NYHA class 3 (HCC) Active Problems:   Chronic atrial fibrillation (HCC)   Poorly controlled type 2 diabetes mellitus (HCC)   Anemia in chronic kidney disease   Acute renal failure superimposed on stage 4 chronic kidney disease (HCC)   Stasis dermatitis of both legs   CAD (coronary artery disease), native coronary artery    Subjective   Complaints of leg and foot pain from ACE wraps. Wants to go home. Denies issues with breathing. Not ambulating much.   Inpatient Medications    Scheduled Meds: . aspirin EC  81 mg Oral Daily  . atorvastatin  40 mg Oral q1800  . docusate sodium  100 mg Oral BID  . enoxaparin (LOVENOX) injection  30 mg Subcutaneous Q24H  . famotidine  20 mg Oral QHS  . furosemide  80 mg Intravenous Q12H  . insulin aspart  0-15 Units Subcutaneous TID WC  . insulin aspart  0-5 Units Subcutaneous QHS  . insulin aspart  4 Units Subcutaneous TID WC  . insulin detemir  40 Units Subcutaneous q morning - 10a  . metoprolol succinate  50 mg Oral BID  . potassium chloride  40 mEq Oral Once  . sodium chloride flush  3 mL Intravenous Q12H     PRN Meds: acetaminophen **OR** acetaminophen, alum & mag hydroxide-simeth, calcium carbonate (dosed in mg elemental calcium), camphor-menthol **AND** hydrOXYzine, docusate sodium, feeding supplement (NEPRO CARB STEADY), LORazepam, ondansetron **OR** ondansetron (ZOFRAN) IV, prednisoLONE acetate, sorbitol, zolpidem   Vital Signs    Vitals:   07/16/16 1330 07/16/16 1418 07/16/16 2200 07/17/16 0636  BP: 123/75 139/87 (!) 137/57 127/67  Pulse: (!) 103 (!) 107 84 92  Resp: 18  18 18   Temp: 98.1 F (36.7 C) 98.1 F (36.7 C) 98 F (36.7 C) 97.8 F (36.6 C)  TempSrc: Oral Oral Oral Oral  SpO2: 99% 100% 100% 95%  Weight:    210 lb (95.3  kg)  Height:        Intake/Output Summary (Last 24 hours) at 07/17/16 0859 Last data filed at 07/17/16 0600  Gross per 24 hour  Intake              243 ml  Output             1350 ml  Net            -1107 ml   Filed Weights   07/15/16 0548 07/16/16 0606 07/17/16 0636  Weight: 214 lb 14.4 oz (97.5 kg) 217 lb 4.8 oz (98.6 kg) 210 lb (95.3 kg)    Physical Exam   GEN: Elderly male, in no acute distress.  Neck: Supple, increased JVD, no carotid bruits, or masses. Cardiac: Irreg irreg, 2/6 systolic murmur LSB,no rubs, or gallops.plus. Legs wrapped. Respiratory:  RLL crackles are noted but clear in upper lobes. Decreased breath sounds. GI: Soft, nontender, nondistended, BS + x 4. Skin: Warm and dry, no rash.ACE wraps to legs bilaterally. No edema noted.  Psych: Calm.  Labs    Basic Metabolic Panel  Recent Labs  07/16/16 0622 07/17/16 0656  NA 136 138  K 3.2* 3.4*  CL 96* 99*  CO2 32 31  GLUCOSE 123* 146*  BUN 67* 61*  CREATININE 2.93* 2.67*  CALCIUM 8.7* 8.9    Telemetry  Atrial fibrillation rate stable.  ECG    No new tracing.  Radiology    No results found.  Cardiac Studies   Echocardiogram 07/14/16: Study Conclusions   - Left ventricle: Septal apical and inferior basal hypokinesis Wall   thickness was increased in a pattern of mild LVH. Systolic   function was moderately reduced. The estimated ejection fraction   was in the range of 35% to 40%. - Left atrium: The atrium was moderately dilated. - Right atrium: The atrium was moderately dilated. - Atrial septum: No defect or patent foramen ovale was identified. - Pulmonary arteries: PA peak pressure: 58 mm Hg (S). - Pericardium, extracardiac: There was a left pleural effusion.    Patient Profile     75 year male presents with poorly controlled type 2 diabetes mellitus, acute on chronic systolic heart failure, and acute on chronic renal insufficiency. He has chronic atrial fibrillation and CAD at  baseline. Also stasis dermatitis and leg ulcerations - reportedly followed in wound clinic in [place].  Assessment & Plan    1. Acute on chronic systolic heart failure (class 3) with ischemic cardiomyopathy:  Has diuresed 2.866 since admission, with improving creatinine to 2.67 this am . Potassium improved to 3.4 with po potasium at 40 mEq daily.  LVEF most recently 35-40% range. Consider transitioning to po diuretics. Home dose of 40 mg BID will potentially need to be increased to 60 mg BID or change to torsemide 40 mg BID.  2. Poorly controlled diabetes mellitus: Patient has been forgetful concerning insulin and blood sugar management by report. HbgA1C 9.7. Being addressed by primary team.   3. Chronic atrial fibrillation: Swtiched from Lopressor to Toprol XL 50 mg BID secondary to cardiomyopathy and for better heart rate control. Patient is not on anticoagulation therapy due to history of hemorrhagic conversion CVA and epistaxis. Continue aspirin.   4. Acute on chronic renal failure: Generally CKD stage 3. Most recent creatinine down to 2.6 from 3.1 on admisison.  5. Chronic leg edema with venous stasis/dermatitis and ulcerations: Suggest wound care management with regular dressing changes. Patient states that he follows in the wound clinic in [place]. Further diuresis may also be of benefit.He now has ACE wraps in place with plans for [place] per patient. He is not tolerating the wraps very well complaining of pain.   4. Confusion/possible Dementia   Signed, Leonia Reader NP 07/17/2016   Attending note:  Patient seen and examined. Discussed with Ms. Lawrence NP, agree with her above assessment. Mr. Prevatte has diuresed approximately 3000 cc overall and we plan to switch to oral diuretic regimen today. In light of renal insufficiency will initiate Demadex at 40 mg twice daily instead of Lasix. Creatinine 2.6. His legs are wrapped this morning, he does complain of some discomfort in his legs.  Lungs are less congested. He otherwise continues on aspirin, Toprol-XL, Lipitor, and potassium supplements. Not candidate for ACE inhibitor or ARB with his renal dysfunction. Chart review indicates disposition is home with HHPT.  Satira Sark, M.D., F.A.C.C.

## 2016-07-17 NOTE — Care Management Important Message (Signed)
Important Message  Patient Details  Name: Chris [name] MRN: [id number] Date of Birth: Jan 15, 1941   Medicare Important Message Given:  Yes    Sherald Barge, RN 07/17/2016, 1:54 PM

## 2016-07-17 NOTE — Care Management Note (Signed)
Case Management Note  Patient Details  Name: Chris [name] MRN: [id number] Date of Birth: 02-08-1941  Expected Discharge Date:    07/17/2016              Expected Discharge Plan:  [place]  In-House Referral:  NA  Discharge planning Services  CM Consult  Post Acute Care Choice:  Home Health Choice offered to:  Patient, Spouse  DME Arranged:    DME Agency:     HH Arranged:  RN, PT, Nurse's Aide [place] Agency:  [place]  Status of Service:  Completed, signed off  Additional Comments: [place] home today or within 24 hrs. Romualdo Bolk, of [facility], is aware of DC. Pt is aware HH has 48hrs to make first visit. Sherald Barge, RN 07/17/2016, 1:58 PM

## 2016-07-18 ENCOUNTER — Encounter: Payer: Self-pay | Admitting: *Deleted

## 2016-07-18 ENCOUNTER — Other Ambulatory Visit: Payer: Self-pay | Admitting: *Deleted
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
## 2016-07-21 ENCOUNTER — Emergency Department (HOSPITAL_COMMUNITY)
Admission: EM | Admit: 2016-07-21 | Discharge: 2016-07-21 | Disposition: A | Discharge status: Home / Self Care | Payer: Commercial Managed Care - HMO | Attending: Emergency Medicine | Admitting: Emergency Medicine

## 2016-07-21 ENCOUNTER — Emergency Department (HOSPITAL_COMMUNITY): Payer: Commercial Managed Care - HMO

## 2016-07-21 ENCOUNTER — Encounter (HOSPITAL_COMMUNITY): Payer: Self-pay | Admitting: Emergency Medicine

## 2016-07-21 DIAGNOSIS — N184 Chronic kidney disease, stage 4 (severe): Secondary | ICD-10-CM | POA: Diagnosis not present

## 2016-07-21 DIAGNOSIS — I5022 Chronic systolic (congestive) heart failure: Secondary | ICD-10-CM | POA: Insufficient documentation

## 2016-07-21 DIAGNOSIS — Z7982 Long term (current) use of aspirin: Secondary | ICD-10-CM | POA: Diagnosis not present

## 2016-07-21 DIAGNOSIS — E1122 Type 2 diabetes mellitus with diabetic chronic kidney disease: Secondary | ICD-10-CM | POA: Insufficient documentation

## 2016-07-21 DIAGNOSIS — Z79899 Other long term (current) drug therapy: Secondary | ICD-10-CM | POA: Diagnosis not present

## 2016-07-21 DIAGNOSIS — I130 Hypertensive heart and chronic kidney disease with heart failure and stage 1 through stage 4 chronic kidney disease, or unspecified chronic kidney disease: Secondary | ICD-10-CM | POA: Insufficient documentation

## 2016-07-21 DIAGNOSIS — I2510 Atherosclerotic heart disease of native coronary artery without angina pectoris: Secondary | ICD-10-CM | POA: Insufficient documentation

## 2016-07-21 DIAGNOSIS — L03116 Cellulitis of left lower limb: Secondary | ICD-10-CM | POA: Insufficient documentation

## 2016-07-21 DIAGNOSIS — M79605 Pain in left leg: Secondary | ICD-10-CM | POA: Diagnosis present

## 2016-07-21 HISTORY — DX: Patient's other noncompliance with medication regimen: Z91.14

## 2016-07-21 HISTORY — DX: Localized edema: R60.0

## 2016-07-21 HISTORY — DX: Pain in leg, unspecified: M79.606

## 2016-07-21 HISTORY — DX: Disorientation, unspecified: R41.0

## 2016-07-21 HISTORY — DX: Patient's other noncompliance with medication regimen for other reason: Z91.148

## 2016-07-21 HISTORY — DX: Other chronic pain: G89.29

## 2016-07-21 LAB — BASIC METABOLIC PANEL
ANION GAP: 8 (ref 5–15)
BUN: 66 mg/dL — ABNORMAL HIGH (ref 6–20)
CALCIUM: 8.5 mg/dL — AB (ref 8.9–10.3)
CO2: 31 mmol/L (ref 22–32)
Chloride: 96 mmol/L — ABNORMAL LOW (ref 101–111)
Creatinine, Ser: 2.87 mg/dL — ABNORMAL HIGH (ref 0.61–1.24)
GFR calc Af Amer: 23 mL/min — ABNORMAL LOW (ref >60)
GFR, EST NON AFRICAN AMERICAN: 20 mL/min — AB (ref >60)
Glucose, Bld: 197 mg/dL — ABNORMAL HIGH (ref 65–99)
POTASSIUM: 4.2 mmol/L (ref 3.5–5.1)
SODIUM: 135 mmol/L (ref 135–145)

## 2016-07-21 LAB — CBC WITH DIFFERENTIAL/PLATELET
BASOS ABS: 0 10*3/uL (ref 0.0–0.1)
BASOS PCT: 0 %
EOS ABS: 0.1 10*3/uL (ref 0.0–0.7)
Eosinophils Relative: 1 %
HEMATOCRIT: 32.8 % — AB (ref 39.0–52.0)
HEMOGLOBIN: 10.6 g/dL — AB (ref 13.0–17.0)
Lymphocytes Relative: 12 %
Lymphs Abs: 0.9 10*3/uL (ref 0.7–4.0)
MCH: 34.8 pg — ABNORMAL HIGH (ref 26.0–34.0)
MCHC: 32.3 g/dL (ref 30.0–36.0)
MCV: 107.5 fL — ABNORMAL HIGH (ref 78.0–100.0)
MONO ABS: 0.9 10*3/uL (ref 0.1–1.0)
MONOS PCT: 12 %
NEUTROS ABS: 5.6 10*3/uL (ref 1.7–7.7)
NEUTROS PCT: 75 %
Platelets: 175 10*3/uL (ref 150–400)
RBC: 3.05 MIL/uL — ABNORMAL LOW (ref 4.22–5.81)
RDW: 15.6 % — AB (ref 11.5–15.5)
WBC: 7.5 10*3/uL (ref 4.0–10.5)

## 2016-07-21 LAB — LACTIC ACID, PLASMA: LACTIC ACID, VENOUS: 0.9 mmol/L (ref 0.5–1.9)

## 2016-07-21 MED ORDER — CLINDAMYCIN HCL 150 MG PO CAPS: ORAL_CAPSULE | ORAL | 0 refills | Status: DC

## 2016-07-21 MED ORDER — CLINDAMYCIN HCL 150 MG PO CAPS
450.0000 mg | ORAL_CAPSULE | Freq: Once | ORAL | Status: AC
  Administered 2016-07-21: 450 mg via ORAL
  Filled 2016-07-21: qty 3

## 2016-07-21 NOTE — ED Notes (Signed)
Patient given discharge instruction, verbalized understand. IV removed, band aid applied. Patient ambulatory out of the department.  

## 2016-07-21 NOTE — ED Notes (Signed)
Gave pt a urinal to void, family at the bedside

## 2016-07-21 NOTE — ED Provider Notes (Signed)
[facility] DEPT Provider Note   CSN: [id number] Arrival date & time: 07/21/16  1627     History   Chief Complaint Chief Complaint  Patient presents with  . Wound Infection    HPI Chris [name] is a 75 y.o. male.  The history is provided by the patient, the spouse and a relative. The history is limited by the condition of the patient (Hx confusion).  Pt was seen at [facility]. Per pt and his family, c/o gradual onset and worsening of persistent "left leg pain" and "redness" that began several days ago. Pt's wife states she took off pt's Haematologist today "because he kept saying his leg hurt." Pt's wife states the dressings over his left leg wounds "had green drainage on them" and "his left leg felt hot." Pt denies any right leg complaints. Pt was recently discharged from the hospital for hyperglycemia, CHF, legs wounds care. Pt's family states pt has been taking his medications as prescribed. Denies fevers, no focal motor weakness, no back pain, no abd pain.   Past Medical History:  Diagnosis Date  . Acute cerebrovascular accident [facility])    Presumed embolic from his atrial fibrillation with hemorrhagic conbersion  . Atrial fibrillation (HCC)    Not anticoagulated - chronic epistaxis as well as hemorrhagic conversion of prior stroke  . CAD (coronary artery disease)    Multivessel status post CABG  . Chronic leg pain   . Chronic systolic heart failure ([place])   . Confusion   . Essential hypertension   . History of medication noncompliance    "due to confusion"  . Hyperlipidemia   . Ischemic cardiomyopathy    LVEF 35-40%  . Macrocytosis    Normal B12 & folate levels  . Myocardial infarct   . Pedal edema   . Pleural effusion 10/05/2013  . Tubular adenoma   . Type 2 diabetes mellitus [facility])     Patient Active Problem List   Diagnosis Date Noted  . Acute on chronic systolic heart failure, NYHA class 3 ([place])   . CAD (coronary artery disease), native coronary artery   . Acute on  chronic diastolic CHF (congestive heart failure) ([place]) 07/14/2016  . Hyperkalemia 07/13/2016  . Hyponatremia 07/13/2016  . Adjustment reaction 07/13/2016  . Stasis dermatitis of both legs 07/13/2016  . Healthcare-associated pneumonia 03/28/2016  . Acute renal failure superimposed on stage 4 chronic kidney disease ([place]) 03/28/2016  . History of colonic polyps 12/27/2015  . Chronic kidney disease, stage IV (severe) ([place]) 11/26/2015  . Anemia in chronic kidney disease 11/26/2015  . Dyspepsia 12/01/2013  . Recurrent right pleural effusion 10/31/2013  . Diabetic nephropathy ([place]) 08/06/2013  . Acute on chronic heart failure of undertermine significance 08/06/2013  . CAP (community acquired pneumonia) 07/27/2013  . Chronic atrial fibrillation ([place]) 07/23/2013  . CVA (cerebral infarction) 07/23/2013  . Essential hypertension 07/23/2013  . Poorly controlled type 2 diabetes mellitus ([place]) 07/23/2013  . GERD (gastroesophageal reflux disease) 10/18/2012  . Abdominal pain 10/18/2012  . Constipation 10/18/2012    Past Surgical History:  Procedure Laterality Date  . CHEST TUBE INSERTION Right 10/31/2013   Procedure: INSERTION PLEURAL DRAINAGE CATHETER;  Surgeon: Melrose Nakayama, MD;  Location: [address];  Service: Thoracic;  Laterality: Right;  . CHOLECYSTECTOMY  2008  . COLONOSCOPY N/A 12/23/2012   Dr. Gala Romney: multiple rectal and colonic polyps removed, tubular adenomas. needs surveillance April 2017.   Marland Kitchen CORONARY ARTERY BYPASS GRAFT    . ESOPHAGOGASTRODUODENOSCOPY (EGD) WITH ESOPHAGEAL DILATION  N/A 10/27/2012   Dr. Gala Romney: distal esophageal diverticulum, non-complete Schatzki's ring s/p dilation, distal esophageal nodule with benign path, negative Barrett's  . PLEURAL EFFUSION DRAINAGE Right 10/31/2013   Procedure: DRAINAGE OF PLEURAL EFFUSION;  Surgeon: Melrose Nakayama, MD;  Location: [facility];  Service: Thoracic;  Laterality: Right;  . TONSILLECTOMY    . VIDEO ASSISTED THORACOSCOPY Right 10/31/2013    Procedure: VIDEO ASSISTED THORACOSCOPY;  Surgeon: Melrose Nakayama, MD;  Location: [facility];  Service: Thoracic;  Laterality: Right;       Home Medications    Prior to Admission medications   Medication Sig Start Date End Date Taking? Authorizing Provider  atorvastatin (LIPITOR) 40 MG tablet Take 40 mg by mouth daily at 6 PM.  01/07/14  Yes Historical Provider, MD  Cholecalciferol (VITAMIN D-3) 5000 UNITS TABS Take 5,000 Units by mouth daily.    Yes Historical Provider, MD  insulin detemir (LEVEMIR) 100 UNIT/ML injection Inject 40-50 Units into the skin every morning.    Yes Historical Provider, MD  LORazepam (ATIVAN) 0.5 MG tablet Take 0.5 mg by mouth 2 (two) times daily as needed for anxiety.   Yes Historical Provider, MD  metoprolol succinate (TOPROL-XL) 50 MG 24 hr tablet Take 1 tablet (50 mg total) by mouth 2 (two) times daily. Take with or immediately following a meal. 07/17/16  Yes Samuella Cota, MD  Multiple Vitamin (MULTIVITAMIN WITH MINERALS) TABS tablet Take 1 tablet by mouth daily.   Yes Historical Provider, MD  nitroGLYCERIN (NITROSTAT) 0.4 MG SL tablet Place 1 tablet (0.4 mg total) under the tongue every 5 (five) minutes as needed for chest pain. 04/10/16  Yes Herminio Commons, MD  ondansetron (ZOFRAN) 4 MG tablet Take 4 mg by mouth every 8 (eight) hours as needed for nausea or vomiting.   Yes Historical Provider, MD  potassium chloride SA (K-DUR,KLOR-CON) 20 MEQ tablet Take 1 tablet (20 mEq total) by mouth daily. 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   Yes Historical Provider, MD  ranitidine (ZANTAC) 150 MG tablet Take 150 mg by mouth 2 (two) times daily. Reported on 03/07/2016   Yes Historical Provider, MD  torsemide (DEMADEX) 20 MG tablet Take 2 tablets (40 mg total) by mouth 2 (two) times daily. 07/17/16  Yes Samuella Cota, MD  triamcinolone cream (KENALOG) 0.1 % APPLY TO AFFECTED AREA DAILY AS DIRECTED Patient taking differently: Apply 1 application topically daily as needed (for rash).  03/29/16  Yes Orvan Falconer, MD  alum & mag hydroxide-simeth (MAALOX/MYLANTA) 200-200-20 MG/5ML suspension Take 30 mLs by mouth daily as needed for indigestion or heartburn.     Historical Provider, MD  aspirin EC 81 MG tablet Take 81 mg by mouth daily.    Historical Provider, MD    Family History Family History  Problem Relation Age of Onset  . Colon polyps Sister   . Colon polyps Sister   . Colon cancer Neg Hx     Social History Social History  Substance Use Topics  . Smoking status: Never Smoker  . Smokeless tobacco: Never Used  . Alcohol use No     Allergies   Ciprofloxacin; Finasteride; Lisinopril; Metoclopramide; Protonix [pantoprazole sodium]; and Xanax [alprazolam]   Review of Systems Review of Systems  Unable to perform ROS: Dementia     Physical Exam Updated Vital Signs  BP 137/73   Pulse 93   Temp 98.4 F (36.9 C) (Oral)   Resp 18   Ht 6\' 3"  (1.905 m)   Wt 210 lb (95.3 kg)   SpO2 100%   BMI 26.25 kg/m   Physical Exam 1650: Physical examination:  Nursing notes reviewed; Vital signs and O2 SAT reviewed;  Constitutional: Well developed, Well nourished, Well hydrated, In no acute distress; Head:  Normocephalic, atraumatic; Eyes: EOMI, PERRL, No scleral icterus; ENMT: Mouth and pharynx normal, Mucous membranes moist; Neck: Supple, Full range of motion, No lymphadenopathy; Cardiovascular: Regular rate and rhythm, No gallop; Respiratory: Breath sounds clear & equal bilaterally, No wheezes.  Speaking full sentences with ease, Normal respiratory effort/excursion; Chest: Nontender, Movement normal; Abdomen: Soft, Nontender, Nondistended, Normal bowel sounds; Genitourinary: No CVA tenderness; Extremities: Pulses normal, No deformity. +tr pedal edema bilat. No calf edema or asymmetry. +multiple shallow  ulcers to bilat LE's. +LLE with erythema, mild warmth and tenderness surrounding anterior tibial wounds. No soft tissue crepitus.; Neuro: AA&Ox3, confused re: events. +HOH, otherwise major CN grossly intact. No facial droop. Speech clear. No gross focal motor or sensory deficits in extremities.; Skin: Color normal, Warm, Dry.   ED Treatments / Results  Labs (all labs ordered are listed, but only abnormal results are displayed)   EKG  EKG Interpretation None       Radiology   Procedures Procedures (including critical care time)  Medications Ordered in ED Medications - No data to display   Initial Impression / Assessment and Plan / ED Course  I have reviewed the triage vital signs and the nursing notes.  Pertinent labs & imaging results that were available during my care of the patient were reviewed by me and considered in my medical decision making (see chart for details).  MDM Reviewed: previous chart, nursing note and vitals Reviewed previous: labs Interpretation: labs and ultrasound   Results for orders placed or performed during the hospital encounter of [id number]  Basic metabolic panel  Result Value Ref Range   Sodium 135 135 - 145 mmol/L   Potassium 4.2 3.5 - 5.1 mmol/L   Chloride 96 (L) 101 - 111 mmol/L   CO2 31 22 - 32 mmol/L   Glucose, Bld 197 (H) 65 - 99 mg/dL   BUN 66 (H) 6 - 20 mg/dL   Creatinine, Ser 2.87 (H) 0.61 - 1.24 mg/dL   Calcium 8.5 (L) 8.9 - 10.3 mg/dL   GFR calc non Af Amer 20 (L) >60 mL/min   GFR calc Af Amer 23 (L) >60 mL/min   Anion gap 8 5 - 15  Lactic acid, plasma  Result Value Ref Range   Lactic Acid, Venous 0.9 0.5 - 1.9 mmol/L  CBC with Differential  Result Value Ref Range   WBC 7.5 4.0 - 10.5 K/uL   RBC 3.05 (L) 4.22 - 5.81 MIL/uL   Hemoglobin 10.6 (L) 13.0 - 17.0 g/dL   HCT 32.8 (L) 39.0 - 52.0 %   MCV 107.5 (H) 78.0 - 100.0 fL   MCH 34.8 (H) 26.0 - 34.0 pg   MCHC 32.3 30.0 - 36.0 g/dL   RDW 15.6 (H) 11.5 - 15.5 %    Platelets 175 150 - 400 K/uL   Neutrophils Relative % 75 %   Neutro Abs 5.6 1.7 - 7.7 K/uL   Lymphocytes Relative 12 %   Lymphs Abs 0.9 0.7 - 4.0 K/uL   Monocytes Relative 12 %   Monocytes Absolute 0.9 0.1 - 1.0 K/uL  Eosinophils Relative 1 %   Eosinophils Absolute 0.1 0.0 - 0.7 K/uL   Basophils Relative 0 %   Basophils Absolute 0.0 0.0 - 0.1 K/uL   US Venous Img Lower Unilateral Left Result Date: 07/21/2016 CLINICAL DATA:  Chronic left lower extremity pain, venous stasis dermatitis. EXAM: Left LOWER EXTREMITY VENOUS DOPPLER ULTRASOUND TECHNIQUE: Gray-scale sonography with graded compression, as well as color Doppler and duplex ultrasound were performed to evaluate the lower extremity deep venous systems from the level of the common femoral vein and including the common femoral, femoral, profunda femoral, popliteal and calf veins including the posterior tibial, peroneal and gastrocnemius veins when visible. The superficial great saphenous vein was also interrogated. Spectral Doppler was utilized to evaluate flow at rest and with distal augmentation maneuvers in the common femoral, femoral and popliteal veins. COMPARISON:  None. FINDINGS: Contralateral Common Femoral Vein: Respiratory phasicity is normal and symmetric with the symptomatic side. No evidence of thrombus. Normal compressibility. Common Femoral Vein: No evidence of thrombus. Normal compressibility, respiratory phasicity and response to augmentation. Saphenofemoral Junction: No evidence of thrombus. Normal compressibility and flow on color Doppler imaging. Profunda Femoral Vein: No evidence of thrombus. Normal compressibility and flow on color Doppler imaging. Femoral Vein: No evidence of thrombus. Normal compressibility, respiratory phasicity and response to augmentation. Popliteal Vein: No evidence of thrombus. Normal compressibility, respiratory phasicity and response to augmentation. Calf Veins: No evidence of thrombus. Normal  compressibility and flow on color Doppler imaging. Superficial Great Saphenous Vein: No evidence of thrombus. Normal compressibility and flow on color Doppler imaging. Venous Reflux:  None. Other Findings:  None. IMPRESSION: No evidence of left lower extremity deep venous thrombosis. Electronically Signed   By: Van Clines M.D.   On: 07/21/2016 17:26    1820:  Labs per baseline. Pt has tol PO abx, as well as food/fluids, well without N/V. Pt does not want to be admitted and wants to go home "right now." Family also wants to go home. Strict return precautions given. Dx and testing d/w pt and family.  Questions answered.  Verb understanding, agreeable to d/c home with outpt f/u.      Final Clinical Impressions(s) / ED Diagnoses   Final diagnoses:  None    New Prescriptions New Prescriptions   No medications on file      Francine Graven, DO 07/23/16 2019

## 2016-07-21 NOTE — Discharge Instructions (Signed)
Take the prescription as directed.  Call your regular medical doctor tomorrow to schedule a follow up appointment within the next 2 days.  Return to the Emergency Department immediately sooner if worsening.  ° °

## 2016-07-21 NOTE — ED Triage Notes (Signed)
Pt reports he was admitted last week for hyperglycemia and was receiving wound care for wounds on both lower legs.  Reports left leg hurts worse than r.

## 2016-07-21 NOTE — ED Notes (Signed)
US at the bedside

## 2016-07-24 ENCOUNTER — Other Ambulatory Visit: Payer: Self-pay | Admitting: *Deleted

## 2016-07-24 ENCOUNTER — Encounter: Payer: Self-pay | Admitting: *Deleted

## 2016-07-24 NOTE — Patient Outreach (Signed)
[facility]) Care Management   07/24/2016  Chris [name] April 08, 1941 [id number]  Chris [name] is an 75 y.o. male  Subjective: Initial home visit with pt, HIPAA verified, pt states home health PT and RN working with him and RN "wrapping my legs".  Pt reports blood sugar still up and down, has recently stopped recording blood sugar and is not weighing daily or recording.  Pt reports he drinks fruit juice and "eats things I shouldn't".  Objective:   Vitals:   07/24/16 1304  BP: 118/60  Pulse: 98  Resp: 18  Weight: 210 lb (95.3 kg)  Height: 1.854 m (_0 )   ROS  Physical Exam  Constitutional: He is oriented to person, place, and time. He appears well-developed and well-nourished.  HENT:  Head: Normocephalic.  Neck: Normal range of motion. Neck supple.  Cardiovascular: Normal rate.   Irregular rhythm   Respiratory: Breath sounds normal. No respiratory distress.  GI: Soft. Bowel sounds are normal.  Pt has chronic constipation  Musculoskeletal: Normal range of motion.  Patient's legs wrapped with profore LE BL (placed by home health)  Neurological: He is alert and oriented to person, place, and time.  Skin: Skin is warm and dry.  Psychiatric: He has a normal mood and affect. His behavior is normal. Thought content normal.    Encounter Medications:   Outpatient Encounter Prescriptions as of 07/24/2016  Medication Sig Note  . alum & mag hydroxide-simeth (MAALOX/MYLANTA) 200-200-20 MG/5ML suspension Take 30 mLs by mouth daily as needed for indigestion or heartburn.    Marland Kitchen atorvastatin (LIPITOR) 40 MG tablet Take 40 mg by mouth daily at 6 PM.    . Cholecalciferol (VITAMIN D-3) 5000 UNITS TABS Take 5,000 Units by mouth daily.    . clindamycin (CLEOCIN) 150 MG capsule 3 tabs PO TID x 10 days   . insulin detemir (LEVEMIR) 100 UNIT/ML injection Inject 40-50 Units into the skin every morning.  03/28/2016: 47 units this morning--very rarely, patient states that he will take  10 units in the evening  . metoprolol succinate (TOPROL-XL) 50 MG 24 hr tablet Take 1 tablet (50 mg total) by mouth 2 (two) times daily. Take with or immediately following a meal.   . Multiple Vitamin (MULTIVITAMIN WITH MINERALS) TABS tablet Take 1 tablet by mouth daily.   . nitroGLYCERIN (NITROSTAT) 0.4 MG SL tablet Place 1 tablet (0.4 mg total) under the tongue every 5 (five) minutes as needed for chest pain.   Marland Kitchen ondansetron (ZOFRAN) 4 MG tablet Take 4 mg by mouth every 8 (eight) hours as needed for nausea or vomiting.   . potassium chloride SA (K-DUR,KLOR-CON) 20 MEQ tablet Take 1 tablet (20 mEq total) by mouth daily.   . 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   . ranitidine (ZANTAC) 150 MG tablet Take 150 mg by mouth 2 (two) times daily. Reported on 03/07/2016   . torsemide (DEMADEX) 20 MG tablet Take 2 tablets (40 mg total) by mouth 2 (two) times daily.   Marland Kitchen triamcinolone cream (KENALOG) 0.1 % APPLY TO AFFECTED AREA DAILY AS DIRECTED (Patient taking differently: Apply 1 application topically daily as needed (for rash). )   . aspirin EC 81 MG tablet Take 81 mg by mouth daily. 07/21/2016: Has not taken and was advised to hold due to nose bleeds  . LORazepam (ATIVAN) 0.5 MG tablet Take 0.5 mg  by mouth 2 (two) times daily as needed for anxiety.    No facility-administered encounter medications on file as of 07/24/2016.     Functional Status:   In your present state of health, do you have any difficulty performing the following activities: 07/24/2016 07/13/2016  Hearing? N N  Vision? N N  Difficulty concentrating or making decisions? [name]  Walking or climbing stairs? Y Y  Dressing or bathing? Y N  Doing errands, shopping? [name]  Preparing Food and eating ? Y -  Using the Toilet? N -  In the past six months, have you accidently leaked urine? N -  Do you have problems with loss of  bowel control? N -  Managing your Medications? Y -  Managing your Finances? Y -  Housekeeping or managing your Housekeeping? Y -  Some recent data might be hidden    Fall/Depression Screening:    PHQ 2/9 Scores 07/24/2016 03/07/2016 02/26/2016 04/03/2015 01/16/2015  PHQ - 2 Score 0 0 0 0 0   Fall Risk  07/24/2016 06/24/2016 03/27/2016 03/07/2016 02/26/2016  Falls in the past year? No Yes Yes Yes Yes  Number falls in past yr: - _0 Injury with Fall? - No No No No  Risk for fall due to : Impaired balance/gait;Medication side effect Impaired balance/gait;History of fall(s) Impaired balance/gait;History of fall(s) Impaired balance/gait;History of fall(s);Medication side effect Impaired balance/gait;History of fall(s);Medication side effect  Risk for fall due to (comments): - - - - -  Follow up - Education provided;Falls prevention discussed Education provided;Falls prevention discussed Education provided;Falls prevention discussed Education provided    Assessment:  Pt not following plan of care as ordered by MD, not weighing daily and recording, not recording CBG or following diet.  Pt says he has been diabetic "for years" and "does things the way he feels is best".  Wife oversees medications- does not want to use prefilled med box, RN  CM observed medication bottles and reviewed with pt and wife, wife is overseeing pt administering insulin as pt forgot to take insulin and CBG was over 500 few weeks ago.   RN CM faxed today's initial home visit and barrier letter to primary MD Dr. Quillian Quince.  [facility] CM Care Plan Problem One   Flowsheet Row Most Recent Value  Care Plan Problem One  Knowledge deficit related to CHF  Role Documenting the Problem One  Care Management Coordinator  Care Plan for Problem One  Active  THN Long Term Goal (31-90 days)  Pt will verbalize better understanding of CHF to facilitate better outcomes and reduce hospitalizations within 90 days.  THN Long Term Goal Start Date  07/24/16   Interventions for Problem One Long Term Goal  RN CM observed and reviewed medications with pt and wife and importance of taking all as prescribed. Pt has not scheduled post hospital appointment with primary MD and states he is not going to as he feels this is not necessary.  THN CM Short Term Goal #1 (0-30 days)  pt will verbalize CHF zones within 30 days  THN CM Short Term Goal #1 Start Date  07/24/16  [facility] CM Short Term Goal #1 Met Date  07/24/16  Interventions for Short Term Goal #1  RN CM reviewed CHF zones with emphasis on yellow zone.  THN CM Short Term Goal #2 (0-30 days)  Pt will weigh daily and log within 30 days.  THN CM Short Term Goal #2 Start Date  07/24/16  Interventions  for Short Term Goal #2  RN CM ask pt to weigh daily and record and reviewed correlation of weight gain with CHF and outcomes    [facility] CM Care Plan Problem Two   Flowsheet Row Most Recent Value  Care Plan Problem Two  Hyperglycemia with Hgb AIC 9.7 on 07/14/16  Role Documenting the Problem Two  Care Management Coordinator  Care Plan for Problem Two  Active  THN CM Short Term Goal #1 (0-30 days)  Pt will log blood sugar daily within 30 days.  THN CM Short Term Goal #1 Start Date  07/24/16  Interventions for Short Term Goal #2   RN CM ask pt to record CBG daily and take to next MD appointment with primary care MD.      Plan: continue weekly transition of care calls See pt for home visit December Assess CBG and weight log  Jacqlyn Larsen [facility], [place] Coordinator [phone]

## 2016-07-31 ENCOUNTER — Other Ambulatory Visit: Payer: Self-pay | Admitting: *Deleted

## 2016-07-31 NOTE — Patient Outreach (Signed)
07/31/16- Telephone call to patient's home for transition of care week 3, spoke with wife Norwood Quezada, wife reports home health continues seeing pt and providing wound care. CBG today 253 after eating spaghetti and butter beans last night, does not know weight as pt still not weighing daily.  Rn CM discussed with wife carbohydrate modified diet.  [facility] CM Care Plan Problem One   Flowsheet Row Most Recent Value  Care Plan Problem One  Knowledge deficit related to CHF  Role Documenting the Problem One  Care Management Coordinator  Care Plan for Problem One  Active  THN Long Term Goal (31-90 days)  Pt will verbalize better understanding of CHF to facilitate better outcomes and reduce hospitalizations within 90 days.  THN Long Term Goal Start Date  07/24/16  Interventions for Problem One Long Term Goal  RN CM reviewed all upcoming appointments with wife, cardiologist 08/01/16 and primary care 08/07/16 and also to have bloodwork done.  THN CM Short Term Goal #1 (0-30 days)  pt will verbalize CHF zones within 30 days  THN CM Short Term Goal #1 Start Date  07/24/16  [facility] CM Short Term Goal #1 Met Date  07/24/16  Interventions for Short Term Goal #1  RN CM reiterated CHF zones  THN CM Short Term Goal #2 (0-30 days)  Pt will weigh daily and log within 30 days.  THN CM Short Term Goal #2 Start Date  07/24/16  Interventions for Short Term Goal #2  RN CM reminded wife about importance of pt weighing daily    [facility] CM Care Plan Problem Two   Flowsheet Row Most Recent Value  Care Plan Problem Two  Hyperglycemia with Hgb AIC 9.7 on 07/14/16  Role Documenting the Problem Two  Care Management Coordinator  Care Plan for Problem Two  Active  THN CM Short Term Goal #1 (0-30 days)  Pt will log blood sugar daily within 30 days.  THN CM Short Term Goal #1 Start Date  07/24/16  Interventions for Short Term Goal #2   Rn CM reminded wife of importance of pt logging CBG daily, wife states pt doing this some of the time       PLAN Continue weekly transition of care phone calls  Jacqlyn Larsen [facility], [place] Coordinator [phone]

## 2016-08-01 ENCOUNTER — Encounter: Payer: Commercial Managed Care - HMO | Admitting: Adult Health

## 2016-08-01 ENCOUNTER — Encounter: Payer: Self-pay | Admitting: Adult Health

## 2016-08-01 NOTE — Progress Notes (Deleted)
Cardiology Office Note   Date:  08/01/2016   ID:  Chris [name], DOB 07/21/1941, MRN [id number]  PCP:  Gar Ponto, MD  Cardiologist: [place]/L  Jory Sims, NP   No chief complaint on file.     History of Present Illness: Chris [name] is a 75 y.o. male who presents for ongoing assessment and management of chronic systolic heart failure, [place] Heart Association class III, chronic atrial fibrillation, coronary artery disease, with history of poorly controlled diabetes, anemia and chronic kidney disease, stasis dermatitis of both legs. He was last seen by cardiology on 07/17/2016 in an ER evaluation. The patient was sent home on 40 mg twice a day of Lasix, with consideration to increase to 60 mg twice a day or change to torsemide 40 mg twice a day on follow-up. He was to follow-up with his primary care concerning diabetes control his hemoglobin A1c was 9.7. He was found to be forgetful concerning his insulin and blood sugar management.  Concerning atrial fibrillation, the patient was switched from Lopressor to Toprol-XL 50 mg twice a day secondary cardiomyopathy and for better heart rate control. He was not placed on anticoagulation therapy due to history of CVA with hemorrhagic conversion and epistaxis. He was to continue to follow with the wound clinic in [place] concerning his chronic venous stasis dermatitis and ulcerations. He was not found to be a candidate for an ACE inhibitor or ARB due to his renal dysfunction.    Past Medical History:  Diagnosis Date  . Acute cerebrovascular accident [facility])    Presumed embolic from his atrial fibrillation with hemorrhagic conbersion  . Atrial fibrillation (HCC)    Not anticoagulated - chronic epistaxis as well as hemorrhagic conversion of prior stroke  . CAD (coronary artery disease)    Multivessel status post CABG  . Chronic leg pain   . Chronic systolic heart failure ([place])   . Confusion   . Essential hypertension   . History of  medication noncompliance    "due to confusion"  . Hyperlipidemia   . Ischemic cardiomyopathy    LVEF 35-40%  . Macrocytosis    Normal B12 & folate levels  . Myocardial infarct   . Pedal edema   . Pleural effusion 10/05/2013  . Tubular adenoma   . Type 2 diabetes mellitus ([place])     Past Surgical History:  Procedure Laterality Date  . CHEST TUBE INSERTION Right 10/31/2013   Procedure: INSERTION PLEURAL DRAINAGE CATHETER;  Surgeon: Melrose Nakayama, MD;  Location: [place];  Service: Thoracic;  Laterality: Right;  . CHOLECYSTECTOMY  2008  . COLONOSCOPY N/A 12/23/2012   Dr. Gala Romney: multiple rectal and colonic polyps removed, tubular adenomas. needs surveillance April 2017.   Marland Kitchen CORONARY ARTERY BYPASS GRAFT    . ESOPHAGOGASTRODUODENOSCOPY (EGD) WITH ESOPHAGEAL DILATION N/A 10/27/2012   Dr. Gala Romney: distal esophageal diverticulum, non-complete Schatzki's ring s/p dilation, distal esophageal nodule with benign path, negative Barrett's  . PLEURAL EFFUSION DRAINAGE Right 10/31/2013   Procedure: DRAINAGE OF PLEURAL EFFUSION;  Surgeon: Melrose Nakayama, MD;  Location: [place];  Service: Thoracic;  Laterality: Right;  . TONSILLECTOMY    . VIDEO ASSISTED THORACOSCOPY Right 10/31/2013   Procedure: VIDEO ASSISTED THORACOSCOPY;  Surgeon: Melrose Nakayama, MD;  Location: [place];  Service: Thoracic;  Laterality: Right;     Current Outpatient Prescriptions  Medication Sig Dispense Refill  . alum & mag hydroxide-simeth (MAALOX/MYLANTA) 200-200-20 MG/5ML suspension Take 30 mLs by mouth daily as needed for indigestion  or heartburn.     Marland Kitchen aspirin EC 81 MG tablet Take 81 mg by mouth daily.    Marland Kitchen atorvastatin (LIPITOR) 40 MG tablet Take 40 mg by mouth daily at 6 PM.     . Cholecalciferol (VITAMIN D-3) 5000 UNITS TABS Take 5,000 Units by mouth daily.     . clindamycin (CLEOCIN) 150 MG capsule 3 tabs PO TID x 10 days 90 capsule 0  . insulin detemir (LEVEMIR) 100 UNIT/ML injection Inject 40-50 Units into the skin  every morning.     Marland Kitchen LORazepam (ATIVAN) 0.5 MG tablet Take 0.5 mg by mouth 2 (two) times daily as needed for anxiety.    . metoprolol succinate (TOPROL-XL) 50 MG 24 hr tablet Take 1 tablet (50 mg total) by mouth 2 (two) times daily. Take with or immediately following a meal. 60 tablet 0  . Multiple Vitamin (MULTIVITAMIN WITH MINERALS) TABS tablet Take 1 tablet by mouth daily.    . nitroGLYCERIN (NITROSTAT) 0.4 MG SL tablet Place 1 tablet (0.4 mg total) under the tongue every 5 (five) minutes as needed for chest pain. 25 tablet 3  . ondansetron (ZOFRAN) 4 MG tablet Take 4 mg by mouth every 8 (eight) hours as needed for nausea or vomiting.    . potassium chloride SA (K-DUR,KLOR-CON) 20 MEQ tablet Take 1 tablet (20 mEq total) by mouth daily. 30 tablet 0  . 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    . ranitidine (ZANTAC) 150 MG tablet Take 150 mg by mouth 2 (two) times daily. Reported on 03/07/2016    . torsemide (DEMADEX) 20 MG tablet Take 2 tablets (40 mg total) by mouth 2 (two) times daily. 60 tablet 0  . triamcinolone cream (KENALOG) 0.1 % APPLY TO AFFECTED AREA DAILY AS DIRECTED (Patient taking differently: Apply 1 application topically daily as needed (for rash). ) 30 g 0   No current facility-administered medications for this visit.     Allergies:   Ciprofloxacin; Finasteride; Lisinopril; Metoclopramide; Protonix [pantoprazole sodium]; and Xanax [alprazolam]    Social History:  The patient  reports that he has never smoked. He has never used smokeless tobacco. He reports that he does not drink alcohol or use drugs.   Family History:  The patient's family history includes Colon polyps in his sister and sister.    ROS: All other systems are reviewed and negative. Unless otherwise mentioned in H&P    PHYSICAL EXAM: VS:  There were no vitals taken for this visit. , BMI There is  no height or weight on file to calculate BMI. GEN: Well nourished, well developed, in no acute distress  HEENT: normal  Neck: no JVD, carotid bruits, or masses Cardiac: ***RRR; no murmurs, rubs, or gallops,no edema  Respiratory:  clear to auscultation bilaterally, normal work of breathing GI: soft, nontender, nondistended, + BS MS: no deformity or atrophy  Skin: warm and dry, no rash Neuro:  Strength and sensation are intact Psych: euthymic mood, full affect   EKG:  EKG {ACTION; IS/IS [id number] ordered today. The ekg ordered today demonstrates ***   Recent Labs: 07/13/2016: ALT 18; B Natriuretic Peptide 506.0 07/21/2016: BUN 66; Creatinine, Ser 2.87; Hemoglobin 10.6; Platelets 175; Potassium 4.2; Sodium 135    Lipid Panel    Component Value Date/Time   CHOL  09/17/2009 0507    114        ATP III  CLASSIFICATION:  <200     mg/dL   Desirable  200-239  mg/dL   Borderline High  >=240    mg/dL   High          TRIG 84 09/17/2009 0507   HDL 48 09/17/2009 0507   CHOLHDL 2.4 09/17/2009 0507   VLDL 17 09/17/2009 0507   LDLCALC  09/17/2009 0507    49        Total Cholesterol/HDL:CHD Risk Coronary Heart Disease Risk Table                     Men   Women  1/2 Average Risk   3.4   3.3  Average Risk       5.0   4.4  2 X Average Risk   9.6   7.1  3 X Average Risk  23.4   11.0        Use the calculated Patient Ratio above and the CHD Risk Table to determine the patient's CHD Risk.        ATP III CLASSIFICATION (LDL):  <100     mg/dL   Optimal  100-129  mg/dL   Near or Above                    Optimal  130-159  mg/dL   Borderline  160-189  mg/dL   High  >190     mg/dL   Very High      Wt Readings from Last 3 Encounters:  07/24/16 210 lb (95.3 kg)  07/21/16 210 lb (95.3 kg)  07/17/16 210 lb (95.3 kg)      Other studies Reviewed: Additional studies/ records that were reviewed today include: ***. Review of the above records demonstrates: ***   ASSESSMENT AND  PLAN:  1.  ***   Current medicines are reviewed at length with the patient today.    Labs/ tests ordered today include: *** No orders of the defined types were placed in this encounter.    Disposition:   FU with *** in {gen number [id number] {TIME; UNITS DAY/WEEK/MONTH:19136}   Signed, Jory Sims, NP  08/01/2016 7:01 AM    [place]. [address], [place], [place] [address] Phone: [phone]; Fax: [phone]

## 2016-08-07 ENCOUNTER — Other Ambulatory Visit: Payer: Self-pay | Admitting: *Deleted

## 2016-08-07 NOTE — Patient Outreach (Signed)
Telephone call to patient for transition of care week 4, spoke with wife Iris, HIPAA verified, Iris states pt has not been weighing lately, CBG today 200, home health continues once weekly with plan of care for profore dressing and pt refuses to let home health RN apply profore and if profore is applied, pt removes most of time.  Wife states pt "is very stubborn and does what he wants to"  Wife is not sure pt will keep primary MD appointment today.  Pt does not seem ready to change at present even with urging and prompting, encouraging from wife and family.  [facility] CM Care Plan Problem One   Flowsheet Row Most Recent Value  Care Plan Problem One  Knowledge deficit related to CHF  Role Documenting the Problem One  Care Management Coordinator  Care Plan for Problem One  Active  THN Long Term Goal (31-90 days)  Pt will verbalize better understanding of CHF to facilitate better outcomes and reduce hospitalizations within 90 days.  THN Long Term Goal Start Date  07/24/16  Interventions for Problem One Long Term Goal  RN CM reminded of pt appointment with primary MD today 08/07/16, wife states pt may not go  THN CM Short Term Goal #1 (0-30 days)  pt will verbalize CHF zones within 30 days  THN CM Short Term Goal #1 Start Date  07/24/16  Interventions for Short Term Goal #1  RN CM reinforced CHF zones  THN CM Short Term Goal #2 (0-30 days)  Pt will weigh daily and log within 30 days.  THN CM Short Term Goal #2 Start Date  07/24/16  Interventions for Short Term Goal #2  RN CM reinforced with wife about importance of pt weighing daily    [facility] CM Care Plan Problem Two   Flowsheet Row Most Recent Value  Care Plan Problem Two  Hyperglycemia with Hgb AIC 9.7 on 07/14/16  Role Documenting the Problem Two  Care Management Coordinator  Care Plan for Problem Two  Active  THN CM Short Term Goal #1 (0-30 days)  Pt will log blood sugar daily within 30 days.  THN CM Short Term Goal #1 Start Date  07/24/16   Interventions for Short Term Goal #2   Rn CM reminded wife of importance of pt logging CBG daily, wife states pt doing this some of the time      PLAN See pt for home visit in December  Jacqlyn Larsen [facility], [place] Coordinator [phone]

## 2016-08-26 ENCOUNTER — Other Ambulatory Visit: Payer: Self-pay | Admitting: *Deleted

## 2016-08-26 ENCOUNTER — Encounter: Payer: Self-pay | Admitting: *Deleted

## 2016-08-26 NOTE — Patient Outreach (Addendum)
[facility]) Care Management   08/26/2016  Chris [name] Dec 05, 1940 [id number]  Chris [name] is an 75 y.o. male  Subjective: Routine home visit with pt, wife present, HIPAA verified, pt reports he has stopped weighing, home health continues once weekly and wrapping legs with profore and pt states he "takes the wrapping off sometime"  Pt refused to attend his last primary MD appointment, wife is upset with pt that "he has stopped doing everything and doesn't care"    Objective:   Vitals:   08/26/16 1243  BP: 116/64  Pulse: 76  Resp: 18  SpO2: 93%  CBG 237 fasting   ROS  Physical Exam  Constitutional: He is oriented to person, place, and time. He appears well-developed and well-nourished.  HENT:  Head: Normocephalic.  Neck: Normal range of motion. Neck supple.  Cardiovascular: Normal rate.   Respiratory: Effort normal.  bil breath sounds diminished  GI: Soft. Bowel sounds are normal.  Musculoskeletal: Normal range of motion. He exhibits edema.  2+ edema in legs, thighs, abdomen swollen  Neurological: He is alert and oriented to person, place, and time.  Skin: Skin is warm and dry.  Psychiatric:  Pt defensive about his health conditions, not participating in plan of care.    Encounter Medications:   Outpatient Encounter Prescriptions as of 08/26/2016  Medication Sig Note  . alum & mag hydroxide-simeth (MAALOX/MYLANTA) 200-200-20 MG/5ML suspension Take 30 mLs by mouth daily as needed for indigestion or heartburn.    Marland Kitchen atorvastatin (LIPITOR) 40 MG tablet Take 40 mg by mouth daily at 6 PM.    . Cholecalciferol (VITAMIN D-3) 5000 UNITS TABS Take 5,000 Units by mouth daily.    . insulin detemir (LEVEMIR) 100 UNIT/ML injection Inject 40-50 Units into the skin every morning.  03/28/2016: 47 units this morning--very rarely, patient states that he will take 10 units in the evening  . LORazepam (ATIVAN) 0.5 MG tablet Take 0.5 mg by mouth 2 (two) times daily as needed  for anxiety.   . metoprolol succinate (TOPROL-XL) 50 MG 24 hr tablet Take 1 tablet (50 mg total) by mouth 2 (two) times daily. Take with or immediately following a meal.   . Multiple Vitamin (MULTIVITAMIN WITH MINERALS) TABS tablet Take 1 tablet by mouth daily.   . nitroGLYCERIN (NITROSTAT) 0.4 MG SL tablet Place 1 tablet (0.4 mg total) under the tongue every 5 (five) minutes as needed for chest pain.   Marland Kitchen ondansetron (ZOFRAN) 4 MG tablet Take 4 mg by mouth every 8 (eight) hours as needed for nausea or vomiting.   . potassium chloride SA (K-DUR,KLOR-CON) 20 MEQ tablet Take 1 tablet (20 mEq total) by mouth daily.   . 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   . torsemide (DEMADEX) 20 MG tablet Take 2 tablets (40 mg total) by mouth 2 (two) times daily.   Marland Kitchen triamcinolone cream (KENALOG) 0.1 % APPLY TO AFFECTED AREA DAILY AS DIRECTED (Patient taking differently: Apply 1 application topically daily as needed (for rash). )   . aspirin EC 81 MG tablet Take 81 mg by mouth daily. 07/21/2016: Has not taken and was advised to hold due to nose bleeds  . ranitidine (ZANTAC) 150 MG tablet Take 150 mg by mouth 2 (two) times daily. Reported on 03/07/2016   . [DISCONTINUED] clindamycin (CLEOCIN) 150 MG capsule 3 tabs PO TID x 10 days  No facility-administered encounter medications on file as of 08/26/2016.     Functional Status:   In your present state of health, do you have any difficulty performing the following activities: 07/24/2016 07/13/2016  Hearing? N N  Vision? N N  Difficulty concentrating or making decisions? [name]  Walking or climbing stairs? Y Y  Dressing or bathing? Y N  Doing errands, shopping? [name]  Preparing Food and eating ? Y -  Using the Toilet? N -  In the past six months, have you accidently leaked urine? N -  Do you have problems with loss of bowel control? N -  Managing your  Medications? Y -  Managing your Finances? Y -  Housekeeping or managing your Housekeeping? Y -  Some recent data might be hidden    Fall/Depression Screening:    PHQ 2/9 Scores 07/24/2016 03/07/2016 02/26/2016 04/03/2015 01/16/2015  PHQ - 2 Score 0 0 0 0 0    Assessment:  Pt has stopped weighing and attending appointments, he is fussy with wife and is being difficult today and wife states pt is almost unbearable at times with his attitude.  RN CM reviewed medications, RN CM called Dr. Arcola Jansky office, spoke with Loma Sousa and reported pt has at least 2+ edema in legs, upper thighs, abdomen swollen.  Loma Sousa will let MD know and call patient/ wife back with instructions. Wife does not want another level of care for pt at present but is having difficult time due to pt appearing not to care about his health, does not want to attend any appointments, will not weigh, does not eat properly, talks rudely to his wife when she tries to help.  RN CM called [facility], spoke with [facility] RN and gave report of the above information and she will share with the nurses seeing pt in the home.  [facility] CM Care Plan Problem One   Flowsheet Row Most Recent Value  Care Plan Problem One  Knowledge deficit related to CHF  Role Documenting the Problem One  Care Management Coordinator  Care Plan for Problem One  Active  THN Long Term Goal (31-90 days)  Pt will verbalize better understanding of CHF to facilitate better outcomes and reduce hospitalizations within 90 days.  THN Long Term Goal Start Date  07/24/16  Interventions for Problem One Long Term Goal  RN CM called Dr. Arcola Jansky office, spoke with Loma Sousa, reported pt has 2+ edema in thigh area and leg area above profore dressing, has edema in abdomen and pt is refusing to weigh.Loma Sousa will let MD know and will call patient's wife as MD may want to see pt in office.  THN CM Short Term Goal #1 (0-30 days)  pt will verbalize CHF zones within 30 days  THN  CM Short Term Goal #1 Start Date  08/23/16 [goal restarted]  Interventions for Short Term Goal #1  RN CM reiterated CHF zones/ action plan  THN CM Short Term Goal #2 (0-30 days)  Pt will weigh daily and log within 30 days.  THN CM Short Term Goal #2 Start Date  08/23/16  Interventions for Short Term Goal #2  RN CM reiterated with wife about importance of pt weighing daily    [facility] CM Care Plan Problem Two   Flowsheet Row Most Recent Value  Care Plan Problem Two  Hyperglycemia with Hgb AIC 9.7 on 07/14/16  Role Documenting the Problem Two  Care Management Coordinator  Care Plan for Problem Two  Active  THN CM Short Term Goal #1 (0-30 days)  Pt will log blood sugar daily within 30 days.  THN CM Short Term Goal #1 Start Date  07/24/16  [facility] CM Short Term Goal #1 Met Date   08/26/16  Interventions for Short Term Goal #2   Rn CM reminded wife of importance of pt logging CBG daily, wife states pt doing this some of the time      Plan:  Call patient's wife tomorrow for follow up after talking with MD follow up with home visit in January Collaborate with home health as needed Follow up on edema, MD visit  Jacqlyn Larsen [facility], [place] Coordinator [phone]

## 2016-08-27 ENCOUNTER — Other Ambulatory Visit: Payer: Self-pay | Admitting: *Deleted

## 2016-08-27 NOTE — Patient Outreach (Signed)
Telephone call to patient, spoke with wife Chris [name], HIPAA verified, RN CM called for follow up from yesterday's visit, wife states primary care MD Dr. Arcola Jansky office called back and ask pt to take extra diuretic medication, wife states "he's only taken one and was up all night going to bathroom and now refusing to take any more"  RN CM ask wife to talk to pt about taking the diuretic this morning early as possible to avoid being up overnight.  Wife states her son is at their home and trying to talk with pt, wife states pt is becoming more stubborn, also states if pt will not take the diuretic and edema does not improve, she will call EMS and have pt taken to ED.  RN CM ask patient's wife to call if any questions or concerns, wife also verbalizes understanding of calling MD with questions - pt also refuses to go into MD office. Home health also seeing pt this week.  PLAN Call next week, follow up with wife.  Jacqlyn Larsen [facility], [place] Coordinator [phone]

## 2016-09-01 ENCOUNTER — Other Ambulatory Visit: Payer: Self-pay | Admitting: *Deleted

## 2016-09-01 ENCOUNTER — Ambulatory Visit: Payer: Self-pay | Admitting: *Deleted

## 2016-09-01 NOTE — Patient Outreach (Signed)
Telephone call to patient's wife Chris [name] for follow up on weight and edema. Spoke with wife, HIPAA verified, wife states "he's still swollen some but it has improved"   reports pt did take extra "fluid pills"   Pt still not weighing, home health RN is seeing pt today and will be weighing pt and continues wrapping legs. RN CM reviewed CHF action plan. Wife verbalizes understanding of calling RN CM, home health, MD, 911 for change in health status/ symptoms.  Jacqlyn Larsen [facility], [place] Coordinator [phone]

## 2016-09-04 ENCOUNTER — Other Ambulatory Visit: Payer: Self-pay | Admitting: *Deleted

## 2016-09-04 NOTE — Patient Outreach (Signed)
Patient's wife Chris [name] called and states pt needs a wheelchair to be able to leave home and would also like a hover round to be able to get through the house, Chris [name] states her granddaughter has been assisting with this but pt not sure how far along the process has gotten.   Rn CM called Grandfather, spoke with Chris Evens RN who reports PT will be doing evaluation on pt 09/08/16.  Rn CM informed Chris [name] of this information and PT will assist with obtaining the wheelchair.  Chris [name] [facility], [place] Coordinator [phone]

## 2016-09-05 ENCOUNTER — Encounter (HOSPITAL_COMMUNITY): Payer: Self-pay | Admitting: Emergency Medicine

## 2016-09-05 ENCOUNTER — Inpatient Hospital Stay (HOSPITAL_COMMUNITY)
Admission: EM | Admit: 2016-09-05 | Discharge: 2016-09-09 | DRG: 291 | Disposition: A | Discharge status: Home / Self Care | Payer: Commercial Managed Care - HMO | Attending: Internal Medicine | Admitting: Internal Medicine

## 2016-09-05 DIAGNOSIS — Z794 Long term (current) use of insulin: Secondary | ICD-10-CM

## 2016-09-05 DIAGNOSIS — L97919 Non-pressure chronic ulcer of unspecified part of right lower leg with unspecified severity: Secondary | ICD-10-CM | POA: Diagnosis present

## 2016-09-05 DIAGNOSIS — E1165 Type 2 diabetes mellitus with hyperglycemia: Secondary | ICD-10-CM | POA: Diagnosis present

## 2016-09-05 DIAGNOSIS — E11622 Type 2 diabetes mellitus with other skin ulcer: Secondary | ICD-10-CM | POA: Diagnosis present

## 2016-09-05 DIAGNOSIS — I2510 Atherosclerotic heart disease of native coronary artery without angina pectoris: Secondary | ICD-10-CM | POA: Diagnosis present

## 2016-09-05 DIAGNOSIS — R601 Generalized edema: Secondary | ICD-10-CM

## 2016-09-05 DIAGNOSIS — N184 Chronic kidney disease, stage 4 (severe): Secondary | ICD-10-CM | POA: Diagnosis present

## 2016-09-05 DIAGNOSIS — M6281 Muscle weakness (generalized): Secondary | ICD-10-CM

## 2016-09-05 DIAGNOSIS — I252 Old myocardial infarction: Secondary | ICD-10-CM

## 2016-09-05 DIAGNOSIS — Z888 Allergy status to other drugs, medicaments and biological substances status: Secondary | ICD-10-CM

## 2016-09-05 DIAGNOSIS — Z79899 Other long term (current) drug therapy: Secondary | ICD-10-CM

## 2016-09-05 DIAGNOSIS — N179 Acute kidney failure, unspecified: Secondary | ICD-10-CM | POA: Diagnosis not present

## 2016-09-05 DIAGNOSIS — E8889 Other specified metabolic disorders: Secondary | ICD-10-CM | POA: Diagnosis present

## 2016-09-05 DIAGNOSIS — Z881 Allergy status to other antibiotic agents status: Secondary | ICD-10-CM

## 2016-09-05 DIAGNOSIS — I5023 Acute on chronic systolic (congestive) heart failure: Secondary | ICD-10-CM | POA: Diagnosis not present

## 2016-09-05 DIAGNOSIS — E785 Hyperlipidemia, unspecified: Secondary | ICD-10-CM | POA: Diagnosis present

## 2016-09-05 DIAGNOSIS — Z9114 Patient's other noncompliance with medication regimen: Secondary | ICD-10-CM

## 2016-09-05 DIAGNOSIS — Z7982 Long term (current) use of aspirin: Secondary | ICD-10-CM

## 2016-09-05 DIAGNOSIS — Z9049 Acquired absence of other specified parts of digestive tract: Secondary | ICD-10-CM

## 2016-09-05 DIAGNOSIS — Z8673 Personal history of transient ischemic attack (TIA), and cerebral infarction without residual deficits: Secondary | ICD-10-CM

## 2016-09-05 DIAGNOSIS — E1121 Type 2 diabetes mellitus with diabetic nephropathy: Secondary | ICD-10-CM | POA: Diagnosis present

## 2016-09-05 DIAGNOSIS — I130 Hypertensive heart and chronic kidney disease with heart failure and stage 1 through stage 4 chronic kidney disease, or unspecified chronic kidney disease: Principal | ICD-10-CM | POA: Diagnosis present

## 2016-09-05 DIAGNOSIS — I255 Ischemic cardiomyopathy: Secondary | ICD-10-CM | POA: Diagnosis present

## 2016-09-05 DIAGNOSIS — Z951 Presence of aortocoronary bypass graft: Secondary | ICD-10-CM

## 2016-09-05 DIAGNOSIS — L97929 Non-pressure chronic ulcer of unspecified part of left lower leg with unspecified severity: Secondary | ICD-10-CM | POA: Diagnosis present

## 2016-09-05 DIAGNOSIS — K219 Gastro-esophageal reflux disease without esophagitis: Secondary | ICD-10-CM | POA: Diagnosis present

## 2016-09-05 DIAGNOSIS — I482 Chronic atrial fibrillation: Secondary | ICD-10-CM | POA: Diagnosis present

## 2016-09-05 DIAGNOSIS — I509 Heart failure, unspecified: Secondary | ICD-10-CM

## 2016-09-05 DIAGNOSIS — E876 Hypokalemia: Secondary | ICD-10-CM | POA: Diagnosis present

## 2016-09-05 DIAGNOSIS — R2689 Other abnormalities of gait and mobility: Secondary | ICD-10-CM

## 2016-09-05 DIAGNOSIS — I5043 Acute on chronic combined systolic (congestive) and diastolic (congestive) heart failure: Secondary | ICD-10-CM | POA: Diagnosis present

## 2016-09-05 DIAGNOSIS — D631 Anemia in chronic kidney disease: Secondary | ICD-10-CM | POA: Diagnosis present

## 2016-09-05 DIAGNOSIS — E1122 Type 2 diabetes mellitus with diabetic chronic kidney disease: Secondary | ICD-10-CM | POA: Diagnosis present

## 2016-09-05 LAB — COMPREHENSIVE METABOLIC PANEL
ALT: 16 U/L — ABNORMAL LOW (ref 17–63)
ANION GAP: 12 (ref 5–15)
AST: 26 U/L (ref 15–41)
Albumin: 2.8 g/dL — ABNORMAL LOW (ref 3.5–5.0)
Alkaline Phosphatase: 96 U/L (ref 38–126)
BILIRUBIN TOTAL: 1.2 mg/dL (ref 0.3–1.2)
BUN: 82 mg/dL — AB (ref 6–20)
CHLORIDE: 91 mmol/L — AB (ref 101–111)
CO2: 30 mmol/L (ref 22–32)
Calcium: 8.2 mg/dL — ABNORMAL LOW (ref 8.9–10.3)
Creatinine, Ser: 2.73 mg/dL — ABNORMAL HIGH (ref 0.61–1.24)
GFR calc Af Amer: 24 mL/min — ABNORMAL LOW (ref >60)
GFR, EST NON AFRICAN AMERICAN: 21 mL/min — AB (ref >60)
Glucose, Bld: 97 mg/dL (ref 65–99)
POTASSIUM: 3.1 mmol/L — AB (ref 3.5–5.1)
Sodium: 133 mmol/L — ABNORMAL LOW (ref 135–145)
TOTAL PROTEIN: 7.7 g/dL (ref 6.5–8.1)

## 2016-09-05 LAB — CBC WITH DIFFERENTIAL/PLATELET
BASOS ABS: 0 10*3/uL (ref 0.0–0.1)
BASOS PCT: 0 %
EOS PCT: 0 %
Eosinophils Absolute: 0 10*3/uL (ref 0.0–0.7)
HEMATOCRIT: 34.1 % — AB (ref 39.0–52.0)
Hemoglobin: 11.1 g/dL — ABNORMAL LOW (ref 13.0–17.0)
LYMPHS PCT: 11 %
Lymphs Abs: 0.9 10*3/uL (ref 0.7–4.0)
MCH: 34.3 pg — ABNORMAL HIGH (ref 26.0–34.0)
MCHC: 32.6 g/dL (ref 30.0–36.0)
MCV: 105.2 fL — AB (ref 78.0–100.0)
MONO ABS: 0.6 10*3/uL (ref 0.1–1.0)
MONOS PCT: 8 %
Neutro Abs: 6.6 10*3/uL (ref 1.7–7.7)
Neutrophils Relative %: 81 %
PLATELETS: 284 10*3/uL (ref 150–400)
RBC: 3.24 MIL/uL — ABNORMAL LOW (ref 4.22–5.81)
RDW: 14.4 % (ref 11.5–15.5)
WBC: 8.1 10*3/uL (ref 4.0–10.5)

## 2016-09-05 LAB — AMMONIA: AMMONIA: 20 umol/L (ref 9–35)

## 2016-09-05 LAB — BRAIN NATRIURETIC PEPTIDE: B NATRIURETIC PEPTIDE 5: 490 pg/mL — AB (ref 0.0–100.0)

## 2016-09-05 MED ORDER — FUROSEMIDE 10 MG/ML IJ SOLN
80.0000 mg | INTRAMUSCULAR | Status: AC
  Administered 2016-09-05: 80 mg via INTRAVENOUS
  Filled 2016-09-05: qty 8

## 2016-09-05 NOTE — H&P (Signed)
TRH H&P    Patient Demographics:    Chris [name], is a 75 y.o. male  MRN: [id number]  DOB - 11/02/1940  Admit Date - 09/05/2016  Referring MD/NP/PA: Noemi Chapel  Outpatient Primary MD for the patient is Gar Ponto, MD  Patient coming from: Leg swelling  Chief Complaint  Patient presents with  . leg ulcers      HPI:    Chris [name]  is a 75 y.o. male,With a history of systolic heart failure, ischemic cardiac myopathy, EF 35-40%, hypertension, chronic leg wounds to both lower extremities came to the hospital with worsening swelling of lower extremities. Patient is somewhat poor historian, as per ED records patient has not been taking his night Lasix as he did not want to get up at night to urinate. As per medical records patient has had multiple evaluations by cardiology service and home health and given coloration to use his medication as prescribed but patient is noncompliant. Patient has blisters and chronic leg wounds were extremities, he denies any pain or fever. Over the past few days they've noted that the blisters are popping and started to weep. Also noted that, he developed abdominal swelling. Though he denies shortness of breath.  In the ED patient found to be in anasarca, with albumin 2.8, BNP 490. Given 1 dose of Lasix 80 mg IV.  He denies nausea vomiting or diarrhea. No fever or dysuria.    Review of systems:    In addition to the HPI above,  No Fever-chills, No Headache, No changes with Vision or hearing, No problems swallowing food or Liquids, No Chest pain, Cough or Shortness of Breath, No Abdominal pain, No Nausea or Vomiting, bowel movements are regular, No Blood in stool or Urine, No dysuria, No new skin rashes or bruises, No new joints pains-aches,  No new weakness, tingling, numbness in any extremity, No polyuria, polydypsia or polyphagia, No significant Mental Stressors.  A  full 10 point Review of Systems was done, except as stated above, all other Review of Systems were negative.   With Past History of the following :    Past Medical History:  Diagnosis Date  . Acute cerebrovascular accident [facility])    Presumed embolic from his atrial fibrillation with hemorrhagic conbersion  . Atrial fibrillation (HCC)    Not anticoagulated - chronic epistaxis as well as hemorrhagic conversion of prior stroke  . CAD (coronary artery disease)    Multivessel status post CABG  . Chronic leg pain   . Chronic systolic heart failure ([place])   . Confusion   . Essential hypertension   . History of medication noncompliance    "due to confusion"  . Hyperlipidemia   . Ischemic cardiomyopathy    LVEF 35-40%  . Macrocytosis    Normal B12 & folate levels  . Myocardial infarct   . Pedal edema   . Pleural effusion 10/05/2013  . Tubular adenoma   . Type 2 diabetes mellitus ([place])       Past Surgical History:  Procedure Laterality Date  . CHEST  TUBE INSERTION Right 10/31/2013   Procedure: INSERTION PLEURAL DRAINAGE CATHETER;  Surgeon: Melrose Nakayama, MD;  Location: [place];  Service: Thoracic;  Laterality: Right;  . CHOLECYSTECTOMY  2008  . COLONOSCOPY N/A 12/23/2012   Dr. Gala Romney: multiple rectal and colonic polyps removed, tubular adenomas. needs surveillance April 2017.   Marland Kitchen CORONARY ARTERY BYPASS GRAFT    . ESOPHAGOGASTRODUODENOSCOPY (EGD) WITH ESOPHAGEAL DILATION N/A 10/27/2012   Dr. Gala Romney: distal esophageal diverticulum, non-complete Schatzki's ring s/p dilation, distal esophageal nodule with benign path, negative Barrett's  . PLEURAL EFFUSION DRAINAGE Right 10/31/2013   Procedure: DRAINAGE OF PLEURAL EFFUSION;  Surgeon: Melrose Nakayama, MD;  Location: [place];  Service: Thoracic;  Laterality: Right;  . TONSILLECTOMY    . VIDEO ASSISTED THORACOSCOPY Right 10/31/2013   Procedure: VIDEO ASSISTED THORACOSCOPY;  Surgeon: Melrose Nakayama, MD;  Location: [place];  Service:  Thoracic;  Laterality: Right;      Social History:      Social History  Substance Use Topics  . Smoking status: Never Smoker  . Smokeless tobacco: Never Used  . Alcohol use No       Family History :     Family History  Problem Relation Age of Onset  . Colon polyps Sister   . Colon polyps Sister   . Colon cancer Neg Hx       Home Medications:   Prior to Admission medications   Medication Sig Start Date End Date Taking? Authorizing Provider  alum & mag hydroxide-simeth (MAALOX/MYLANTA) 200-200-20 MG/5ML suspension Take 30 mLs by mouth daily as needed for indigestion or heartburn.    Yes Historical Provider, MD  atorvastatin (LIPITOR) 40 MG tablet Take 40 mg by mouth daily at 6 PM.  01/07/14  Yes Historical Provider, MD  Cholecalciferol (VITAMIN D) 2000 units CAPS Take 1 capsule by mouth daily.   Yes Historical Provider, MD  furosemide (LASIX) 40 MG tablet Take 40 mg by mouth 2 (two) times daily.   Yes Historical Provider, MD  LEVEMIR FLEXTOUCH 100 UNIT/ML Pen Inject 35-50 Units into the skin.  07/22/16  Yes Historical Provider, MD  LORazepam (ATIVAN) 0.5 MG tablet Take 0.5 mg by mouth 2 (two) times daily as needed for anxiety.   Yes Historical Provider, MD  metoprolol succinate (TOPROL-XL) 50 MG 24 hr tablet Take 1 tablet (50 mg total) by mouth 2 (two) times daily. Take with or immediately following a meal. 07/17/16  Yes Samuella Cota, MD  Multiple Vitamin (MULTIVITAMIN WITH MINERALS) TABS tablet Take 1 tablet by mouth daily.   Yes Historical Provider, MD  nitroGLYCERIN (NITROSTAT) 0.4 MG SL tablet Place 1 tablet (0.4 mg total) under the tongue every 5 (five) minutes as needed for chest pain. 04/10/16  Yes Herminio Commons, MD  ondansetron (ZOFRAN) 4 MG tablet Take 4 mg by mouth every morning. May take every 8 hours as needed for nausea   Yes Historical Provider, MD  potassium chloride SA (K-DUR,KLOR-CON) 20 MEQ tablet Take 1 tablet (20 mEq total) by mouth daily. 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   Yes Historical Provider, MD  ranitidine (ZANTAC) 150 MG tablet Take 150 mg by mouth 2 (two) times daily as needed for heartburn. Reported on 03/07/2016   Yes Historical Provider, MD  triamcinolone cream (KENALOG) 0.1 % APPLY TO AFFECTED AREA DAILY  AS DIRECTED Patient taking differently: Apply 1 application topically daily as needed (for rash).  03/29/16  Yes Orvan Falconer, MD  torsemide (DEMADEX) 20 MG tablet Take 2 tablets (40 mg total) by mouth 2 (two) times daily. Patient not taking: Reported on 09/05/2016 07/17/16   Samuella Cota, MD     Allergies:     Allergies  Allergen Reactions  . Ciprofloxacin Other (See Comments)    Sores all on legs.   . Finasteride     Affected  Patients testicles  . Lisinopril Other (See Comments)    Kidney damage    . Metoclopramide     Jerking all over  . Protonix [Pantoprazole Sodium]     abd pain  . Xanax [Alprazolam] Other (See Comments)    Not in right state of mind.      Physical Exam:   Vitals  Blood pressure 118/58, pulse 92, temperature 98.4 F (36.9 C), temperature source Temporal, resp. rate 22, height 6' 2.5" (1.892 m), weight 101.2 kg (223 lb), SpO2 99 %.  1.  General: Appears in no acute distress  2. Psychiatric:  Intact judgement and  insight, awake alert, oriented x 3.  3. Neurologic: No focal neurological deficits, all cranial nerves intact.Strength 5/5 all 4 extremities, sensation intact all 4 extremities, plantars down going.  4. Eyes :  anicteric sclerae, moist conjunctivae with no lid lag. PERRLA.  5. ENMT:  Oropharynx clear with moist mucous membranes and good dentition  6. Neck:  supple, no cervical lymphadenopathy appriciated, No thyromegaly  7. Respiratory : Normal respiratory effort, bibasilar crackles  8. Cardiovascular  : RRR, no gallops, rubs or murmurs, 2+ pitting edema of the lower extremities extending up to thighs/abdomen  9. Gastrointestinal:  Positive bowel sounds, abdomen soft, non-tender to palpation,no hepatosplenomegaly, no rigidity or guarding       10. Skin:  Multiple ulcers and blisters noted in both lower extremities, bilateral erythema noted, nontender to palpation, no warmth noted  11.Musculoskeletal:  Good muscle tone,  joints appear normal , no effusions,  normal range of motion    Data Review:    CBC  Recent Labs Lab 09/05/16 1954  WBC 8.1  HGB 11.1*  HCT 34.1*  PLT 284  MCV 105.2*  MCH 34.3*  MCHC 32.6  RDW 14.4  LYMPHSABS 0.9  MONOABS 0.6  EOSABS 0.0  BASOSABS 0.0   ------------------------------------------------------------------------------------------------------------------  Chemistries   Recent Labs Lab 09/05/16 1954  NA 133*  K 3.1*  CL 91*  CO2 30  GLUCOSE 97  BUN 82*  CREATININE 2.73*  CALCIUM 8.2*  AST 26  ALT 16*  ALKPHOS 96  BILITOT 1.2   ------------------------------------------------------------------------------------------------------------------  ------------------------------------------------------------------------------------------------------------------ GFR: Estimated Creatinine Clearance: 29 mL/min (by C-G formula based on SCr of 2.73 mg/dL (H)). Liver Function Tests:  Recent Labs Lab 09/05/16 1954  AST 26  ALT 16*  ALKPHOS 96  BILITOT 1.2  PROT 7.7  ALBUMIN 2.8*   No results for input(s): LIPASE, AMYLASE in the last 168 hours.  Recent Labs Lab 09/05/16 2016  AMMONIA 20    ------------------------------------------------------------------------------------------------    Imaging Results:      Assessment & Plan:    Active Problems:   Poorly controlled type 2 diabetes mellitus (HCC)   Diabetic nephropathy (HCC)   Chronic kidney disease, stage IV (severe) (HCC)   Acute on chronic systolic heart  failure, NYHA class 3 (HCC)   Anasarca   1. Acute on chronic systolic CHF- placed under observation, start IV  aggressive diuresis with Lasix. We'll start Lasix 40 mg IV every 12 hours from tomorrow morning. Follow BMP in a.m. follow intake and output. 2. Anasarca- patient has albumin of 2.8, with generalized edema. We'll start protein supplement 1 can by mouth 3 times a day. We'll obtain nutrition consult in a.m. 3. Hypokalemia- potassium is 3.1, will replace potassium and check BMP in a.m. 4. Diabetes mellitus- continue Levemir, start sliding scale insulin with NovoLog. 5. Chronic leg ulcers- we'll obtain wound care consultation in a.m. 6. CAD- stable, continue metoprolol. 7. CKD stage IV patient's creatinine is at baseline, follow BMP in a.m. Patient started on IV Lasix.   DVT Prophylaxis-   Lovenox   AM Labs Ordered, also please review Full Orders  Family Communication: Admission, patients condition and plan of care including tests being ordered have been discussed with the patient and his wife at bedside who indicate understanding and agree with the plan and Code Status.  Code Status:  Full code  Admission status: Observation    Time spent in minutes : 60 minutes   LAMA,GAGAN S M.D on 09/05/2016 at 10:34 PM  Between 7am to 7pm - Pager - [phone]. After 7pm go to [email or web address] - password [facility]  [facility] - Office  [phone]

## 2016-09-05 NOTE — ED Triage Notes (Signed)
Bilateral leg ulcer, pain, swelling

## 2016-09-05 NOTE — ED Provider Notes (Signed)
[place] DEPT Provider Note   CSN: [id number] Arrival date & time: 09/05/16  1724     History   Chief Complaint Chief Complaint  Patient presents with  . leg ulcers    HPI Chris [name] is a 75 y.o. male.  HPI  The patient is a 75 year old male, history of congestive heart failure with an ischemic cardiomyopathy and ejection fraction of 35-40%, he also has a history of hypertension, chronic leg pain with open wounds to his bilateral legs which she has had for months and months. He is well-known to not take his medication at night because he does not want to be up at night urinating, according to the medical record the patient has had multiple evaluations by the cardiology service and home health and they've encouraged him to use his medications as prescribed but he does not like that. He has also had a history of chronic renal insufficiency and is stage IV according to the wife. They report that his legs continue to weep and sometimes are clear or yellow drainage, she is addressing his legs 3 times a day to help contain the fluid losses. They also report that his abdomen has become quite swollen. The patient refuses to give much in the way of information but states that he continues to swell and is very frustrated with it. There is no fever, no coughing, no headache.  Past Medical History:  Diagnosis Date  . Acute cerebrovascular accident [facility])    Presumed embolic from his atrial fibrillation with hemorrhagic conbersion  . Atrial fibrillation (HCC)    Not anticoagulated - chronic epistaxis as well as hemorrhagic conversion of prior stroke  . CAD (coronary artery disease)    Multivessel status post CABG  . Chronic leg pain   . Chronic systolic heart failure ([place])   . Confusion   . Essential hypertension   . History of medication noncompliance    "due to confusion"  . Hyperlipidemia   . Ischemic cardiomyopathy    LVEF 35-40%  . Macrocytosis    Normal B12 & folate levels    . Myocardial infarct   . Pedal edema   . Pleural effusion 10/05/2013  . Tubular adenoma   . Type 2 diabetes mellitus [facility])     Patient Active Problem List   Diagnosis Date Noted  . Acute on chronic systolic heart failure, NYHA class 3 ([place])   . CAD (coronary artery disease), native coronary artery   . Acute on chronic diastolic CHF (congestive heart failure) ([place]) 07/14/2016  . Hyperkalemia 07/13/2016  . Hyponatremia 07/13/2016  . Adjustment reaction 07/13/2016  . Stasis dermatitis of both legs 07/13/2016  . Healthcare-associated pneumonia 03/28/2016  . Acute renal failure superimposed on stage 4 chronic kidney disease ([place]) 03/28/2016  . History of colonic polyps 12/27/2015  . Chronic kidney disease, stage IV (severe) ([place]) 11/26/2015  . Anemia in chronic kidney disease 11/26/2015  . Dyspepsia 12/01/2013  . Recurrent right pleural effusion 10/31/2013  . Diabetic nephropathy ([place]) 08/06/2013  . Acute on chronic heart failure of undertermine significance 08/06/2013  . CAP (community acquired pneumonia) 07/27/2013  . Chronic atrial fibrillation ([place]) 07/23/2013  . CVA (cerebral infarction) 07/23/2013  . Essential hypertension 07/23/2013  . Poorly controlled type 2 diabetes mellitus ([place]) 07/23/2013  . GERD (gastroesophageal reflux disease) 10/18/2012  . Abdominal pain 10/18/2012  . Constipation 10/18/2012    Past Surgical History:  Procedure Laterality Date  . CHEST TUBE INSERTION Right 10/31/2013   Procedure: INSERTION PLEURAL  DRAINAGE CATHETER;  Surgeon: Melrose Nakayama, MD;  Location: [place];  Service: Thoracic;  Laterality: Right;  . CHOLECYSTECTOMY  2008  . COLONOSCOPY N/A 12/23/2012   Dr. Gala Romney: multiple rectal and colonic polyps removed, tubular adenomas. needs surveillance April 2017.   Marland Kitchen CORONARY ARTERY BYPASS GRAFT    . ESOPHAGOGASTRODUODENOSCOPY (EGD) WITH ESOPHAGEAL DILATION N/A 10/27/2012   Dr. Gala Romney: distal esophageal diverticulum, non-complete Schatzki's ring  s/p dilation, distal esophageal nodule with benign path, negative Barrett's  . PLEURAL EFFUSION DRAINAGE Right 10/31/2013   Procedure: DRAINAGE OF PLEURAL EFFUSION;  Surgeon: Melrose Nakayama, MD;  Location: [place];  Service: Thoracic;  Laterality: Right;  . TONSILLECTOMY    . VIDEO ASSISTED THORACOSCOPY Right 10/31/2013   Procedure: VIDEO ASSISTED THORACOSCOPY;  Surgeon: Melrose Nakayama, MD;  Location: [place];  Service: Thoracic;  Laterality: Right;       Home Medications    Prior to Admission medications   Medication Sig Start Date End Date Taking? Authorizing Provider  alum & mag hydroxide-simeth (MAALOX/MYLANTA) 200-200-20 MG/5ML suspension Take 30 mLs by mouth daily as needed for indigestion or heartburn.     Historical Provider, MD  aspirin EC 81 MG tablet Take 81 mg by mouth daily.    Historical Provider, MD  atorvastatin (LIPITOR) 40 MG tablet Take 40 mg by mouth daily at 6 PM.  01/07/14   Historical Provider, MD  Cholecalciferol (VITAMIN D-3) 5000 UNITS TABS Take 5,000 Units by mouth daily.     Historical Provider, MD  insulin detemir (LEVEMIR) 100 UNIT/ML injection Inject 40-50 Units into the skin every morning.     Historical Provider, MD  LORazepam (ATIVAN) 0.5 MG tablet Take 0.5 mg by mouth 2 (two) times daily as needed for anxiety.    Historical Provider, MD  metoprolol succinate (TOPROL-XL) 50 MG 24 hr tablet Take 1 tablet (50 mg total) by mouth 2 (two) times daily. Take with or immediately following a meal. 07/17/16   Samuella Cota, MD  Multiple Vitamin (MULTIVITAMIN WITH MINERALS) TABS tablet Take 1 tablet by mouth daily.    Historical Provider, MD  nitroGLYCERIN (NITROSTAT) 0.4 MG SL tablet Place 1 tablet (0.4 mg total) under the tongue every 5 (five) minutes as needed for chest pain. 04/10/16   Herminio Commons, MD  ondansetron (ZOFRAN) 4 MG tablet Take 4 mg by mouth every 8 (eight) hours as needed for nausea or vomiting.    Historical Provider, MD  potassium  chloride SA (K-DUR,KLOR-CON) 20 MEQ tablet Take 1 tablet (20 mEq total) by mouth daily. 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    Historical Provider, MD  ranitidine (ZANTAC) 150 MG tablet Take 150 mg by mouth 2 (two) times daily. Reported on 03/07/2016    Historical Provider, MD  torsemide (DEMADEX) 20 MG tablet Take 2 tablets (40 mg total) by mouth 2 (two) times daily. 07/17/16   Samuella Cota, MD  triamcinolone cream (KENALOG) 0.1 % APPLY TO AFFECTED AREA DAILY AS DIRECTED Patient taking differently: Apply 1 application topically daily as needed (for rash).  03/29/16   Orvan Falconer, MD    Family History Family History  Problem Relation Age of Onset  . Colon polyps Sister   . Colon polyps Sister   . Colon cancer Neg Hx     Social History Social History  Substance Use Topics  . Smoking status: Never Smoker  . Smokeless tobacco: Never Used  . Alcohol use No     Allergies   Ciprofloxacin; Finasteride; Lisinopril; Metoclopramide; Protonix [pantoprazole sodium]; and Xanax [alprazolam]   Review of Systems Review of Systems  All other systems reviewed and are negative.    Physical Exam Updated Vital Signs BP 141/77 (BP Location: Left Arm)   Pulse 91   Temp 98.4 F (36.9 C) (Temporal)   Resp 16   Ht 6' 2.5" (1.892 m)   Wt 223 lb (101.2 kg)   SpO2 96%   BMI 28.25 kg/m   Physical Exam  Constitutional: He appears well-developed and well-nourished. No distress.  HENT:  Head: Normocephalic and atraumatic.  Mouth/Throat: Oropharynx is clear and moist. No oropharyngeal exudate.  Eyes: Conjunctivae and EOM are normal. Pupils are equal, round, and reactive to light. Right eye exhibits no discharge. Left eye exhibits no discharge. No scleral icterus.  Neck: Normal range of motion. Neck supple. No JVD present. No thyromegaly present.    Cardiovascular: Normal rate, regular rhythm, normal heart sounds and intact distal pulses.  Exam reveals no gallop and no friction rub.   No murmur heard. Pulmonary/Chest: Effort normal and breath sounds normal. No respiratory distress. He has no wheezes. He has no rales.  Abdominal: Soft. Bowel sounds are normal. He exhibits distension. He exhibits no mass. There is no tenderness.  Musculoskeletal: Normal range of motion. He exhibits edema. He exhibits no tenderness.  Severe bilateral LE edema from the toes through the nipples proximally.  This is symmetrical  Lymphadenopathy:    He has no cervical adenopathy.  Neurological: He is alert. Coordination normal.  The patient follows commands without difficulty  Skin: Skin is warm. Rash noted. No erythema.  Multiple open wounds of the lower extremities bilaterally draining clear effluent, no surrounding redness or tenderness  Psychiatric: He has a normal mood and affect. His behavior is normal.  Nursing note and vitals reviewed.    ED Treatments / Results  Labs (all labs ordered are listed, but only abnormal results are displayed) Labs Reviewed  CULTURE, BLOOD (ROUTINE X 2)  CBC  BASIC METABOLIC PANEL  CBC WITH DIFFERENTIAL/PLATELET  COMPREHENSIVE METABOLIC PANEL  BRAIN NATRIURETIC PEPTIDE  AMMONIA     Radiology No results found.  Procedures Procedures (including critical care time)  Medications Ordered in ED Medications  furosemide (LASIX) injection 80 mg (not administered)     Initial Impression / Assessment and Plan / ED Course  I have reviewed the triage vital signs and the nursing notes.  Pertinent labs & imaging results that were available during my care of the patient were reviewed by me and considered in my medical decision making (see chart for details).  Clinical Course    The patient is clearly in a fluid overloaded state I suspect this is related to congestive heart failure with a noncompliance with his  diuretic however he could just be decompensated as well. He does not have any abnormal lung sounds or significant hypoxia, lungs are clear, heart is regular, definitely needs diuresis and inpatient management for his anasarca.  Labs show predictably low Albumin, mild anemia and stably elevated Cr over time.    Lasix given  D/W Dr. Darrick Meigs for Admission  Final Clinical Impressions(s) / ED Diagnoses   Final diagnoses:  Anasarca    New Prescriptions New Prescriptions   No medications on file     Noemi Chapel, MD 09/05/16 2159

## 2016-09-06 DIAGNOSIS — L97929 Non-pressure chronic ulcer of unspecified part of left lower leg with unspecified severity: Secondary | ICD-10-CM | POA: Diagnosis present

## 2016-09-06 DIAGNOSIS — I255 Ischemic cardiomyopathy: Secondary | ICD-10-CM | POA: Diagnosis present

## 2016-09-06 DIAGNOSIS — L97919 Non-pressure chronic ulcer of unspecified part of right lower leg with unspecified severity: Secondary | ICD-10-CM | POA: Diagnosis present

## 2016-09-06 DIAGNOSIS — I2510 Atherosclerotic heart disease of native coronary artery without angina pectoris: Secondary | ICD-10-CM | POA: Diagnosis present

## 2016-09-06 DIAGNOSIS — I5023 Acute on chronic systolic (congestive) heart failure: Secondary | ICD-10-CM | POA: Diagnosis not present

## 2016-09-06 DIAGNOSIS — Z9049 Acquired absence of other specified parts of digestive tract: Secondary | ICD-10-CM | POA: Diagnosis not present

## 2016-09-06 DIAGNOSIS — N179 Acute kidney failure, unspecified: Secondary | ICD-10-CM | POA: Diagnosis not present

## 2016-09-06 DIAGNOSIS — K219 Gastro-esophageal reflux disease without esophagitis: Secondary | ICD-10-CM | POA: Diagnosis present

## 2016-09-06 DIAGNOSIS — I130 Hypertensive heart and chronic kidney disease with heart failure and stage 1 through stage 4 chronic kidney disease, or unspecified chronic kidney disease: Secondary | ICD-10-CM | POA: Diagnosis present

## 2016-09-06 DIAGNOSIS — E11622 Type 2 diabetes mellitus with other skin ulcer: Secondary | ICD-10-CM | POA: Diagnosis present

## 2016-09-06 DIAGNOSIS — N184 Chronic kidney disease, stage 4 (severe): Secondary | ICD-10-CM | POA: Diagnosis present

## 2016-09-06 DIAGNOSIS — I252 Old myocardial infarction: Secondary | ICD-10-CM | POA: Diagnosis not present

## 2016-09-06 DIAGNOSIS — I482 Chronic atrial fibrillation: Secondary | ICD-10-CM | POA: Diagnosis present

## 2016-09-06 DIAGNOSIS — E1122 Type 2 diabetes mellitus with diabetic chronic kidney disease: Secondary | ICD-10-CM | POA: Diagnosis present

## 2016-09-06 DIAGNOSIS — I509 Heart failure, unspecified: Secondary | ICD-10-CM | POA: Diagnosis not present

## 2016-09-06 DIAGNOSIS — E8889 Other specified metabolic disorders: Secondary | ICD-10-CM | POA: Diagnosis present

## 2016-09-06 DIAGNOSIS — Z951 Presence of aortocoronary bypass graft: Secondary | ICD-10-CM | POA: Diagnosis not present

## 2016-09-06 DIAGNOSIS — E1165 Type 2 diabetes mellitus with hyperglycemia: Secondary | ICD-10-CM | POA: Diagnosis present

## 2016-09-06 DIAGNOSIS — D631 Anemia in chronic kidney disease: Secondary | ICD-10-CM | POA: Diagnosis present

## 2016-09-06 DIAGNOSIS — E1121 Type 2 diabetes mellitus with diabetic nephropathy: Secondary | ICD-10-CM | POA: Diagnosis present

## 2016-09-06 DIAGNOSIS — E785 Hyperlipidemia, unspecified: Secondary | ICD-10-CM | POA: Diagnosis present

## 2016-09-06 DIAGNOSIS — I5043 Acute on chronic combined systolic (congestive) and diastolic (congestive) heart failure: Secondary | ICD-10-CM | POA: Diagnosis present

## 2016-09-06 DIAGNOSIS — E876 Hypokalemia: Secondary | ICD-10-CM | POA: Diagnosis present

## 2016-09-06 DIAGNOSIS — Z8673 Personal history of transient ischemic attack (TIA), and cerebral infarction without residual deficits: Secondary | ICD-10-CM | POA: Diagnosis not present

## 2016-09-06 DIAGNOSIS — R601 Generalized edema: Secondary | ICD-10-CM | POA: Diagnosis not present

## 2016-09-06 DIAGNOSIS — Z9114 Patient's other noncompliance with medication regimen: Secondary | ICD-10-CM | POA: Diagnosis not present

## 2016-09-06 DIAGNOSIS — Z7982 Long term (current) use of aspirin: Secondary | ICD-10-CM | POA: Diagnosis not present

## 2016-09-06 LAB — BASIC METABOLIC PANEL
ANION GAP: 11 (ref 5–15)
BUN: 82 mg/dL — ABNORMAL HIGH (ref 6–20)
CO2: 27 mmol/L (ref 22–32)
Calcium: 8.1 mg/dL — ABNORMAL LOW (ref 8.9–10.3)
Chloride: 95 mmol/L — ABNORMAL LOW (ref 101–111)
Creatinine, Ser: 2.8 mg/dL — ABNORMAL HIGH (ref 0.61–1.24)
GFR calc Af Amer: 24 mL/min — ABNORMAL LOW (ref >60)
GFR calc non Af Amer: 20 mL/min — ABNORMAL LOW (ref >60)
Glucose, Bld: 149 mg/dL — ABNORMAL HIGH (ref 65–99)
POTASSIUM: 3.6 mmol/L (ref 3.5–5.1)
Sodium: 133 mmol/L — ABNORMAL LOW (ref 135–145)

## 2016-09-06 LAB — GLUCOSE, CAPILLARY: Glucose-Capillary: 252 mg/dL — ABNORMAL HIGH (ref 65–99)

## 2016-09-06 MED ORDER — INSULIN DETEMIR 100 UNIT/ML ~~LOC~~ SOLN
38.0000 [IU] | Freq: Every day | SUBCUTANEOUS | Status: DC
  Filled 2016-09-06 (×2): qty 0.38

## 2016-09-06 MED ORDER — METOPROLOL SUCCINATE ER 50 MG PO TB24
50.0000 mg | ORAL_TABLET | Freq: Two times a day (BID) | ORAL | Status: DC
  Administered 2016-09-06 – 2016-09-09 (×8): 50 mg via ORAL
  Filled 2016-09-06 (×8): qty 1

## 2016-09-06 MED ORDER — SODIUM CHLORIDE 0.9% FLUSH: 3.0000 mL | INTRAVENOUS | Status: DC | PRN

## 2016-09-06 MED ORDER — ENSURE ENLIVE PO LIQD
237.0000 mL | Freq: Two times a day (BID) | ORAL | Status: DC
  Administered 2016-09-07 – 2016-09-09 (×3): 237 mL via ORAL

## 2016-09-06 MED ORDER — ONDANSETRON HCL 4 MG/2ML IJ SOLN
4.0000 mg | Freq: Three times a day (TID) | INTRAMUSCULAR | Status: AC | PRN
Start: 2016-09-06 — End: 2016-09-06

## 2016-09-06 MED ORDER — LORAZEPAM 0.5 MG PO TABS: 0.5000 mg | ORAL_TABLET | Freq: Two times a day (BID) | ORAL | Status: DC | PRN

## 2016-09-06 MED ORDER — SODIUM CHLORIDE 0.9 % IV SOLN
250.0000 mL | INTRAVENOUS | Status: DC | PRN
  Administered 2016-09-06: 250 mL via INTRAVENOUS

## 2016-09-06 MED ORDER — ENOXAPARIN SODIUM 30 MG/0.3ML ~~LOC~~ SOLN
30.0000 mg | SUBCUTANEOUS | Status: DC
  Administered 2016-09-07 – 2016-09-09 (×3): 30 mg via SUBCUTANEOUS
  Filled 2016-09-06 (×3): qty 0.3

## 2016-09-06 MED ORDER — INSULIN DETEMIR 100 UNIT/ML ~~LOC~~ SOLN
38.0000 [IU] | Freq: Once | SUBCUTANEOUS | Status: AC
  Administered 2016-09-06: 38 [IU] via SUBCUTANEOUS
  Filled 2016-09-06: qty 0.38

## 2016-09-06 MED ORDER — INSULIN DETEMIR 100 UNIT/ML ~~LOC~~ SOLN
30.0000 [IU] | SUBCUTANEOUS | Status: DC
  Filled 2016-09-06 (×3): qty 0.3

## 2016-09-06 MED ORDER — ACETAMINOPHEN 325 MG PO TABS: 650.0000 mg | ORAL_TABLET | ORAL | Status: DC | PRN

## 2016-09-06 MED ORDER — POTASSIUM CHLORIDE 10 MEQ/100ML IV SOLN
10.0000 meq | INTRAVENOUS | Status: AC
  Administered 2016-09-06 (×3): 10 meq via INTRAVENOUS
  Filled 2016-09-06 (×3): qty 100

## 2016-09-06 MED ORDER — PRO-STAT SUGAR FREE PO LIQD
30.0000 mL | Freq: Three times a day (TID) | ORAL | Status: DC
  Administered 2016-09-06 – 2016-09-09 (×6): 30 mL via ORAL
  Filled 2016-09-06 (×7): qty 30

## 2016-09-06 MED ORDER — INSULIN DETEMIR 100 UNIT/ML ~~LOC~~ SOLN
38.0000 [IU] | Freq: Every morning | SUBCUTANEOUS | Status: DC
  Filled 2016-09-06 (×2): qty 0.38

## 2016-09-06 MED ORDER — BOOST / RESOURCE BREEZE PO LIQD
1.0000 | Freq: Three times a day (TID) | ORAL | Status: DC
  Administered 2016-09-06: 1 via ORAL

## 2016-09-06 MED ORDER — ONDANSETRON HCL 4 MG/2ML IJ SOLN
4.0000 mg | Freq: Four times a day (QID) | INTRAMUSCULAR | Status: DC | PRN
  Administered 2016-09-07: 4 mg via INTRAVENOUS
  Filled 2016-09-06: qty 2

## 2016-09-06 MED ORDER — FUROSEMIDE 10 MG/ML IJ SOLN
40.0000 mg | Freq: Two times a day (BID) | INTRAMUSCULAR | Status: DC
Start: 2016-09-06 — End: 2016-09-07
  Administered 2016-09-06 – 2016-09-07 (×3): 40 mg via INTRAVENOUS
  Filled 2016-09-06 (×3): qty 4

## 2016-09-06 MED ORDER — ATORVASTATIN CALCIUM 40 MG PO TABS
40.0000 mg | ORAL_TABLET | Freq: Every day | ORAL | Status: DC
  Administered 2016-09-06 – 2016-09-08 (×3): 40 mg via ORAL
  Filled 2016-09-06 (×4): qty 1

## 2016-09-06 MED ORDER — SODIUM CHLORIDE 0.9% FLUSH
3.0000 mL | Freq: Two times a day (BID) | INTRAVENOUS | Status: DC
  Administered 2016-09-06 – 2016-09-09 (×8): 3 mL via INTRAVENOUS

## 2016-09-06 MED ORDER — ENOXAPARIN SODIUM 40 MG/0.4ML ~~LOC~~ SOLN
40.0000 mg | SUBCUTANEOUS | Status: DC
  Administered 2016-09-06: 40 mg via SUBCUTANEOUS
  Filled 2016-09-06: qty 0.4

## 2016-09-06 NOTE — Consult Note (Signed)
[place] Nurse wound consult note Reason for Consult: Multiple full thickness wounds on bilateral LEs, chronic, non-healing.  Nine (9) full thickness wounds on right LE, 7 full thickness wounds and two partial thickness wounds on Left LE. He is known to our department from previous admission 2 months ago. Patient is no longer followed by outpatient wound care center in [place] as they told them they "had nothing else to offer him for wound healing".  Seen by [facility] 1 x week.  [facility] provides wound care supplies (Kerlix, ABD pads). Patient refused Unna's Boots in the past.  These ulcers are not venous in origin; suspect arterial component, mixed etiology or vasculitic ulcerations as they present with a "punched out" appearance, area scattered over the entire LE (not in the medial or lateral malleolar areas)  and exhibit closed wound edges (epibole). Patient and wife were prepared to sign out AMA earlier today.  Are amenable to assessment and recommendations from this writer, "as long as they include ACE" bandages.  Patient's wife states that patient is either in bed or in a chair (not a recliner) at home and that when he is up in the chair, she "tries to get him to use a foot stool". This is insufficient for elevation. Wound type: Vasculitic ulcers, mixed etiology Pressure Ulcer POA: No Measurement: Sixteen full thickness wounds on the bilateral LEs, plus 2 partial thickness wounds on the left LE.  The largest on the right measures 4cm x 2.5cm x 0.4cm on the anterior, pretibial area.The most problematic on the left is the one over the Achille's tendon.  The left LE is with two recently ruptures blisters, one on the great toe and one on the medial LE. Wound bed: pale red, smooth (non-granulating). Drainage (amount, consistency, odor) Moderate, serous Periwound: Erythematous, edematous (improved since admission), evidence of previous wound healing (scarring). Blister on left great toe and left medial LE (ruptured)  With  macerated epidermis pushed to one side.  I have attempted to roll ut and use to recover dermis, but it is firmly adherent. Dressing procedure/placement/frequency: I believe this patient (and perhaps his wife) require a higher level of care than can be achieved at home.  While they are unlikely to agree to this, I share that patient is in need of Nutritional Consult, PT evaluation and systemic antibiotics prior to returning home independently.  Wife appears to agree, patient does not respond. I will implement conservative wound care using xeroform gauze in a single layer to cover wounds and blisters and ask Nursing to change daily beginning today.  Xeroform is selected for both the antimicrobial property as well as the bismuth's astringent property.These dressings will be covered with ABD pads, then wrapped/secured with Kerlix roll gauze wrapped from toes to knees.  The Kerlix will be covered with ACE wraps.  We will attempt to place patient's LEs in Prevalon boots while he is in bed to reduce pressure on the left Achille's tendon ulcer. [place] nursing team will not follow, but will remain available to this patient, the nursing and medical teams.  Please re-consult if needed. Thanks, Maudie Flakes, MSN, RN, [facility], Arther Abbott  Pager# [phone]

## 2016-09-06 NOTE — Progress Notes (Signed)
Pt admitted to [place] via stretcher from ED.  Pt accompanied by wife.  Admission assessment completed.  Pt oriented to room environment, call bell given.  Pt wife to spend the night.

## 2016-09-06 NOTE — Progress Notes (Signed)
Nursing received call from central tele that patient's tele was off. On arrival to room, tele monitor was lying at the bedside. Wife was assisting patient to get dressed and he stated "I'm going home and that's it." notified Dr. Jerilee Hoh. Order received for levemir 38 units x 1. Discussed with pt. Wife expressed concerns about caring for him at home. Pt very weak on standing with max assist. Discussed with patient risks of leaving AMA, wife and pt stated would stay to speak with MD "now" per patient. Notified Dr. Jerilee Hoh. Caryl Pina Exa Bomba,RN

## 2016-09-06 NOTE — Progress Notes (Signed)
PROGRESS NOTE    Chris [name]  [id number] DOB: [birth year] DOA: 09/05/2016 PCP: Gar Ponto, MD     Brief Narrative:  75 y/o man admitted from home on 12/15 due to anasarca. Believed to have acute CHF and admission requested.   Assessment & Plan:   Active Problems:   Poorly controlled type 2 diabetes mellitus (HCC)   Diabetic nephropathy (HCC)   Chronic kidney disease, stage IV (severe) (HCC)   Acute on chronic systolic heart failure, NYHA class 3 (HCC)   Anasarca   Acute on Chronic Combined CHF -ECHO 10/17 with EF 35-40% and LVH. -Low albumin of 2.8 also contributing to edema. -Continue IV lasix, I do not believe Is and Os are adequately documented.  Stage IV CKD -Cr is around baseline of 2-6-2.8. -Given edema and advanced CKD, will request renal consultation.  CAD -Stable, no CP.  Hypokalemia -Replaced  Bilateral LE wounds -Appreciate wound care recommendations.   DVT prophylaxis: lovenox Code Status: full code Family Communication: wife at bedside Disposition Plan: pending medical evolution and PT recommendations  Consultants:   None  Procedures:   None  Antimicrobials:  Anti-infectives    None       Subjective: Feels anxious, feels like nothing has been done for him, wanted to leave AMA earlier today  Objective: Vitals:   09/05/16 2300 09/06/16 0007 09/06/16 0652 09/06/16 1317  BP: (!) 124/105 128/64 (!) 100/46 127/67  Pulse: 96 (!) 102 94 (!) 105  Resp: 25 18 18 16   Temp:  97.7 F (36.5 C) 98 F (36.7 C) 98.1 F (36.7 C)  TempSrc:  Oral Oral Oral  SpO2: 100% 98% 93% 95%  Weight:  101.9 kg (224 lb 9.6 oz) 102.1 kg (225 lb)   Height:        Intake/Output Summary (Last 24 hours) at 09/06/16 1705 Last data filed at 09/06/16 1300  Gross per 24 hour  Intake           607.17 ml  Output              850 ml  Net          -242.83 ml   Filed Weights   09/05/16 1813 09/06/16 0007 09/06/16 0652  Weight: 101.2 kg (223 lb) 101.9 kg  (224 lb 9.6 oz) 102.1 kg (225 lb)    Examination:  General exam: Alert, awake, oriented x 3 Respiratory system: Clear to auscultation. Respiratory effort normal. Cardiovascular system:RRR. No murmurs, rubs, gallops. Gastrointestinal system: distended Central nervous system: Alert and oriented. No focal neurological deficits. Extremities: 2-3+ edema, covered in clean dressing Skin: No rashes, lesions or ulcers Psychiatry: mood is anxious    Data Reviewed: I have personally reviewed following labs and imaging studies  CBC:  Recent Labs Lab 09/05/16 1954  WBC 8.1  NEUTROABS 6.6  HGB 11.1*  HCT 34.1*  MCV 105.2*  PLT [id number]   Basic Metabolic Panel:  Recent Labs Lab 09/05/16 1954 09/06/16 0549  NA 133* 133*  K 3.1* 3.6  CL 91* 95*  CO2 30 27  GLUCOSE 97 149*  BUN 82* 82*  CREATININE 2.73* 2.80*  CALCIUM 8.2* 8.1*   GFR: Estimated Creatinine Clearance: 28.4 mL/min (by C-G formula based on SCr of 2.8 mg/dL (H)). Liver Function Tests:  Recent Labs Lab 09/05/16 1954  AST 26  ALT 16*  ALKPHOS 96  BILITOT 1.2  PROT 7.7  ALBUMIN 2.8*   No results for input(s): LIPASE, AMYLASE in the last 168 hours.  Recent Labs Lab 09/05/16 2016  AMMONIA 20   Coagulation Profile: No results for input(s): INR, PROTIME in the last 168 hours. Cardiac Enzymes: No results for input(s): CKTOTAL, CKMB, CKMBINDEX, TROPONINI in the last 168 hours. BNP (last 3 results) No results for input(s): PROBNP in the last 8760 hours. HbA1C: No results for input(s): HGBA1C in the last 72 hours. CBG:  Recent Labs Lab 09/06/16 1150  GLUCAP 252*   Lipid Profile: No results for input(s): CHOL, HDL, LDLCALC, TRIG, CHOLHDL, LDLDIRECT in the last 72 hours. Thyroid Function Tests: No results for input(s): TSH, T4TOTAL, FREET4, T3FREE, THYROIDAB in the last 72 hours. Anemia Panel: No results for input(s): VITAMINB12, FOLATE, FERRITIN, TIBC, IRON, RETICCTPCT in the last 72 hours. Urine  analysis:    Component Value Date/Time   COLORURINE YELLOW 07/13/2016 [place] 07/13/2016 1355   LABSPEC <1.005 (L) 07/13/2016 1355   PHURINE 6.5 07/13/2016 1355   GLUCOSEU >1000 (A) 07/13/2016 1355   HGBUR NEGATIVE 07/13/2016 1355   BILIRUBINUR NEGATIVE 07/13/2016 1355   KETONESUR NEGATIVE 07/13/2016 1355   PROTEINUR NEGATIVE 07/13/2016 1355   UROBILINOGEN 0.2 10/27/2013 1136   NITRITE NEGATIVE 07/13/2016 1355   LEUKOCYTESUR NEGATIVE 07/13/2016 1355   Sepsis Labs: @LABRCNTIP (procalcitonin:4,lacticidven:4)  ) Recent Results (from the past 240 hour(s))  Culture, blood (routine x 2)     Status: None (Preliminary result)   Collection Time: 09/05/16  7:53 PM  Result Value Ref Range Status   Specimen Description RIGHT ANTECUBITAL  Final   Special Requests BOTTLES DRAWN AEROBIC AND ANAEROBIC 6CC EACH  Final   Culture NO GROWTH < 24 HOURS  Final   Report Status PENDING  Incomplete         Radiology Studies: No results found.      Scheduled Meds: . atorvastatin  40 mg Oral q1800  . [START ON 09/07/2016] enoxaparin (LOVENOX) injection  30 mg Subcutaneous Q24H  . [START ON 09/07/2016] feeding supplement (ENSURE ENLIVE)  237 mL Oral BID BM  . feeding supplement (PRO-STAT SUGAR FREE 64)  30 mL Oral TID  . furosemide  40 mg Intravenous Q12H  . insulin detemir  30 Units Subcutaneous BH-q7a  . metoprolol succinate  50 mg Oral BID  . sodium chloride flush  3 mL Intravenous Q12H   Continuous Infusions:   LOS: 0 days    Time spent: 25 minutes. Greater than 50% of this time was spent in direct contact with the patient coordinating care.     Lelon Frohlich, MD [facility] Pager [phone]  If 7PM-7AM, please contact night-coverage [email or web address] Password [email or web address] 09/06/2016, 5:05 PM

## 2016-09-06 NOTE — Progress Notes (Signed)
Patient had refused telemetry to be placed back on. Notified Dr. Jerilee Hoh. Telemetry monitor reapplied at 1340 when patient agreeable. Donavan Foil, RN

## 2016-09-06 NOTE — Progress Notes (Signed)
Initial Nutrition Assessment  DOCUMENTATION CODES:  Not applicable  INTERVENTION:  Will order 30 mL Prostat TID, each supplement provides 100 kcal and 15 grams of protein.  Ensure Enlive po BID, each supplement provides 350 kcal and 20 grams of protein  Recommend MVI w/ min + Additional Zinc, Vitamin C supplementation.   Can follow up with diet on appropriate diet if Admitted monday  NUTRITION DIAGNOSIS:  Increased nutrient needs related to wound healing as evidenced by estimated nutritional requirements for this condition  GOAL:  Patient will meet greater than or equal to 90% of their needs  MONITOR:  PO intake, Labs, Weight trends, Skin, I & O's  REASON FOR ASSESSMENT:  Consult Assessment of nutrition requirement/status  ASSESSMENT:  75 y/o male PMHx HF, Afib, HTN, CVA, CKDIV, DM2, CAD, Chronic Leg wounds to BLE. Presented with worsening swelling. Admitted for HF exacerbation and anasarca.    RD operating remotely. Per chart review, pt's wt is relatively stable. There is no mention of poor PO intake recently. He presented to the ED specifically for his wounds.   1 Documented meal intake. Ate 75% of a meal.   He is not on any type of protein/vitamin supplement. Would recommend these, although per [place] note, he has been advised by past wound care clinic that they can not do any more for him in regards to wound healing. Likely has been supplemented in past.    Ensure ENlive BID and Prostat 30 ml TID to meet excessive protein needs. He would benefit from appropriate diet ed, not just on wound healing, but also for management of CHF. Can touch base on MOnday, however, given past behavior, this sounds highly unlikely.   Per chart review, his wt fluctuates between 200-220 lbs. He doesn't appear to be always compliant with lasix which is likely related to weight variance.   Diet Order:  Diet heart healthy/carb modified Room service appropriate? Yes; Fluid consistency: Thin  Skin:  Per WOC note, pt has 9 full thickness wounds to Right LE and 7 Full thickness wounds + 2 partial thickness wounds to Left LE  Labs: Albumin: 2.8, BG 150-250 mg/dl Medications: Lasix, Insulin, Resource Breeze   Recent Labs Lab 09/05/16 1954 09/06/16 0549  NA 133* 133*  K 3.1* 3.6  CL 91* 95*  CO2 30 27  BUN 82* 82*  CREATININE 2.73* 2.80*  CALCIUM 8.2* 8.1*  GLUCOSE 97 149*   Last BM:  12/15  Height:  Ht Readings from Last 1 Encounters:  09/05/16 6' 2.5" (1.892 m)   Weight:  Wt Readings from Last 1 Encounters:  09/06/16 225 lb (102.1 kg)   Wt Readings from Last 10 Encounters:  09/06/16 225 lb (102.1 kg)  07/24/16 210 lb (95.3 kg)  07/21/16 210 lb (95.3 kg)  07/17/16 210 lb (95.3 kg)  06/24/16 205 lb (93 kg)  05/23/16 201 lb (91.2 kg)  04/24/16 207 lb (93.9 kg)  04/10/16 214 lb (97.1 kg)  03/28/16 203 lb 1 oz (92.1 kg)  03/27/16 199 lb (90.3 kg)   Ideal Body Weight:  87.73 kg  BMI:  Body mass index is 28.5 kg/m.  Estimated Nutritional Needs:  Kcal:  2200-2400 kcals Protein:  120-140 g Fluid:  Per MD  EDUCATION NEEDS:  No education needs identified at this time  Burtis Junes RD, LDN, [facility] Pager: [phone] 09/06/2016 4:55 PM

## 2016-09-06 NOTE — Progress Notes (Signed)
Late entry: Patient requested to have Levemir 38 units this morning about 1130, stated that is what he would take at home. Discussed with patient that he had levemir 30 units ordered here for each morning and nursing had notfiied pharmacy to send the dose. Pt and wife expressed some concerns with his care and having to wait for levemir and to see physician. Notified Dr. Jerilee Hoh. Stated she would see him and address levemir dose. Pt notified. Donavan Foil, RN

## 2016-09-07 LAB — GLUCOSE, CAPILLARY
GLUCOSE-CAPILLARY: 219 mg/dL — AB (ref 65–99)
Glucose-Capillary: 238 mg/dL — ABNORMAL HIGH (ref 65–99)

## 2016-09-07 LAB — BASIC METABOLIC PANEL
Anion gap: 11 (ref 5–15)
BUN: 91 mg/dL — AB (ref 6–20)
CALCIUM: 8.1 mg/dL — AB (ref 8.9–10.3)
CO2: 27 mmol/L (ref 22–32)
CREATININE: 3.1 mg/dL — AB (ref 0.61–1.24)
Chloride: 95 mmol/L — ABNORMAL LOW (ref 101–111)
GFR calc Af Amer: 21 mL/min — ABNORMAL LOW (ref >60)
GFR, EST NON AFRICAN AMERICAN: 18 mL/min — AB (ref >60)
Glucose, Bld: 235 mg/dL — ABNORMAL HIGH (ref 65–99)
POTASSIUM: 3.6 mmol/L (ref 3.5–5.1)
SODIUM: 133 mmol/L — AB (ref 135–145)

## 2016-09-07 MED ORDER — INSULIN DETEMIR 100 UNIT/ML ~~LOC~~ SOLN
38.0000 [IU] | Freq: Every day | SUBCUTANEOUS | Status: DC
  Administered 2016-09-07 – 2016-09-08 (×2): 38 [IU] via SUBCUTANEOUS
  Filled 2016-09-07 (×4): qty 0.38

## 2016-09-07 MED ORDER — FUROSEMIDE 10 MG/ML IJ SOLN
100.0000 mg | Freq: Two times a day (BID) | INTRAMUSCULAR | Status: DC
  Administered 2016-09-07 – 2016-09-08 (×2): 100 mg via INTRAVENOUS
  Filled 2016-09-07 (×2): qty 10

## 2016-09-07 NOTE — Progress Notes (Signed)
PROGRESS NOTE    VIRL DONSON  [id number] DOB: 1941/07/23 DOA: 09/05/2016 PCP: Gar Ponto, MD     Brief Narrative:  75 y/o man admitted from home on 12/15 due to anasarca. Believed to have acute CHF and admission requested.   Assessment & Plan:   Active Problems:   Poorly controlled type 2 diabetes mellitus (HCC)   Diabetic nephropathy (HCC)   Chronic kidney disease, stage IV (severe) (HCC)   Acute on chronic systolic heart failure, NYHA class 3 (HCC)   Anasarca   Acute exacerbation of CHF (congestive heart failure) (HCC)   Acute on Chronic Combined CHF -ECHO 10/17 with EF 35-40% and LVH. -Low albumin of 2.8 also contributing to edema. -Renal assisting with diuresis. Lasix increased to 100 BID. -1.7 L negative since admission.  Stage IV CKD -Cr baseline of 2-6-2.8. -Up to 3.10 with diuresis. -Given edema and advanced CKD, will request renal consultation, they have recommended increasing lasix dose. -May need HD if fails to diurese or CR keeps rising.  CAD -Stable, no CP.  Hypokalemia -Replaced  Bilateral LE wounds -Appreciate wound care recommendations.   DVT prophylaxis: lovenox Code Status: full code Family Communication: wife at bedside Disposition Plan: pending medical evolution and PT recommendations  Consultants:   None  Procedures:   None  Antimicrobials:  Anti-infectives    None       Subjective: Feels anxious, feels like nothing has been done for him, wanted to leave AMA earlier today  Objective: Vitals:   09/06/16 1317 09/06/16 2143 09/07/16 0500 09/07/16 1438  BP: 127/67 119/66 132/74 139/70  Pulse: (!) 105 98 65 (!) 57  Resp: 16 20 (!) 21 20  Temp: 98.1 F (36.7 C) 98.6 F (37 C) 98.3 F (36.8 C) 98.2 F (36.8 C)  TempSrc: Oral Oral Oral   SpO2: 95% 94% 93% 95%  Weight:   103.5 kg (228 lb 2.8 oz)   Height:        Intake/Output Summary (Last 24 hours) at 09/07/16 1545 Last data filed at 09/07/16 1300  Gross per  24 hour  Intake              363 ml  Output             1300 ml  Net             -937 ml   Filed Weights   09/06/16 0007 09/06/16 0652 09/07/16 0500  Weight: 101.9 kg (224 lb 9.6 oz) 102.1 kg (225 lb) 103.5 kg (228 lb 2.8 oz)    Examination:  General exam: Alert, awake, oriented x 3 Respiratory system: Clear to auscultation. Respiratory effort normal. Cardiovascular system:RRR. No murmurs, rubs, gallops. Gastrointestinal system: distended Central nervous system: Alert and oriented. No focal neurological deficits. Extremities: 2-3+ edema, covered in clean dressing Skin: No rashes, lesions or ulcers Psychiatry: mood is anxious    Data Reviewed: I have personally reviewed following labs and imaging studies  CBC:  Recent Labs Lab 09/05/16 1954  WBC 8.1  NEUTROABS 6.6  HGB 11.1*  HCT 34.1*  MCV 105.2*  PLT [id number]   Basic Metabolic Panel:  Recent Labs Lab 09/05/16 1954 09/06/16 0549 09/07/16 0507  NA 133* 133* 133*  K 3.1* 3.6 3.6  CL 91* 95* 95*  CO2 30 27 27   GLUCOSE 97 149* 235*  BUN 82* 82* 91*  CREATININE 2.73* 2.80* 3.10*  CALCIUM 8.2* 8.1* 8.1*   GFR: Estimated Creatinine Clearance: 25.8 mL/min (by C-G formula  based on SCr of 3.1 mg/dL (H)). Liver Function Tests:  Recent Labs Lab 09/05/16 1954  AST 26  ALT 16*  ALKPHOS 96  BILITOT 1.2  PROT 7.7  ALBUMIN 2.8*   No results for input(s): LIPASE, AMYLASE in the last 168 hours.  Recent Labs Lab 09/05/16 2016  AMMONIA 20   Coagulation Profile: No results for input(s): INR, PROTIME in the last 168 hours. Cardiac Enzymes: No results for input(s): CKTOTAL, CKMB, CKMBINDEX, TROPONINI in the last 168 hours. BNP (last 3 results) No results for input(s): PROBNP in the last 8760 hours. HbA1C: No results for input(s): HGBA1C in the last 72 hours. CBG:  Recent Labs Lab 09/06/16 1150 09/07/16 0653  GLUCAP 252* 219*   Lipid Profile: No results for input(s): CHOL, HDL, LDLCALC, TRIG, CHOLHDL,  LDLDIRECT in the last 72 hours. Thyroid Function Tests: No results for input(s): TSH, T4TOTAL, FREET4, T3FREE, THYROIDAB in the last 72 hours. Anemia Panel: No results for input(s): VITAMINB12, FOLATE, FERRITIN, TIBC, IRON, RETICCTPCT in the last 72 hours. Urine analysis:    Component Value Date/Time   COLORURINE YELLOW 07/13/2016 [place] 07/13/2016 1355   LABSPEC <1.005 (L) 07/13/2016 1355   PHURINE 6.5 07/13/2016 1355   GLUCOSEU >1000 (A) 07/13/2016 1355   HGBUR NEGATIVE 07/13/2016 1355   BILIRUBINUR NEGATIVE 07/13/2016 1355   KETONESUR NEGATIVE 07/13/2016 1355   PROTEINUR NEGATIVE 07/13/2016 1355   UROBILINOGEN 0.2 10/27/2013 1136   NITRITE NEGATIVE 07/13/2016 1355   LEUKOCYTESUR NEGATIVE 07/13/2016 1355   Sepsis Labs: @LABRCNTIP (procalcitonin:4,lacticidven:4)  ) Recent Results (from the past 240 hour(s))  Culture, blood (routine x 2)     Status: None (Preliminary result)   Collection Time: 09/05/16  7:53 PM  Result Value Ref Range Status   Specimen Description RIGHT ANTECUBITAL  Final   Special Requests BOTTLES DRAWN AEROBIC AND ANAEROBIC 6CC EACH  Final   Culture  Setup Time   Final    GRAM POSITIVE COCCI CRITICAL RESULT CALLED TO, READ BACK BY AND VERIFIED WITH: STOCUM,B. AT 2331 ON 09/06/2016 BY [facility]A AEROBIC BOTTLE ONLY Performed at [place] Performed at [facility]    Report Status PENDING  Incomplete         Radiology Studies: No results found.      Scheduled Meds: . atorvastatin  40 mg Oral q1800  . enoxaparin (LOVENOX) injection  30 mg Subcutaneous Q24H  . feeding supplement (ENSURE ENLIVE)  237 mL Oral BID BM  . feeding supplement (PRO-STAT SUGAR FREE 64)  30 mL Oral TID  . furosemide  100 mg Intravenous Q12H  . insulin detemir  38 Units Subcutaneous Daily  . metoprolol succinate  50 mg Oral BID  . sodium chloride flush  3 mL Intravenous Q12H   Continuous  Infusions:   LOS: 1 day    Time spent: 25 minutes. Greater than 50% of this time was spent in direct contact with the patient coordinating care.     Lelon Frohlich, MD [facility] Pager [phone]  If 7PM-7AM, please contact night-coverage [email or web address] Password [email or web address] 09/07/2016, 3:45 PM

## 2016-09-07 NOTE — Consult Note (Signed)
Reason for Consult: Anasarca and worsening of renal failure. Referring Physician: Dr. Philip Aspen  Chris [name] is an 75 y.o. male.  HPI: He is a patient was history of hypertension, CVA, history of ischemic cardiomyopathy was no ejection fraction and chronic renal failure stage IV presently came with complaints of increasing leg swelling and some difficulty breathing mainly exertional. Presently consult is called because of worsening of his renal failure. Patient has this moment denies any nausea or vomiting. He is feeling somewhat better. He denies any chest pain.  Past Medical History:  Diagnosis Date  . Acute cerebrovascular accident [facility])    Presumed embolic from his atrial fibrillation with hemorrhagic conbersion  . Atrial fibrillation (HCC)    Not anticoagulated - chronic epistaxis as well as hemorrhagic conversion of prior stroke  . CAD (coronary artery disease)    Multivessel status post CABG  . Chronic leg pain   . Chronic systolic heart failure (HCC)   . Confusion   . Essential hypertension   . History of medication noncompliance    "due to confusion"  . Hyperlipidemia   . Ischemic cardiomyopathy    LVEF 35-40%  . Macrocytosis    Normal B12 & folate levels  . Myocardial infarct   . Pedal edema   . Pleural effusion 10/05/2013  . Tubular adenoma   . Type 2 diabetes mellitus (HCC)     Past Surgical History:  Procedure Laterality Date  . CHEST TUBE INSERTION Right 10/31/2013   Procedure: INSERTION PLEURAL DRAINAGE CATHETER;  Surgeon: Loreli Slot, MD;  Location: [facility] OR;  Service: Thoracic;  Laterality: Right;  . CHOLECYSTECTOMY  2008  . COLONOSCOPY N/A 12/23/2012   Dr. Jena Gauss: multiple rectal and colonic polyps removed, tubular adenomas. needs surveillance April 2017.   Marland Kitchen CORONARY ARTERY BYPASS GRAFT    . ESOPHAGOGASTRODUODENOSCOPY (EGD) WITH ESOPHAGEAL DILATION N/A 10/27/2012   Dr. Jena Gauss: distal esophageal diverticulum, non-complete Schatzki's ring s/p dilation,  distal esophageal nodule with benign path, negative Barrett's  . PLEURAL EFFUSION DRAINAGE Right 10/31/2013   Procedure: DRAINAGE OF PLEURAL EFFUSION;  Surgeon: Loreli Slot, MD;  Location: [facility] OR;  Service: Thoracic;  Laterality: Right;  . TONSILLECTOMY    . VIDEO ASSISTED THORACOSCOPY Right 10/31/2013   Procedure: VIDEO ASSISTED THORACOSCOPY;  Surgeon: Loreli Slot, MD;  Location: [facility] OR;  Service: Thoracic;  Laterality: Right;    Family History  Problem Relation Age of Onset  . Colon polyps Sister   . Colon polyps Sister   . Colon cancer Neg Hx     Social History:  reports that he has never smoked. He has never used smokeless tobacco. He reports that he does not drink alcohol or use drugs.  Allergies:  Allergies  Allergen Reactions  . Ciprofloxacin Other (See Comments)    Sores all on legs.   . Finasteride     Affected  Patients testicles  . Lisinopril Other (See Comments)    Kidney damage    . Metoclopramide     Jerking all over  . Protonix [Pantoprazole Sodium]     abd pain  . Xanax [Alprazolam] Other (See Comments)    Not in right state of mind.     Medications: I have reviewed the patient's current medications.  Results for orders placed or performed during the hospital encounter of 09/05/16 (from the past 48 hour(s))  Culture, blood (routine x 2)     Status: None (Preliminary result)   Collection Time: 09/05/16  7:53 PM  Result Value Ref Range   Specimen Description RIGHT ANTECUBITAL    Special Requests BOTTLES DRAWN AEROBIC AND ANAEROBIC 6CC EACH    Culture  Setup Time      GRAM POSITIVE COCCI Gram Stain Report Called to,Read Back By and Verified With: STOCUM,B. AT 2331 ON 09/06/2016 BY EVA  FROM AEROBIC BOTTLE AT [facility]   Culture PENDING    Report Status PENDING   CBC with Differential/Platelet     Status: Abnormal   Collection Time: 09/05/16  7:54 PM  Result Value Ref Range   WBC 8.1 4.0 - 10.5 K/uL   RBC 3.24 (L) 4.22 - 5.81 MIL/uL   Hemoglobin  11.1 (L) 13.0 - 17.0 g/dL   HCT 34.1 (L) 39.0 - 52.0 %   MCV 105.2 (H) 78.0 - 100.0 fL   MCH 34.3 (H) 26.0 - 34.0 pg   MCHC 32.6 30.0 - 36.0 g/dL   RDW 14.4 11.5 - 15.5 %   Platelets 284 150 - 400 K/uL   Neutrophils Relative % 81 %   Neutro Abs 6.6 1.7 - 7.7 K/uL   Lymphocytes Relative 11 %   Lymphs Abs 0.9 0.7 - 4.0 K/uL   Monocytes Relative 8 %   Monocytes Absolute 0.6 0.1 - 1.0 K/uL   Eosinophils Relative 0 %   Eosinophils Absolute 0.0 0.0 - 0.7 K/uL   Basophils Relative 0 %   Basophils Absolute 0.0 0.0 - 0.1 K/uL  Comprehensive metabolic panel     Status: Abnormal   Collection Time: 09/05/16  7:54 PM  Result Value Ref Range   Sodium 133 (L) 135 - 145 mmol/L   Potassium 3.1 (L) 3.5 - 5.1 mmol/L   Chloride 91 (L) 101 - 111 mmol/L   CO2 30 22 - 32 mmol/L   Glucose, Bld 97 65 - 99 mg/dL   BUN 82 (H) 6 - 20 mg/dL   Creatinine, Ser 2.73 (H) 0.61 - 1.24 mg/dL   Calcium 8.2 (L) 8.9 - 10.3 mg/dL   Total Protein 7.7 6.5 - 8.1 g/dL   Albumin 2.8 (L) 3.5 - 5.0 g/dL   AST 26 15 - 41 U/L   ALT 16 (L) 17 - 63 U/L   Alkaline Phosphatase 96 38 - 126 U/L   Total Bilirubin 1.2 0.3 - 1.2 mg/dL   GFR calc non Af Amer 21 (L) >60 mL/min   GFR calc Af Amer 24 (L) >60 mL/min    Comment: (NOTE) The eGFR has been calculated using the CKD EPI equation. This calculation has not been validated in all clinical situations. eGFR's persistently <60 mL/min signify possible Chronic Kidney Disease.    Anion gap 12 5 - 15  Brain natriuretic peptide     Status: Abnormal   Collection Time: 09/05/16  7:54 PM  Result Value Ref Range   B Natriuretic Peptide 490.0 (H) 0.0 - 100.0 pg/mL  Ammonia     Status: None   Collection Time: 09/05/16  8:16 PM  Result Value Ref Range   Ammonia 20 9 - 35 umol/L  Basic metabolic panel     Status: Abnormal   Collection Time: 09/06/16  5:49 AM  Result Value Ref Range   Sodium 133 (L) 135 - 145 mmol/L   Potassium 3.6 3.5 - 5.1 mmol/L   Chloride 95 (L) 101 - 111 mmol/L    CO2 27 22 - 32 mmol/L   Glucose, Bld 149 (H) 65 - 99 mg/dL   BUN 82 (H) 6 - 20 mg/dL  Creatinine, Ser 2.80 (H) 0.61 - 1.24 mg/dL   Calcium 8.1 (L) 8.9 - 10.3 mg/dL   GFR calc non Af Amer 20 (L) >60 mL/min   GFR calc Af Amer 24 (L) >60 mL/min    Comment: (NOTE) The eGFR has been calculated using the CKD EPI equation. This calculation has not been validated in all clinical situations. eGFR's persistently <60 mL/min signify possible Chronic Kidney Disease.    Anion gap 11 5 - 15  Glucose, capillary     Status: Abnormal   Collection Time: 09/06/16 11:50 AM  Result Value Ref Range   Glucose-Capillary 252 (H) 65 - 99 mg/dL   Comment 1 Notify RN   Basic metabolic panel     Status: Abnormal   Collection Time: 09/07/16  5:07 AM  Result Value Ref Range   Sodium 133 (L) 135 - 145 mmol/L   Potassium 3.6 3.5 - 5.1 mmol/L   Chloride 95 (L) 101 - 111 mmol/L   CO2 27 22 - 32 mmol/L   Glucose, Bld 235 (H) 65 - 99 mg/dL   BUN 91 (H) 6 - 20 mg/dL   Creatinine, Ser 3.10 (H) 0.61 - 1.24 mg/dL   Calcium 8.1 (L) 8.9 - 10.3 mg/dL   GFR calc non Af Amer 18 (L) >60 mL/min   GFR calc Af Amer 21 (L) >60 mL/min    Comment: (NOTE) The eGFR has been calculated using the CKD EPI equation. This calculation has not been validated in all clinical situations. eGFR's persistently <60 mL/min signify possible Chronic Kidney Disease.    Anion gap 11 5 - 15  Glucose, capillary     Status: Abnormal   Collection Time: 09/07/16  6:53 AM  Result Value Ref Range   Glucose-Capillary 219 (H) 65 - 99 mg/dL    No results found.  Review of Systems  Constitutional: Negative for chills and fever.  Respiratory: Positive for shortness of breath. Negative for cough and sputum production.   Cardiovascular: Positive for orthopnea and leg swelling. Negative for chest pain.  Gastrointestinal: Negative for abdominal pain, nausea and vomiting.   Blood pressure 132/74, pulse 65, temperature 98.3 F (36.8 C), temperature  source Oral, resp. rate (!) 21, height 6' 2.5" (1.892 m), weight 103.5 kg (228 lb 2.8 oz), SpO2 93 %. Physical Exam  Constitutional: He is oriented to person, place, and time.  Neck: JVD present.  Cardiovascular: Normal rate and regular rhythm.   Respiratory: No respiratory distress. He has no wheezes.  GI: He exhibits distension. There is no tenderness. There is no rebound.  Musculoskeletal: He exhibits edema.  Neurological: He is alert and oriented to person, place, and time.    Assessment/Plan: Problem #1 renal failure: Possibly acute on chronic. The present increase in his renal failure could be secondary to prerenal syndrome/cardiorenal. Patient does not have any uremic signs and symptoms. Problem #2 chronic renal failure: Stage IV.                     His creatinine was 1.93 on 09/18/09                                                    2.02 on 08/05/13  2.29 on 11/01/13                                                    2.99 on 07/14/16 with EGFR of 19 mL/m. Etiology for his chronic renal failure was thought to be secondary to diabetes/hypertension/cardiorenal/ischemic. Problem #3 ischemic cardiomyopathy with ejection fraction of 35-40%. Patient was edema and has been on Lasix as an outpatient without significant response. Problem #4 hypertension: His blood pressure is reasonably controlled Problem #5 history of mitral fibrillation Problem #6 history of pleural effusion status post drainage Problem #7 history of CVA Plan: We'll increase Lasix to 100 mg IV twice a day We'll check his renal panel and CBC in the morning. If his renal function continue to decline patient may probably require dialysis. And I have discussed with the patient and his wife.  Jaquesha Boroff S 09/07/2016, 9:14 AM

## 2016-09-08 DIAGNOSIS — I509 Heart failure, unspecified: Secondary | ICD-10-CM

## 2016-09-08 LAB — CBC
HCT: 32.2 % — ABNORMAL LOW (ref 39.0–52.0)
HEMOGLOBIN: 10.7 g/dL — AB (ref 13.0–17.0)
MCH: 35.3 pg — ABNORMAL HIGH (ref 26.0–34.0)
MCHC: 33.2 g/dL (ref 30.0–36.0)
MCV: 106.3 fL — AB (ref 78.0–100.0)
PLATELETS: 264 10*3/uL (ref 150–400)
RBC: 3.03 MIL/uL — AB (ref 4.22–5.81)
RDW: 14.6 % (ref 11.5–15.5)
WBC: 6.7 10*3/uL (ref 4.0–10.5)

## 2016-09-08 LAB — GLUCOSE, CAPILLARY
GLUCOSE-CAPILLARY: 201 mg/dL — AB (ref 65–99)
GLUCOSE-CAPILLARY: 116 mg/dL — AB (ref 65–99)
GLUCOSE-CAPILLARY: 87 mg/dL (ref 65–99)
GLUCOSE-CAPILLARY: 89 mg/dL (ref 65–99)
Glucose-Capillary: 211 mg/dL — ABNORMAL HIGH (ref 65–99)
Glucose-Capillary: 184 mg/dL — ABNORMAL HIGH (ref 65–99)

## 2016-09-08 LAB — RENAL FUNCTION PANEL
ANION GAP: 12 (ref 5–15)
Albumin: 2.4 g/dL — ABNORMAL LOW (ref 3.5–5.0)
BUN: 101 mg/dL — ABNORMAL HIGH (ref 6–20)
CALCIUM: 8.1 mg/dL — AB (ref 8.9–10.3)
CHLORIDE: 92 mmol/L — AB (ref 101–111)
CO2: 30 mmol/L (ref 22–32)
CREATININE: 3.16 mg/dL — AB (ref 0.61–1.24)
GFR, EST AFRICAN AMERICAN: 20 mL/min — AB (ref >60)
GFR, EST NON AFRICAN AMERICAN: 18 mL/min — AB (ref >60)
Glucose, Bld: 212 mg/dL — ABNORMAL HIGH (ref 65–99)
Phosphorus: 4.8 mg/dL — ABNORMAL HIGH (ref 2.5–4.6)
Potassium: 3.7 mmol/L (ref 3.5–5.1)
SODIUM: 134 mmol/L — AB (ref 135–145)

## 2016-09-08 LAB — CULTURE, BLOOD (ROUTINE X 2)

## 2016-09-08 MED ORDER — FUROSEMIDE 80 MG PO TABS
80.0000 mg | ORAL_TABLET | Freq: Every day | ORAL | Status: DC
  Administered 2016-09-08: 80 mg via ORAL
  Filled 2016-09-08: qty 1

## 2016-09-08 MED ORDER — TORSEMIDE 20 MG PO TABS: 40.0000 mg | ORAL_TABLET | Freq: Every day | ORAL | Status: DC

## 2016-09-08 MED ORDER — INSULIN ASPART 100 UNIT/ML ~~LOC~~ SOLN
0.0000 [IU] | Freq: Three times a day (TID) | SUBCUTANEOUS | Status: DC
  Administered 2016-09-08: 3 [IU] via SUBCUTANEOUS

## 2016-09-08 NOTE — Progress Notes (Signed)
Inpatient Diabetes Program Recommendations  AACE/ADA: New Consensus Statement on Inpatient Glycemic Control (2015)  Target Ranges:  Prepandial:   less than 140 mg/dL      Peak postprandial:   less than 180 mg/dL (1-2 hours)      Critically ill patients:  140 - 180 mg/dL  Results for Chris, [name] (MRN [id number]) as of 09/08/2016 08:05  Ref. Range 09/07/2016 06:53 09/07/2016 21:46 09/08/2016 06:19 09/08/2016 07:29  Glucose-Capillary Latest Ref Range: 65 - 99 mg/dL 219 (H) 238 (H) 211 (H) 184 (H)    Review of Glycemic Control  Diabetes history: DM2 Outpatient Diabetes medications: Levemir 30 units QAM Current orders for Inpatient glycemic control: Levemir 38 units daily  Inpatient Diabetes Program Recommendations: Insulin - Basal: Please consider increasing Levemir to 40 units daily. Correction (SSI): Please consider ordering CBGs with Novolog Moderate correction scale ACHS.  Thanks, Barnie Alderman, RN, MSN, CDE Diabetes Coordinator Inpatient Diabetes Program [phone] (Team Pager from 8am to 5pm)
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
## 2016-09-08 NOTE — Progress Notes (Signed)
PROGRESS NOTE    Chris [name]  [id number] DOB: Nov 07, 1940 DOA: 09/05/2016 PCP: Gar Ponto, MD     Brief Narrative:  75 y/o man admitted from home on 12/15 due to anasarca. Believed to have acute CHF and admission requested.   Assessment & Plan:   Active Problems:   Poorly controlled type 2 diabetes mellitus (HCC)   Diabetic nephropathy (HCC)   Chronic kidney disease, stage IV (severe) (HCC)   Acute on chronic systolic heart failure, NYHA class 3 (HCC)   Anasarca   Acute exacerbation of CHF (congestive heart failure) (HCC)   Acute on Chronic Combined CHF -ECHO 10/17 with EF 35-40% and LVH. -Low albumin of 2.8 also contributing to edema. -Renal assisting with diuresis. Received  Lasix  100 BID. -2  L negative since admission. -lasix change to oral.   Stage IV CKD -Cr baseline of 2-6-2.8. -Up to 3.10 with diuresis. - cr stable,. Change to oral lasix.   CAD -Stable, no CP.  DM;  Continue with levemir. Wife report poor oral intake. Will not increase insulin.  Will order SSI.   Hypokalemia -Replaced  Bilateral LE wounds -Appreciate wound care recommendations.  Blood culture; Staph coagulase negative; one out of 2. Likely contaminate.     DVT prophylaxis: lovenox Code Status: full code Family Communication: wife at bedside Disposition Plan: will benefit from rehab.  Consultants:   None  Procedures:   None  Antimicrobials:  Anti-infectives    None       Subjective: He denies dyspnea.  Wife is at bedside. She report that he does not follow fluid restriction diet,  Will be difficult for wife to take care of patient at home.  He relates that abdomen is less distended.   Objective: Vitals:   09/07/16 2321 09/08/16 0452 09/08/16 0513 09/08/16 0915  BP: (!) 100/51 (!) 105/43  (!) 118/56  Pulse: 84 92  (!) 102  Resp: 18 20    Temp: 98 F (36.7 C) 98.6 F (37 C)    TempSrc: Oral Oral    SpO2: 100% 100%    Weight:   102.9 kg (226 lb 12.8  oz)   Height:        Intake/Output Summary (Last 24 hours) at 09/08/16 1131 Last data filed at 09/08/16 0920  Gross per 24 hour  Intake              546 ml  Output             1950 ml  Net            -1404 ml   Filed Weights   09/06/16 0652 09/07/16 0500 09/08/16 0513  Weight: 102.1 kg (225 lb) 103.5 kg (228 lb 2.8 oz) 102.9 kg (226 lb 12.8 oz)    Examination:  General exam: Alert, awake, oriented x 3 Respiratory system: Clear to auscultation. Respiratory effort normal. Cardiovascular system:RRR. No murmurs, rubs, gallops. Gastrointestinal system: distended Central nervous system: Alert and oriented. No focal neurological deficits. Extremities: 2-3+ edema, covered in clean dressing Psychiatry: mood is anxious    Data Reviewed: I have personally reviewed following labs and imaging studies  CBC:  Recent Labs Lab 09/05/16 1954 09/08/16 0450  WBC 8.1 6.7  NEUTROABS 6.6  --   HGB 11.1* 10.7*  HCT 34.1* 32.2*  MCV 105.2* 106.3*  PLT 284 [id number]   Basic Metabolic Panel:  Recent Labs Lab 09/05/16 1954 09/06/16 0549 09/07/16 0507 09/08/16 0450  NA 133* 133* 133* 134*  K 3.1* 3.6 3.6 3.7  CL 91* 95* 95* 92*  CO2 30 27 27 30   GLUCOSE 97 149* 235* 212*  BUN 82* 82* 91* 101*  CREATININE 2.73* 2.80* 3.10* 3.16*  CALCIUM 8.2* 8.1* 8.1* 8.1*  PHOS  --   --   --  4.8*   GFR: Estimated Creatinine Clearance: 25.3 mL/min (by C-G formula based on SCr of 3.16 mg/dL (H)). Liver Function Tests:  Recent Labs Lab 09/05/16 1954 09/08/16 0450  AST 26  --   ALT 16*  --   ALKPHOS 96  --   BILITOT 1.2  --   PROT 7.7  --   ALBUMIN 2.8* 2.4*   No results for input(s): LIPASE, AMYLASE in the last 168 hours.  Recent Labs Lab 09/05/16 2016  AMMONIA 20   Coagulation Profile: No results for input(s): INR, PROTIME in the last 168 hours. Cardiac Enzymes: No results for input(s): CKTOTAL, CKMB, CKMBINDEX, TROPONINI in the last 168 hours. BNP (last 3 results) No results for  input(s): PROBNP in the last 8760 hours. HbA1C: No results for input(s): HGBA1C in the last 72 hours. CBG:  Recent Labs Lab 09/06/16 1150 09/07/16 0653 09/07/16 2146 09/08/16 0619 09/08/16 0729  GLUCAP 252* 219* 238* 211* 184*   Lipid Profile: No results for input(s): CHOL, HDL, LDLCALC, TRIG, CHOLHDL, LDLDIRECT in the last 72 hours. Thyroid Function Tests: No results for input(s): TSH, T4TOTAL, FREET4, T3FREE, THYROIDAB in the last 72 hours. Anemia Panel: No results for input(s): VITAMINB12, FOLATE, FERRITIN, TIBC, IRON, RETICCTPCT in the last 72 hours. Urine analysis:    Component Value Date/Time   COLORURINE YELLOW 07/13/2016 [place] 07/13/2016 1355   LABSPEC <1.005 (L) 07/13/2016 1355   PHURINE 6.5 07/13/2016 1355   GLUCOSEU >1000 (A) 07/13/2016 1355   HGBUR NEGATIVE 07/13/2016 1355   BILIRUBINUR NEGATIVE 07/13/2016 1355   KETONESUR NEGATIVE 07/13/2016 1355   PROTEINUR NEGATIVE 07/13/2016 1355   UROBILINOGEN 0.2 10/27/2013 1136   NITRITE NEGATIVE 07/13/2016 1355   LEUKOCYTESUR NEGATIVE 07/13/2016 1355   Sepsis Labs: @LABRCNTIP (procalcitonin:4,lacticidven:4)  ) Recent Results (from the past 240 hour(s))  Culture, blood (routine x 2)     Status: Abnormal   Collection Time: 09/05/16  7:53 PM  Result Value Ref Range Status   Specimen Description RIGHT ANTECUBITAL  Final   Special Requests BOTTLES DRAWN AEROBIC AND ANAEROBIC [facility] EACH  Final   Culture  Setup Time   Final    GRAM POSITIVE COCCI CRITICAL RESULT CALLED TO, READ BACK BY AND VERIFIED WITH: STOCUM,B. AT 2331 ON 09/06/2016 BY EVA AEROBIC BOTTLE ONLY Performed at [facility]    Culture (A)  Final    STAPHYLOCOCCUS SPECIES (COAGULASE NEGATIVE) THE SIGNIFICANCE OF ISOLATING THIS ORGANISM FROM A SINGLE SET OF BLOOD CULTURES WHEN MULTIPLE SETS ARE DRAWN IS UNCERTAIN. PLEASE NOTIFY THE MICROBIOLOGY DEPARTMENT WITHIN ONE WEEK IF SPECIATION AND SENSITIVITIES ARE REQUIRED. Performed at  [facility]    Report Status 09/08/2016 FINAL  Final         Radiology Studies: No results found.      Scheduled Meds: . atorvastatin  40 mg Oral q1800  . enoxaparin (LOVENOX) injection  30 mg Subcutaneous Q24H  . feeding supplement (ENSURE ENLIVE)  237 mL Oral BID BM  . feeding supplement (PRO-STAT SUGAR FREE 64)  30 mL Oral TID  . furosemide  80 mg Oral Daily  . insulin aspart  0-9 Units Subcutaneous TID WC  . insulin detemir  38 Units Subcutaneous Daily  . metoprolol succinate  50 mg Oral BID  . sodium chloride flush  3 mL Intravenous Q12H   Continuous Infusions:   LOS: 2 days    Time spent: 25 minutes. Greater than 50% of this time was spent in direct contact with the patient coordinating care.     Elmarie Shiley, MD [facility] Pager [phone]  If 7PM-7AM, please contact night-coverage [email or web address] Password [email or web address] 09/08/2016, 11:31 AM

## 2016-09-08 NOTE — Consult Note (Signed)
[facility] Inpatient Consult   09/08/2016  PRINCETIN BIHL Mar 26, 1941 [id number]   Patient is currently active with [place] [facility] for chronic disease management services.  Patient has been engaged by a [employer]. Community case manager aware of patient's admission and has been following up with patient closely. Our community based plan of care has focused on disease management and community resource support.  Patient will receive a post discharge transition of care calls and  monthly home visits for assessments and disease process education if discharged to home. If discharged to SNF [facility] CSW will follow up at SNF to assist with discharge planning. Made Inpatient Case Manager aware that [place] [facility] following. Of note, [facility] Care Management services does not replace or interfere with any services that are needed or arranged by inpatient case management or social work.  For additional questions or referrals please contact:  Jianni Shelden RN, [facility] Liaison  [phone]) [place] [phone]) Toll free office

## 2016-09-08 NOTE — Clinical Social Work Note (Signed)
Clinical Social Work Assessment  Patient Details  Name: Chris [name] MRN: [id number] Date of Birth: 1940/12/20  Date of referral:  09/08/16               Reason for consult:  Discharge Planning                Permission sought to share information with:    Permission granted to share information::     Name::        Agency::     Relationship::     Contact Information:  Spouse, Mrs. Barse, was at bedside and is listed on chart.   Housing/Transportation Living arrangements for the past 2 months:  Single Family Home Source of Information:  Spouse Patient Interpreter Needed:  None Criminal Activity/Legal Involvement Pertinent to Current Situation/Hospitalization:  No - Comment as needed Significant Relationships:  Adult Children, Spouse Lives with:    Do you feel safe going back to the place where you live?  Yes Need for family participation in patient care:  Yes (Comment)  Care giving concerns:  Patient has [place] and wife adheres to care giving concerns.    Social Worker assessment / plan:  Mrs. Mcdorman stated that she and patient have been married for 60 years. Patient ambulates with a cane, and that she and home health service assist with ADLs.  CSW discussed PT evaluation.  Mrs. Freelove stated that she felt it would be best if patient had his rehab at home rather than a facility. She stated that she believes that it would be easier on patient to be in his home setting. CSW provided a SNF list in the event patient reconsidered.  CSW signing off. Please re-consult if needed.   Employment status:  Part-Time Nurse, adult PT Recommendations:  [place] / Referral to community resources:  [place]  Patient/Family's Response to care:  Spouse would like patient to have his rehab in his home.  Patient/Family's Understanding of and Emotional Response to Diagnosis, Current Treatment, and Prognosis:   Patient's  wife understands patient's diagnosis, treatment and prognosis. Patient was asleep.   Emotional Assessment Appearance:  Appears stated age Attitude/Demeanor/Rapport:  Unable to Assess (Patient was asleep.) Affect (typically observed):  Unable to Assess (Patient was asleep.) Orientation:  Oriented to Self, Oriented to Place, Oriented to Situation Alcohol / Substance use:  Not Applicable Psych involvement (Current and /or in the community):  No (Comment)  Discharge Needs  Concerns to be addressed:  Discharge Planning Concerns Readmission within the last 30 days:  No Current discharge risk:  None Barriers to Discharge:  No Barriers Identified   Ihor Gully, LCSW 09/08/2016, 3:22 PM

## 2016-09-08 NOTE — Progress Notes (Signed)
Pharmacist Heart Failure Core Measure Documentation  Assessment: Chris [name] has an EF documented as 35-40 on 07/14/16   Rationale: Heart failure patients with left ventricular systolic dysfunction (LVSD) and an EF < 40% should be prescribed an angiotensin converting enzyme inhibitor (ACEI) or angiotensin receptor blocker (ARB) at discharge unless a contraindication is documented in the medical record.  This patient is not currently on an ACEI or ARB for HF.  This note is being placed in the record in order to provide documentation that a contraindication to the use of these agents is present for this encounter.  ACE Inhibitor or Angiotensin Receptor Blocker is contraindicated (specify all that apply)  []   ACEI allergy AND ARB allergy []   Angioedema []   Moderate or severe aortic stenosis []   Hyperkalemia []   Hypotension []   Renal artery stenosis [x]   Worsening renal function, preexisting renal disease or dysfunction   [name] A 09/08/2016 2:45 PM

## 2016-09-08 NOTE — Progress Notes (Signed)
Subjective: Interval History: has no complaint of difficulty in breathing. Patient feels somewhat weak. He doesn't have any nausea or vomiting..  Objective: Vital signs in last 24 hours: Temp:  [98 F (36.7 C)-98.6 F (37 C)] 98.6 F (37 C) (12/18 0452) Pulse Rate:  [57-92] 92 (12/18 0452) Resp:  [18-20] 20 (12/18 0452) BP: (100-139)/(43-70) 105/43 (12/18 0452) SpO2:  [95 %-100 %] 100 % (12/18 0452) Weight:  [102.9 kg (226 lb 12.8 oz)] 102.9 kg (226 lb 12.8 oz) (12/18 0513) Weight change: -0.624 kg (-1 lb 6 oz)  Intake/Output from previous day: 12/17 0701 - 12/18 0700 In: 423 [P.O.:360; I.V.:3; IV Piggyback:60] Out: 1700 [Urine:1700] Intake/Output this shift: No intake/output data recorded.  General appearance: alert, cooperative and no distress Resp: diminished breath sounds bilaterally Cardio: regular rate and rhythm GI: Distended, nontender Extremities: edema He has 2+ edema  Lab Results:  Recent Labs  09/05/16 1954 09/08/16 0450  WBC 8.1 6.7  HGB 11.1* 10.7*  HCT 34.1* 32.2*  PLT 284 264   BMET:  Recent Labs  09/07/16 0507 09/08/16 0450  NA 133* 134*  K 3.6 3.7  CL 95* 92*  CO2 27 30  GLUCOSE 235* 212*  BUN 91* 101*  CREATININE 3.10* 3.16*  CALCIUM 8.1* 8.1*   No results for input(s): PTH in the last 72 hours. Iron Studies: No results for input(s): IRON, TIBC, TRANSFERRIN, FERRITIN in the last 72 hours.  Studies/Results: No results found.  I have reviewed the patient's current medications.  Assessment/Plan: Problem #1 anasarca: Presently patient on diuretics. His urine output has improved. He has 1700 mL of urine output. Patient denies any difficulty breathing but still has significant edema. Problem #2 acute kidney injury superimposed on chronic: Possibly secondary to cardiorenal. Presently is pending creatinine continued to increase with increase urine output. Patient however remains asymptomatic. His potassium is normal. 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 Presently patient is not on Epogen. Problem #7 history of coronary artery disease Problem #8 history of diabetes  Plan: We'll DC Lasix We'll start on Demadex 40 mg by mouth once a day Since patient is asymptomatic will hold dialysis.   LOS: 2 days   Orvel Cutsforth S 09/08/2016,7:45 AM

## 2016-09-08 NOTE — Clinical Social Work Note (Signed)
CSW received consult for wheelchair. CM notified. CSW signing off.  [name], [name]

## 2016-09-08 NOTE — Progress Notes (Signed)
PT glucose was 89. PT given OJ and graham crackers and peanut butter. PT states his head is "spinning". Continue to monitor. PT not to be covered with insulin until 0700 09/09/16.

## 2016-09-08 NOTE — Care Management Important Message (Signed)
Important Message  Patient Details  Name: ANEIL ANGELINI MRN: [id number] Date of Birth: 1941-03-04   Medicare Important Message Given:  Yes    Marq Rebello, Chauncey [place], RN 09/08/2016, 3:20 PM

## 2016-09-09 LAB — RENAL FUNCTION PANEL
ALBUMIN: 2.3 g/dL — AB (ref 3.5–5.0)
Anion gap: 12 (ref 5–15)
BUN: 104 mg/dL — AB (ref 6–20)
CHLORIDE: 94 mmol/L — AB (ref 101–111)
CO2: 28 mmol/L (ref 22–32)
Calcium: 8 mg/dL — ABNORMAL LOW (ref 8.9–10.3)
Creatinine, Ser: 3.08 mg/dL — ABNORMAL HIGH (ref 0.61–1.24)
GFR calc Af Amer: 21 mL/min — ABNORMAL LOW (ref >60)
GFR calc non Af Amer: 18 mL/min — ABNORMAL LOW (ref >60)
GLUCOSE: 84 mg/dL (ref 65–99)
POTASSIUM: 3.1 mmol/L — AB (ref 3.5–5.1)
Phosphorus: 4.8 mg/dL — ABNORMAL HIGH (ref 2.5–4.6)
Sodium: 134 mmol/L — ABNORMAL LOW (ref 135–145)

## 2016-09-09 LAB — GLUCOSE, CAPILLARY
GLUCOSE-CAPILLARY: 163 mg/dL — AB (ref 65–99)
GLUCOSE-CAPILLARY: 261 mg/dL — AB (ref 65–99)
Glucose-Capillary: 101 mg/dL — ABNORMAL HIGH (ref 65–99)
Glucose-Capillary: 83 mg/dL (ref 65–99)

## 2016-09-09 MED ORDER — POTASSIUM CHLORIDE CRYS ER 20 MEQ PO TBCR
40.0000 meq | EXTENDED_RELEASE_TABLET | Freq: Two times a day (BID) | ORAL | Status: DC
  Administered 2016-09-09: 40 meq via ORAL
  Filled 2016-09-09: qty 2

## 2016-09-09 MED ORDER — LEVEMIR FLEXTOUCH 100 UNIT/ML ~~LOC~~ SOPN: 36.0000 [IU] | PEN_INJECTOR | Freq: Every day | SUBCUTANEOUS | 11 refills | Status: AC

## 2016-09-09 MED ORDER — FUROSEMIDE 20 MG PO TABS: 100.0000 mg | ORAL_TABLET | Freq: Every day | ORAL | 0 refills | Status: AC

## 2016-09-09 MED ORDER — FUROSEMIDE 80 MG PO TABS
100.0000 mg | ORAL_TABLET | Freq: Every day | ORAL | Status: DC
  Administered 2016-09-09: 09:00:00 100 mg via ORAL
  Filled 2016-09-09: qty 1

## 2016-09-09 MED ORDER — PRO-STAT SUGAR FREE PO LIQD: 30.0000 mL | Freq: Three times a day (TID) | ORAL | 0 refills | Status: AC

## 2016-09-09 NOTE — Progress Notes (Addendum)
Subjective: Interval History: Patient feels better. He denies any nausea or vomiting. Also he denies significant difficulty breathing  Objective: Vital signs in last 24 hours: Temp:  [97.7 F (36.5 C)-98.5 F (36.9 C)] 98.5 F (36.9 C) (12/19 0534) Pulse Rate:  [77-102] 89 (12/19 0534) Resp:  [18] 18 (12/19 0534) BP: (108-121)/(51-57) 114/53 (12/19 0534) SpO2:  [97 %-100 %] 100 % (12/19 0534) Weight:  [102.6 kg (226 lb 1.6 oz)] 102.6 kg (226 lb 1.6 oz) (12/19 0534) Weight change: -0.318 kg (-11.2 oz)  Intake/Output from previous day: 12/18 0701 - 12/19 0700 In: 626 [P.O.:620; I.V.:6] Out: 750 [Urine:750] Intake/Output this shift: No intake/output data recorded.  General appearance: alert, cooperative and no distress Resp: diminished breath sounds bilaterally Cardio: regular rate and rhythm GI: Distended, nontender Extremities: edema He has 2+ edema  Lab Results:  Recent Labs  09/08/16 0450  WBC 6.7  HGB 10.7*  HCT 32.2*  PLT 264   BMET:   Recent Labs  09/08/16 0450 09/09/16 0433  NA 134* 134*  K 3.7 3.1*  CL 92* 94*  CO2 30 28  GLUCOSE 212* 84  BUN 101* 104*  CREATININE 3.16* 3.08*  CALCIUM 8.1* 8.0*   No results for input(s): PTH in the last 72 hours. Iron Studies: No results for input(s): IRON, TIBC, TRANSFERRIN, FERRITIN in the last 72 hours.  Studies/Results: No results found.  I have reviewed the patient's current medications.  Assessment/Plan: Problem #1 anasarca: Presently patient on Lasix and his non-oliguric. Patient is SOME edema denies any difficulty breathing.  Problem #2 acute kidney injury superimposed on chronic: Possibly secondary to cardiorenal. His creatinine seems to be improving. Patient at this moment doesn't have any uremic signs and  symptoms. Patient with hypokalemia. Most likely from diuretics. Problem #3 hypertension: His blood pressure is reasonably controlled Problem #4 history of ischemic cardiomyopathy with low ejection  fraction Problem #5 metabolic bone disease: His calcium and phosphorus is range Problem #6 anemia: His hemoglobin is 10.7. Presently his hemoglobin is stable. Patient is not also on Epogen. Problem #7 history of coronary artery disease: No chest pain. Problem #8 history of diabetes: Patient blood sugar 84 this morning. He is asymptomatic.  Plan: Patient advised to cut down his salt and fluid intake. 2] we'll give him KCl 40 mEq by mouth 2. 3] we'll increase Lasix 100 mg by mouth daily. Will see patient in the office of his discharge. Patient may require dialysis very soon.   LOS: 3 days   Kalli Greenfield S 09/09/2016,7:38 AM

## 2016-09-09 NOTE — Care Management Note (Signed)
Case Management Note  Patient Details  Name: Chris [name] MRN: [id number] Date of Birth: 12-Feb-1941    Expected Discharge Date:     09/09/2016             Expected Discharge Plan:  [place]  In-House Referral:  Clinical Social Work  Discharge planning Services  CM Consult  Post Acute Care Choice:  Durable Medical Equipment, Home Health, Resumption of Svcs/PTA Provider Choice offered to:  Patient, Spouse  DME Arranged:  Other see comment (rollator) DME Agency:  [place] Arranged:  RN, PT [facility] Agency:  [place]  Status of Service:  Completed, signed off  If discussed at [place] of Stay Meetings, dates discussed:    Additional Comments: Patient discharging home today with resumption of [place] services with [facility]. Patient and wife both decline SNF as recommended by physical therapy. Wife asks for a rollator. [place] notified of discharge and rollator orders and will obtain information from chart.   Ahmod Gillespie, Chauncey [place], RN 09/09/2016, 10:06 AM

## 2016-09-09 NOTE — Discharge Summary (Signed)
Physician Discharge Summary  EDOUARD KORNREICH [id number] DOB: [birth year] DOA: 09/05/2016  PCP: Gar Ponto, MD  Admit date: 09/05/2016 Discharge date: 09/09/2016  Admitted From: Home  Disposition:  Home   Recommendations for Outpatient Follow-up:  1. Follow up with PCP in 1-2 weeks 2. Please obtain BMP/CBC in one week 3. Adjust lasix as needed depending on renal function and volume status.      Discharge Condition: Stable.  CODE STATUS: Full code.  Diet recommendation: Heart Healthy   Brief/Interim Summary: 75 y/o man admitted from home on 12/15 due to anasarca. Believed to have acute CHF and admission requested.  Acute on Chronic Combined CHF -ECHO 10/17 with EF 35-40% and LVH. -Low albumin of 2.8 also contributing to edema. -Renal assisting with diuresis. Received  Lasix  100 BID. -2  L negative since admission. -lasix change to oral.   -stable.   Stage IV CKD -Cr baseline of 2-6-2.8. -Up to 3.10 with diuresis. - cr stable,. Change to oral lasix.  -per nephrology , patient needs close follow up and might need HD soon.   CAD -Stable, no CP.  DM;  Continue with levemir. Wife report poor oral intake.  Will order SSI.   Hypokalemia -Replaced  Bilateral LE wounds -Appreciate wound care recommendations.  Blood culture; Staph coagulase negative; one out of 2. Likely contaminate.    Discharge Diagnoses:  Active Problems:   Poorly controlled type 2 diabetes mellitus (HCC)   Diabetic nephropathy (HCC)   Chronic kidney disease, stage IV (severe) (HCC)   Acute on chronic systolic heart failure, NYHA class 3 (HCC)   Anasarca   Acute exacerbation of CHF (congestive heart failure) [facility])    Discharge Instructions  Discharge Instructions    Diet - low sodium heart healthy    Complete by:  As directed    Increase activity slowly    Complete by:  As directed      Allergies as of 09/09/2016      Reactions   Ciprofloxacin Other (See Comments)    Sores all on legs.    Finasteride    Affected  Patients testicles   Lisinopril Other (See Comments)   Kidney damage   Metoclopramide    Jerking all over   Protonix [pantoprazole Sodium]    abd pain   Xanax [alprazolam] Other (See Comments)   Not in right state of mind.       Medication List    STOP taking these medications   alum & mag hydroxide-simeth 200-200-20 MG/5ML suspension Commonly known as:  MAALOX/MYLANTA   torsemide 20 MG tablet Commonly known as:  DEMADEX     TAKE these medications   atorvastatin 40 MG tablet Commonly known as:  LIPITOR Take 40 mg by mouth daily at 6 PM.   feeding supplement (PRO-STAT SUGAR FREE 64) Liqd Take 30 mLs by mouth 3 (three) times daily.   furosemide 20 MG tablet Commonly known as:  LASIX Take 5 tablets (100 mg total) by mouth daily. Start taking on:  09/10/2016 What changed:  medication strength  how much to take  when to take this   LEVEMIR FLEXTOUCH 100 UNIT/ML Pen Generic drug:  Insulin Detemir Inject 36 Units into the skin daily at 10 pm. What changed:  how much to take  when to take this   LORazepam 0.5 MG tablet Commonly known as:  ATIVAN Take 0.5 mg by mouth 2 (two) times daily as needed for anxiety.   metoprolol succinate 50 MG 24 hr  tablet Commonly known as:  TOPROL-XL Take 1 tablet (50 mg total) by mouth 2 (two) times daily. Take with or immediately following a meal.   multivitamin with minerals Tabs tablet Take 1 tablet by mouth daily.   nitroGLYCERIN 0.4 MG SL tablet Commonly known as:  NITROSTAT Place 1 tablet (0.4 mg total) under the tongue every 5 (five) minutes as needed for chest pain.   ondansetron 4 MG tablet Commonly known as:  ZOFRAN Take 4 mg by mouth every morning. May take every 8 hours as needed for nausea   potassium chloride SA 20 MEQ tablet Commonly known as:  K-DUR,KLOR-CON Take 1 tablet (20 mEq total) by mouth daily.   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   ranitidine 150 MG tablet Commonly known as:  ZANTAC Take 150 mg by mouth 2 (two) times daily as needed for heartburn. Reported on 03/07/2016   triamcinolone cream 0.1 % Commonly known as:  KENALOG APPLY TO AFFECTED AREA DAILY AS DIRECTED What changed:  how much to take  how to take this  when to take this  reasons to take this  additional instructions   Vitamin D 2000 units Caps Take 1 capsule by mouth daily.            Durable Medical Equipment        Start     Ordered   09/08/16 1520  For home use only DME Other see comment  Once    Comments:  rollator   09/08/16 1519     Follow-up Information    [facility] S, MD Follow up in 3 week(s).   Specialty:  Nephrology Contact information: [address] [place] [address] [phone]          Allergies  Allergen Reactions  . Ciprofloxacin Other (See Comments)    Sores all on legs.   . Finasteride     Affected  Patients testicles  . Lisinopril Other (See Comments)    Kidney damage    . Metoclopramide     Jerking all over  . Protonix [Pantoprazole Sodium]     abd pain  . Xanax [Alprazolam] Other (See Comments)    Not in right state of mind.     Consultations:  Nephrology    Procedures/Studies:  No results found.    Subjective: Feeling better, denies dyspnea.   Discharge Exam: Vitals:   09/08/16 2136 09/09/16 0534  BP: (!) 108/51 (!) 114/53  Pulse: 77 89  Resp: 18 18  Temp: 98.3 F (36.8 C) 98.5 F (36.9 C)   Vitals:   09/08/16 0915 09/08/16 1400 09/08/16 2136 09/09/16 0534  BP: (!) 118/56 (!) 121/57 (!) 108/51 (!) 114/53  Pulse: (!) 102 (!) 101 77 89  Resp:  18 18 18   Temp:  97.7 F (36.5 C) 98.3 F (36.8 C) 98.5 F (36.9 C)  TempSrc:  Oral Oral Oral  SpO2:  97% 97% 100%  Weight:    102.6 kg (226 lb 1.6 oz)   Height:        General: Pt is alert, awake, not in acute distress Cardiovascular: RRR, S1/S2 +, no rubs, no gallops Respiratory: CTA bilaterally, no wheezing, no rhonchi Abdominal: Soft, NT, ND, bowel sounds + Extremities: no edema, no cyanosis    The results of significant diagnostics from this hospitalization (including imaging, microbiology, ancillary and laboratory)  are listed below for reference.     Microbiology: Recent Results (from the past 240 hour(s))  Culture, blood (routine x 2)     Status: Abnormal   Collection Time: 09/05/16  7:53 PM  Result Value Ref Range Status   Specimen Description RIGHT ANTECUBITAL  Final   Special Requests BOTTLES DRAWN AEROBIC AND ANAEROBIC [facility] EACH  Final   Culture  Setup Time   Final    GRAM POSITIVE COCCI CRITICAL RESULT CALLED TO, READ BACK BY AND VERIFIED WITH: STOCUM,B. AT 2331 ON 09/06/2016 BY EVA AEROBIC BOTTLE ONLY Performed at [facility]    Culture (A)  Final    STAPHYLOCOCCUS SPECIES (COAGULASE NEGATIVE) THE SIGNIFICANCE OF ISOLATING THIS ORGANISM FROM A SINGLE SET OF BLOOD CULTURES WHEN MULTIPLE SETS ARE DRAWN IS UNCERTAIN. PLEASE NOTIFY THE MICROBIOLOGY DEPARTMENT WITHIN ONE WEEK IF SPECIATION AND SENSITIVITIES ARE REQUIRED. Performed at [facility]    Report Status 09/08/2016 FINAL  Final     Labs: BNP (last 3 results)  Recent Labs  03/28/16 1532 07/13/16 1418 09/05/16 1954  BNP 397.0* 506.0* 0000000*   Basic Metabolic Panel:  Recent Labs Lab 09/05/16 1954 09/06/16 0549 09/07/16 0507 09/08/16 0450 09/09/16 0433  NA 133* 133* 133* 134* 134*  K 3.1* 3.6 3.6 3.7 3.1*  CL 91* 95* 95* 92* 94*  CO2 30 27 27 30 28   GLUCOSE 97 149* 235* 212* 84  BUN 82* 82* 91* 101* 104*  CREATININE 2.73* 2.80* 3.10* 3.16* 3.08*  CALCIUM 8.2* 8.1* 8.1* 8.1* 8.0*  PHOS  --   --   --  4.8* 4.8*   Liver Function Tests:  Recent Labs Lab 09/05/16 1954 09/08/16 0450 09/09/16 0433  AST 26  --   --   ALT 16*   --   --   ALKPHOS 96  --   --   BILITOT 1.2  --   --   PROT 7.7  --   --   ALBUMIN 2.8* 2.4* 2.3*   No results for input(s): LIPASE, AMYLASE in the last 168 hours.  Recent Labs Lab 09/05/16 2016  AMMONIA 20   CBC:  Recent Labs Lab 09/05/16 1954 09/08/16 0450  WBC 8.1 6.7  NEUTROABS 6.6  --   HGB 11.1* 10.7*  HCT 34.1* 32.2*  MCV 105.2* 106.3*  PLT 284 264   Cardiac Enzymes: No results for input(s): CKTOTAL, CKMB, CKMBINDEX, TROPONINI in the last 168 hours. BNP: Invalid input(s): POCBNP CBG:  Recent Labs Lab 09/08/16 2133 09/08/16 2222 09/09/16 0333 09/09/16 0553 09/09/16 0732  GLUCAP 89 87 101* 83 163*   D-Dimer No results for input(s): DDIMER in the last 72 hours. Hgb A1c No results for input(s): HGBA1C in the last 72 hours. Lipid Profile No results for input(s): CHOL, HDL, LDLCALC, TRIG, CHOLHDL, LDLDIRECT in the last 72 hours. Thyroid function studies No results for input(s): TSH, T4TOTAL, T3FREE, THYROIDAB in the last 72 hours.  Invalid input(s): FREET3 Anemia work up No results for input(s): VITAMINB12, FOLATE, FERRITIN, TIBC, IRON, RETICCTPCT in the last 72 hours. Urinalysis    Component Value Date/Time   COLORURINE YELLOW 07/13/2016 [place] 07/13/2016 1355   LABSPEC <1.005 (L) 07/13/2016 1355   PHURINE 6.5 07/13/2016 1355   GLUCOSEU >1000 (A) 07/13/2016 1355   HGBUR NEGATIVE 07/13/2016 1355   BILIRUBINUR NEGATIVE 07/13/2016 1355   KETONESUR NEGATIVE 07/13/2016 1355   PROTEINUR NEGATIVE 07/13/2016 1355   UROBILINOGEN 0.2 10/27/2013 1136   NITRITE NEGATIVE 07/13/2016  [place] 07/13/2016 1355   Sepsis Labs Invalid input(s): PROCALCITONIN,  WBC,  LACTICIDVEN Microbiology Recent Results (from the past 240 hour(s))  Culture, blood (routine x 2)     Status: Abnormal   Collection Time: 09/05/16  7:53 PM  Result Value Ref Range Status   Specimen Description RIGHT ANTECUBITAL  Final   Special Requests  BOTTLES DRAWN AEROBIC AND ANAEROBIC [facility] EACH  Final   Culture  Setup Time   Final    GRAM POSITIVE COCCI CRITICAL RESULT CALLED TO, READ BACK BY AND VERIFIED WITH: STOCUM,B. AT 2331 ON 09/06/2016 BY EVA AEROBIC BOTTLE ONLY Performed at [facility]    Culture (A)  Final    STAPHYLOCOCCUS SPECIES (COAGULASE NEGATIVE) THE SIGNIFICANCE OF ISOLATING THIS ORGANISM FROM A SINGLE SET OF BLOOD CULTURES WHEN MULTIPLE SETS ARE DRAWN IS UNCERTAIN. PLEASE NOTIFY THE MICROBIOLOGY DEPARTMENT WITHIN ONE WEEK IF SPECIATION AND SENSITIVITIES ARE REQUIRED. Performed at [facility]    Report Status 09/08/2016 FINAL  Final     Time coordinating discharge: Over 30 minutes  SIGNED:   Elmarie Shiley, MD  [facility] 09/09/2016, 9:41 AM Pager [phone]  If 7PM-7AM, please contact night-coverage [email or web address] Password [email or web address]

## 2016-09-11 ENCOUNTER — Encounter: Payer: Self-pay | Admitting: *Deleted

## 2016-09-11 ENCOUNTER — Other Ambulatory Visit: Payer: Self-pay | Admitting: *Deleted

## 2016-09-11 NOTE — Patient Outreach (Signed)
Telephone call to patient for transition of care week 1, pt discharged hospital 09/08/16 for CHF exacerbation, spoke with patient's wife Iris who reports home health ([facility]) saw pt yesterday and will be seeing pt several times per week. Pt has no follow up primary care appointment and wife states pt refuses to go to doctor, refuses to take medications today, refuses to check blood sugar (wife checked this morning and reading was near 400 because pt will not take medication), pt refuses to take a shower, refuses to weigh, wife states " I'm at my wits end and don't know what else I can do, he refuses everything"  Wife states hospice had been ordered and pt refused more than once, wife states she would like to try hospice again for evaluation but pt will not agree. RN CM reviewed options for wife such as assistance in the home, different level of care (which pt and wife refuses). Wife states pt needs prostat and she has prescription for this but not sure where to get it.  RN CM called [facility] and they do not have any on hand but can order it and get it the next day. RN CM informed wife of this and she will choose which drug store and take prescription. RN CM ask that wife talk with family members about assisting, some of the family already assists and they have adult children that live nearby.  At present, pt and wife not willing to make any changes or consider another level of care or hospice. RN CM faxed barrier letter and transition of care note to primary care MD Dr. Quillian Quince.  Outpatient Encounter Prescriptions as of 09/11/2016  Medication Sig  . atorvastatin (LIPITOR) 40 MG tablet Take 40 mg by mouth daily at 6 PM.   . Cholecalciferol (VITAMIN D) 2000 units CAPS Take 1 capsule by mouth daily.  . furosemide (LASIX) 20 MG tablet Take 5 tablets (100 mg total) by mouth daily.  Marland Kitchen LEVEMIR FLEXTOUCH 100 UNIT/ML Pen Inject 36 Units into the skin daily at 10 pm.  . LORazepam (ATIVAN) 0.5 MG  tablet Take 0.5 mg by mouth 2 (two) times daily as needed for anxiety.  . metoprolol succinate (TOPROL-XL) 50 MG 24 hr tablet Take 1 tablet (50 mg total) by mouth 2 (two) times daily. Take with or immediately following a meal.  . Multiple Vitamin (MULTIVITAMIN WITH MINERALS) TABS tablet Take 1 tablet by mouth daily.  . nitroGLYCERIN (NITROSTAT) 0.4 MG SL tablet Place 1 tablet (0.4 mg total) under the tongue every 5 (five) minutes as needed for chest pain.  Marland Kitchen ondansetron (ZOFRAN) 4 MG tablet Take 4 mg by mouth every morning. May take every 8 hours as needed for nausea  . potassium chloride SA (K-DUR,KLOR-CON) 20 MEQ tablet Take 1 tablet (20 mEq total) by mouth daily.  . 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  . ranitidine (ZANTAC) 150 MG tablet Take 150 mg by mouth 2 (two) times daily as needed for heartburn. Reported on 03/07/2016  . triamcinolone cream (KENALOG) 0.1 % APPLY TO AFFECTED AREA DAILY AS DIRECTED (Patient taking differently: Apply 1 application topically daily as needed (for rash). )  . Amino Acids-Protein Hydrolys (FEEDING SUPPLEMENT, PRO-STAT SUGAR FREE 64,) LIQD Take 30 mLs by mouth 3 (three) times daily. (Patient not taking: Reported on 09/11/2016)   No facility-administered encounter medications on file as of  09/11/2016.    [facility] CM Care Plan Problem One   Flowsheet Row Most Recent Value  Care Plan Problem One  Knowledge deficit related to CHF  Role Documenting the Problem One  Care Management Coordinator  Care Plan for Problem One  Active  THN Long Term Goal (31-90 days)  Pt will verbalize better understanding of CHF to facilitate better outcomes and reduce hospitalizations within 90 days.  THN Long Term Goal Start Date  07/24/16  Interventions for Problem One Long Term Goal  RN CM reviewed with wife the importance of calling MD early for change in health status,  symptoms, reviewed CHF action plan, talked with wife about changes that could be made with patient's care such as another level of care but pt and wife not ready to make changes at present.  THN CM Short Term Goal #1 (0-30 days)  pt will verbalize CHF zones within 30 days  THN CM Short Term Goal #1 Start Date  08/23/16 [goal restarted]  Interventions for Short Term Goal #1  RN CM reinforced CHF zones/ action plan  THN CM Short Term Goal #2 (0-30 days)  Pt will weigh daily and log within 30 days.  THN CM Short Term Goal #2 Start Date  08/23/16  Interventions for Short Term Goal #2  RN CM reiterated with wife about importance of pt weighing daily, wife states pt not willing to weigh daily at present.    [facility] CM Care Plan Problem Two   Flowsheet Row Most Recent Value  Care Plan Problem Two  Hyperglycemia with Hgb AIC 9.7 on 07/14/16  Role Documenting the Problem Two  Care Management Coordinator  Care Plan for Problem Two  Active  THN CM Short Term Goal #1 (0-30 days)  Pt will log blood sugar daily within 30 days.  THN CM Short Term Goal #1 Start Date  07/24/16  [facility] CM Short Term Goal #1 Met Date   08/26/16  Interventions for Short Term Goal #2   at present, pt refuses to have CBG checked regularly      PLAN Continue weekly transition of care calls  Jacqlyn Larsen [facility], [place] Coordinator [phone]

## 2016-09-17 ENCOUNTER — Encounter: Payer: Self-pay | Admitting: *Deleted

## 2016-09-17 ENCOUNTER — Other Ambulatory Visit: Payer: Self-pay | Admitting: *Deleted

## 2016-09-17 NOTE — Patient Outreach (Signed)
09/16/16-Received voicemail from [employer] at [employer] reporting several concerns- pt has no follow up wound center appointment, no nephrology appointment and no primary care appointment, pt refuses to take lasix '100mg'$  and is taking torsemide, pt still needs to be evaluated for wheelchair. 09/16/16- RN CM called [facility], spoke with Juliette Mangle (in the office) and reported above concerns,Jennifer states PT will evaluate pt by tomorrow for the wheelchair, RN is providing oversight with wound care, silvadene and wraps to both legs and wife is assisting with daily wound care.  Pt does have rollator walker.  Anderson Malta states pt refused a home visit on Saturday 09/13/16.  RN CM called Crystal at [employer] to update and no answer to telephone, left voicemail with update and requested return phone call. 09/17/16-Telephone call to patient for transition of care week 2, spoke with wife, HIPAA verified, wife reports PT evaluated pt for power wheelchair yesterday and patient's son bought a standard wheelchair for pt and PT demonstrated with family on how to use.  Wife reports pt is refusing to weigh, CBG 270 today, refuses to take lasix '100mg'$  and is taking torsemide 80 mg.  Wife states pt has appointment with wound center tomorrow, wife is calling to make follow up appointment with nephrology for 3 week post hospital follow up, wife states she is making primary MD follow up appointment but will need to consult with her children because they will be assisting with transportation.  RN CM called Simona Huh RN case manager with [facility] and gave update on the above information,  RN CM faxed note to primary MD Dr. Quillian Quince and informed pt is not taking lasix and is taking torsemide.  [facility] CM Care Plan Problem One   Flowsheet Row Most Recent Value  Care Plan Problem One  Knowledge deficit related to CHF  Role Documenting the Problem One  Care Management Coordinator  Care Plan for Problem One  Active  THN Long  Term Goal (31-90 days)  Pt will verbalize better understanding of CHF to facilitate better outcomes and reduce hospitalizations within 90 days.  THN Long Term Goal Start Date  09/11/16 [goal restarted]  Interventions for Problem One Long Term Goal  RN CM reviewed medications and CHF action plan/ zones  THN CM Short Term Goal #1 (0-30 days)  pt will verbalize CHF zones within 30 days  THN CM Short Term Goal #1 Start Date  08/23/16 [goal restarted]  Interventions for Short Term Goal #1  RN CM reinterated CHF zones, calling early for change in health status, symptoms  THN CM Short Term Goal #2 (0-30 days)  Pt will weigh daily and log within 30 days.  THN CM Short Term Goal #2 Start Date  08/23/16  [facility] CM Short Term Goal #2 Met Date  09/17/16  Interventions for Short Term Goal #2  RN CM talked with wife about daily weights and pt is not willing to work towards this goal    [facility] CM Care Plan Problem Two   Flowsheet Row Most Recent Value  Care Plan Problem Two  Hyperglycemia with Hgb AIC 9.7 on 07/14/16  Role Documenting the Problem Two  Care Management Coordinator  Care Plan for Problem Two  Active  THN CM Short Term Goal #1 (0-30 days)  Pt will log blood sugar daily within 30 days.  THN CM Short Term Goal #1 Start Date  07/24/16  [facility] CM Short Term Goal #1 Met Date   08/26/16      PLAN Continue  weekly transition of care See pt for home visit next week  Jacqlyn Larsen [facility], [place] Coordinator [phone]

## 2016-09-18 ENCOUNTER — Ambulatory Visit: Payer: Self-pay | Admitting: *Deleted

## 2016-09-22 DEATH — deceased

## 2016-09-23 ENCOUNTER — Other Ambulatory Visit: Payer: Self-pay | Admitting: *Deleted

## 2016-09-23 ENCOUNTER — Ambulatory Visit: Payer: Self-pay | Admitting: *Deleted

## 2016-09-23 NOTE — Patient Outreach (Signed)
Telephone call with patient's wife Versie Pierre who reported pt admitted to hospice home and passed away on 10/20/2016, wife states primary MD has been notified.  Jacqlyn Larsen [facility], [place] Coordinator [phone]
